# Patient Record
Sex: Male | Born: 1940
Health system: Southern US, Community
[De-identification: ages and names within clinical notes are randomized; demographics above are authoritative.]

## PROBLEM LIST (undated history)

## (undated) DIAGNOSIS — M5126 Other intervertebral disc displacement, lumbar region: Secondary | ICD-10-CM

## (undated) DIAGNOSIS — I499 Cardiac arrhythmia, unspecified: Secondary | ICD-10-CM

## (undated) DIAGNOSIS — H532 Diplopia: Secondary | ICD-10-CM

## (undated) DIAGNOSIS — Z8546 Personal history of malignant neoplasm of prostate: Secondary | ICD-10-CM

## (undated) DIAGNOSIS — M51369 Other intervertebral disc degeneration, lumbar region without mention of lumbar back pain or lower extremity pain: Secondary | ICD-10-CM

## (undated) DIAGNOSIS — G4730 Sleep apnea, unspecified: Secondary | ICD-10-CM

## (undated) DIAGNOSIS — I10 Essential (primary) hypertension: Secondary | ICD-10-CM

## (undated) DIAGNOSIS — N200 Calculus of kidney: Secondary | ICD-10-CM

## (undated) DIAGNOSIS — M545 Low back pain, unspecified: Secondary | ICD-10-CM

## (undated) DIAGNOSIS — M5136 Other intervertebral disc degeneration, lumbar region: Secondary | ICD-10-CM

## (undated) DIAGNOSIS — C801 Malignant (primary) neoplasm, unspecified: Secondary | ICD-10-CM

## (undated) DIAGNOSIS — M5030 Other cervical disc degeneration, unspecified cervical region: Secondary | ICD-10-CM

## (undated) DIAGNOSIS — F0390 Unspecified dementia without behavioral disturbance: Secondary | ICD-10-CM

## (undated) DIAGNOSIS — I4891 Unspecified atrial fibrillation: Secondary | ICD-10-CM

## (undated) DIAGNOSIS — M1990 Unspecified osteoarthritis, unspecified site: Secondary | ICD-10-CM

## (undated) DIAGNOSIS — E11319 Type 2 diabetes mellitus with unspecified diabetic retinopathy without macular edema: Secondary | ICD-10-CM

## (undated) HISTORY — PX: COLONOSCOPY: SHX174

## (undated) HISTORY — PX: BACK SURGERY: SHX140

## (undated) HISTORY — PX: LITHOTRIPSY: SUR834

## (undated) HISTORY — PX: OTHER SURGICAL HISTORY: SHX169

## (undated) HISTORY — PX: APPENDECTOMY: SHX54

## (undated) HISTORY — PX: CYSTOSCOPY: SUR368

---

## 2005-06-15 ENCOUNTER — Ambulatory Visit (HOSPITAL_COMMUNITY): Admission: RE | Admit: 2005-06-15 | Discharge: 2005-06-15 | Payer: Self-pay | Admitting: Family Medicine

## 2005-07-03 ENCOUNTER — Ambulatory Visit (HOSPITAL_BASED_OUTPATIENT_CLINIC_OR_DEPARTMENT_OTHER): Admission: RE | Admit: 2005-07-03 | Discharge: 2005-07-03 | Payer: Self-pay | Admitting: Orthopedic Surgery

## 2005-08-03 ENCOUNTER — Ambulatory Visit (HOSPITAL_COMMUNITY): Admission: RE | Admit: 2005-08-03 | Discharge: 2005-08-03 | Payer: Self-pay | Admitting: Urology

## 2006-06-11 ENCOUNTER — Emergency Department (HOSPITAL_COMMUNITY): Admission: EM | Admit: 2006-06-11 | Discharge: 2006-06-11 | Payer: Self-pay | Admitting: Emergency Medicine

## 2007-10-04 ENCOUNTER — Emergency Department (HOSPITAL_COMMUNITY): Admission: EM | Admit: 2007-10-04 | Discharge: 2007-10-04 | Payer: Self-pay | Admitting: Emergency Medicine

## 2008-06-26 ENCOUNTER — Encounter: Payer: Self-pay | Admitting: Family Medicine

## 2008-06-26 ENCOUNTER — Emergency Department (HOSPITAL_COMMUNITY): Admission: EM | Admit: 2008-06-26 | Discharge: 2008-06-26 | Payer: Self-pay | Admitting: Emergency Medicine

## 2008-07-13 ENCOUNTER — Inpatient Hospital Stay (HOSPITAL_COMMUNITY): Admission: RE | Admit: 2008-07-13 | Discharge: 2008-07-16 | Payer: Self-pay | Admitting: Neurosurgery

## 2008-07-14 HISTORY — PX: OTHER SURGICAL HISTORY: SHX169

## 2008-11-04 ENCOUNTER — Ambulatory Visit (HOSPITAL_COMMUNITY): Admission: RE | Admit: 2008-11-04 | Discharge: 2008-11-04 | Payer: Self-pay | Admitting: Neurosurgery

## 2008-11-12 HISTORY — PX: OTHER SURGICAL HISTORY: SHX169

## 2008-11-18 ENCOUNTER — Inpatient Hospital Stay (HOSPITAL_COMMUNITY): Admission: RE | Admit: 2008-11-18 | Discharge: 2008-11-20 | Payer: Self-pay | Admitting: Neurosurgery

## 2009-01-04 ENCOUNTER — Ambulatory Visit (HOSPITAL_COMMUNITY): Admission: RE | Admit: 2009-01-04 | Discharge: 2009-01-04 | Payer: Self-pay | Admitting: Ophthalmology

## 2009-01-04 HISTORY — PX: OTHER SURGICAL HISTORY: SHX169

## 2009-09-25 ENCOUNTER — Emergency Department (HOSPITAL_COMMUNITY): Admission: EM | Admit: 2009-09-25 | Discharge: 2009-09-25 | Payer: Self-pay | Admitting: Emergency Medicine

## 2009-11-29 ENCOUNTER — Ambulatory Visit (HOSPITAL_COMMUNITY): Admission: RE | Admit: 2009-11-29 | Discharge: 2009-11-29 | Payer: Self-pay | Admitting: Otolaryngology

## 2010-06-05 ENCOUNTER — Encounter: Payer: Self-pay | Admitting: Diagnostic Neuroimaging

## 2010-06-05 ENCOUNTER — Encounter: Payer: Self-pay | Admitting: Family Medicine

## 2010-07-30 LAB — CREATININE, SERUM
Creatinine, Ser: 1.26 mg/dL (ref 0.4–1.5)
GFR calc Af Amer: >60 mL/min (ref >60)
GFR calc non Af Amer: 57 mL/min — ABNORMAL LOW (ref >60)

## 2010-07-30 LAB — BUN: BUN: 20 mg/dL (ref 6–23)

## 2010-08-02 LAB — DIFFERENTIAL
Basophils Absolute: 0 10*3/uL (ref 0.0–0.1)
Monocytes Absolute: 0.6 10*3/uL (ref 0.1–1.0)
Monocytes Relative: 8 % (ref 3–12)
Neutrophils Relative %: 68 % (ref 43–77)

## 2010-08-02 LAB — CBC
HCT: 37.5 % — ABNORMAL LOW (ref 39.0–52.0)
Hemoglobin: 13.2 g/dL (ref 13.0–17.0)
MCHC: 35.2 g/dL (ref 30.0–36.0)
Platelets: 228 10*3/uL (ref 150–400)
RBC: 4.22 MIL/uL (ref 4.22–5.81)
RDW: 12.9 % (ref 11.5–15.5)

## 2010-08-02 LAB — BASIC METABOLIC PANEL
GFR calc Af Amer: >60 mL/min (ref >60)
GFR calc non Af Amer: >60 mL/min (ref >60)
Glucose, Bld: 233 mg/dL — ABNORMAL HIGH (ref 70–99)
Potassium: 3.8 mEq/L (ref 3.5–5.1)

## 2010-08-20 LAB — URINALYSIS, ROUTINE W REFLEX MICROSCOPIC
Ketones, ur: NEGATIVE mg/dL
Nitrite: NEGATIVE
Protein, ur: NEGATIVE mg/dL
Specific Gravity, Urine: 1.025 (ref 1.005–1.030)
Urobilinogen, UA: 0.2 mg/dL (ref 0.0–1.0)

## 2010-08-20 LAB — GLUCOSE, CAPILLARY
Glucose-Capillary: 102 mg/dL — ABNORMAL HIGH (ref 70–99)
Glucose-Capillary: 117 mg/dL — ABNORMAL HIGH (ref 70–99)

## 2010-08-20 LAB — COMPREHENSIVE METABOLIC PANEL
AST: 21 U/L (ref 0–37)
Albumin: 3.7 g/dL (ref 3.5–5.2)
Calcium: 9.5 mg/dL (ref 8.4–10.5)
Chloride: 104 mEq/L (ref 96–112)
GFR calc Af Amer: >60 mL/min (ref >60)
GFR calc non Af Amer: 60 mL/min — ABNORMAL LOW (ref >60)
Glucose, Bld: 115 mg/dL — ABNORMAL HIGH (ref 70–99)
Potassium: 4.2 mEq/L (ref 3.5–5.1)
Total Bilirubin: 0.5 mg/dL (ref 0.3–1.2)

## 2010-08-20 LAB — CBC
MCHC: 34.5 g/dL (ref 30.0–36.0)
RDW: 12.8 % (ref 11.5–15.5)

## 2010-08-21 LAB — POCT I-STAT GLUCOSE
Glucose, Bld: 94 mg/dL (ref 70–99)
Glucose, Bld: 99 mg/dL (ref 70–99)
Operator id: 156951
Operator id: 156951

## 2010-08-21 LAB — GLUCOSE, CAPILLARY
Glucose-Capillary: 131 mg/dL — ABNORMAL HIGH (ref 70–99)
Glucose-Capillary: 133 mg/dL — ABNORMAL HIGH (ref 70–99)
Glucose-Capillary: 144 mg/dL — ABNORMAL HIGH (ref 70–99)
Glucose-Capillary: 146 mg/dL — ABNORMAL HIGH (ref 70–99)
Glucose-Capillary: 147 mg/dL — ABNORMAL HIGH (ref 70–99)
Glucose-Capillary: 163 mg/dL — ABNORMAL HIGH (ref 70–99)
Glucose-Capillary: 199 mg/dL — ABNORMAL HIGH (ref 70–99)
Glucose-Capillary: 209 mg/dL — ABNORMAL HIGH (ref 70–99)

## 2010-08-21 LAB — BASIC METABOLIC PANEL
BUN: 24 mg/dL — ABNORMAL HIGH (ref 6–23)
CO2: 29 mEq/L (ref 19–32)
Calcium: 9.2 mg/dL (ref 8.4–10.5)
Chloride: 104 mEq/L (ref 96–112)
Creatinine, Ser: 1.37 mg/dL (ref 0.4–1.5)
GFR calc Af Amer: >60 mL/min (ref >60)
GFR calc non Af Amer: 52 mL/min — ABNORMAL LOW (ref >60)
Glucose, Bld: 240 mg/dL — ABNORMAL HIGH (ref 70–99)
Potassium: 4.7 mEq/L (ref 3.5–5.1)
Sodium: 140 mEq/L (ref 135–145)

## 2010-08-21 LAB — TYPE AND SCREEN
ABO/RH(D): O POS
Antibody Screen: NEGATIVE

## 2010-08-21 LAB — CBC
HCT: 36.4 % — ABNORMAL LOW (ref 39.0–52.0)
Hemoglobin: 12.5 g/dL — ABNORMAL LOW (ref 13.0–17.0)
MCHC: 34.4 g/dL (ref 30.0–36.0)
MCV: 91.5 fL (ref 78.0–100.0)
Platelets: 213 10*3/uL (ref 150–400)
RBC: 3.97 MIL/uL — ABNORMAL LOW (ref 4.22–5.81)
RDW: 12.5 % (ref 11.5–15.5)
WBC: 7.7 10*3/uL (ref 4.0–10.5)

## 2010-08-21 LAB — ABO/RH: ABO/RH(D): O POS

## 2010-08-22 LAB — CREATININE, SERUM
Creatinine, Ser: 1.15 mg/dL (ref 0.4–1.5)
GFR calc Af Amer: >60 mL/min (ref >60)
GFR calc non Af Amer: >60 mL/min (ref >60)

## 2010-08-22 LAB — BUN: BUN: 19 mg/dL (ref 6–23)

## 2010-08-25 LAB — COMPREHENSIVE METABOLIC PANEL
ALT: 17 U/L (ref 0–53)
AST: 20 U/L (ref 0–37)
Albumin: 3.9 g/dL (ref 3.5–5.2)
Alkaline Phosphatase: 52 U/L (ref 39–117)
BUN: 14 mg/dL (ref 6–23)
CO2: 27 mEq/L (ref 19–32)
Calcium: 9.2 mg/dL (ref 8.4–10.5)
Chloride: 103 mEq/L (ref 96–112)
Creatinine, Ser: 0.99 mg/dL (ref 0.4–1.5)
GFR calc Af Amer: >60 mL/min (ref >60)
GFR calc non Af Amer: >60 mL/min (ref >60)
Glucose, Bld: 177 mg/dL — ABNORMAL HIGH (ref 70–99)
Potassium: 3.7 mEq/L (ref 3.5–5.1)
Sodium: 139 mEq/L (ref 135–145)
Total Bilirubin: 0.4 mg/dL (ref 0.3–1.2)
Total Protein: 6.9 g/dL (ref 6.0–8.3)

## 2010-08-25 LAB — GLUCOSE, CAPILLARY
Glucose-Capillary: 100 mg/dL — ABNORMAL HIGH (ref 70–99)
Glucose-Capillary: 117 mg/dL — ABNORMAL HIGH (ref 70–99)
Glucose-Capillary: 122 mg/dL — ABNORMAL HIGH (ref 70–99)
Glucose-Capillary: 123 mg/dL — ABNORMAL HIGH (ref 70–99)
Glucose-Capillary: 127 mg/dL — ABNORMAL HIGH (ref 70–99)
Glucose-Capillary: 178 mg/dL — ABNORMAL HIGH (ref 70–99)
Glucose-Capillary: 179 mg/dL — ABNORMAL HIGH (ref 70–99)
Glucose-Capillary: 232 mg/dL — ABNORMAL HIGH (ref 70–99)
Glucose-Capillary: 262 mg/dL — ABNORMAL HIGH (ref 70–99)
Glucose-Capillary: 326 mg/dL — ABNORMAL HIGH (ref 70–99)
Glucose-Capillary: 331 mg/dL — ABNORMAL HIGH (ref 70–99)
Glucose-Capillary: 83 mg/dL (ref 70–99)

## 2010-08-25 LAB — CBC
HCT: 37.5 % — ABNORMAL LOW (ref 39.0–52.0)
Hemoglobin: 12.9 g/dL — ABNORMAL LOW (ref 13.0–17.0)
MCHC: 34.3 g/dL (ref 30.0–36.0)
MCV: 90.6 fL (ref 78.0–100.0)
Platelets: 216 10*3/uL (ref 150–400)
RBC: 4.14 MIL/uL — ABNORMAL LOW (ref 4.22–5.81)
RDW: 12.7 % (ref 11.5–15.5)
WBC: 7 10*3/uL (ref 4.0–10.5)

## 2010-08-30 LAB — GLUCOSE, CAPILLARY: Glucose-Capillary: 119 mg/dL — ABNORMAL HIGH (ref 70–99)

## 2010-09-27 NOTE — Discharge Summary (Signed)
Jonathan Hensley, MISH NO.:  [id number]   MEDICAL RECORD NO.:  [id number]          PATIENT TYPE:  INP   LOCATION:  3006                         FACILITY:  [facility]   PHYSICIAN:  Hewitt Shorts, M.D.DATE OF BIRTH:  04-14-1941   DATE OF ADMISSION:  07/13/2008  DATE OF DISCHARGE:  07/16/2008                               DISCHARGE SUMMARY   This is the patient that was admitted on July 13, 2008, at 2200 for a  left L4-L5 herniated nucleus pulposus.  He underwent a left L4-L5  laminotomy and microdiskectomy that same evening.  The patient had a  small dural disruption, which has kept him in the hospital an additional  2 days for healing and safety issues.   The patient has had an uneventful hospitalization during his  recuperating period.  He has remained flat since his surgery at 2200 on  the first until today when I saw him this morning on morning rounds.  I  did allow them to raise the head of the bed to 30 degrees for 30  minutes, then 60 degrees for 30 minutes, then 90 degrees, and then sit  up with assist.  He has done well with that and then ambulating.  He  states he had nausea at first but that passed, and he has not had any  since.   He has had positive p.o. intake today.  He is voiding after his catheter  was removed this morning without difficulty.  He is able to ambulate  without assist at this time.   His wound is closed with sutures.  It is clean, dry, and intact, and  open to air at this time.  He will follow up in our office on the 10th  at 9 o'clock for suture removal with myself.   The patient will be discharged to home in good condition.   DISCHARGE DIAGNOSIS:  Left L4-L5 herniated nucleus pulposus.   DISCHARGE PRESCRIPTION:  Percocet 5/325 mg 1-2 p.o. q.4-6 h. p.r.n.  pain.  I gave him 50 with no refills.  We will follow him up on March 10  for suture removal at 0900.      Russell L. Webb Silversmith, RN      Hewitt Shorts, M.D.  Electronically Signed    RLW/MEDQ  D:  07/16/2008  T:  07/17/2008  Job:  [id number]

## 2010-09-27 NOTE — Op Note (Signed)
NAME:  Jonathan Hensley, Jonathan Hensley                ACCOUNT NO.:  [id number]   MEDICAL RECORD NO.:  [id number]          PATIENT TYPE:  AMB   LOCATION:  SDS                          FACILITY:  [facility]   PHYSICIAN:  Lanna Poche, M.D. DATE OF BIRTH:  Feb 18, 1941   DATE OF PROCEDURE:  01/04/2009  DATE OF DISCHARGE:  01/04/2009                               OPERATIVE REPORT   PREOPERATIVE DIAGNOSIS:  Chronic rhegmatogenous retinal detachment,  right eye.   POSTOPERATIVE DIAGNOSIS:  Chronic rhegmatogenous retinal detachment,  right eye.   PROCEDURE:  Scleral buckle, laser photocoagulation, and anterior chamber  paracentesis, right eye.   SURGEON.:  Lanna Poche, MD   ANESTHESIA:  General endotracheal.   ASSISTANT:  Clayton Bibles, OA   ESTIMATED BLOOD LOSS:  Less than 1 mL.   COMPLICATIONS:  None.   OPERATIVE NOTE:  The patient was taken to the operating room.  After  induction of general anesthesia, the right eye was prepped and draped in  usual fashion.  The lid speculum was introduced in conjunctival fornix  posterior to the limbus.  The four rectus muscles isolated and  __________ on suture of 2-0 silk and the four quadrants were inspected  and found to be free of defects.  The lid speculum was left in and  inspection with ophthalmoscope and scleral depression revealed there to  be a chronic peripheral retinal detachment extending from approximately  8 o'clock to 12:30.  There were atrophic holes at 9 and 9:30, there was  a large peripheral horseshoe tear approximately 10 and with some  additional atrophic holes at 12.  The holes were all medial and  posterior to the ora serrata.  The lid speculum was then removed and the  lid sutures of 4-0 silk were placed.  The 240 band then passed 360  degrees underneath the recti muscles and joined the superonasal quadrant  at 270 sleeve.  277 buckle was brought on to the table, cut to the  appropriate length, and passed underneath the lateral and  superior recti  muscles.  The buckle and hardware anchored with sutures of 5-0  Mersilene, the superonasal, inferonasal, and inferotemporal quadrants.  The supratemporal sclerotomy had two 4-0 Mersilene mattress sutures  placed.  The inside of the eye was inspected.  There was good support of  the retinal breaks.  There was good laser reaction where scleral  depression and indirect laser was used to perform laser photocoagulation  around the areas of the retinal breaks.  The knots were then tied up and  rotated posteriorly.  Inspection showed there to be good support of the  buckle with additional indentation achieved by tightening the band  slightly before the band was trimmed.  The ophthalmic artery was  visualized and found to be patent, but with small amounts of pulsation.  The conjunctiva was then drawn and reapproximated with interrupted  running sutures of 6-0 plain gut after all the traction sutures were  removed.  Inspection internally still showed there to be some pulsation  of the ophthalmic artery, so anterior chamber paracentesis was performed  removing  approximately 0.2 mL of fluid through the 30-gauge needle at 12  o'clock position over the iris.  The eye was reinspected and found to be  patent artery.  The subconjunctival space was then irrigated with 0.75%  Marcaine followed by mixed antibiotic solution.  The subconjunctival  space base was then injected with 100 mg of  ceftazidime and 10 mg of Decadron.  Lid speculum was then removed and  mixed antibiotic was applied to the surface of the eye.  Eye patch and  shield was then placed over the patient's right eye.  The patient  tolerated the procedure well.           ______________________________  Lanna Poche, M.D.     JTH/MEDQ  D:  01/04/2009  T:  01/05/2009  Job:  [id number]

## 2010-09-27 NOTE — Op Note (Signed)
Jonathan Hensley, Jonathan Hensley                ACCOUNT NO.:  [id number]   MEDICAL RECORD NO.:  [id number]          PATIENT TYPE:  INP   LOCATION:  3014                         FACILITY:  [facility]   PHYSICIAN:  Hewitt Shorts, M.D.DATE OF BIRTH:  12-25-1940   DATE OF PROCEDURE:  11/18/2008  DATE OF DISCHARGE:                               OPERATIVE REPORT   PREOPERATIVE DIAGNOSES:  Recurrent left L4-5 lumbar disk herniation,  lumbar degenerative disk disease, lumbar spondylosis, lumbar  radiculopathy and lumbar instability.   POSTOPERATIVE DIAGNOSES:  Recurrent left L4-5 lumbar disk herniation,  lumbar degenerative disk disease, lumbar spondylosis, lumbar  radiculopathy and lumbar instability.   PROCEDURE:  Bilateral L4-5 lumbar decompression including bilateral  laminotomy, facetectomy, foraminotomy, and microdiskectomy with  microdissection with decompression beyond that required for interbody  arthrodesis and bilateral L4-5 posterior lumbar interbody fusion with  AVS PEEK interbody implants with mosaic with bone marrow aspirate and  bilateral L4-5 posterolateral arthrodesis with radius posterior  instrumentation and mosaic with bone marrow aspirates.   SURGEON:  Hewitt Shorts, MD   ASSISTANT:  Lauree Chandler, NP and Clydene Fake, MD   ANESTHESIA:  General endotracheal.   INDICATIONS:  The patient is a 70 year old man who is 4 months status  post left L4-5 lumbar laminotomy and microdiskectomy.  He did well  initially, but suffered a large recurrent disk herniation with recurrent  radiculopathy and because of the early recurrence and very large disk  herniation, a decision was made to proceed with both decompression and  stabilization.   Of note, the patient did have a small dural rent at the time of his  first surgery that was patched over with DuraSeal and during the  surgery, no CSF leakage was encountered.   PROCEDURE:  The patient was brought to the operating room and  placed  under general endotracheal anesthesia.  The patient was turned to a  prone position.  Lumbar region was prepped with Betadine soap and  solution, and draped in a sterile fashion.  The midline was infiltrated  with local anesthetic with epinephrine and then midline incision was  made through the previous midline incision extending both rostrally and  caudally.  Dissection was carried down through the subcutaneous tissue  to the lumbar fascia.  Bipolar cautery and electrocautery were used to  maintain hemostasis.  Dissection was first carried out and incised in  the fascia on the right side dissecting the paraspinal muscles from the  spinous process and lamina using a subperiosteal technique.  We then  approached from the left side, area of the scar at the L4-5 level was  noted and careful dissection was carried down through that area.  The  paraspinal muscles were dissected in periosteal fashion where good body  surface was noted.  A Self-retaining retractors were placed, and x-ray  taken to confirm localization at the L4-5 level.   Decompression performed using magnification using microdissection and  microsurgical technique.  We first performed a laminotomy, facetectomy  on the right side using the high speed drill and Kerrison punches.  Ligamentum flavum was carefully  removed and the thecal sac and exiting  L5 nerve root were identified.  We then exposed on the left side second-  degree scar tissues from the margins of the laminotomy.  The laminotomy  was then extended rostrally and laterally and medial facetectomy was  performed as well.   We were able to carefully dissect the left lateral epidural space  exposing the lateral margins of the dura, and the large disk herniation.  We began to visualize the disk herniation and began to mobilize it and  the disk herniation itself was very large and was removed in a piecemeal  fashion removing several very large fragments and then  removing small  fragments as well.  All loose fragments of disk material that herniated  within the epidural space were removed with good decompression of the  thecal sac and exiting left L5 nerve root.  No evidence of CSF leakage  was noted nor evidence of dural tear or rent.   We entered into the disk space and continued the diskectomy using a  variety of microcurettes and pituitary rongeurs, and a thorough  diskectomy was performed with removal of all disk fragments and disk  material.   We then approached from the right side coagulating epidural veins over  the annulus and dividing them, and then incising the annulus and  entering into the disk space and continuing the diskectomy again using a  variety of  microcurettes and pituitary rongeurs.   We then prepared the endplates of the vertebral body surfaces  bilaterally using paddle curettes to remove the cartilaginous surface  down to a good bony surface.  We measured the height of the  intravertebral disk space and selected two 10 mm x 25 mm x 4 degree AVS  PEEK implants.   A C-arm fluoroscope was then draped and brought into the field and  guided [place] in probing the right L5 pedicle.  We were able to aspirate  bone marrow aspirate which was injected over a 15-mL strip of mosaic.   We then packed each of the PEEK implants with mosaic with bone marrow  aspirate.  Once these were prepared, we placed the first implant on the  right side carefully retracting the thecal sac and nerve root  immediately.  The  implant was placed in the intravertebral disk space  and countersunk.  We then approached from the left side, packed in  midline with the mosaic with bone marrow aspirate and then placed a  second implant from the left side.  Again, carefully retracting the  thecal sac and nerve root and again the implant with countersunk.   We then packed additional mosaic with bone marrow aspirate lateral to  the each of the implants.  Once  the interbody arthrodesis was completed,  we proceeded with posterolateral arthrodesis.   Again, using C-arm fluoroscopic guidance, each of the pedicles were  probed bilaterally at L4 as well as on the left side at L5.  All 4 of  the pedicle probes were examined with the ball probe, good bony surfaces  were noted.  No cutouts were found.  Each of the pedicles was tapped to  the 5.25 mm tap, and again examined with a ball probe.  Again, good  threading was noted and good bony surfaces without cutouts were noted.  We then placed 45 mm x 5.75 mm polyaxial screws with C-arm fluoroscopic  guidance.   Once all 4 screws were placed, we selected prelordosed rods using a 40-  mm rod on the left and 35-mm rod on the right.  Once the rods were in  place, locking caps were placed.  Once all 4 locking caps were in place,  final tightening was done against the counter torque.  We then packed  the remaining mosaic with bone marrow aspirate in the lateral gutters,  and facet joints bilaterally and then proceeded with closure.  The wound  had been irrigated numerous times during the procedure with saline  solution and bacitracin solution.  The deep fascia was closed with  interrupted undyed 1-Vicryl sutures.  The Scarpa fascia was closed with  interrupted undyed 1-Vicryl sutures.  The subcutaneous and subcuticular  were closed with interrupted inverted 2-0 undyed Vicryl sutures and the  skin was approximated with Dermabond.  The wound was dressed with  Adaptic, sterile gauze, and Hyperfix.  The procedure was tolerated well.  Estimated blood loss was 300 mL.  We did use a Cell Saver during the  procedure, but the Cell Saver  technician felt that there was insufficient blood loss to process the  collected blood.  Sponge and needle count were correct.  Following  surgery, the patient was turned back to the supine position to be  reversed from the anesthetic, extubated, and transferred to the recovery   room for further care.      Hewitt Shorts, M.D.  Electronically Signed     RWN/MEDQ  D:  11/18/2008  T:  11/19/2008  Job:  [id number]

## 2010-09-27 NOTE — H&P (Signed)
Jonathan Hensley, Jonathan Hensley                ACCOUNT NO.:  [id number]   MEDICAL RECORD NO.:  [id number]          PATIENT TYPE:  INP   LOCATION:  3006                         FACILITY:  [facility]   PHYSICIAN:  Hewitt Shorts, M.D.DATE OF BIRTH:  [birth year]   DATE OF ADMISSION:  07/13/2008  DATE OF DISCHARGE:  07/16/2008                              HISTORY & PHYSICAL   HISTORY OF PRESENT ILLNESS:  The patient is a 70 year old man who was  seen for evaluation of acute left lumbar radiculopathy secondary to a  large left L4-L5 lumbar disk herniation.  The patient says this may have  begun last month, following shoveling of snow.  An MRI was obtained,  which revealed a large disk herniation and neurosurgical consultation  was requested.   He was treated with Naproxen and Flexeril as well as a prednisone  Dosepak, but he did not fill the prescription for the prednisone, and he  continues to have disabling pain.  He finds the pain is worse as the day  goes on.  It is eased by laying down but because of the continued pain,  he was seen and admitted for further treatment.   PAST MEDICAL HISTORY:  Notable for history of diabetes for 12 years,  history of hypertension for 5 years, and also a history of  nephrolithiasis.  No history of myocardial infarction, cancer, stroke,  peptic ulcer disease, or lung disease.   PREVIOUS SURGERY:  Rotator cuff surgery in February 2006.   ALLERGIES:  He denies allergies to any medications.   MEDICATIONS:  At the time of admission includes:  1. Metformin 1000 mg b.i.d.  2. Glipizide 10 mg b.i.d.  3. Benazepril 40 mg daily.  4. Vytorin 10/40 daily.  5. Amlodipine 5 mg daily.  6. Toprol-XL 50 mg daily.  7. Byetta injection 10 mcg b.i.d.  8. Humulin N 20 units nightly.   FAMILY HISTORY:  Parents have passed, mother with congestive heart  failure, father had a heart attack.   SOCIAL HISTORY:  The patient is retired.  He is married.  He does not  smoke, drink  alcoholic beverages, or have a history of substance abuse.   REVIEW OF SYSTEMS:  Notable for those as described in the history of  present illness and past medical history.  Otherwise, 14-point review of  systems is otherwise unremarkable.   PHYSICAL EXAMINATION:  GENERAL:  The patient is a well-developed and  well-nourished white male in no acute distress.  VITAL SIGNS:  Temperature 97.4, pulse 61, blood pressure 148/78,  respiratory rate 18, height 5 feet 11 inches, and weight 227 pounds.  LUNGS:  Clear to auscultation.  He has symmetric respiratory excursion.  HEART:  Has a regular rate and rhythm.  No S1, S2.  There is no murmur.  EXTREMITIES:  No clubbing, cyanosis, or edema.  MUSCULOSKELETAL:  He has limited mobility due to pain.  Flexion is  limited to about 60 degrees and extension limited to about 10 degrees.  Mild tenderness to palpation over the left paralumbar region, none over  the right paralumbar region, none over  the lumbar spinous process.  Straight leg raising is positive on the left and negative on the right.  NEUROLOGIC:  5/5 strength in the lower extremities to include iliopsoas,  quadriceps, dorsiflexors, extensor hallucis longus, and plantar flexion  bilaterally.  Sensation is intact to pinprick in the upper and lower  extremities.  Reflexes are absent in the biceps, brachioradialis,  triceps, quadriceps, and gastrocnemius.  They are symmetrical  bilaterally.  Toes are downgoing bilaterally.  He has a normal gait and  stance.   IMPRESSION:  Left lumbar radiculopathy.  Despite rest and medications,  this has not improved over the past 3 weeks.  He has good strength with  sensation, but persistent disruptive pain that has affected his day-to-  day life.  He has a large left L4-5 lumbar disk herniation, superimposed  upon underlying degenerative disk disease and spondylosis.  He has  comorbidities with diabetes and hypertension.   PLAN:  The patient will be  admitted for a left L4-5 lumbar laminotomy  and microdiskectomy.  We have discussed the nature of his condition, the  nature of surgery, alternatives to surgery, typical length of surgery,  hospital stay, overall preparation and limitations postoperatively; and  risks that include risks of infection, bleeding, possibly need for  transfusion, the risk of nerve dysfunction with pain, weakness,  numbness, paresthesias, the risk of dural tear, CSF leaks, possible need  for further surgery, the risk of recurrent disk herniation, possible  need for further surgery, and then increased risk of myocardial  infarction, stroke, pneumonia, and death.  Understanding all of these,  he does wish to have his surgery and is admitted for such.      Hewitt Shorts, M.D.  Electronically Signed     RWN/MEDQ  D:  07/16/2008  T:  07/17/2008  Job:  [id number]

## 2010-09-27 NOTE — Op Note (Signed)
[name], [name] NO.:  [id number]   MEDICAL RECORD NO.:  [id number]          PATIENT TYPE:  INP   LOCATION:  3006                         FACILITY:  [facility]   PHYSICIAN:  Hewitt Shorts, M.D.DATE OF BIRTH:  1941-02-10   DATE OF PROCEDURE:  DATE OF DISCHARGE:                               OPERATIVE REPORT   PREOPERATIVE DIAGNOSES:  1. Left L4-L5 lumbar disc herniation.  2. Lumbar degenerative disc disease.  3. Lumbar spondylosis.  4. Lumbar radiculopathy.   POSTOPERATIVE DIAGNOSES:  1. Left L4-L5 lumbar disc herniation.  2. Lumbar degenerative disc disease.  3. Lumbar spondylosis.  4. Lumbar radiculopathy.   PROCEDURE:  Left L4-L5 lumbar laminotomy and microdiskectomy with  microdissection.   SURGEON:  Hewitt Shorts, M.D.   ASSISTANT:  Danae Orleans. Venetia Maxon, MD.   ANESTHESIA:  General endotracheal.   INDICATIONS:  The patient is a 70 year old man who presented with a left  lumbar radiculopathy that was found by MRI scan to be due to a large  left L4-L5 lumbar disc herniation superimposed upon underlying  degenerative changes.  Decision was made to proceed with laminotomy and  microdiscectomy.   DESCRIPTION OF OPERATIVE PROCEDURE:  The patient was brought to the  operating room and placed under general endotracheal anesthesia. The  patient was turned to a prone position.  Lumbar region was prepped with  Betadine soap and solution and draped in a sterile fashion. The midline  was infiltrated with local anesthetic with epinephrine and a midline  incision made and carried down through the subcutaneous tissue.  Bipolar  cautery and electrocautery was used to maintain hemostasis.  Dissection  was carried down to the lumbar fascia, which was incised on the left  side of the midline and the paraspinal muscles were dissected from the  spinous process and lamina in a subperiosteal fashion.  The L4-L5  intralaminar space was identified and additional  x-rays were taken to  confirm the localization.  The microscope was then draped and brought in  the field to provide additional navigation, illumination, and  visualization.  The remainder of the decompression was performed using  microdissection and microsurgical technique.   Laminotomy was performed using the X-Max drill and Kerrison punches and  the ligamentum flavum was removed.  We then began to dissect around the  lateral margin of thecal sac and nerve root.  The disc material that was  herniated was somewhat adherent to the lateral aspect of the left L5  nerve root as we began to mobilize the disc fragment, a small 1-2 mm  rent in the dura occurred in the lateral aspect of the nerve root.  We  continued to proceed with the diskectomy and dissecting the disc  herniation and from the surrounding epidural tissues and then we were  able to gradually mobilize the thecal sac and nerve root immediately.  Additional amounts of the herniated disc were visualized and removed,  and we continued the diskectomy entering into the disc space, incising  the angles to open the exposure into the disc space and continue with a  third  diskectomy using a variety of pituitary rongeurs and  microcurettes.  We then turned our attention back to the epidural space  for their additional fragments that were mobilized and removed and in  the end, good decompression of the thecal sac and nerve root was  achieved and all loose fragments of disc material removed.   We then turned our attention to the small rent in the dura.  There was  very slight CSF leak and the consensus of Dr. Venetia Maxon who assisted me with  the procedure and myself was that it was likely to create a greater  opening trying to suture this closed and therefore, we placed a layer of  DuraSeal over the small rent and against the Valsalva, no CSF leakage  was seen.  We then proceeded with closure.  The deep fascia was closed  with interrupted  undyed #1 Vicryl sutures.  Scarpa fascia was closed  with interrupted undyed #1 Vicryl sutures.  The subcutaneous and  subcuticular were closed with interrupted inverted 2-0 undyed Vicryl  sutures and the skin edges were closed with a running locking horizontal  mattress suture.  The wound was dressed with Adaptic, sterile gauze.  The procedure was tolerated well.  The estimated blood loss was 25 mL.  Sponge count was correct.  Following surgery, the patient was turned  back to the supine position.  A Foley catheter was placed by the nursing  staff, and the patient was then to be reversed from the anesthetic,  extubated, and transferred to the recovery room for further care.      Hewitt Shorts, M.D.  Electronically Signed     RWN/MEDQ  D:  07/13/2008  T:  07/14/2008  Job:  [id number]

## 2010-09-30 NOTE — Op Note (Signed)
NAME:  NORMON, PETTIJOHN                ACCOUNT NO.:  [id number]   MEDICAL RECORD NO.:  [id number]          PATIENT TYPE:  AMB   LOCATION:  DSC                          FACILITY:  [facility]   PHYSICIAN:  Robert A. Thurston Hole, M.D. DATE OF BIRTH:  [birth year]   DATE OF PROCEDURE:  07/03/2005  DATE OF DISCHARGE:                                 OPERATIVE REPORT   PREOPERATIVE DIAGNOSES:  1.  Right shoulder partial rotator cuff tear and partial labrum tear.  2.  Right shoulder adhesive capsulitis.  3.  Right shoulder impingement.  4.  Right shoulder AC joint spurring and DJD.   POSTOPERATIVE DIAGNOSES:  1.  Right shoulder partial rotator cuff tear and partial labrum tear.  2.  Right shoulder adhesive capsulitis.  3.  Right shoulder impingement.  4.  Right shoulder AC joint spurring and DJD.   PROCEDURE:  1.  Right shoulder EUA followed by manipulation.  2.  Right shoulder arthroscopic debridement, partial rotator cuff tear      partial labrum tear.  3.  Right shoulder lysis of adhesions.  4.  Right shoulder subacromial decompression.  5.  Right shoulder distal clavicle excision.   SURGEON:  Elana Alm. Thurston Hole, M.D.   ANESTHESIA:  General.   OPERATIVE TIME:  45 minutes.   COMPLICATIONS:  None.   INDICATIONS FOR PROCEDURE:  Mr. Baby is a 32 old gentleman who has had  significant right shoulder pain for the past 2-1/2 months secondary to a  twisting pulling injury. Significant pain with exam and MRI documenting  partial rotator cuff tear with early adhesive capsulitis. He is now to  undergo arthroscopy due to failed conservative care.   DESCRIPTION:  Mr. Rayos was brought to operating room on July 03, 2005  after interscalene block was placed in holding room by anesthesia. He is  placed operating table supine position. His right shoulder was examined.  Initially under general anesthesia he had forward flexion of 160, abduction  150, internal-external rotation of 50 degrees. A gentle  manipulation was  carried out breaking up adhesions and improving forward flexion to 170,  abduction to 170 internal-external rotation of 85 degrees. His shoulder  remained stable to ligamentous exam. He received Ancef 1 grams IV  preoperatively for prophylaxis. He was then placed in beach chair position.  The shoulder and arm was prepped using sterile DuraPrep and draped using  sterile technique. Originally through a posterior arthroscopic portal the  arthroscope with a pump attached was placed and through an anterior portal  an arthroscopic probe was placed. On initial inspection the articular  cartilage in the glenohumeral joint was intact. He did have a hemarthrosis  secondary to the manipulation and this was evacuated. The anterior labrum  showed partial tearing 25% which was debrided. Inferior labrum and anterior  inferior glenohumeral ligament complex was intact. Superior labrum and  biceps tendon anchor was intact and biceps tendon was intact. Posterior  labrum showed partial tearing 25% which was debrided. The rotator cuff  showed a partial tear of the subscapularis 50% which was debrided. A partial  tear of the  supraspinatus 25-30% which was debrided. The rest of the rotator  cuff was intact. The inferior capsular recess showed moderate hemorrhagic  synovitis but no other pathology. After this was done the subacromial space  was entered and a lateral arthroscopic portal was made. Large amount of  bursitis was resected. The rotator cuff was frayed and inflamed on the  bursal surface but no further tearing was noted. Impingement was noted. The  subacromial decompression was carried out as well as CA ligament release  resecting 6 to 8 mm of the undersurface of the anterior, anterolateral,  anteromedial acromion. AC joint showed significant spurring and degenerative  changes and the distal 5 to 6 mm of clavicle was resected with a 6 mm bur.  After this was done, the shoulder could be  brought through full range of  motion with no impingement on the rotator cuff. At this point it was felt  that all pathology been satisfactorily addressed. The instruments removed.  Portals closed with 3-0 nylon suture. Sterile dressings were applied after  the joint was injected with 80 milligrams Depo-Medrol and 10 mL of 0.2%  Marcaine with epinephrine and a sling applied and the patient awakened and  taken to recovery in stable condition.   FOLLOW-UP CARE:  Mr. Burruss will be followed outpatient on Percocet and  Naprosyn.  See him back in the office in a week for sutures out and follow-  up with early physical therapy.      Robert A. Thurston Hole, M.D.  Electronically Signed     RAW/MEDQ  D:  07/03/2005  T:  07/03/2005  Job:  [id number]

## 2010-09-30 NOTE — Consult Note (Signed)
NAME:  Jonathan Hensley, Jonathan Hensley                ACCOUNT NO.:  [id number]   MEDICAL RECORD NO.:  [id number]          PATIENT TYPE:  EMS   LOCATION:  ED                            FACILITY:  [facility]   PHYSICIAN:  Artist Pais. Weingold, M.D.DATE OF BIRTH:  [birth year]   DATE OF CONSULTATION:  DATE OF DISCHARGE:                                 CONSULTATION   ER CONSULT:   PHYSICIAN REQUESTING CONSULTATION:  Dr. Margretta Ditty.   This is a transfer from [facility].   HISTORY OF PRESENT ILLNESS:  Jonathan Hensley is a very pleasant 70 year old  right-hand dominant male who presents today with a left ring finger open  mal deformity status post a table saw injury as well as left thumb and  left index finger lacerations.  He is an otherwise healthy 70 year old  male with no known drug allergies.   He is currently on oral antihyperglycemics for non-insulin-dependent  diabetes mellitus, Vytorin, several other medications for blood pressure  and diabetes.   No recent hospitalizations or surgery.  He is under the care of the Dr.  Nicki Guadalajara for cardiology and no other, again, significant past  medical or surgical history noted recently.   EXAM:  GENERAL:  Well-developed , well-nourished male, pleasant, alert,  and oriented x3.  EXAMINATION OF HIS FINGERS:  He has an obvious open  mal deformity with extensor tendon involvement and involvement of the  ring finger.  He has a laceration to the fracture of the nail plate of  the thumb and also a soft tissue laceration volarly on the index finger.   X-rays show evidence of some foreign material over the distal  interphalangeal joint and an open mal deformity ringer finger on the  left.   The patient was given 2% plain lidocaine digital sheath block, the  ringer finger.  He was prepped and draped in the usual sterile fashion.  His open wound was debrided.  His extensor tendon was repaired with 4-0  Vicryl, the skin with 4-0 nylon, and a small portion of the  nailbed was  also repaired using 5-0 Vicryl.  This was then dressed with Xeroform 4 x  4 and a Coban compression wrap.  The thumb and index finger lacerations  were carefully debrided and dressed with Xeroform 4 x 4 and Coban wrap  as well.   The patient was discharged from the emergency department with  doxycycline 100 mg one p.o. b.i.d. for antibiotic prophylaxis, Vicodin  for pain, and he is to follow up in my office on this Thursday.  He is  to call me sooner if there is any significant change as far as fever,  chills, et Karie Soda.      Artist Pais Mina Marble, M.D.  Electronically Signed     MAW/MEDQ  D:  06/11/2006  T:  06/12/2006  Job:  [id number]

## 2010-10-24 ENCOUNTER — Other Ambulatory Visit: Payer: Self-pay | Admitting: Urology

## 2010-10-24 ENCOUNTER — Ambulatory Visit (HOSPITAL_BASED_OUTPATIENT_CLINIC_OR_DEPARTMENT_OTHER)
Admission: RE | Admit: 2010-10-24 | Discharge: 2010-10-24 | Disposition: A | Discharge status: Home / Self Care | Payer: Medicare Other | Source: Ambulatory Visit | Attending: Urology | Admitting: Urology

## 2010-10-24 DIAGNOSIS — C61 Malignant neoplasm of prostate: Secondary | ICD-10-CM | POA: Insufficient documentation

## 2010-10-24 DIAGNOSIS — N200 Calculus of kidney: Secondary | ICD-10-CM | POA: Insufficient documentation

## 2010-10-24 DIAGNOSIS — Z01812 Encounter for preprocedural laboratory examination: Secondary | ICD-10-CM | POA: Insufficient documentation

## 2010-10-24 LAB — POCT I-STAT 4, (NA,K, GLUC, HGB,HCT)
Glucose, Bld: 168 mg/dL — ABNORMAL HIGH (ref 70–99)
HCT: 41 % (ref 39.0–52.0)
Hemoglobin: 13.9 g/dL (ref 13.0–17.0)
Potassium: 4.4 meq/L (ref 3.5–5.1)
Sodium: 137 meq/L (ref 135–145)

## 2010-10-24 LAB — GLUCOSE, CAPILLARY: Glucose-Capillary: 189 mg/dL — ABNORMAL HIGH (ref 70–99)

## 2010-10-27 ENCOUNTER — Other Ambulatory Visit: Payer: Self-pay | Admitting: Urology

## 2010-10-27 DIAGNOSIS — C61 Malignant neoplasm of prostate: Secondary | ICD-10-CM

## 2010-11-09 ENCOUNTER — Encounter (HOSPITAL_COMMUNITY)
Admission: RE | Admit: 2010-11-09 | Discharge: 2010-11-09 | Disposition: A | Discharge status: Home / Self Care | Payer: Medicare Other | Source: Ambulatory Visit | Attending: Urology | Admitting: Urology

## 2010-11-09 ENCOUNTER — Encounter (HOSPITAL_COMMUNITY): Payer: Self-pay

## 2010-11-09 DIAGNOSIS — C61 Malignant neoplasm of prostate: Secondary | ICD-10-CM | POA: Insufficient documentation

## 2010-11-09 HISTORY — DX: Malignant (primary) neoplasm, unspecified: C80.1

## 2010-11-09 MED ORDER — TECHNETIUM TC 99M MEDRONATE IV KIT
24.0000 | PACK | Freq: Once | INTRAVENOUS | Status: AC | PRN
  Administered 2010-11-09: 24 via INTRAVENOUS

## 2010-11-09 NOTE — Op Note (Signed)
  NAMEELROY, Jonathan Hensley NO.:  [id number]  MEDICAL RECORD NO.:  [id number]  LOCATION:                                 FACILITY:  PHYSICIAN:  Bertram Millard. Dahlstedt, M.D.  DATE OF BIRTH:  DATE OF PROCEDURE:  10/24/2010 DATE OF DISCHARGE:                              OPERATIVE REPORT   PREOPERATIVE DIAGNOSIS:  Nodular prostate.  POSTOPERATIVE DIAGNOSES:  Nodular prostate.  SURGICAL PROCEDURES:  Transrectal ultrasound and biopsy.  SURGEON:  Bertram Millard. Dahlstedt, M.D.  ANESTHESIA:  MAC.  COMPLICATIONS:  None.  BRIEF HISTORY:  This is a 70 year old male who recently presented for followup of renal calculi.  On exam, he had a significantly nodular prostate, although his PSA was only approximately 0.5.  Because of the significant nodularity, it was recommended that he undergo ultrasound biopsy the prostate.  The patient did not want to have this done in the office but consented to having it done under an anesthetic.  He understands the risks and complications and desires to proceed.  DESCRIPTION OF PROCEDURE:  The patient was identified in the holding area and he received preoperative IV Cipro.  He had also taken Levaquin the day before.  He was taken to the operating room where he was placed in the left lateral decubitus position with his knees gently bent.  IV sedation was administered.  After timeout, I admitted the transrectal ultrasound probe.  His glandular size was approximately 28.12 cc.  There were significant hypoechoic areas noted in the right lobe, with some mild crossover on the midline.  There was no significant hyperechoic/calcification looking areas.  The seminal vesicles appeared normal, although there was some asymmetry but the right one being slightly larger and more hypoechoic than the left.  I took 12 core biopsies using the biopsy gun from the usual biopsy template. Additionally, 1 biopsy each was sent from each seminal vesicle, they were  put in the same bottle.  At this point, the procedure was terminated.  The probe was removed.  No significant bleeding was occurred.  The patient tolerated the procedure well, was awakened and taken to PACU in stable condition.     Bertram Millard. Dahlstedt, M.D.     SMD/MEDQ  D:  10/24/2010  T:  10/24/2010  Job:  [id number]  cc:   Reather Littler, M.D. Fax: [phone]  Electronically Signed by Marcine Matar M.D. on 11/09/2010 02:42:58 PM

## 2010-11-14 ENCOUNTER — Ambulatory Visit
Admission: RE | Admit: 2010-11-14 | Discharge: 2010-11-14 | Disposition: A | Discharge status: Home / Self Care | Payer: Medicare Other | Source: Ambulatory Visit | Attending: Radiation Oncology | Admitting: Radiation Oncology

## 2010-11-14 DIAGNOSIS — C61 Malignant neoplasm of prostate: Secondary | ICD-10-CM | POA: Insufficient documentation

## 2010-11-14 DIAGNOSIS — G4730 Sleep apnea, unspecified: Secondary | ICD-10-CM | POA: Insufficient documentation

## 2010-11-14 DIAGNOSIS — E119 Type 2 diabetes mellitus without complications: Secondary | ICD-10-CM | POA: Insufficient documentation

## 2010-11-14 DIAGNOSIS — Z79899 Other long term (current) drug therapy: Secondary | ICD-10-CM | POA: Insufficient documentation

## 2010-11-14 DIAGNOSIS — I10 Essential (primary) hypertension: Secondary | ICD-10-CM | POA: Insufficient documentation

## 2010-12-20 ENCOUNTER — Ambulatory Visit: Payer: Medicare Other | Admitting: Urology

## 2011-02-08 LAB — POCT CARDIAC MARKERS: Troponin i, poc: <0.05

## 2011-02-08 LAB — DIFFERENTIAL
Basophils Absolute: 0
Basophils Relative: 0
Eosinophils Absolute: 0.3
Monocytes Absolute: 0.7
Monocytes Relative: 8
Neutrophils Relative %: 61

## 2011-02-08 LAB — CBC
MCHC: 35.4
MCV: 89.1
RBC: 4.04 — ABNORMAL LOW
RDW: 12.2

## 2011-02-08 LAB — D-DIMER, QUANTITATIVE: D-Dimer, Quant: 0.27

## 2011-02-08 LAB — BASIC METABOLIC PANEL
CO2: 28
Chloride: 107
Creatinine, Ser: 1.5
GFR calc Af Amer: 57 — ABNORMAL LOW
Glucose, Bld: 211 — ABNORMAL HIGH

## 2011-03-03 ENCOUNTER — Ambulatory Visit
Admission: RE | Admit: 2011-03-03 | Discharge: 2011-03-03 | Disposition: A | Discharge status: Home / Self Care | Payer: Medicare Other | Source: Ambulatory Visit | Attending: Urology | Admitting: Urology

## 2011-03-03 ENCOUNTER — Other Ambulatory Visit: Payer: Self-pay | Admitting: Urology

## 2011-03-03 DIAGNOSIS — C61 Malignant neoplasm of prostate: Secondary | ICD-10-CM

## 2011-03-03 LAB — COMPREHENSIVE METABOLIC PANEL WITH GFR
ALT: 13 U/L (ref 0–53)
AST: 15 U/L (ref 0–37)
Albumin: 3.7 g/dL (ref 3.5–5.2)
Alkaline Phosphatase: 49 U/L (ref 39–117)
BUN: 24 mg/dL — ABNORMAL HIGH (ref 6–23)
CO2: 28 meq/L (ref 19–32)
Calcium: 9.6 mg/dL (ref 8.4–10.5)
Chloride: 100 meq/L (ref 96–112)
Creatinine, Ser: 0.96 mg/dL (ref 0.50–1.35)
GFR calc Af Amer: >90 mL/min
GFR calc non Af Amer: 82 mL/min — ABNORMAL LOW
Glucose, Bld: 261 mg/dL — ABNORMAL HIGH (ref 70–99)
Potassium: 4.5 meq/L (ref 3.5–5.1)
Sodium: 137 meq/L (ref 135–145)
Total Bilirubin: 0.1 mg/dL — ABNORMAL LOW (ref 0.3–1.2)
Total Protein: 6.8 g/dL (ref 6.0–8.3)

## 2011-03-03 LAB — CBC
HCT: 36.6 % — ABNORMAL LOW (ref 39.0–52.0)
Hemoglobin: 12.4 g/dL — ABNORMAL LOW (ref 13.0–17.0)
WBC: 5.3 10*3/uL (ref 4.0–10.5)

## 2011-03-03 LAB — APTT: aPTT: 35 s (ref 24–37)

## 2011-03-03 LAB — PROTIME-INR: Prothrombin Time: 12.6 seconds (ref 11.6–15.2)

## 2011-03-10 ENCOUNTER — Ambulatory Visit (HOSPITAL_COMMUNITY): Payer: Medicare Other | Attending: Urology

## 2011-03-10 ENCOUNTER — Ambulatory Visit (HOSPITAL_BASED_OUTPATIENT_CLINIC_OR_DEPARTMENT_OTHER)
Admission: RE | Admit: 2011-03-10 | Discharge: 2011-03-10 | Disposition: A | Discharge status: Home / Self Care | Payer: Medicare Other | Source: Ambulatory Visit | Attending: Urology | Admitting: Urology

## 2011-03-10 DIAGNOSIS — Z01812 Encounter for preprocedural laboratory examination: Secondary | ICD-10-CM | POA: Insufficient documentation

## 2011-03-10 DIAGNOSIS — G4733 Obstructive sleep apnea (adult) (pediatric): Secondary | ICD-10-CM | POA: Insufficient documentation

## 2011-03-10 DIAGNOSIS — I10 Essential (primary) hypertension: Secondary | ICD-10-CM | POA: Insufficient documentation

## 2011-03-10 DIAGNOSIS — C61 Malignant neoplasm of prostate: Secondary | ICD-10-CM | POA: Insufficient documentation

## 2011-03-10 DIAGNOSIS — E119 Type 2 diabetes mellitus without complications: Secondary | ICD-10-CM | POA: Insufficient documentation

## 2011-03-13 NOTE — Op Note (Signed)
  NAMEROCKLAND, KOTARSKI NO.:  [id number]  MEDICAL RECORD NO.:  [id number]  LOCATION:                                 FACILITY:  PHYSICIAN:  Bertram Millard. Astra Gregg, M.D.DATE OF BIRTH:  May 29, 1940  DATE OF PROCEDURE:  03/10/2011 DATE OF DISCHARGE:                              OPERATIVE REPORT   PREOPERATIVE DIAGNOSIS:  Adenocarcinoma of the prostate, clinical stage T2c.  POSTOPERATIVE DIAGNOSIS:  Adenocarcinoma of the prostate, clinical stage T2c.  PRINCIPLE PROCEDURE:  I-125 brachytherapy, cystoscopy.  SURGEON:  Bertram Millard. Retta Diones, M.D.  RADIATION ONCOLOGIST:  Oneita Hurt, M.D.  ANESTHESIA:  General with LMA.  COMPLICATIONS:  None.  BRIEF HISTORY:  Mr. Hillier is a 70 year old male who presents for I-125 brachytherapy.  The patient was diagnosed with a nodular prostate upon presentation to my practice on 10/2010.  PSA has been under 1. Ultrasound and biopsy of the prostate was performed due to the nodularity, revealing 6/12 cores positive for adenocarcinoma, 2 of these revealed Gleason 5 + 4 pattern, 3 revealed Gleason 4+5, and 1 revealed Gleason 4 + 4 pattern.  Staging/metastatic survey was negative.  He presents at this time for brachytherapy, having completed external beam radiotherapy.  He is also on hormonal ablation.  PROCEDURE:  The patient was identified in the holding area.  He received preoperative IV antibiotics.  He was taken to the operating room where general anesthetic was administered with LMA.  He was placed in the dorsal lithotomy position.  Genitalia and perineum as well as lower abdomen were prepped.  Foley catheter was placed with contrast and the balloon.  His scrotum and penis were retracted superiorly with a Tegaderm dressing.  Dr. Kathrynn Running then placed a transrectal ultrasound probe.  A rectal tube was then placed as well.  Dr. Kathrynn Running did appropriate prostate screening and planning.  Following adequate planning of the  brachytherapy, I then presented.  Time-out was then again performed.  At this point, according to the preprogramed treatment strategy, 21 needles were used to place 68 seeds using fluoroscopic guidance.  Please see the separately printed treatment program on Mr. Thiam for specifics about radiotherapy doses.  Following placement of all seeds, the catheter was removed and cystoscopy revealed no urethral or bladder trauma, and no seeds within the urethra or bladder.  The bladder was then left uncatheterized, and the patient was then awakened and taken to PACU in stable condition.  He tolerated the procedure well.     Bertram Millard. Meade Hogeland, M.D.     SMD/MEDQ  D:  03/10/2011  T:  03/10/2011  Job:  [id number]  cc:   Oneita Hurt, M.D. Fax: [phone]  Reather Littler, M.D. Fax: [phone]  Electronically Signed by Marcine Matar M.D. on 03/13/2011 01:12:17 PM

## 2011-03-21 ENCOUNTER — Ambulatory Visit: Payer: Medicare Other | Admitting: Urology

## 2011-03-30 ENCOUNTER — Telehealth: Payer: Self-pay | Admitting: *Deleted

## 2011-03-31 ENCOUNTER — Ambulatory Visit
Admission: RE | Admit: 2011-03-31 | Discharge: 2011-03-31 | Disposition: A | Discharge status: Home / Self Care | Payer: Medicare Other | Source: Ambulatory Visit | Attending: Radiation Oncology | Admitting: Radiation Oncology

## 2011-03-31 ENCOUNTER — Encounter: Payer: Self-pay | Admitting: Radiation Oncology

## 2011-03-31 VITALS — BP 158/70 | HR 58 | Temp 98.5°F | Resp 20 | Ht 70.5 in | Wt 238.9 lb

## 2011-03-31 DIAGNOSIS — Z79899 Other long term (current) drug therapy: Secondary | ICD-10-CM | POA: Insufficient documentation

## 2011-03-31 DIAGNOSIS — E119 Type 2 diabetes mellitus without complications: Secondary | ICD-10-CM | POA: Insufficient documentation

## 2011-03-31 DIAGNOSIS — C61 Malignant neoplasm of prostate: Secondary | ICD-10-CM | POA: Diagnosis present

## 2011-03-31 DIAGNOSIS — G4730 Sleep apnea, unspecified: Secondary | ICD-10-CM | POA: Insufficient documentation

## 2011-03-31 DIAGNOSIS — I10 Essential (primary) hypertension: Secondary | ICD-10-CM | POA: Insufficient documentation

## 2011-03-31 NOTE — Progress Notes (Signed)
Pt reports diff intitating stream qam, stops- starts. Pt received Flomax at urologist office today. Denies diarrhea, urinary freq, does have urgency.

## 2011-03-31 NOTE — Progress Notes (Signed)
  Radiation Oncology         [phone]) [phone] ________________________________  Name: STACY DESHLER MRN: [id number]  Date: 03/31/2011  DOB: Dec 31, 1940       Follow-Up Visit Note  CC:  Marcine Matar, MD  Interval Since Last Radiation:  3 weeks  Narrative . . . . . . . . The patient returns today for routine follow-up.  He does note some increased urinary frequency. He also notes increased urinary obstructive symptoms, particularly in the morning. These abate during the course of the day. The patient met with urology earlier today. He was given samples of Rapaflo.  He denies any rectal symptoms.                          Meds. . . . . . . . . . . . Current Outpatient Prescriptions  Medication Sig Dispense Refill  . benazepril (LOTENSIN) 40 MG tablet Take 40 mg by mouth daily.        Marland Kitchen ezetimibe-simvastatin (VYTORIN) 10-20 MG per tablet Take 1 tablet by mouth at bedtime.        Marland Kitchen glipiZIDE (GLUCOTROL) 10 MG tablet Take 10 mg by mouth 2 (two) times daily before a meal.        . insulin lispro (HUMALOG) 100 UNIT/ML injection Inject into the skin 3 (three) times daily before meals.        . insulin NPH (HUMULIN N,NOVOLIN N) 100 UNIT/ML injection Inject 20 Units into the skin.        . metFORMIN (GLUCOPHAGE) 1000 MG tablet Take 1,000 mg by mouth 2 (two) times daily with a meal.        . Tamsulosin HCl (FLOMAX) 0.4 MG CAPS Take by mouth.          Physical Findings. . .  height is 5' 10.5" (1.791 m) and weight is 238 lb 14.4 oz (108.364 kg). His temperature is 98.5 F (36.9 C). His blood pressure is 158/70 and his pulse is 58. His respiration is 20. Marland Kitchen  No significant changes.  X-Ray Findings. . . . the patient underwent CT imaging today. Reviewed the CT study set. The prostate appears to be uniformly populated by radioactive seeds with no portions of the gland under covered. There are no seeds in or near the rectum. The quality of the seed implant appears appropriate. This CT study set will be  upload to the [facility] planning software to perform a formal post implant three-dimensional plan.  Impression . . . . . . . The patient is recovering from the effects of radiation.  He is suffering with some urinary symptoms.  Plan . . . . . . . . . . . Marland Kitchen the patient will continue to follow with urology for ongoing androgen deprivation. I encouraged him to contact our clinic or return at any point in the future if clinically indicated. Otherwise, he will return to radiation oncology on an as-needed basis.  _____________________________________  Artist Pais Kathrynn Running, M.D.

## 2011-05-07 ENCOUNTER — Encounter: Payer: Self-pay | Admitting: Radiation Oncology

## 2011-05-07 NOTE — Progress Notes (Signed)
   Department of Radiation Oncology  Phone:  [phone] Fax:        [phone]    [facility] Radiation Oncology 3-D Planning Note Prostate Brachytherapy  Name: BERNABE DORCE MRN: [id number] DOB: 16-May-1940  CC:  Marcine Matar, MD  Diagnosis: 70 year old gentleman with stage T2b. adenocarcinoma of the prostate with a Gleason's score of 5 + 4 and PSA of 0.48  Narrative: Debara Pickett returned following prostate seed implantation for post implant planning. He underwent CT scan to delineate the three-dimensional structures of the pelvis and demonstrate the radiation distribution.  Results:   Prostate Coverage - The dose of radiation delivered to the 90% or more of the prostate gland (D90) was 116.32 % of the prescription dose. This exceeds our goal of greater than 90%. Rectal Sparing - The volume of rectal tissue receiving the prescription dose or higher was 0.09 cc. This falls under our thresholds tolerance of 1.0 cc.  Impression: JARYD DREW prostate seed implant appears to show adequate target coverage and appropriate rectal sparing.  Plan:  The patient will continue to follow with urology for ongoing PSA determinations. I would anticipate a high likelihood for local tumor control with minimal risk for rectal morbidity. ------------------------------------------------  Artist Pais. Kathrynn Running, M.D.  Radiation Oncology [phone]

## 2011-05-22 DIAGNOSIS — E785 Hyperlipidemia, unspecified: Secondary | ICD-10-CM | POA: Diagnosis not present

## 2011-05-22 DIAGNOSIS — D509 Iron deficiency anemia, unspecified: Secondary | ICD-10-CM | POA: Diagnosis not present

## 2011-05-22 DIAGNOSIS — I10 Essential (primary) hypertension: Secondary | ICD-10-CM | POA: Diagnosis not present

## 2011-07-06 DIAGNOSIS — H5330 Unspecified disorder of binocular vision: Secondary | ICD-10-CM | POA: Diagnosis not present

## 2011-07-07 NOTE — Telephone Encounter (Signed)
xxx

## 2011-07-12 DIAGNOSIS — C61 Malignant neoplasm of prostate: Secondary | ICD-10-CM | POA: Diagnosis not present

## 2011-08-21 DIAGNOSIS — I10 Essential (primary) hypertension: Secondary | ICD-10-CM | POA: Diagnosis not present

## 2011-08-21 DIAGNOSIS — E785 Hyperlipidemia, unspecified: Secondary | ICD-10-CM | POA: Diagnosis not present

## 2011-08-21 DIAGNOSIS — E119 Type 2 diabetes mellitus without complications: Secondary | ICD-10-CM | POA: Diagnosis not present

## 2011-08-21 DIAGNOSIS — D509 Iron deficiency anemia, unspecified: Secondary | ICD-10-CM | POA: Diagnosis not present

## 2011-10-03 DIAGNOSIS — H33029 Retinal detachment with multiple breaks, unspecified eye: Secondary | ICD-10-CM | POA: Diagnosis not present

## 2011-10-03 DIAGNOSIS — H43399 Other vitreous opacities, unspecified eye: Secondary | ICD-10-CM | POA: Diagnosis not present

## 2011-10-03 DIAGNOSIS — H43819 Vitreous degeneration, unspecified eye: Secondary | ICD-10-CM | POA: Diagnosis not present

## 2011-10-03 DIAGNOSIS — H35379 Puckering of macula, unspecified eye: Secondary | ICD-10-CM | POA: Diagnosis not present

## 2011-10-13 DIAGNOSIS — Z79899 Other long term (current) drug therapy: Secondary | ICD-10-CM | POA: Diagnosis not present

## 2011-10-13 DIAGNOSIS — E7800 Pure hypercholesterolemia, unspecified: Secondary | ICD-10-CM | POA: Diagnosis not present

## 2011-10-18 DIAGNOSIS — I10 Essential (primary) hypertension: Secondary | ICD-10-CM | POA: Diagnosis not present

## 2011-10-18 DIAGNOSIS — E1169 Type 2 diabetes mellitus with other specified complication: Secondary | ICD-10-CM | POA: Diagnosis not present

## 2011-10-18 DIAGNOSIS — E7800 Pure hypercholesterolemia, unspecified: Secondary | ICD-10-CM | POA: Diagnosis not present

## 2011-11-21 DIAGNOSIS — I10 Essential (primary) hypertension: Secondary | ICD-10-CM | POA: Diagnosis not present

## 2011-11-21 DIAGNOSIS — D509 Iron deficiency anemia, unspecified: Secondary | ICD-10-CM | POA: Diagnosis not present

## 2011-12-19 ENCOUNTER — Emergency Department (HOSPITAL_COMMUNITY): Payer: Medicare Other

## 2011-12-19 ENCOUNTER — Inpatient Hospital Stay (HOSPITAL_COMMUNITY)
Admission: EM | Admit: 2011-12-19 | Discharge: 2011-12-20 | DRG: 310 | Disposition: A | Discharge status: Home / Self Care | Payer: Medicare Other | Attending: Internal Medicine | Admitting: Internal Medicine

## 2011-12-19 ENCOUNTER — Encounter (HOSPITAL_COMMUNITY): Payer: Self-pay | Admitting: *Deleted

## 2011-12-19 DIAGNOSIS — W108XXA Fall (on) (from) other stairs and steps, initial encounter: Secondary | ICD-10-CM | POA: Diagnosis present

## 2011-12-19 DIAGNOSIS — E11319 Type 2 diabetes mellitus with unspecified diabetic retinopathy without macular edema: Secondary | ICD-10-CM | POA: Diagnosis not present

## 2011-12-19 DIAGNOSIS — E782 Mixed hyperlipidemia: Secondary | ICD-10-CM | POA: Diagnosis present

## 2011-12-19 DIAGNOSIS — R404 Transient alteration of awareness: Secondary | ICD-10-CM | POA: Diagnosis not present

## 2011-12-19 DIAGNOSIS — W19XXXA Unspecified fall, initial encounter: Secondary | ICD-10-CM

## 2011-12-19 DIAGNOSIS — T148XXA Other injury of unspecified body region, initial encounter: Secondary | ICD-10-CM | POA: Diagnosis not present

## 2011-12-19 DIAGNOSIS — I319 Disease of pericardium, unspecified: Secondary | ICD-10-CM | POA: Diagnosis not present

## 2011-12-19 DIAGNOSIS — Z8546 Personal history of malignant neoplasm of prostate: Secondary | ICD-10-CM | POA: Diagnosis not present

## 2011-12-19 DIAGNOSIS — H579 Unspecified disorder of eye and adnexa: Secondary | ICD-10-CM

## 2011-12-19 DIAGNOSIS — S0100XA Unspecified open wound of scalp, initial encounter: Secondary | ICD-10-CM | POA: Diagnosis not present

## 2011-12-19 DIAGNOSIS — I4891 Unspecified atrial fibrillation: Principal | ICD-10-CM

## 2011-12-19 DIAGNOSIS — E1169 Type 2 diabetes mellitus with other specified complication: Secondary | ICD-10-CM | POA: Diagnosis not present

## 2011-12-19 DIAGNOSIS — S060X9A Concussion with loss of consciousness of unspecified duration, initial encounter: Secondary | ICD-10-CM | POA: Diagnosis not present

## 2011-12-19 DIAGNOSIS — C61 Malignant neoplasm of prostate: Secondary | ICD-10-CM

## 2011-12-19 DIAGNOSIS — E119 Type 2 diabetes mellitus without complications: Secondary | ICD-10-CM

## 2011-12-19 DIAGNOSIS — I10 Essential (primary) hypertension: Secondary | ICD-10-CM

## 2011-12-19 DIAGNOSIS — I517 Cardiomegaly: Secondary | ICD-10-CM | POA: Diagnosis not present

## 2011-12-19 DIAGNOSIS — Z7982 Long term (current) use of aspirin: Secondary | ICD-10-CM | POA: Diagnosis not present

## 2011-12-19 DIAGNOSIS — S0190XA Unspecified open wound of unspecified part of head, initial encounter: Secondary | ICD-10-CM | POA: Diagnosis not present

## 2011-12-19 DIAGNOSIS — S0101XA Laceration without foreign body of scalp, initial encounter: Secondary | ICD-10-CM

## 2011-12-19 DIAGNOSIS — S199XXA Unspecified injury of neck, initial encounter: Secondary | ICD-10-CM | POA: Diagnosis not present

## 2011-12-19 DIAGNOSIS — I2510 Atherosclerotic heart disease of native coronary artery without angina pectoris: Secondary | ICD-10-CM | POA: Diagnosis present

## 2011-12-19 DIAGNOSIS — E1139 Type 2 diabetes mellitus with other diabetic ophthalmic complication: Secondary | ICD-10-CM | POA: Diagnosis present

## 2011-12-19 DIAGNOSIS — Z794 Long term (current) use of insulin: Secondary | ICD-10-CM

## 2011-12-19 DIAGNOSIS — S0191XA Laceration without foreign body of unspecified part of head, initial encounter: Secondary | ICD-10-CM

## 2011-12-19 DIAGNOSIS — S0993XA Unspecified injury of face, initial encounter: Secondary | ICD-10-CM | POA: Diagnosis not present

## 2011-12-19 DIAGNOSIS — E785 Hyperlipidemia, unspecified: Secondary | ICD-10-CM

## 2011-12-19 DIAGNOSIS — R4182 Altered mental status, unspecified: Secondary | ICD-10-CM | POA: Diagnosis not present

## 2011-12-19 DIAGNOSIS — Y998 Other external cause status: Secondary | ICD-10-CM

## 2011-12-19 DIAGNOSIS — M25859 Other specified joint disorders, unspecified hip: Secondary | ICD-10-CM | POA: Diagnosis not present

## 2011-12-19 DIAGNOSIS — IMO0002 Reserved for concepts with insufficient information to code with codable children: Secondary | ICD-10-CM | POA: Diagnosis not present

## 2011-12-19 HISTORY — DX: Personal history of malignant neoplasm of prostate: Z85.46

## 2011-12-19 HISTORY — DX: Type 2 diabetes mellitus with unspecified diabetic retinopathy without macular edema: E11.319

## 2011-12-19 HISTORY — DX: Calculus of kidney: N20.0

## 2011-12-19 HISTORY — DX: Essential (primary) hypertension: I10

## 2011-12-19 HISTORY — DX: Other intervertebral disc displacement, lumbar region: M51.26

## 2011-12-19 HISTORY — DX: Other intervertebral disc degeneration, lumbar region: M51.36

## 2011-12-19 HISTORY — DX: Other intervertebral disc degeneration, lumbar region without mention of lumbar back pain or lower extremity pain: M51.369

## 2011-12-19 LAB — POCT I-STAT, CHEM 8
BUN: 22 mg/dL (ref 6–23)
Chloride: 104 mEq/L (ref 96–112)
Creatinine, Ser: 1.2 mg/dL (ref 0.50–1.35)
Sodium: 140 mEq/L (ref 135–145)
TCO2: 24 mmol/L (ref 0–100)

## 2011-12-19 LAB — GLUCOSE, CAPILLARY: Glucose-Capillary: 311 mg/dL — ABNORMAL HIGH (ref 70–99)

## 2011-12-19 LAB — MAGNESIUM: Magnesium: 2 mg/dL (ref 1.5–2.5)

## 2011-12-19 LAB — URINALYSIS, MICROSCOPIC ONLY
Ketones, ur: NEGATIVE mg/dL
Protein, ur: 30 mg/dL — AB
Urobilinogen, UA: 0.2 mg/dL (ref 0.0–1.0)

## 2011-12-19 LAB — CARDIAC PANEL(CRET KIN+CKTOT+MB+TROPI)
CK, MB: 4.5 ng/mL — ABNORMAL HIGH (ref 0.3–4.0)
Relative Index: 1.6 (ref 0.0–2.5)
Total CK: 286 U/L — ABNORMAL HIGH (ref 7–232)
Troponin I: <0.3 ng/mL

## 2011-12-19 LAB — CBC
MCHC: 33.3 g/dL (ref 30.0–36.0)
RDW: 13.5 % (ref 11.5–15.5)

## 2011-12-19 LAB — COMPREHENSIVE METABOLIC PANEL
ALT: 19 U/L (ref 0–53)
Alkaline Phosphatase: 48 U/L (ref 39–117)
CO2: 27 mEq/L (ref 19–32)
Chloride: 101 mEq/L (ref 96–112)
GFR calc Af Amer: 77 mL/min — ABNORMAL LOW (ref >90)
GFR calc non Af Amer: 66 mL/min — ABNORMAL LOW (ref >90)
Glucose, Bld: 178 mg/dL — ABNORMAL HIGH (ref 70–99)
Potassium: 3.6 mEq/L (ref 3.5–5.1)
Sodium: 139 mEq/L (ref 135–145)

## 2011-12-19 LAB — PROTIME-INR: Prothrombin Time: 11.9 seconds (ref 11.6–15.2)

## 2011-12-19 LAB — POCT I-STAT TROPONIN I: Troponin i, poc: 0 ng/mL (ref 0.00–0.08)

## 2011-12-19 LAB — SAMPLE TO BLOOD BANK

## 2011-12-19 LAB — PHOSPHORUS: Phosphorus: 3.7 mg/dL (ref 2.3–4.6)

## 2011-12-19 MED ORDER — INSULIN GLARGINE 100 UNIT/ML ~~LOC~~ SOLN
15.0000 [IU] | Freq: Every day | SUBCUTANEOUS | Status: DC
  Administered 2011-12-19: 15 [IU] via SUBCUTANEOUS

## 2011-12-19 MED ORDER — INSULIN ASPART 100 UNIT/ML ~~LOC~~ SOLN
0.0000 [IU] | Freq: Three times a day (TID) | SUBCUTANEOUS | Status: DC
  Administered 2011-12-20: 2 [IU] via SUBCUTANEOUS
  Administered 2011-12-20: 3 [IU] via SUBCUTANEOUS

## 2011-12-19 MED ORDER — ONDANSETRON HCL 4 MG/2ML IJ SOLN: 4.0000 mg | Freq: Four times a day (QID) | INTRAMUSCULAR | Status: DC | PRN

## 2011-12-19 MED ORDER — IOHEXOL 300 MG/ML  SOLN
100.0000 mL | Freq: Once | INTRAMUSCULAR | Status: AC | PRN
  Administered 2011-12-19: 100 mL via INTRAVENOUS

## 2011-12-19 MED ORDER — SENNOSIDES-DOCUSATE SODIUM 8.6-50 MG PO TABS
1.0000 | ORAL_TABLET | Freq: Every evening | ORAL | Status: DC | PRN
  Filled 2011-12-19: qty 1

## 2011-12-19 MED ORDER — DILTIAZEM HCL 100 MG IV SOLR
5.0000 mg/h | INTRAVENOUS | Status: DC
  Administered 2011-12-19: 15 mg/h via INTRAVENOUS
  Administered 2011-12-19: 10 mg/h via INTRAVENOUS

## 2011-12-19 MED ORDER — ACETAMINOPHEN 325 MG PO TABS: 650.0000 mg | ORAL_TABLET | Freq: Four times a day (QID) | ORAL | Status: DC | PRN

## 2011-12-19 MED ORDER — SODIUM CHLORIDE 0.9 % IV SOLN
INTRAVENOUS | Status: DC
  Administered 2011-12-19: 23:00:00 via INTRAVENOUS

## 2011-12-19 MED ORDER — SODIUM CHLORIDE 0.9 % IJ SOLN
3.0000 mL | Freq: Two times a day (BID) | INTRAMUSCULAR | Status: DC
  Administered 2011-12-19: 3 mL via INTRAVENOUS

## 2011-12-19 MED ORDER — EZETIMIBE-SIMVASTATIN 10-20 MG PO TABS
1.0000 | ORAL_TABLET | Freq: Every day | ORAL | Status: DC
  Administered 2011-12-19: 1 via ORAL
  Filled 2011-12-19 (×2): qty 1

## 2011-12-19 MED ORDER — DILTIAZEM LOAD VIA INFUSION
20.0000 mg | Freq: Once | INTRAVENOUS | Status: AC
  Administered 2011-12-19: 20 mg via INTRAVENOUS
  Filled 2011-12-19: qty 20

## 2011-12-19 MED ORDER — HYDROCODONE-ACETAMINOPHEN 5-325 MG PO TABS: 1.0000 | ORAL_TABLET | ORAL | Status: DC | PRN

## 2011-12-19 MED ORDER — ONDANSETRON HCL 4 MG PO TABS: 4.0000 mg | ORAL_TABLET | Freq: Four times a day (QID) | ORAL | Status: DC | PRN

## 2011-12-19 MED ORDER — PANTOPRAZOLE SODIUM 40 MG PO TBEC
40.0000 mg | DELAYED_RELEASE_TABLET | Freq: Every day | ORAL | Status: DC
  Administered 2011-12-20: 40 mg via ORAL
  Filled 2011-12-19 (×2): qty 1

## 2011-12-19 MED ORDER — BENAZEPRIL HCL 40 MG PO TABS
40.0000 mg | ORAL_TABLET | Freq: Every day | ORAL | Status: DC
  Administered 2011-12-20: 40 mg via ORAL
  Filled 2011-12-19: qty 1

## 2011-12-19 MED ORDER — ASPIRIN EC 81 MG PO TBEC
81.0000 mg | DELAYED_RELEASE_TABLET | Freq: Every day | ORAL | Status: DC
  Administered 2011-12-20: 81 mg via ORAL
  Filled 2011-12-19: qty 1

## 2011-12-19 MED ORDER — SODIUM CHLORIDE 0.9 % IV SOLN
INTRAVENOUS | Status: DC | PRN
  Administered 2011-12-19: 50 mL/h via INTRAVENOUS

## 2011-12-19 MED ORDER — ACETAMINOPHEN 650 MG RE SUPP: 650.0000 mg | Freq: Four times a day (QID) | RECTAL | Status: DC | PRN

## 2011-12-19 MED ORDER — ONDANSETRON HCL 4 MG/2ML IJ SOLN
INTRAMUSCULAR | Status: AC
  Administered 2011-12-19: 15:00:00
  Filled 2011-12-19: qty 2

## 2011-12-19 NOTE — Consult Note (Signed)
Reason for Consult:Fall Referring Physician: Dr. [name] is an 71 y.o. male.  HPI: Patient suffered a mechanical fall down 2-3 stairs when he tripped over something at a work site. He struck the back of his head on some scaffolding as he fell. There was a positive LOC. He is amnestic to the event. He came in as a level 2 trauma due to decreased GSS but it improved to normal during his time in the ED. He had an occipital scalp laceration that was difficult to control the bleeding but had stopped by the time I saw him. He denies any pain, N/V, or other post-concussive symptoms.  The patient's main complaint is episodic vertigo that is preceded by pulsatile tinnitus and lasts for 1-2 minutes. He says this only occurs when he is on a "blocker" medicine and he's been on several. He denies precipitating factors such as head movement but does admit to 1 episode that occurred while sleeping and woke him from sleep. He says he will not take any "blocker" medicines.  Past Medical History  Diagnosis Date  . Cancer   . Hypertension   . Diabetes mellitus     Past Surgical History  Procedure Date  . Back surgery     No family history on file.  Social History:  does not have a smoking history on file. He does not have any smokeless tobacco history on file. His alcohol and drug histories not on file.  Allergies:  Allergies  Allergen Reactions  . Other Other (See Comments)    Beta or alpha blockers, dizziness    Medications: I have reviewed the patient's current medications.  Results for orders placed during the hospital encounter of 12/19/11 (from the past 48 hour(s))  COMPREHENSIVE METABOLIC PANEL     Status: Abnormal   Collection Time   12/19/11  2:17 PM      Component Value Range Comment   Sodium 139  135 - 145 mEq/L    Potassium 3.6  3.5 - 5.1 mEq/L    Chloride 101  96 - 112 mEq/L    CO2 27  19 - 32 mEq/L    Glucose, Bld 178 (*) 70 - 99 mg/dL    BUN 22  6 - 23 mg/dL    Creatinine, Ser 4.69  0.50 - 1.35 mg/dL    Calcium 9.6  8.4 - 62.9 mg/dL    Total Protein 7.4  6.0 - 8.3 g/dL    Albumin 3.9  3.5 - 5.2 g/dL    AST 23  0 - 37 U/L    ALT 19  0 - 53 U/L    Alkaline Phosphatase 48  39 - 117 U/L    Total Bilirubin 0.2 (*) 0.3 - 1.2 mg/dL    GFR calc non Af Amer 66 (*) >90 mL/min    GFR calc Af Amer 77 (*) >90 mL/min   CBC     Status: Abnormal   Collection Time   12/19/11  2:17 PM      Component Value Range Comment   WBC 7.2  4.0 - 10.5 K/uL    RBC 4.10 (*) 4.22 - 5.81 MIL/uL    Hemoglobin 12.1 (*) 13.0 - 17.0 g/dL    HCT 52.8 (*) 41.3 - 52.0 %    MCV 88.5  78.0 - 100.0 fL    MCH 29.5  26.0 - 34.0 pg    MCHC 33.3  30.0 - 36.0 g/dL    RDW 13.5  11.5 - 15.5 %    Platelets 220  150 - 400 K/uL   PROTIME-INR     Status: Normal   Collection Time   12/19/11  2:17 PM      Component Value Range Comment   Prothrombin Time 11.9  11.6 - 15.2 seconds    INR 0.86  0.00 - 1.49   LACTIC ACID, PLASMA     Status: Normal   Collection Time   12/19/11  2:18 PM      Component Value Range Comment   Lactic Acid, Venous 1.2  0.5 - 2.2 mmol/L   SAMPLE TO BLOOD BANK     Status: Normal   Collection Time   12/19/11  2:20 PM      Component Value Range Comment   Blood Bank Specimen SAMPLE AVAILABLE FOR TESTING      Sample Expiration 12/20/2011     URINALYSIS, WITH MICROSCOPIC     Status: Abnormal   Collection Time   12/19/11  2:25 PM      Component Value Range Comment   Color, Urine YELLOW  YELLOW    APPearance CLEAR  CLEAR    Specific Gravity, Urine 1.011  1.005 - 1.030    pH 7.0  5.0 - 8.0    Glucose, UA NEGATIVE  NEGATIVE mg/dL    Hgb urine dipstick MODERATE (*) NEGATIVE    Bilirubin Urine NEGATIVE  NEGATIVE    Ketones, ur NEGATIVE  NEGATIVE mg/dL    Protein, ur 30 (*) NEGATIVE mg/dL    Urobilinogen, UA 0.2  0.0 - 1.0 mg/dL    Nitrite NEGATIVE  NEGATIVE    Leukocytes, UA NEGATIVE  NEGATIVE    RBC / HPF 21-50  <3 RBC/hpf    Bacteria, UA RARE  RARE    Squamous  Epithelial / LPF RARE  RARE   POCT I-STAT TROPONIN I     Status: Normal   Collection Time   12/19/11  2:43 PM      Component Value Range Comment   Troponin i, poc 0.00  0.00 - 0.08 ng/mL    Comment 3            POCT I-STAT, CHEM 8     Status: Abnormal   Collection Time   12/19/11  2:45 PM      Component Value Range Comment   Sodium 140  135 - 145 mEq/L    Potassium 3.6  3.5 - 5.1 mEq/L    Chloride 104  96 - 112 mEq/L    BUN 22  6 - 23 mg/dL    Creatinine, Ser 5.36  0.50 - 1.35 mg/dL    Glucose, Bld 644 (*) 70 - 99 mg/dL    Calcium, Ion 0.34  7.42 - 1.30 mmol/L    TCO2 24  0 - 100 mmol/L    Hemoglobin 12.6 (*) 13.0 - 17.0 g/dL    HCT 59.5 (*) 63.8 - 52.0 %     Ct Head Wo Contrast  12/19/2011  *RADIOLOGY REPORT*  Clinical Data:  History of trauma from a fall.  Loss of consciousness.  CT HEAD WITHOUT CONTRAST CT CERVICAL SPINE WITHOUT CONTRAST  Technique:  Multidetector CT imaging of the head and cervical spine was performed following the standard protocol without intravenous contrast.  Multiplanar CT image reconstructions of the cervical spine were also generated.  Comparison:  Head CT 09/25/2009.  CT HEAD  Findings: Soft tissue thickening and high attenuation in the right parietal occipital scalp compatible with a  contusion/laceration. Overlying skin staples are noted.  All lacunar infarctions in the left putamen and left extreme capsule, unchanged.  Patchy and confluent areas of decreased attenuation throughout the deep and periventricular white matter of the cerebral hemispheres bilaterally, compatible with chronic microvascular ischemic changes (similar to prior).  No acute displaced skull fractures are identified.  No acute intracranial abnormality.  Specifically, no evidence of acute post-traumatic intracranial hemorrhage, no definite regions of acute/subacute cerebral ischemia, no focal mass, mass effect, hydrocephalus or abnormal intra or extra-axial fluid collections.  The visualized  paranasal sinuses and mastoids are well pneumatized.  IMPRESSION: 1.  No acute displaced skull fractures or acute intracranial abnormalities. 2. Old left-sided lacunar infarctions and chronic microvascular ischemic changes in the white matter, similar to prior study 09/25/2009.  CT CERVICAL SPINE  Findings: No acute displaced cervical spine fractures are noted. Mild straightening of cervical lordosis, presumably positional. Prevertebral soft tissues are normal.  Multilevel degenerative disc disease, multilevel degenerative disc disease most severe C5-C6 and C6-C7.  Mild multilevel facet arthropathy is also noted. Visualized portions of the upper thorax are unremarkable.  IMPRESSION: 1.  No evidence of significant acute traumatic injury to the cervical spine. 2.  Mild multilevel degenerative disc disease and cervical spondylosis, as above.  Original Report Authenticated By: Florencia Reasons, M.D.   Ct Chest W Contrast  12/19/2011  *RADIOLOGY REPORT*  Clinical Data:  Fall, loss of consciousness.  CT CHEST, ABDOMEN AND PELVIS WITH CONTRAST  Technique:  Multidetector CT imaging of the chest, abdomen and pelvis was performed following the standard protocol during bolus administration of intravenous contrast.  Contrast: OMNIPAQUE IOHEXOL 300 MG/ML  SOLN  Comparison:  CT of the abdomen pelvis 11/09/2010  CT CHEST  Findings:  Cardiomegaly.  Dense coronary artery calcifications. Aorta is normal caliber.  No mediastinal hematoma.  Trace pericardial effusion.  No pleural effusion.  No confluent opacity in the lungs.  No pneumothorax.  No acute bony abnormality. No mediastinal, hilar, or axillary adenopathy.  Visualized thyroid and chest wall soft tissues unremarkable.  IMPRESSION: Mild cardiomegaly.  Dense coronary artery calcifications.  Trace pericardial effusion.  No evidence of acute injury to the chest.  CT ABDOMEN AND PELVIS  Findings:  Liver, gallbladder, spleen, pancreas, adrenals and right kidney are  unremarkable.  Left kidney mildly atrophic.  Left renal artery calcifications present.  Extensive aortic and iliac calcifications.  No aneurysm.  Stomach, large and small bowel are grossly unremarkable.  Urinary bladder unremarkable.  Radiation seeds in the prostate.  No free fluid, free air or adenopathy.  Postsurgical changes from prior posterior fusion at L4-5.  No acute bony abnormality.  Small soft tissue densities are noted in the anterior subcutaneous soft tissues, possibly related to subcutaneous injections.  IMPRESSION: No acute findings in the abdomen or pelvis.  No evidence of acute injury.  Radiation seeds in the prostate.  Original Report Authenticated By: Cyndie Chime, M.D.   Ct Cervical Spine Wo Contrast  12/19/2011  *RADIOLOGY REPORT*  Clinical Data:  History of trauma from a fall.  Loss of consciousness.  CT HEAD WITHOUT CONTRAST CT CERVICAL SPINE WITHOUT CONTRAST  Technique:  Multidetector CT imaging of the head and cervical spine was performed following the standard protocol without intravenous contrast.  Multiplanar CT image reconstructions of the cervical spine were also generated.  Comparison:  Head CT 09/25/2009.  CT HEAD  Findings: Soft tissue thickening and high attenuation in the right parietal occipital scalp compatible with a contusion/laceration. Overlying  skin staples are noted.  All lacunar infarctions in the left putamen and left extreme capsule, unchanged.  Patchy and confluent areas of decreased attenuation throughout the deep and periventricular white matter of the cerebral hemispheres bilaterally, compatible with chronic microvascular ischemic changes (similar to prior).  No acute displaced skull fractures are identified.  No acute intracranial abnormality.  Specifically, no evidence of acute post-traumatic intracranial hemorrhage, no definite regions of acute/subacute cerebral ischemia, no focal mass, mass effect, hydrocephalus or abnormal intra or extra-axial fluid  collections.  The visualized paranasal sinuses and mastoids are well pneumatized.  IMPRESSION: 1.  No acute displaced skull fractures or acute intracranial abnormalities. 2. Old left-sided lacunar infarctions and chronic microvascular ischemic changes in the white matter, similar to prior study 09/25/2009.  CT CERVICAL SPINE  Findings: No acute displaced cervical spine fractures are noted. Mild straightening of cervical lordosis, presumably positional. Prevertebral soft tissues are normal.  Multilevel degenerative disc disease, multilevel degenerative disc disease most severe C5-C6 and C6-C7.  Mild multilevel facet arthropathy is also noted. Visualized portions of the upper thorax are unremarkable.  IMPRESSION: 1.  No evidence of significant acute traumatic injury to the cervical spine. 2.  Mild multilevel degenerative disc disease and cervical spondylosis, as above.  Original Report Authenticated By: Florencia Reasons, M.D.   Ct Abdomen Pelvis W Contrast  12/19/2011  *RADIOLOGY REPORT*  Clinical Data:  Fall, loss of consciousness.  CT CHEST, ABDOMEN AND PELVIS WITH CONTRAST  Technique:  Multidetector CT imaging of the chest, abdomen and pelvis was performed following the standard protocol during bolus administration of intravenous contrast.  Contrast: OMNIPAQUE IOHEXOL 300 MG/ML  SOLN  Comparison:  CT of the abdomen pelvis 11/09/2010  CT CHEST  Findings:  Cardiomegaly.  Dense coronary artery calcifications. Aorta is normal caliber.  No mediastinal hematoma.  Trace pericardial effusion.  No pleural effusion.  No confluent opacity in the lungs.  No pneumothorax.  No acute bony abnormality. No mediastinal, hilar, or axillary adenopathy.  Visualized thyroid and chest wall soft tissues unremarkable.  IMPRESSION: Mild cardiomegaly.  Dense coronary artery calcifications.  Trace pericardial effusion.  No evidence of acute injury to the chest.  CT ABDOMEN AND PELVIS  Findings:  Liver, gallbladder, spleen, pancreas,  adrenals and right kidney are unremarkable.  Left kidney mildly atrophic.  Left renal artery calcifications present.  Extensive aortic and iliac calcifications.  No aneurysm.  Stomach, large and small bowel are grossly unremarkable.  Urinary bladder unremarkable.  Radiation seeds in the prostate.  No free fluid, free air or adenopathy.  Postsurgical changes from prior posterior fusion at L4-5.  No acute bony abnormality.  Small soft tissue densities are noted in the anterior subcutaneous soft tissues, possibly related to subcutaneous injections.  IMPRESSION: No acute findings in the abdomen or pelvis.  No evidence of acute injury.  Radiation seeds in the prostate.  Original Report Authenticated By: Cyndie Chime, M.D.   Dg Pelvis Portable  12/19/2011  *RADIOLOGY REPORT*  Clinical Data: Laceration to head, fall, trauma.  PORTABLE PELVIS  Comparison: CT 11/09/2010  Findings: Radiation seeds noted in the region of the prostate. Posterior fusion changes in the lower lumbar spine.  Mild joint space narrowing within the hips. No acute bony abnormality. Specifically, no fracture, subluxation, or dislocation.  Soft tissues are intact.  SI joints are symmetric and unremarkable.  IMPRESSION: No acute bony abnormality.  Original Report Authenticated By: Cyndie Chime, M.D.   Dg Chest Port 1 View  12/19/2011  *  RADIOLOGY REPORT*  Clinical Data: Fall, laceration to head.  PORTABLE CHEST - 1 VIEW  Comparison: 03/03/2011  Findings: Mild cardiomegaly.  Lungs are clear.  No effusions.  No acute bony abnormality.  IMPRESSION: Mild cardiomegaly.  No acute findings.  Original Report Authenticated By: Cyndie Chime, M.D.    Review of Systems  Constitutional: Negative for weight loss.  HENT: Positive for tinnitus (Pulsatile, preceding vertiginous episodes). Negative for hearing loss, ear pain, neck pain and ear discharge.   Eyes: Negative for blurred vision, double vision, photophobia and pain.  Respiratory: Negative for  cough, sputum production and shortness of breath.   Cardiovascular: Negative for chest pain.  Gastrointestinal: Negative for nausea, vomiting and abdominal pain.  Genitourinary: Negative for dysuria, urgency, frequency and flank pain.  Musculoskeletal: Negative for myalgias, back pain, joint pain and falls.  Neurological: Positive for dizziness and loss of consciousness. Negative for tingling, sensory change, focal weakness and headaches.  Endo/Heme/Allergies: Does not bruise/bleed easily.  Psychiatric/Behavioral: Positive for memory loss. Negative for depression and substance abuse. The patient is not nervous/anxious.    Blood pressure 131/64, pulse 60, resp. rate 16, SpO2 98.00%. Physical Exam  Vitals reviewed. Constitutional: He is oriented to person, place, and time. He appears well-developed and well-nourished. He is cooperative. No distress. Cervical collar and nasal cannula in place.  HENT:  Head: Normocephalic. Head is with laceration. Head is without raccoon's eyes, without Battle's sign, without abrasion and without contusion.    Right Ear: Hearing, tympanic membrane, external ear and ear canal normal. No lacerations. No drainage or tenderness. No foreign bodies. Tympanic membrane is not perforated. No hemotympanum.  Left Ear: Hearing, external ear and ear canal normal. No lacerations. No drainage or tenderness. No foreign bodies. Tympanic membrane is not perforated. No hemotympanum.  Ears:  Nose: Nose normal. No nose lacerations, sinus tenderness, nasal deformity or nasal septal hematoma. No epistaxis.  Mouth/Throat: Uvula is midline, oropharynx is clear and moist and mucous membranes are normal. No lacerations.  Eyes: Conjunctivae, EOM and lids are normal. Pupils are equal, round, and reactive to light. Right eye exhibits no discharge. Left eye exhibits no discharge. No scleral icterus.  Neck: Trachea normal and normal range of motion. Neck supple. No JVD present. No spinous  process tenderness and no muscular tenderness present. Carotid bruit is not present. No tracheal deviation present. No thyromegaly present.  Cardiovascular: Normal rate, normal heart sounds and intact distal pulses.  An irregularly irregular rhythm present. Exam reveals no gallop and no friction rub.   No murmur heard. Respiratory: Effort normal and breath sounds normal. No stridor. No respiratory distress. He has no wheezes. He has no rales. He exhibits no tenderness, no bony tenderness, no laceration and no crepitus.  GI: Soft. Normal appearance and bowel sounds are normal. He exhibits no distension. There is no tenderness. There is no rigidity, no rebound, no guarding and no CVA tenderness.  Musculoskeletal: Normal range of motion. He exhibits no edema and no tenderness.  Lymphadenopathy:    He has no cervical adenopathy.  Neurological: He is alert and oriented to person, place, and time. He has normal strength. No cranial nerve deficit or sensory deficit. GCS eye subscore is 4. GCS verbal subscore is 5. GCS motor subscore is 6.  Skin: Skin is warm, dry and intact. He is not diaphoretic.  Psychiatric: He has a normal mood and affect. His speech is normal and behavior is normal.    Assessment/Plan: Fall Concussion -- Recommend PT/OT evals  prior to d/c home. Scalp lac -- Local care. He may return to the trauma clinic 8/15 for staple removal. Appt card given. Dizziness -- This sounds fairly classic for BPPV, though I can't explain the correlation with the medication. I recommend he be evaluated by PT here to see if they can elicit symptoms and provide treatment. New onset afib -- Awaiting consult from IM/cards  Trauma will sign off. As long as he is safe from a PT standpoint he can discharge from our standpoint. Please call with questions.   Jonathan Caldron, PA-C Pager: [phone] [facility] Tra[facility] Pager: [phone]  12/19/2011, 5:28 PM

## 2011-12-19 NOTE — H&P (Signed)
History and Physical Examination  Date: 12/19/2011  Patient name: Jonathan Hensley Medical record number: [id number] Date of birth: July 09, 1940 Age: 71 y.o. Gender: male PCP: Cardiologist: Dr. Jacinto Halim  Chief Complaint:  Chief Complaint  Patient presents with  . Fall    History of Present Illness: Jonathan Hensley is an 71 y.o. male with hypertension, type 2 DM with complications, hyperlipidemia and CAD who sustained a mechanical fall down 2-3 stairs when he tripped over something at a work site. He was actively working with his son at the time and said that he had been feeling well.  The event was witnessed by his son.  He struck the back of his head on some scaffolding as he fell.  He landed hard on the floor.  There was a LOC. He is amnestic to the event. He came in as a level 2 trauma due to decreased GSS but it improved to normal during his time in the ED. He had an occipital scalp laceration that was difficult to control the bleeding but had stopped by the time I saw him. He normally takes aspirin.  He denies any pain, N/V, or other post-concussive symptoms. The patient's main complaint is episodic vertigo that is preceded by tinnitus and lasts for 1-2 minutes. He was found to be in Afib with RVR with a heart rate in the 130s.  He has no known history of atrial fibrillation and says that he had recently seen his cardiologist in June 2013.  He was started on IV diltiazem and his heart rate corrected to 70-90s.  He was seen by the trauma service and staples were placed to close his occipital scalp laceration.  A hospitalization was requested for treatment and management of his atrial fibrillation with RVR.    Past Medical History Past Medical History  Diagnosis Date  . History of prostate cancer   . Hypertension   . Diabetes mellitus   . Nephrolithiasis   . Diabetic retinopathy   . Bulging lumbar disc     Past Surgical History Past Surgical History  Procedure Date  . Back surgery   .  Brachytherapy   . Cystoscopy   . Left l4-l5 lumbar laminotomy and microdiskectomy with 07/14/2008    . Scleral buckle, laser photocoagulation, and anterior chamber 01/04/2009  . Bilateral l4-5 lumbar decompression including bilateral 11/2008    Home Meds: Prior to Admission medications   Medication Sig Start Date End Date Taking? Authorizing Provider  aspirin EC 81 MG tablet Take 81 mg by mouth daily.   Yes Historical Provider, MD  benazepril (LOTENSIN) 40 MG tablet Take 40 mg by mouth daily.     Yes Historical Provider, MD  ezetimibe-simvastatin (VYTORIN) 10-20 MG per tablet Take 1 tablet by mouth at bedtime.     Yes Historical Provider, MD  glipiZIDE (GLUCOTROL) 10 MG tablet Take 10 mg by mouth 2 (two) times daily before a meal.     Yes Historical Provider, MD  insulin lispro (HUMALOG) 100 UNIT/ML injection Inject 0-10 Units into the skin 3 (three) times daily before meals. Per sliding scale   Yes Historical Provider, MD  insulin NPH (HUMULIN N,NOVOLIN N) 100 UNIT/ML injection Inject 5-20 Units into the skin 2 (two) times daily. Take 5 units in the morning and 20 units at night   Yes Historical Provider, MD  metFORMIN (GLUCOPHAGE) 1000 MG tablet Take 1,000 mg by mouth 2 (two) times daily with a meal.     Yes Historical Provider, MD  Allergies: Other  Social History:  History   Social History  . Marital Status: Married    Spouse Name: N/A    Number of Children: N/A  . Years of Education: N/A   Occupational History  . Not on file.   Social History Main Topics  . Smoking status: Not on file  . Smokeless tobacco: Not on file  . Alcohol Use:   . Drug Use:   . Sexually Active:    Other Topics Concern  . Not on file   Social History Narrative  . No narrative on file   Family History: No family history on file.  Review of Systems: Pertinent items are noted in HPI. All other systems reviewed and reported as negative.   Physical Exam: Blood pressure 131/64, pulse 60,  resp. rate 16, SpO2 98.00%. General appearance: alert, cooperative and appears stated age Head: Normocephalic, large stapled posterior scalp laceration and copious dried blood  Eyes: negative Nose: Nares normal. Septum midline. Mucosa normal. No drainage or sinus tenderness., no discharge Throat: dry MM Neck: no adenopathy, no carotid bruit, no JVD, supple, symmetrical, trachea midline and thyroid not enlarged, symmetric, no tenderness/mass/nodules Back: tenderness Lumbar spine Lungs: clear to auscultation bilaterally Chest wall: no tenderness Heart: irregularly irregular rhythm Abdomen: soft, non-tender; bowel sounds normal; no masses,  no organomegaly Extremities: extremities normal, atraumatic, no cyanosis or edema Skin: Skin color, texture, turgor normal. No rashes or lesions Neurologic: Grossly normal  Lab  And Imaging results:  Results for orders placed during the hospital encounter of 12/19/11 (from the past 24 hour(s))  COMPREHENSIVE METABOLIC PANEL     Status: Abnormal   Collection Time   12/19/11  2:17 PM      Component Value Range   Sodium 139  135 - 145 mEq/L   Potassium 3.6  3.5 - 5.1 mEq/L   Chloride 101  96 - 112 mEq/L   CO2 27  19 - 32 mEq/L   Glucose, Bld 178 (*) 70 - 99 mg/dL   BUN 22  6 - 23 mg/dL   Creatinine, Ser 1.61  0.50 - 1.35 mg/dL   Calcium 9.6  8.4 - 09.6 mg/dL   Total Protein 7.4  6.0 - 8.3 g/dL   Albumin 3.9  3.5 - 5.2 g/dL   AST 23  0 - 37 U/L   ALT 19  0 - 53 U/L   Alkaline Phosphatase 48  39 - 117 U/L   Total Bilirubin 0.2 (*) 0.3 - 1.2 mg/dL   GFR calc non Af Amer 66 (*) >90 mL/min   GFR calc Af Amer 77 (*) >90 mL/min  CBC     Status: Abnormal   Collection Time   12/19/11  2:17 PM      Component Value Range   WBC 7.2  4.0 - 10.5 K/uL   RBC 4.10 (*) 4.22 - 5.81 MIL/uL   Hemoglobin 12.1 (*) 13.0 - 17.0 g/dL   HCT 04.5 (*) 40.9 - 81.1 %   MCV 88.5  78.0 - 100.0 fL   MCH 29.5  26.0 - 34.0 pg   MCHC 33.3  30.0 - 36.0 g/dL   RDW 91.4  78.2 -  95.6 %   Platelets 220  150 - 400 K/uL  PROTIME-INR     Status: Normal   Collection Time   12/19/11  2:17 PM      Component Value Range   Prothrombin Time 11.9  11.6 - 15.2 seconds   INR 0.86  0.00 - 1.49  LACTIC ACID, PLASMA     Status: Normal   Collection Time   12/19/11  2:18 PM      Component Value Range   Lactic Acid, Venous 1.2  0.5 - 2.2 mmol/L  SAMPLE TO BLOOD BANK     Status: Normal   Collection Time   12/19/11  2:20 PM      Component Value Range   Blood Bank Specimen SAMPLE AVAILABLE FOR TESTING     Sample Expiration 12/20/2011    URINALYSIS, WITH MICROSCOPIC     Status: Abnormal   Collection Time   12/19/11  2:25 PM      Component Value Range   Color, Urine YELLOW  YELLOW   APPearance CLEAR  CLEAR   Specific Gravity, Urine 1.011  1.005 - 1.030   pH 7.0  5.0 - 8.0   Glucose, UA NEGATIVE  NEGATIVE mg/dL   Hgb urine dipstick MODERATE (*) NEGATIVE   Bilirubin Urine NEGATIVE  NEGATIVE   Ketones, ur NEGATIVE  NEGATIVE mg/dL   Protein, ur 30 (*) NEGATIVE mg/dL   Urobilinogen, UA 0.2  0.0 - 1.0 mg/dL   Nitrite NEGATIVE  NEGATIVE   Leukocytes, UA NEGATIVE  NEGATIVE   RBC / HPF 21-50  <3 RBC/hpf   Bacteria, UA RARE  RARE   Squamous Epithelial / LPF RARE  RARE  POCT I-STAT TROPONIN I     Status: Normal   Collection Time   12/19/11  2:43 PM      Component Value Range   Troponin i, poc 0.00  0.00 - 0.08 ng/mL   Comment 3           POCT I-STAT, CHEM 8     Status: Abnormal   Collection Time   12/19/11  2:45 PM      Component Value Range   Sodium 140  135 - 145 mEq/L   Potassium 3.6  3.5 - 5.1 mEq/L   Chloride 104  96 - 112 mEq/L   BUN 22  6 - 23 mg/dL   Creatinine, Ser 1.47  0.50 - 1.35 mg/dL   Glucose, Bld 829 (*) 70 - 99 mg/dL   Calcium, Ion 5.62  1.30 - 1.30 mmol/L   TCO2 24  0 - 100 mmol/L   Hemoglobin 12.6 (*) 13.0 - 17.0 g/dL   HCT 86.5 (*) 78.4 - 69.6 %     Impression   *Atrial fibrillation with RVR  New onset atrial fibrillation  Occipital scalp laceration   Mechanical Fall  DM type 2 (diabetes mellitus, type 2), insulin requiring with multiple complications  Hypertension  History of prostate cancer s/p radiation and seeds  Diabetic retinopathy associated with type 2 diabetes mellitus  Hyperlipidemia LDL goal < 70   Plan  From the history, this was likely a mechanical fall and a syncopal event did not occur. The atrial fibrillation may be an incidental finding.  I called and spoke with pt's primary cardiologist Dr. Jacinto Halim who has agreed to see the patient in consultation.  He has recommended continuing the IV diltiazem drip.  That patient is going to be admitted to the step down unit for further monitoring and treatment.  Will monitor BS closely, provide supplemental insulin as needed, check TSH, echocardiogram, cycle cardiac enzymes, check carotid dopplers, lipid panel, A1c, get PT/OT evaluation; Will follow lytes and CBC;  Pt will need to follow up in the trauma clinic to have staples removed (he was already given an appointment by  the trauma team).  Please see orders and follow hospital course.    Standley Dakins MD [facility] [place], [place] [phone] 12/19/2011, 6:44 PM

## 2011-12-19 NOTE — ED Notes (Signed)
CSW responded to trauma page. 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 Pt alert and oriented in trauma bay. Pt is married, lives with his wife, is a "retired [employer] manager" and does not like to be in the hospital per his own and family's report.  CSW assisted family to bedside and offered support to Pt's wife as pt's tx evaluation and plan progress. No further CSW needs at this time.   Frederico Hamman, LCSW  [phone]

## 2011-12-19 NOTE — ED Notes (Signed)
Christine from [phone] took report

## 2011-12-19 NOTE — ED Notes (Signed)
Called report to [phone].  Wynona Canes will be RN but not available.  Will return call

## 2011-12-19 NOTE — ED Notes (Signed)
Family reports pt was witnessed tripping over steps, falling, then loss of consciousness. Pt does not remember any events prior to fall or about fall. Pt alert and oriented x 4 on arrival.

## 2011-12-19 NOTE — ED Notes (Signed)
MD at bedside. 

## 2011-12-19 NOTE — ED Notes (Signed)
Urine has been provided by patient. Urine is by the bedside.

## 2011-12-19 NOTE — ED Provider Notes (Signed)
History     CSN: [id number]  Arrival date & time 12/19/11  1351   First MD Initiated Contact with Patient 12/19/11 1410      Chief Complaint  Patient presents with  . Fall    (Consider location/radiation/quality/duration/timing/severity/associated sxs/prior treatment) Patient is a 71 y.o. male presenting with fall. The history is provided by the patient and the EMS personnel.  Fall The accident occurred less than 1 hour ago. Incident: down 3 stairs. He landed on a hard floor. The volume of blood lost was moderate. The point of impact was the head. The patient is experiencing no pain. He was not ambulatory at the scene. There was no entrapment after the fall. There was no drug use involved in the accident. There was no alcohol use involved in the accident. Associated symptoms include loss of consciousness. Pertinent negatives include no fever, no numbness, no abdominal pain, no bowel incontinence and no headaches. Associated symptoms comments: Posterior head laceration. Treatment on scene includes a c-collar and a backboard.    Past Medical History  Diagnosis Date  . Cancer   . Hypertension   . Diabetes mellitus     Past Surgical History  Procedure Date  . Back surgery     No family history on file.  History  Substance Use Topics  . Smoking status: Not on file  . Smokeless tobacco: Not on file  . Alcohol Use:       Review of Systems  Constitutional: Negative for fever.  HENT: Negative for neck pain and neck stiffness.   Eyes: Negative for visual disturbance.  Respiratory: Negative.  Negative for shortness of breath.   Cardiovascular: Negative for chest pain and palpitations.  Gastrointestinal: Negative.  Negative for abdominal pain and bowel incontinence.  Musculoskeletal: Negative.   Neurological: Positive for loss of consciousness. Negative for dizziness, syncope, light-headedness, numbness and headaches.  All other systems reviewed and are  negative.    Allergies  Other  Home Medications   Current Outpatient Rx  Name Route Sig Dispense Refill  . ASPIRIN EC 81 MG PO TBEC Oral Take 81 mg by mouth daily.    Marland Kitchen BENAZEPRIL HCL 40 MG PO TABS Oral Take 40 mg by mouth daily.      Marland Kitchen EZETIMIBE-SIMVASTATIN 10-20 MG PO TABS Oral Take 1 tablet by mouth at bedtime.      Marland Kitchen GLIPIZIDE 10 MG PO TABS Oral Take 10 mg by mouth 2 (two) times daily before a meal.      . INSULIN LISPRO (HUMAN) 100 UNIT/ML [place] SOLN Subcutaneous Inject 0-10 Units into the skin 3 (three) times daily before meals. Per sliding scale    . INSULIN ISOPHANE HUMAN 100 UNIT/ML [place] SUSP Subcutaneous Inject 5-20 Units into the skin 2 (two) times daily. Take 5 units in the morning and 20 units at night    . METFORMIN HCL 1000 MG PO TABS Oral Take 1,000 mg by mouth 2 (two) times daily with a meal.        BP 131/64  Pulse 60  Resp 16  SpO2 98%  Physical Exam  Nursing note and vitals reviewed. Constitutional: He is oriented to person, place, and time. He appears well-developed and well-nourished. No distress.  HENT:  Head: Normocephalic. Head is with laceration (actively bleeding laceration on occiput).  Right Ear: No hemotympanum.  Left Ear: No hemotympanum.  Nose: No nasal septal hematoma.  Mouth/Throat: Oropharynx is clear and moist.  Eyes: Conjunctivae and EOM are normal. Pupils are equal,  round, and reactive to light.  Neck: Neck supple. No spinous process tenderness and no muscular tenderness present.  Cardiovascular: Normal heart sounds and intact distal pulses.  An irregularly irregular rhythm present. Tachycardia present.   Pulses:      Radial pulses are 2+ on the right side, and 2+ on the left side.       Dorsalis pedis pulses are 2+ on the right side, and 2+ on the left side.       Posterior tibial pulses are 2+ on the right side, and 2+ on the left side.  Pulmonary/Chest: Effort normal and breath sounds normal. He has no wheezes. He has no rales. He exhibits  no tenderness.  Abdominal: Soft. He exhibits no distension. There is no tenderness.  Musculoskeletal: Normal range of motion. He exhibits no edema and no tenderness.  Neurological: He is alert and oriented to person, place, and time. GCS eye subscore is 4. GCS verbal subscore is 5. GCS motor subscore is 6.  Skin: Skin is warm and dry.    ED Course  Procedures (including critical care time)  Labs Reviewed  COMPREHENSIVE METABOLIC PANEL - Abnormal; Notable for the following:    Glucose, Bld 178 (*)     Total Bilirubin 0.2 (*)     GFR calc non Af Amer 66 (*)     GFR calc Af Amer 77 (*)     All other components within normal limits  CBC - Abnormal; Notable for the following:    RBC 4.10 (*)     Hemoglobin 12.1 (*)     HCT 36.3 (*)     All other components within normal limits  URINALYSIS, WITH MICROSCOPIC - Abnormal; Notable for the following:    Hgb urine dipstick MODERATE (*)     Protein, ur 30 (*)     All other components within normal limits  POCT I-STAT, CHEM 8 - Abnormal; Notable for the following:    Glucose, Bld 179 (*)     Hemoglobin 12.6 (*)     HCT 37.0 (*)     All other components within normal limits  LACTIC ACID, PLASMA  PROTIME-INR  SAMPLE TO BLOOD BANK  POCT I-STAT TROPONIN I  CDS SEROLOGY   Ct Head Wo Contrast  12/19/2011  *RADIOLOGY REPORT*  Clinical Data:  History of trauma from a fall.  Loss of consciousness.  CT HEAD WITHOUT CONTRAST CT CERVICAL SPINE WITHOUT CONTRAST  Technique:  Multidetector CT imaging of the head and cervical spine was performed following the standard protocol without intravenous contrast.  Multiplanar CT image reconstructions of the cervical spine were also generated.  Comparison:  Head CT 09/25/2009.  CT HEAD  Findings: Soft tissue thickening and high attenuation in the right parietal occipital scalp compatible with a contusion/laceration. Overlying skin staples are noted.  All lacunar infarctions in the left putamen and left extreme  capsule, unchanged.  Patchy and confluent areas of decreased attenuation throughout the deep and periventricular white matter of the cerebral hemispheres bilaterally, compatible with chronic microvascular ischemic changes (similar to prior).  No acute displaced skull fractures are identified.  No acute intracranial abnormality.  Specifically, no evidence of acute post-traumatic intracranial hemorrhage, no definite regions of acute/subacute cerebral ischemia, no focal mass, mass effect, hydrocephalus or abnormal intra or extra-axial fluid collections.  The visualized paranasal sinuses and mastoids are well pneumatized.  IMPRESSION: 1.  No acute displaced skull fractures or acute intracranial abnormalities. 2. Old left-sided lacunar infarctions and chronic microvascular ischemic  changes in the white matter, similar to prior study 09/25/2009.  CT CERVICAL SPINE  Findings: No acute displaced cervical spine fractures are noted. Mild straightening of cervical lordosis, presumably positional. Prevertebral soft tissues are normal.  Multilevel degenerative disc disease, multilevel degenerative disc disease most severe C5-C6 and C6-C7.  Mild multilevel facet arthropathy is also noted. Visualized portions of the upper thorax are unremarkable.  IMPRESSION: 1.  No evidence of significant acute traumatic injury to the cervical spine. 2.  Mild multilevel degenerative disc disease and cervical spondylosis, as above.  Original Report Authenticated By: Florencia Reasons, M.D.   Ct Chest W Contrast  12/19/2011  *RADIOLOGY REPORT*  Clinical Data:  Fall, loss of consciousness.  CT CHEST, ABDOMEN AND PELVIS WITH CONTRAST  Technique:  Multidetector CT imaging of the chest, abdomen and pelvis was performed following the standard protocol during bolus administration of intravenous contrast.  Contrast: OMNIPAQUE IOHEXOL 300 MG/ML  SOLN  Comparison:  CT of the abdomen pelvis 11/09/2010  CT CHEST  Findings:  Cardiomegaly.  Dense  coronary artery calcifications. Aorta is normal caliber.  No mediastinal hematoma.  Trace pericardial effusion.  No pleural effusion.  No confluent opacity in the lungs.  No pneumothorax.  No acute bony abnormality. No mediastinal, hilar, or axillary adenopathy.  Visualized thyroid and chest wall soft tissues unremarkable.  IMPRESSION: Mild cardiomegaly.  Dense coronary artery calcifications.  Trace pericardial effusion.  No evidence of acute injury to the chest.  CT ABDOMEN AND PELVIS  Findings:  Liver, gallbladder, spleen, pancreas, adrenals and right kidney are unremarkable.  Left kidney mildly atrophic.  Left renal artery calcifications present.  Extensive aortic and iliac calcifications.  No aneurysm.  Stomach, large and small bowel are grossly unremarkable.  Urinary bladder unremarkable.  Radiation seeds in the prostate.  No free fluid, free air or adenopathy.  Postsurgical changes from prior posterior fusion at L4-5.  No acute bony abnormality.  Small soft tissue densities are noted in the anterior subcutaneous soft tissues, possibly related to subcutaneous injections.  IMPRESSION: No acute findings in the abdomen or pelvis.  No evidence of acute injury.  Radiation seeds in the prostate.  Original Report Authenticated By: Cyndie Chime, M.D.   Ct Cervical Spine Wo Contrast  12/19/2011  *RADIOLOGY REPORT*  Clinical Data:  History of trauma from a fall.  Loss of consciousness.  CT HEAD WITHOUT CONTRAST CT CERVICAL SPINE WITHOUT CONTRAST  Technique:  Multidetector CT imaging of the head and cervical spine was performed following the standard protocol without intravenous contrast.  Multiplanar CT image reconstructions of the cervical spine were also generated.  Comparison:  Head CT 09/25/2009.  CT HEAD  Findings: Soft tissue thickening and high attenuation in the right parietal occipital scalp compatible with a contusion/laceration. Overlying skin staples are noted.  All lacunar infarctions in the left putamen  and left extreme capsule, unchanged.  Patchy and confluent areas of decreased attenuation throughout the deep and periventricular white matter of the cerebral hemispheres bilaterally, compatible with chronic microvascular ischemic changes (similar to prior).  No acute displaced skull fractures are identified.  No acute intracranial abnormality.  Specifically, no evidence of acute post-traumatic intracranial hemorrhage, no definite regions of acute/subacute cerebral ischemia, no focal mass, mass effect, hydrocephalus or abnormal intra or extra-axial fluid collections.  The visualized paranasal sinuses and mastoids are well pneumatized.  IMPRESSION: 1.  No acute displaced skull fractures or acute intracranial abnormalities. 2. Old left-sided lacunar infarctions and chronic microvascular ischemic changes in  the white matter, similar to prior study 09/25/2009.  CT CERVICAL SPINE  Findings: No acute displaced cervical spine fractures are noted. Mild straightening of cervical lordosis, presumably positional. Prevertebral soft tissues are normal.  Multilevel degenerative disc disease, multilevel degenerative disc disease most severe C5-C6 and C6-C7.  Mild multilevel facet arthropathy is also noted. Visualized portions of the upper thorax are unremarkable.  IMPRESSION: 1.  No evidence of significant acute traumatic injury to the cervical spine. 2.  Mild multilevel degenerative disc disease and cervical spondylosis, as above.  Original Report Authenticated By: Florencia Reasons, M.D.   Ct Abdomen Pelvis W Contrast  12/19/2011  *RADIOLOGY REPORT*  Clinical Data:  Fall, loss of consciousness.  CT CHEST, ABDOMEN AND PELVIS WITH CONTRAST  Technique:  Multidetector CT imaging of the chest, abdomen and pelvis was performed following the standard protocol during bolus administration of intravenous contrast.  Contrast: OMNIPAQUE IOHEXOL 300 MG/ML  SOLN  Comparison:  CT of the abdomen pelvis 11/09/2010  CT CHEST  Findings:   Cardiomegaly.  Dense coronary artery calcifications. Aorta is normal caliber.  No mediastinal hematoma.  Trace pericardial effusion.  No pleural effusion.  No confluent opacity in the lungs.  No pneumothorax.  No acute bony abnormality. No mediastinal, hilar, or axillary adenopathy.  Visualized thyroid and chest wall soft tissues unremarkable.  IMPRESSION: Mild cardiomegaly.  Dense coronary artery calcifications.  Trace pericardial effusion.  No evidence of acute injury to the chest.  CT ABDOMEN AND PELVIS  Findings:  Liver, gallbladder, spleen, pancreas, adrenals and right kidney are unremarkable.  Left kidney mildly atrophic.  Left renal artery calcifications present.  Extensive aortic and iliac calcifications.  No aneurysm.  Stomach, large and small bowel are grossly unremarkable.  Urinary bladder unremarkable.  Radiation seeds in the prostate.  No free fluid, free air or adenopathy.  Postsurgical changes from prior posterior fusion at L4-5.  No acute bony abnormality.  Small soft tissue densities are noted in the anterior subcutaneous soft tissues, possibly related to subcutaneous injections.  IMPRESSION: No acute findings in the abdomen or pelvis.  No evidence of acute injury.  Radiation seeds in the prostate.  Original Report Authenticated By: Cyndie Chime, M.D.   Dg Pelvis Portable  12/19/2011  *RADIOLOGY REPORT*  Clinical Data: Laceration to head, fall, trauma.  PORTABLE PELVIS  Comparison: CT 11/09/2010  Findings: Radiation seeds noted in the region of the prostate. Posterior fusion changes in the lower lumbar spine.  Mild joint space narrowing within the hips. No acute bony abnormality. Specifically, no fracture, subluxation, or dislocation.  Soft tissues are intact.  SI joints are symmetric and unremarkable.  IMPRESSION: No acute bony abnormality.  Original Report Authenticated By: Cyndie Chime, M.D.   Dg Chest Port 1 View  12/19/2011  *RADIOLOGY REPORT*  Clinical Data: Fall, laceration to head.   PORTABLE CHEST - 1 VIEW  Comparison: 03/03/2011  Findings: Mild cardiomegaly.  Lungs are clear.  No effusions.  No acute bony abnormality.  IMPRESSION: Mild cardiomegaly.  No acute findings.  Original Report Authenticated By: Cyndie Chime, M.D.     1. Fall   2. Laceration of head   3. Atrial fibrillation       MDM  71 yo male who presents to the ED as a Level II Trauma Code s/p fall down 3 stairs.  Per EMS there was a possible syncopal event.  Positive LOC and laceration to the occiput.  Pt alert and oriented on arrival.  GCS  15.  Laceration to the posterior head actively bleeding.  Bleeding not controlled with staples.  Quick clot and pressure dressing applied.  Pt also noted to be in Afib.  He denies history of Afib.  Vital signs otherwise stable.  Remainder of secondary survey remarkable only for some hematuria.  Will get full trauma labs including cardiac enzymes and trauma scans.  Will give diltiazem bolus and and start on gtt.  Labs wnl.  EKG with Afib.  HR improved to the 90's on diltiazem gtt.  CT head without new acute intracranial injury.  CT C-spine negative.  CT chest/abdomen/pelvis with small pericardial effusion.  No other injuries identified.    Pt was discussed with Trauma Surgery who will see pt in ED for evaluation of head laceration.  Recommend medicine admission for further Afib work-up.    Will admit pt to step-down unit under Hospitalist service.        Cherre Robins, MD 12/19/11 [phone]

## 2011-12-20 ENCOUNTER — Encounter (HOSPITAL_COMMUNITY): Payer: Self-pay

## 2011-12-20 LAB — COMPREHENSIVE METABOLIC PANEL
Albumin: 3.3 g/dL — ABNORMAL LOW (ref 3.5–5.2)
Alkaline Phosphatase: 41 U/L (ref 39–117)
BUN: 21 mg/dL (ref 6–23)
Chloride: 102 mEq/L (ref 96–112)
Creatinine, Ser: 1.15 mg/dL (ref 0.50–1.35)
GFR calc Af Amer: 72 mL/min — ABNORMAL LOW (ref >90)
Glucose, Bld: 202 mg/dL — ABNORMAL HIGH (ref 70–99)
Total Bilirubin: 0.2 mg/dL — ABNORMAL LOW (ref 0.3–1.2)

## 2011-12-20 LAB — GLUCOSE, CAPILLARY: Glucose-Capillary: 184 mg/dL — ABNORMAL HIGH (ref 70–99)

## 2011-12-20 LAB — PROTIME-INR
INR: 0.92 (ref 0.00–1.49)
Prothrombin Time: 12.6 seconds (ref 11.6–15.2)

## 2011-12-20 LAB — CBC
MCV: 89.1 fL (ref 78.0–100.0)
Platelets: 212 10*3/uL (ref 150–400)
RBC: 3.48 MIL/uL — ABNORMAL LOW (ref 4.22–5.81)
RDW: 13.6 % (ref 11.5–15.5)
WBC: 5.6 10*3/uL (ref 4.0–10.5)

## 2011-12-20 LAB — HEMOGLOBIN A1C: Hgb A1c MFr Bld: 7.5 % — ABNORMAL HIGH (ref <5.7)

## 2011-12-20 LAB — CARDIAC PANEL(CRET KIN+CKTOT+MB+TROPI)
CK, MB: 2.8 ng/mL (ref 0.3–4.0)
Relative Index: 1.3 (ref 0.0–2.5)
Total CK: 219 U/L (ref 7–232)

## 2011-12-20 LAB — MRSA PCR SCREENING: MRSA by PCR: NEGATIVE

## 2011-12-20 MED ORDER — ATORVASTATIN CALCIUM 10 MG PO TABS
10.0000 mg | ORAL_TABLET | Freq: Every day | ORAL | Status: DC
  Filled 2011-12-20: qty 1

## 2011-12-20 MED ORDER — PNEUMOCOCCAL VAC POLYVALENT 25 MCG/0.5ML IJ INJ: 0.5000 mL | INJECTION | INTRAMUSCULAR | Status: DC

## 2011-12-20 MED ORDER — EZETIMIBE 10 MG PO TABS
10.0000 mg | ORAL_TABLET | Freq: Every day | ORAL | Status: DC
  Filled 2011-12-20: qty 1

## 2011-12-20 NOTE — Care Management Note (Signed)
    Page 1 of 1   12/21/2011     11:45:05 AM   CARE MANAGEMENT NOTE 12/21/2011  Patient:  Jonathan Hensley, Jonathan Hensley   Account Number:  [id number]  Date Initiated:  12/20/2011  Documentation initiated by:  Donn Pierini  Subjective/Objective Assessment:   Pt admitted s/p fall with afib RVR     Action/Plan:   PTA pt lived at home with spouse, independent- PT eval   Anticipated DC Date:  12/21/2011   Anticipated DC Plan:  HOME/SELF CARE      DC Planning Services  CM consult      Choice offered to / List presented to:             Status of service:  Completed, signed off Medicare Important Message given?   (If response is "NO", the following Medicare IM given date fields will be blank) Date Medicare IM given:   Date Additional Medicare IM given:    Discharge Disposition:  HOME/SELF CARE  Per UR Regulation:  Reviewed for med. necessity/level of care/duration of stay  If discussed at Long Length of Stay Meetings, dates discussed:    Comments:  12/20/11- 1430- Donn Pierini RN, BSN [phone] Anticipate return home when medically stable, PT eval completed with no futher recommendations. NCM to follow.

## 2011-12-20 NOTE — Consult Note (Signed)
I saw the patient, participated in the history, exam and medical decision making, and concur with the physician assistant's note above.  Leah Thornberry M. Genese Quebedeaux, MD, FACS General, Bariatric, & Minimally Invasive Surgery [facility]   

## 2011-12-20 NOTE — Progress Notes (Signed)
I agree with the following treatment note after reviewing documentation.   Johnston, Serina Nichter Brynn   OTR/L Pager: [phone] Office: [phone] .   

## 2011-12-20 NOTE — Progress Notes (Signed)
[facility]th[facility]atics BP's Lying: 127/60 HR 88 Sitting: 145/65 HR 83 Standing: 130/63 HR 65

## 2011-12-20 NOTE — Progress Notes (Signed)
I agree with the following treatment note after reviewing documentation.   Johnston, Galit Urich Brynn   OTR/L Pager: [phone] Office: [phone] .   

## 2011-12-20 NOTE — Progress Notes (Signed)
*  PRELIMINARY RESULTS* Echocardiogram 2D Echocardiogram has been performed.  Jonathan Hensley 12/20/2011, 11:09 AM

## 2011-12-20 NOTE — ED Provider Notes (Signed)
  I performed a history and physical examination of Jonathan Hensley and discussed his management with Dr. Christain Sacramento.  I agree with the history, physical, assessment, and plan of care, with the following exceptions: None  On my exam this elderly male who presents after a seemingly mechanical fall was in no distress, though he had a notably bleeding wound on his scalp.  The patient also was in A. fib with RVR.  There was initial suspicion of dysrhythmia as the etiology of his fall, but the mechanism was confirmed by witnesses.  The patient has no focal neuro deficits, no other notable physical exam findings.  The patient's cervical spine was cleared after an initial evaluation.  The patient's rib x-rays were largely unremarkable for attempts to stop the bleeding with staples, topical anticoagulant was minimally successful.  Trauma Surgery was consulted for wound care. Most concerning was the patient's new dysrhythmia.  Following initial resuscitation with IVF, he required Dilt, push and drip for rate control.  This seemed to be successful, though the patient required admission to a step-down bed for further E/M.  On my exam: Cardiac: 151, afib, rvr, abnormal  Pulse ox 99%ra, normal   Date: 12/20/2011  Rate: 141  Rhythm: atrial fibrillation  QRS Axis: normal  Intervals: afib  ST/T Wave abnormalities: nonspecific T wave changes  Conduction Disutrbances:none  Narrative Interpretation:   Old EKG Reviewed: none available ABNORMAL  Following initial dilt, the HR decreased into the 110's.  CRITICAL CARE Performed by: Gerhard Munch   Total critical care time: 40  Critical care time was exclusive of separately billable procedures and treating other patients.  Critical care was necessary to treat or prevent imminent or life-threatening deterioration.  Critical care was time spent personally by me on the following activities: development of treatment plan with patient and/or surrogate as well as  nursing, discussions with consultants, evaluation of patient's response to treatment, examination of patient, obtaining history from patient or surrogate, ordering and performing treatments and interventions, ordering and review of laboratory studies, ordering and review of radiographic studies, pulse oximetry and re-evaluation of patient's condition.    Elyse Jarvis, MD 12/20/11 [phone]

## 2011-12-20 NOTE — Progress Notes (Signed)
Occupational Therapy Evaluation Patient Details Name: Jonathan Hensley MRN: [id number] DOB: 11/24/1940 Today's Date: 12/20/2011 Time: 4098-1191 OT Time Calculation (min): 16 min  OT Assessment / Plan / Recommendation Clinical Impression  71 y.o. s/p fall and struck back of his head. Pt. did excellent in sesion and is at Mod I for ADLs assessed. Pt. does not need further OT services. OT to sign off.    OT Assessment  Patient does not need any further OT services    Follow Up Recommendations  No OT follow up    Barriers to Discharge      Equipment Recommendations  None recommended by OT    Recommendations for Other Services    Frequency       Precautions / Restrictions Restrictions Weight Bearing Restrictions: No   Pertinent Vitals/Pain No pain reported.     ADL  Lower Body Dressing: Performed;Modified independent Where Assessed - Lower Body Dressing: Unsupported sitting Toilet Transfer: Simulated;Modified independent Toilet Transfer Method: Sit to stand Toilet Transfer Equipment: Regular height toilet ADL Comments: Pt. able to ambulate with Mod I in hallway. He donned sock with Mod I. Pt. did well and feels he is at baseline.     OT Goals    Visit Information  Last OT Received On: 12/20/11 Assistance Needed: +1 PT/OT Co-Evaluation/Treatment: Yes    Subjective Data  Subjective: "I feel fine."   Prior Functioning  Vision/Perception  Home Living Lives With: Family Available Help at Discharge: Family Type of Home: House Home Access: Stairs to enter Secretary/administrator of Steps: 2-3 Entrance Stairs-Rails: None Home Layout: Two level Bathroom Shower/Tub: Forensic scientist: Standard Bathroom Accessibility: Yes How Accessible: Accessible via walker Home Adaptive Equipment: Grab bars in shower;Straight cane Prior Function Level of Independence: Independent Able to Take Stairs?: Yes Driving: Yes Vocation:  Retired Musician: No difficulties Dominant Hand: Right      Cognition  Overall Cognitive Status: Appears within functional limits for tasks assessed/performed Arousal/Alertness: Awake/alert Orientation Level: Appears intact for tasks assessed Behavior During Session: [facility] for tasks performed    Extremity/Trunk Assessment Right Upper Extremity Assessment RUE ROM/Strength/Tone: Within functional levels Left Upper Extremity Assessment LUE ROM/Strength/Tone: Within functional levels   Mobility Bed Mobility Bed Mobility: Supine to Sit;Sitting - Scoot to Edge of Bed;Sit to Supine Supine to Sit: 6: Modified independent (Device/Increase time) Sitting - Scoot to Edge of Bed: 6: Modified independent (Device/Increase time) Sit to Supine: 6: Modified independent (Device/Increase time) Transfers Transfers: Sit to Stand;Stand to Sit Sit to Stand: From bed;6: Modified independent (Device/Increase time) Stand to Sit: To bed;6: Modified independent (Device/Increase time)           End of Session OT - End of Session Activity Tolerance: Patient tolerated treatment well Patient left: in bed;with call bell/phone within reach;with family/visitor present  GO     Cassundra Mckeever, Mardella Layman 12/20/2011, 11:51 AM

## 2011-12-20 NOTE — Consult Note (Signed)
CARDIOLOGY CONSULT NOTE  Patient ID: Jonathan Hensley MRN: [id number] DOB/AGE: 71-May-1942 71 y.o.  Admit date: 12/19/2011 Referring Physician [facility] Primary Physician: No primary provider on file. Reason for Consultation A. Fibrillation.  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 He was brought to the emergency room via EMS. While in the emergency room, he was found to be in atrial fibrillation with rapid ventricular response. He was started on IV Cardizem. He was kept overnight for observation. He converted spontaneously to sinus rhythm in the emergency department. His IV Cardizem was discontinued. Patient denies any chest pain, shortness of breath, PND or orthopnea. He denies any syncope. He denies any seizure disorder or loss of consciousness. He states that he remembers falling after he slipped against an object.  . Presently he is   asymptomatic and is having his lunch with family at the bedside.   Patient has severe intolerance to beta blockers. He pretty much dictates exactly what medications he wants and also has chronic dizziness.   Past Medical History  Diagnosis Date  . History of prostate cancer   . Hypertension   . Diabetes mellitus   . Nephrolithiasis   . Diabetic retinopathy   . Bulging lumbar disc      Past Surgical History  Procedure Date  . Back surgery   . Brachytherapy   . Cystoscopy   . Left l4-l5 lumbar laminotomy and microdiskectomy with 07/14/2008    . Scleral buckle, laser photocoagulation, and anterior chamber 01/04/2009  . Bilateral l4-5 lumbar decompression including bilateral 11/2008     History reviewed. No pertinent family history.  Social History: History   Social History  . Marital Status: Married    Spouse Name: N/A    Number of Children: N/A  . Years of Education: N/A    Occupational History  . Not on file.   Social History Main Topics  . Smoking status: Never Smoker   . Smokeless tobacco: Not on file  . Alcohol Use: Not on file  . Drug Use: No  . Sexually Active: Yes   Other Topics Concern  . Not on file   Social History Narrative  . No narrative on file     Prescriptions prior to admission  Medication Sig Dispense Refill  . aspirin EC 81 MG tablet Take 81 mg by mouth daily.      . benazepril (LOTENSIN) 40 MG tablet Take 40 mg by mouth daily.        Marland Kitchen ezetimibe-simvastatin (VYTORIN) 10-20 MG per tablet Take 1 tablet by mouth at bedtime.        Marland Kitchen glipiZIDE (GLUCOTROL) 10 MG tablet Take 10 mg by mouth 2 (two) times daily before a meal.        . insulin lispro (HUMALOG) 100 UNIT/ML injection Inject 0-10 Units into the skin 3 (three) times daily before meals. Per sliding scale      . insulin NPH (HUMULIN N,NOVOLIN N) 100 UNIT/ML injection Inject 5-20 Units into the skin 2 (two) times daily. Take 5 units in the morning and 20 units at night      . metFORMIN (GLUCOPHAGE) 1000 MG tablet Take 1,000 mg by mouth 2 (two) times daily with a meal.          ROS: General: no fevers/chills/night sweats Eyes: no blurry vision, diplopia, or amaurosis  ENT: no sore throat or hearing loss Resp: no cough, wheezing, or hemoptysis CV: no edema or palpitations GI: no abdominal pain, nausea, vomiting, diarrhea, or constipation GU: no dysuria, frequency, or hematuria Skin: no rash Neuro: no headache, numbness, tingling, or weakness of extremities Musculoskeletal:  has pain in the back of his head at the laceration site. Otherwise moves all 4 extremities without any discomfort.  Heme: no bleeding, DVT, or easy bruising Endo: no polydipsia or polyuria    Physical Exam: Blood pressure 123/56, pulse 64, temperature 98.7 F (37.1 C), temperature source Oral, resp. rate 16, height 6' (1.829 m), weight 104.5 kg (230 lb 6.1 oz), SpO2 97.00%.      general exam: Well  built and well nourished appears to be in no acute distress, alert oriented x3. Neck: No masses, no thyromegaly, no JVD. Head: Has a stable wound in the occipital region. Staples evident. Heart: S1-S2 is normal there is no gallop or murmur. Chest: Clear to auscultate. No tenderness. Extremity: Full range of motion, no deformity, no edema. Labs:   Lab Results  Component Value Date   WBC 5.6 12/20/2011   HGB 10.5* 12/20/2011   HCT 31.0* 12/20/2011   MCV 89.1 12/20/2011   PLT 212 12/20/2011    Lab 12/20/11 0550  NA 138  K 4.3  CL 102  CO2 27  BUN 21  CREATININE 1.15  CALCIUM 9.0  PROT 6.3  BILITOT 0.2*  ALKPHOS 41  ALT 16  AST 17  GLUCOSE 202*   Lab Results  Component Value Date   CKTOTAL 219 12/20/2011   CKMB 2.8 12/20/2011   TROPONINI <0.30 12/20/2011     Radiology:   EKG: Performed on 12/20/2011 reveals normal sinus rhythm at a rate of 59 beats per minute, normal axis, normal intervals. No evidence of ischemia. Normal EKG.  ASSESSMENT AND PLAN:  1. A. Fibrillation with RVR: Patient spontaneously converted to sinus rhythm. Patient is presently in sinus rhythm. Echocardiogram done this morning essentially reveals normal LV systolic function without significant valvular abnormalities. I suspect atrial fibrillation was a incidental finding after a fall. 2. Hypertension controlled 3. Chronic dizziness and intolerance to beta blockers. Patient very much dictating what he wants. 4. Diabetes mellitus type 2 controlled  Recommendation: From cardiac standpoint he can be discharged home. This the first episode of atrial fibrillation. After his discharge I will have him come to our office for a event monitoring for 30 days. I will simply see him back again in 6 months as scheduled unless he has any other issues and I will see him back sooner. Patient is aware and knows how to get in touch with me. Our office will contact him in the next 24-48 hours for an elective event  monitoring.  Pamella Pert, MD 12/20/2011, 1:06 PM

## 2011-12-20 NOTE — Evaluation (Addendum)
@@@  No further PT needs--D/C@@@  Physical Therapy Evaluation Patient Details Name: Jonathan Hensley MRN: [id number] DOB: Jul 18, 1940 Today's Date: 12/20/2011 Time: 1191-4782 PT Time Calculation (min): 16 min  PT Assessment / Plan / Recommendation Clinical Impression  pt suffered a mechanical fall striking the back of his head nning stapels.  On eval, mobility was at I level and there are no further needs for Pt at this time.  D/C from PT.    PT Assessment  Patent does not need any further PT services    Follow Up Recommendations  No PT follow up    Barriers to Discharge        Equipment Recommendations  None recommended by PT    Recommendations for Other Services     Frequency      Precautions / Restrictions Precautions Precautions: None Restrictions Weight Bearing Restrictions: No   Pertinent Vitals/Pain       Mobility  Bed Mobility Bed Mobility: Supine to Sit;Sitting - Scoot to Edge of Bed;Sit to Supine Supine to Sit: 7: Independent Sitting - Scoot to Edge of Bed: 7: Independent Sit to Supine: 7: Independent Transfers Transfers: Sit to Stand;Stand to Sit Sit to Stand: 7: Independent;From bed Stand to Sit: 7: Independent;To bed Ambulation/Gait Ambulation/Gait Assistance: 7: Independent Ambulation Distance (Feet): 300 Feet Assistive device: None Ambulation/Gait Assistance Details: steady with no deviation even with high level balance tasks Gait Pattern: Within Functional Limits Stairs: Yes    Exercises     PT Diagnosis:    PT Problem List:   PT Treatment Interventions:     PT Goals    Visit Information  Last PT Received On: 12/20/11 Assistance Needed: +1    Subjective Data  Subjective: I do everything for myself Patient Stated Goal: Get out of here   Prior Functioning  Home Living Lives With: Family Available Help at Discharge: Family Type of Home: House Home Access: Stairs to enter Secretary/administrator of Steps: 2-3 Entrance Stairs-Rails:  None Home Layout: Two level Bathroom Shower/Tub: Forensic scientist: Standard Bathroom Accessibility: Yes How Accessible: Accessible via walker Home Adaptive Equipment: Grab bars in shower;Straight cane Prior Function Level of Independence: Independent Able to Take Stairs?: Yes Driving: Yes Vocation: Retired Musician: No difficulties Dominant Hand: Right    Cognition  Overall Cognitive Status: Appears within functional limits for tasks assessed/performed Arousal/Alertness: Awake/alert Orientation Level: Appears intact for tasks assessed Behavior During Session: [facility] for tasks performed    Extremity/Trunk Assessment Right Upper Extremity Assessment RUE ROM/Strength/Tone: Within functional levels Left Upper Extremity Assessment LUE ROM/Strength/Tone: Within functional levels Right Lower Extremity Assessment RLE ROM/Strength/Tone: Within functional levels Left Lower Extremity Assessment LLE ROM/Strength/Tone: Within functional levels Trunk Assessment Trunk Assessment: Normal   Balance    End of Session PT - End of Session Activity Tolerance: Patient tolerated treatment well Patient left: in bed;with call bell/phone within reach;with family/visitor present Nurse Communication: Mobility status  GP     Annalisse Minkoff, Eliseo Gum 12/20/2011, 12:08 PM  12/20/2011  [place] Bing, PT [phone] (pager)

## 2011-12-20 NOTE — Progress Notes (Signed)
Occupational Therapy Discharge Patient Details Name: JOMEL WHITTLESEY MRN: [id number] DOB: [birth year] Today's Date: 12/20/2011 Time: 2536-6440 OT Time Calculation (min): 16 min  Patient discharged from OT services secondary to goals met and no further OT needs identified.  Please see latest therapy progress note for current level of functioning and progress toward goals.    Progress and discharge plan discussed with patient and/or caregiver: Patient/Caregiver agrees with plan  GO     Jenell Milliner 12/20/2011, 11:52 AM

## 2011-12-20 NOTE — Progress Notes (Signed)
Utilization review completed.  

## 2011-12-20 NOTE — Discharge Summary (Signed)
DISCHARGE SUMMARY  Jonathan Hensley  MR#: [id number]  DOB:1940-10-28  Date of Admission: 12/19/2011 Date of Discharge: 12/20/2011  Attending Physician:MCCLUNG,JEFFREY T  Patient's PCP:No primary provider on file.  Consults:Treatment Team:  Pamella Pert, MD  Disposition: Discharge home  Follow-up Appts: Discharge Orders    Future Appointments: Provider: Department: Dept Phone: Center:   12/28/2011 2:30 PM [facility] [phone] None     Discharge Diagnoses: Present on Admission:  .New onset atrial fibrillation with RVR .Occipital scalp laceration .Fall .DM type 2 (diabetes mellitus, type 2) .Hypertension .Diabetic retinopathy associated with type 2 diabetes mellitus .Hyperlipidemia LDL goal < 70  Initial presentation: Jonathan Hensley is an 71 y.o. male with hypertension, type 2 DM with complications, hyperlipidemia and CAD who sustained a mechanical fall down 2-3 stairs when he tripped over something at a work site. He was actively working with his son at the time and said that he had been feeling well. The event was witnessed by his son. He struck the back of his head on some scaffolding as he fell. He landed hard on the floor. There was a LOC. He is amnestic to the event. He came in as a level 2 trauma due to decreased GSS but it improved to normal during his time in the ED. He had an occipital scalp laceration that was difficult to control the bleeding but had stopped by the time I saw him. He normally takes aspirin. He denies any pain, N/V, or other post-concussive symptoms. The patient's main complaint is episodic vertigo that is preceded by tinnitus and lasts for 1-2 minutes. He was found to be in Afib with RVR with a heart rate in the 130s. He has no known history of atrial fibrillation and says that he had recently seen his cardiologist in June 2013. He was started on IV diltiazem and his heart rate corrected to 70-90s. He was seen by the trauma  service and staples were placed to close his occipital scalp laceration. A hospitalization was requested for treatment and management of his atrial fibrillation with RVR  Hospital Course:  Atrial fibrillation with rapid ventricular response - new onset Rate was able to be controlled with intravenous medications - the patient then spontaneously converted to sinus rhythm and remained in sinus rhythm the remainder of his hospital stay - he was evaluated by Dr. Jeanella Cara who cleared the patient for discharge from a cardiac standpoint and arranged for outpatient followup to include an event monitor   Occipital scalp laceration The patient has staples in place in the wound is stable at the time of his - he is to followup in the trauma clinic as detailed above  Mechanical fall No evidence of syncope - echocardiogram without significant findings  Diabetes mellitus type 2 No change in treatment plan  Hypertension No change in treatment plan - it should be noted the patient is severely intolerant of beta blockers  Hyperlipidemia  Medication List  As of 12/20/2011  2:48 PM   TAKE these medications         aspirin EC 81 MG tablet   Take 81 mg by mouth daily.      benazepril 40 MG tablet   Commonly known as: LOTENSIN   Take 40 mg by mouth daily.      ezetimibe-simvastatin 10-20 MG per tablet   Commonly known as: VYTORIN   Take 1 tablet by mouth at bedtime.      glipiZIDE 10 MG tablet  Commonly known as: GLUCOTROL   Take 10 mg by mouth 2 (two) times daily before a meal.      insulin lispro 100 UNIT/ML injection   Commonly known as: HUMALOG   Inject 0-10 Units into the skin 3 (three) times daily before meals. Per sliding scale      insulin NPH 100 UNIT/ML injection   Commonly known as: HUMULIN N,NOVOLIN N   Inject 5-20 Units into the skin 2 (two) times daily. Take 5 units in the morning and 20 units at night      metFORMIN 1000 MG tablet   Commonly known as: GLUCOPHAGE   Take 1,000 mg  by mouth 2 (two) times daily with a meal.           Day of Discharge BP 123/56  Pulse 64  Temp 98.7 F (37.1 C) (Oral)  Resp 16  Ht 6' (1.829 m)  Wt 104.5 kg (230 lb 6.1 oz)  BMI 31.25 kg/m2  SpO2 97%  Physical Exam: General: No acute respiratory distress - occipital region scalp laceration stapled and intact without active bleeding erythema or fluctuance Lungs: Clear to auscultation bilaterally without wheezes or crackles Cardiovascular: Regular rate and rhythm without murmur gallop or rub  Abdomen: Nontender, nondistended, soft, bowel sounds positive, no rebound, no ascites, no appreciable mass Extremities: No significant cyanosis, clubbing, or edema bilateral lower extremities  Results for orders placed during the hospital encounter of 12/19/11 (from the past 24 hour(s))  POCT I-STAT TROPONIN I     Status: Normal   Collection Time   12/19/11  2:43 PM      Component Value Range   Troponin i, poc 0.00  0.00 - 0.08 ng/mL   Comment 3           POCT I-STAT, CHEM 8     Status: Abnormal   Collection Time   12/19/11  2:45 PM      Component Value Range   Sodium 140  135 - 145 mEq/L   Potassium 3.6  3.5 - 5.1 mEq/L   Chloride 104  96 - 112 mEq/L   BUN 22  6 - 23 mg/dL   Creatinine, Ser 1.61  0.50 - 1.35 mg/dL   Glucose, Bld 096 (*) 70 - 99 mg/dL   Calcium, Ion 0.45  4.09 - 1.30 mmol/L   TCO2 24  0 - 100 mmol/L   Hemoglobin 12.6 (*) 13.0 - 17.0 g/dL   HCT 81.1 (*) 91.4 - 78.2 %  PRO B NATRIURETIC PEPTIDE     Status: Abnormal   Collection Time   12/19/11  9:43 PM      Component Value Range   Pro B Natriuretic peptide (BNP) 351.6 (*) 0 - 125 pg/mL  CARDIAC PANEL(CRET KIN+CKTOT+MB+TROPI)     Status: Abnormal   Collection Time   12/19/11  9:43 PM      Component Value Range   Total CK 286 (*) 7 - 232 U/L   CK, MB 4.5 (*) 0.3 - 4.0 ng/mL   Troponin I <0.30  <0.30 ng/mL   Relative Index 1.6  0.0 - 2.5  MAGNESIUM     Status: Normal   Collection Time   12/19/11  9:44 PM       Component Value Range   Magnesium 2.0  1.5 - 2.5 mg/dL  PHOSPHORUS     Status: Normal   Collection Time   12/19/11  9:44 PM      Component Value Range   Phosphorus 3.7  2.3 - 4.6 mg/dL  TSH     Status: Normal   Collection Time   12/19/11  9:44 PM      Component Value Range   TSH 2.370  0.350 - 4.500 uIU/mL  HEMOGLOBIN A1C     Status: Abnormal   Collection Time   12/19/11  9:44 PM      Component Value Range   Hemoglobin A1C 7.5 (*) <5.7 %   Mean Plasma Glucose 169 (*) <117 mg/dL  GLUCOSE, CAPILLARY     Status: Abnormal   Collection Time   12/19/11  9:44 PM      Component Value Range   Glucose-Capillary 311 (*) 70 - 99 mg/dL  MRSA PCR SCREENING     Status: Normal   Collection Time   12/19/11 11:33 PM      Component Value Range   MRSA by PCR NEGATIVE  NEGATIVE  CARDIAC PANEL(CRET KIN+CKTOT+MB+TROPI)     Status: Normal   Collection Time   12/20/11  5:50 AM      Component Value Range   Total CK 219  7 - 232 U/L   CK, MB 2.8  0.3 - 4.0 ng/mL   Troponin I <0.30  <0.30 ng/mL   Relative Index 1.3  0.0 - 2.5  COMPREHENSIVE METABOLIC PANEL     Status: Abnormal   Collection Time   12/20/11  5:50 AM      Component Value Range   Sodium 138  135 - 145 mEq/L   Potassium 4.3  3.5 - 5.1 mEq/L   Chloride 102  96 - 112 mEq/L   CO2 27  19 - 32 mEq/L   Glucose, Bld 202 (*) 70 - 99 mg/dL   BUN 21  6 - 23 mg/dL   Creatinine, Ser 1.61  0.50 - 1.35 mg/dL   Calcium 9.0  8.4 - 09.6 mg/dL   Total Protein 6.3  6.0 - 8.3 g/dL   Albumin 3.3 (*) 3.5 - 5.2 g/dL   AST 17  0 - 37 U/L   ALT 16  0 - 53 U/L   Alkaline Phosphatase 41  39 - 117 U/L   Total Bilirubin 0.2 (*) 0.3 - 1.2 mg/dL   GFR calc non Af Amer 62 (*) >90 mL/min   GFR calc Af Amer 72 (*) >90 mL/min  CBC     Status: Abnormal   Collection Time   12/20/11  5:50 AM      Component Value Range   WBC 5.6  4.0 - 10.5 K/uL   RBC 3.48 (*) 4.22 - 5.81 MIL/uL   Hemoglobin 10.5 (*) 13.0 - 17.0 g/dL   HCT 04.5 (*) 40.9 - 81.1 %   MCV 89.1  78.0 - 100.0 fL    MCH 30.2  26.0 - 34.0 pg   MCHC 33.9  30.0 - 36.0 g/dL   RDW 91.4  78.2 - 95.6 %   Platelets 212  150 - 400 K/uL  PROTIME-INR     Status: Normal   Collection Time   12/20/11  5:50 AM      Component Value Range   Prothrombin Time 12.6  11.6 - 15.2 seconds   INR 0.92  0.00 - 1.49   Time spent in discharge (includes decision making & examination of pt): 30 minutes  12/20/2011, 2:48 PM   Lonia Blood, MD [facility] Office  [phone] Pager [phone]  On-Call/Text Page:      [email or web address]      password [email or web address]

## 2011-12-26 DIAGNOSIS — I4891 Unspecified atrial fibrillation: Secondary | ICD-10-CM | POA: Diagnosis not present

## 2011-12-28 ENCOUNTER — Encounter (INDEPENDENT_AMBULATORY_CARE_PROVIDER_SITE_OTHER): Payer: Self-pay

## 2011-12-28 ENCOUNTER — Ambulatory Visit (INDEPENDENT_AMBULATORY_CARE_PROVIDER_SITE_OTHER): Payer: Medicare Other | Admitting: General Surgery

## 2011-12-28 VITALS — BP 146/78 | HR 76 | Temp 97.8°F | Resp 16 | Ht 70.5 in | Wt 236.4 lb

## 2011-12-28 DIAGNOSIS — S0101XA Laceration without foreign body of scalp, initial encounter: Secondary | ICD-10-CM

## 2011-12-28 DIAGNOSIS — S0100XA Unspecified open wound of scalp, initial encounter: Secondary | ICD-10-CM

## 2011-12-28 NOTE — Patient Instructions (Signed)
Call again as needed.  Keep area clean with soap and water.  Allow scab to come off on it's own

## 2011-12-28 NOTE — Progress Notes (Signed)
S:  Doing well no nausea, dizziness, vision changes, he is driving again without difficulty.  O:  6 staples in Posterior occipital head in a dense crusted scab. A:  Scalp laceration after fall and LOC AODM Afib followed by Dr. Jacinto Halim  P:  Staples removed, Pt to keep area clean and allow scab to come off in time.  Will see again as needed.

## 2012-01-04 DIAGNOSIS — I4891 Unspecified atrial fibrillation: Secondary | ICD-10-CM | POA: Diagnosis not present

## 2012-01-04 DIAGNOSIS — R42 Dizziness and giddiness: Secondary | ICD-10-CM | POA: Diagnosis not present

## 2012-01-04 DIAGNOSIS — I10 Essential (primary) hypertension: Secondary | ICD-10-CM | POA: Diagnosis not present

## 2012-01-04 DIAGNOSIS — E1169 Type 2 diabetes mellitus with other specified complication: Secondary | ICD-10-CM | POA: Diagnosis not present

## 2012-01-11 DIAGNOSIS — H35379 Puckering of macula, unspecified eye: Secondary | ICD-10-CM | POA: Diagnosis not present

## 2012-01-11 DIAGNOSIS — H43819 Vitreous degeneration, unspecified eye: Secondary | ICD-10-CM | POA: Diagnosis not present

## 2012-01-11 DIAGNOSIS — E1139 Type 2 diabetes mellitus with other diabetic ophthalmic complication: Secondary | ICD-10-CM | POA: Diagnosis not present

## 2012-01-11 DIAGNOSIS — H33029 Retinal detachment with multiple breaks, unspecified eye: Secondary | ICD-10-CM | POA: Diagnosis not present

## 2012-01-16 DIAGNOSIS — C61 Malignant neoplasm of prostate: Secondary | ICD-10-CM | POA: Diagnosis not present

## 2012-01-16 DIAGNOSIS — Q549 Hypospadias, unspecified: Secondary | ICD-10-CM | POA: Diagnosis not present

## 2012-01-16 DIAGNOSIS — N500 Atrophy of testis: Secondary | ICD-10-CM | POA: Diagnosis not present

## 2012-01-19 DIAGNOSIS — Z23 Encounter for immunization: Secondary | ICD-10-CM | POA: Diagnosis not present

## 2012-01-19 DIAGNOSIS — E785 Hyperlipidemia, unspecified: Secondary | ICD-10-CM | POA: Diagnosis not present

## 2012-01-19 DIAGNOSIS — I10 Essential (primary) hypertension: Secondary | ICD-10-CM | POA: Diagnosis not present

## 2012-01-19 DIAGNOSIS — D509 Iron deficiency anemia, unspecified: Secondary | ICD-10-CM | POA: Diagnosis not present

## 2012-02-16 DIAGNOSIS — I10 Essential (primary) hypertension: Secondary | ICD-10-CM | POA: Diagnosis not present

## 2012-02-16 DIAGNOSIS — E785 Hyperlipidemia, unspecified: Secondary | ICD-10-CM | POA: Diagnosis not present

## 2012-02-23 DIAGNOSIS — R42 Dizziness and giddiness: Secondary | ICD-10-CM | POA: Diagnosis not present

## 2012-02-23 DIAGNOSIS — E1169 Type 2 diabetes mellitus with other specified complication: Secondary | ICD-10-CM | POA: Diagnosis not present

## 2012-02-23 DIAGNOSIS — I4891 Unspecified atrial fibrillation: Secondary | ICD-10-CM | POA: Diagnosis not present

## 2012-03-01 DIAGNOSIS — I4891 Unspecified atrial fibrillation: Secondary | ICD-10-CM | POA: Diagnosis not present

## 2012-03-01 DIAGNOSIS — I10 Essential (primary) hypertension: Secondary | ICD-10-CM | POA: Diagnosis not present

## 2012-03-01 DIAGNOSIS — E119 Type 2 diabetes mellitus without complications: Secondary | ICD-10-CM | POA: Diagnosis not present

## 2012-03-01 DIAGNOSIS — E7800 Pure hypercholesterolemia, unspecified: Secondary | ICD-10-CM | POA: Diagnosis not present

## 2012-05-31 DIAGNOSIS — I10 Essential (primary) hypertension: Secondary | ICD-10-CM | POA: Diagnosis not present

## 2012-05-31 DIAGNOSIS — E785 Hyperlipidemia, unspecified: Secondary | ICD-10-CM | POA: Diagnosis not present

## 2012-05-31 DIAGNOSIS — D509 Iron deficiency anemia, unspecified: Secondary | ICD-10-CM | POA: Diagnosis not present

## 2012-06-07 DIAGNOSIS — Z1211 Encounter for screening for malignant neoplasm of colon: Secondary | ICD-10-CM | POA: Diagnosis not present

## 2012-07-17 DIAGNOSIS — Q549 Hypospadias, unspecified: Secondary | ICD-10-CM | POA: Diagnosis not present

## 2012-07-17 DIAGNOSIS — C61 Malignant neoplasm of prostate: Secondary | ICD-10-CM | POA: Diagnosis not present

## 2012-07-21 ENCOUNTER — Emergency Department (HOSPITAL_COMMUNITY)
Admission: EM | Admit: 2012-07-21 | Discharge: 2012-07-21 | Disposition: A | Discharge status: Home / Self Care | Payer: Medicare Other | Attending: Emergency Medicine | Admitting: Emergency Medicine

## 2012-07-21 ENCOUNTER — Encounter (HOSPITAL_COMMUNITY): Payer: Self-pay

## 2012-07-21 ENCOUNTER — Emergency Department (HOSPITAL_COMMUNITY): Payer: Medicare Other

## 2012-07-21 DIAGNOSIS — Z87442 Personal history of urinary calculi: Secondary | ICD-10-CM | POA: Insufficient documentation

## 2012-07-21 DIAGNOSIS — E1139 Type 2 diabetes mellitus with other diabetic ophthalmic complication: Secondary | ICD-10-CM | POA: Insufficient documentation

## 2012-07-21 DIAGNOSIS — Z794 Long term (current) use of insulin: Secondary | ICD-10-CM | POA: Insufficient documentation

## 2012-07-21 DIAGNOSIS — Z8546 Personal history of malignant neoplasm of prostate: Secondary | ICD-10-CM | POA: Insufficient documentation

## 2012-07-21 DIAGNOSIS — Z79899 Other long term (current) drug therapy: Secondary | ICD-10-CM | POA: Diagnosis not present

## 2012-07-21 DIAGNOSIS — I10 Essential (primary) hypertension: Secondary | ICD-10-CM | POA: Diagnosis not present

## 2012-07-21 DIAGNOSIS — E11319 Type 2 diabetes mellitus with unspecified diabetic retinopathy without macular edema: Secondary | ICD-10-CM | POA: Diagnosis not present

## 2012-07-21 DIAGNOSIS — Z23 Encounter for immunization: Secondary | ICD-10-CM | POA: Diagnosis not present

## 2012-07-21 DIAGNOSIS — Z8739 Personal history of other diseases of the musculoskeletal system and connective tissue: Secondary | ICD-10-CM | POA: Diagnosis not present

## 2012-07-21 DIAGNOSIS — S61209A Unspecified open wound of unspecified finger without damage to nail, initial encounter: Secondary | ICD-10-CM | POA: Diagnosis not present

## 2012-07-21 DIAGNOSIS — W268XXA Contact with other sharp object(s), not elsewhere classified, initial encounter: Secondary | ICD-10-CM | POA: Insufficient documentation

## 2012-07-21 DIAGNOSIS — Y939 Activity, unspecified: Secondary | ICD-10-CM | POA: Insufficient documentation

## 2012-07-21 DIAGNOSIS — Y929 Unspecified place or not applicable: Secondary | ICD-10-CM | POA: Insufficient documentation

## 2012-07-21 MED ORDER — CEPHALEXIN 500 MG PO CAPS: 500.0000 mg | ORAL_CAPSULE | Freq: Four times a day (QID) | ORAL | Status: DC

## 2012-07-21 MED ORDER — HYDROCODONE-ACETAMINOPHEN 5-325 MG PO TABS: ORAL_TABLET | ORAL | Status: DC

## 2012-07-21 MED ORDER — TETANUS-DIPHTH-ACELL PERTUSSIS 5-2.5-18.5 LF-MCG/0.5 IM SUSP
0.5000 mL | Freq: Once | INTRAMUSCULAR | Status: AC
  Administered 2012-07-21: 0.5 mL via INTRAMUSCULAR
  Filled 2012-07-21: qty 0.5

## 2012-07-21 NOTE — Discharge Instructions (Signed)
Fingertip Injuries and Amputations Fingertip injuries are common and often get injured because they are last to escape when pulling your hand out of harm's way. You have amputated (cut off) part of your finger. How this turns out depends largely on how much was amputated. If just the tip is amputated, often the end of the finger will grow back and the finger may return to much the same as it was before the injury.  If more of the finger is missing, your caregiver has done the best with the tissue remaining to allow you to keep as much finger as is possible. Your caregiver after checking your injury has tried to leave you with a painless fingertip that has durable, feeling skin. If possible, your caregiver has tried to maintain the finger's length and appearance and preserve its fingernail.  Please read the instructions outlined below and refer to this sheet in the next few weeks. These instructions provide you with general information on caring for yourself. Your caregiver may also give you specific instructions. While your treatment has been done according to the most current medical practices available, unavoidable complications occasionally occur. If you have any problems or questions after discharge, please call your caregiver. HOME CARE INSTRUCTIONS   You may resume normal diet and activities as directed or allowed.  Keep your hand elevated above the level of your heart. This helps decrease pain and swelling.  Keep ice packs (or a bag of ice wrapped in a towel) on the injured area for 15 to 20 minutes, 3 to 4 times per day, for the first two days.  Change dressings if necessary or as directed.  Clean the wound daily or as directed.  Only take over-the-counter or prescription medicines for pain, discomfort, or fever as directed by your caregiver.  Keep appointments as directed. SEEK IMMEDIATE MEDICAL CARE IF:  You develop redness, swelling, numbness or increasing pain in the wound.  There  is pus coming from the wound.  You develop an unexplained oral temperature above 102 F (38.9 C) or as your caregiver suggests.  There is a foul (bad) smell coming from the wound or dressing.  There is a breaking open of the wound (edges not staying together) after sutures or staples have been removed. MAKE SURE YOU:   Understand these instructions.  Will watch your condition.  Will get help right away if you are not doing well or get worse. Document Released: 03/22/2005 Document Revised: 07/24/2011 Document Reviewed: 02/19/2008 [facility] Patient Information 2013 [place], [place].  Laceration Care, Adult A laceration is a cut or lesion that goes through all layers of the skin and into the tissue just beneath the skin. TREATMENT  Some lacerations may not require closure. Some lacerations may not be able to be closed due to an increased risk of infection. It is important to see your caregiver as soon as possible after an injury to minimize the risk of infection and maximize the opportunity for successful closure. If closure is appropriate, pain medicines may be given, if needed. The wound will be cleaned to help prevent infection. Your caregiver will use stitches (sutures), staples, wound glue (adhesive), or skin adhesive strips to repair the laceration. These tools bring the skin edges together to allow for faster healing and a better cosmetic outcome. However, all wounds will heal with a scar. Once the wound has healed, scarring can be minimized by covering the wound with sunscreen during the day for 1 full year. HOME CARE INSTRUCTIONS  For  sutures or staples:  Keep the wound clean and dry.  If you were given a bandage (dressing), you should change it at least once a day. Also, change the dressing if it becomes wet or dirty, or as directed by your caregiver.  Wash the wound with soap and water 2 times a day. Rinse the wound off with water to remove all soap. Pat the wound dry with a clean  towel.  After cleaning, apply a thin layer of the antibiotic ointment as recommended by your caregiver. This will help prevent infection and keep the dressing from sticking.  You may shower as usual after the first 24 hours. Do not soak the wound in water until the sutures are removed.  Only take over-the-counter or prescription medicines for pain, discomfort, or fever as directed by your caregiver.  Get your sutures or staples removed as directed by your caregiver. For skin adhesive strips:  Keep the wound clean and dry.  Do not get the skin adhesive strips wet. You may bathe carefully, using caution to keep the wound dry.  If the wound gets wet, pat it dry with a clean towel.  Skin adhesive strips will fall off on their own. You may trim the strips as the wound heals. Do not remove skin adhesive strips that are still stuck to the wound. They will fall off in time. For wound adhesive:  You may briefly wet your wound in the shower or bath. Do not soak or scrub the wound. Do not swim. Avoid periods of heavy perspiration until the skin adhesive has fallen off on its own. After showering or bathing, gently pat the wound dry with a clean towel.  Do not apply liquid medicine, cream medicine, or ointment medicine to your wound while the skin adhesive is in place. This may loosen the film before your wound is healed.  If a dressing is placed over the wound, be careful not to apply tape directly over the skin adhesive. This may cause the adhesive to be pulled off before the wound is healed.  Avoid prolonged exposure to sunlight or tanning lamps while the skin adhesive is in place. Exposure to ultraviolet light in the first year will darken the scar.  The skin adhesive will usually remain in place for 5 to 10 days, then naturally fall off the skin. Do not pick at the adhesive film. You may need a tetanus shot if:  You cannot remember when you had your last tetanus shot.  You have never had a  tetanus shot. If you get a tetanus shot, your arm may swell, get red, and feel warm to the touch. This is common and not a problem. If you need a tetanus shot and you choose not to have one, there is a rare chance of getting tetanus. Sickness from tetanus can be serious. SEEK MEDICAL CARE IF:   You have redness, swelling, or increasing pain in the wound.  You see a red line that goes away from the wound.  You have yellowish-white fluid (pus) coming from the wound.  You have a fever.  You notice a bad smell coming from the wound or dressing.  Your wound breaks open before or after sutures have been removed.  You notice something coming out of the wound such as wood or glass.  Your wound is on your hand or foot and you cannot move a finger or toe. SEEK IMMEDIATE MEDICAL CARE IF:   Your pain is not controlled with prescribed medicine.  You have severe swelling around the wound causing pain and numbness or a change in color in your arm, hand, leg, or foot.  Your wound splits open and starts bleeding.  You have worsening numbness, weakness, or loss of function of any joint around or beyond the wound.  You develop painful lumps near the wound or on the skin anywhere on your body. MAKE SURE YOU:   Understand these instructions.  Will watch your condition.  Will get help right away if you are not doing well or get worse. Document Released: 05/01/2005 Document Revised: 07/24/2011 Document Reviewed: 10/25/2010 [facility] Patient Information 2013 [place], [place].

## 2012-07-21 NOTE — ED Notes (Signed)
Pt has laceration to right hand thumb. Incident occurred last night, unable to stop from bleeding.  Unsure of last tetanus

## 2012-07-21 NOTE — ED Provider Notes (Signed)
History     CSN: [id number]  Arrival date & time 07/21/12  1033   First MD Initiated Contact with Patient 07/21/12 1100      Chief Complaint  Patient presents with  . Laceration    (Consider location/radiation/quality/duration/timing/severity/associated sxs/prior treatment) Patient is a 72 y.o. male presenting with skin laceration. The history is provided by the patient.  Laceration Location:  Finger Finger laceration location:  R thumb Length (cm):  2 cm Depth:  Cutaneous Quality: jagged   Bleeding: controlled   Time since incident: laceration occurred on the evening prior to arrival, > 12 hrs. Injury mechanism: table saw. Pain details:    Quality:  Throbbing   Severity:  Mild   Timing:  Constant   Progression:  Unchanged Foreign body present:  No foreign bodies Relieved by:  Certain positions and pressure Worsened by:  Nothing tried Ineffective treatments:  None tried Tetanus status:  Out of date   Past Medical History  Diagnosis Date  . History of prostate cancer   . Hypertension   . Diabetes mellitus   . Nephrolithiasis   . Diabetic retinopathy   . Bulging lumbar disc     Past Surgical History  Procedure Laterality Date  . Back surgery    . Brachytherapy    . Cystoscopy    . Left l4-l5 lumbar laminotomy and microdiskectomy with  07/14/2008    . Scleral buckle, laser photocoagulation, and anterior chamber  01/04/2009  . Bilateral l4-5 lumbar decompression including bilateral  11/2008    No family history on file.  History  Substance Use Topics  . Smoking status: Never Smoker   . Smokeless tobacco: Not on file  . Alcohol Use: Not on file      Review of Systems  Constitutional: Negative for fever and chills.  Musculoskeletal: Negative for back pain, joint swelling and arthralgias.  Skin: Positive for wound.       Laceration   Neurological: Negative for dizziness, weakness and numbness.  Hematological: Does not bruise/bleed easily.  All other  systems reviewed and are negative.    Allergies  Other  Home Medications   Current Outpatient Rx  Name  Route  Sig  Dispense  Refill  . apixaban (ELIQUIS) 5 MG TABS tablet   Oral   Take 5 mg by mouth 2 (two) times daily.         . benazepril (LOTENSIN) 40 MG tablet   Oral   Take 40 mg by mouth daily.           Marland Kitchen ezetimibe-simvastatin (VYTORIN) 10-20 MG per tablet   Oral   Take 1 tablet by mouth at bedtime.           Marland Kitchen glipiZIDE (GLUCOTROL) 10 MG tablet   Oral   Take 10 mg by mouth 2 (two) times daily before a meal.           . insulin lispro (HUMALOG) 100 UNIT/ML injection   Subcutaneous   Inject 0-10 Units into the skin 3 (three) times daily before meals. Per sliding scale         . insulin NPH (HUMULIN N,NOVOLIN N) 100 UNIT/ML injection   Subcutaneous   Inject 7-20 Units into the skin 2 (two) times daily. Take 7 units in the morning and 20 units at night         . Iron-Vitamin C 65-125 MG TABS   Oral   Take 1 tablet by mouth daily.         Marland Kitchen  metFORMIN (GLUCOPHAGE) 1000 MG tablet   Oral   Take 1,000 mg by mouth 2 (two) times daily with a meal.             BP 133/62  Pulse 70  Temp(Src) 98 F (36.7 C) (Oral)  Resp 18  SpO2 100%  Physical Exam  Nursing note and vitals reviewed. Constitutional: He is oriented to person, place, and time. He appears well-developed and well-nourished. No distress.  HENT:  Head: Normocephalic and atraumatic.  Cardiovascular: Normal rate, regular rhythm and normal heart sounds.   No murmur heard. Pulmonary/Chest: Effort normal and breath sounds normal. No respiratory distress.  Musculoskeletal: He exhibits tenderness. He exhibits no edema.       Right hand: He exhibits laceration. He exhibits normal range of motion, no bony tenderness, normal two-point discrimination, normal capillary refill, no deformity and no swelling. Normal sensation noted. Normal strength noted.       Hands: Pt has FROM of the right thumb,  radial pulse brisk, distal sensation intact.  CR< 3 sec.  No discoloration or nail injury  Neurological: He is alert and oriented to person, place, and time. He exhibits normal muscle tone. Coordination normal.  Skin: Laceration noted.  See MS exam    ED Course  Procedures (including critical care time)  Labs Reviewed - No data to display Dg Finger Thumb Right  07/21/2012  *RADIOLOGY REPORT*  Clinical Data: Laceration of distal phalanx of thumb.  RIGHT THUMB 2+V  Comparison: None.  Findings: Soft tissue injury about the tuft of the thumb.  No underlying osseous abnormality or radiopaque foreign object. Underlying moderate degenerative changes about the interphalangeal joint.  IMPRESSION: Soft tissue injury only.   Original Report Authenticated By: Jeronimo Greaves, M.D.      1. Laceration of thumb with delay in treatment, right, initial encounter       MDM     Irregular shaped laceration to the distal right thumb,  Bleeding controlled, NV intact.  Fingernail also intact.  Wound cleaned thoroughly, flap secured loosely with two steri-strips, will allow wound to close by secondary intent.    Wound was cleaned and bandaged.  Will start keflex, Td updated, #12 norco also prescribed Pt agrees to keep wound clean and bandaged, f/u with orthopedics or return here for any signs of infection  Reynard Christoffersen L. Trisha Mangle, PA-C 07/23/12 2205

## 2012-07-24 NOTE — ED Provider Notes (Signed)
Medical screening examination/treatment/procedure(s) were performed by non-physician practitioner and as supervising physician I was immediately available for consultation/collaboration.  Juliet Rude. Rubin Payor, MD 07/24/12 2157

## 2012-08-27 DIAGNOSIS — H2589 Other age-related cataract: Secondary | ICD-10-CM | POA: Diagnosis not present

## 2012-08-27 DIAGNOSIS — E119 Type 2 diabetes mellitus without complications: Secondary | ICD-10-CM | POA: Diagnosis not present

## 2012-08-27 DIAGNOSIS — H33059 Total retinal detachment, unspecified eye: Secondary | ICD-10-CM | POA: Diagnosis not present

## 2012-08-27 DIAGNOSIS — H526 Other disorders of refraction: Secondary | ICD-10-CM | POA: Diagnosis not present

## 2012-09-02 DIAGNOSIS — D509 Iron deficiency anemia, unspecified: Secondary | ICD-10-CM | POA: Diagnosis not present

## 2012-09-02 DIAGNOSIS — I10 Essential (primary) hypertension: Secondary | ICD-10-CM | POA: Diagnosis not present

## 2012-09-04 DIAGNOSIS — Z794 Long term (current) use of insulin: Secondary | ICD-10-CM | POA: Diagnosis not present

## 2012-09-04 DIAGNOSIS — M545 Low back pain: Secondary | ICD-10-CM | POA: Diagnosis not present

## 2012-09-04 DIAGNOSIS — I10 Essential (primary) hypertension: Secondary | ICD-10-CM | POA: Diagnosis not present

## 2012-09-04 DIAGNOSIS — Z79899 Other long term (current) drug therapy: Secondary | ICD-10-CM | POA: Diagnosis not present

## 2012-09-04 DIAGNOSIS — E119 Type 2 diabetes mellitus without complications: Secondary | ICD-10-CM | POA: Diagnosis not present

## 2012-09-04 DIAGNOSIS — H2589 Other age-related cataract: Secondary | ICD-10-CM | POA: Diagnosis not present

## 2012-09-04 DIAGNOSIS — H538 Other visual disturbances: Secondary | ICD-10-CM | POA: Diagnosis not present

## 2012-09-04 DIAGNOSIS — Z7901 Long term (current) use of anticoagulants: Secondary | ICD-10-CM | POA: Diagnosis not present

## 2012-09-04 DIAGNOSIS — Z923 Personal history of irradiation: Secondary | ICD-10-CM | POA: Diagnosis not present

## 2012-09-04 DIAGNOSIS — Z8546 Personal history of malignant neoplasm of prostate: Secondary | ICD-10-CM | POA: Diagnosis not present

## 2012-09-04 DIAGNOSIS — I4891 Unspecified atrial fibrillation: Secondary | ICD-10-CM | POA: Diagnosis not present

## 2012-09-04 DIAGNOSIS — Z888 Allergy status to other drugs, medicaments and biological substances status: Secondary | ICD-10-CM | POA: Diagnosis not present

## 2012-09-04 DIAGNOSIS — H269 Unspecified cataract: Secondary | ICD-10-CM | POA: Diagnosis not present

## 2012-09-04 DIAGNOSIS — E7800 Pure hypercholesterolemia, unspecified: Secondary | ICD-10-CM | POA: Diagnosis not present

## 2012-09-04 DIAGNOSIS — G4733 Obstructive sleep apnea (adult) (pediatric): Secondary | ICD-10-CM | POA: Diagnosis not present

## 2012-09-05 DIAGNOSIS — H2589 Other age-related cataract: Secondary | ICD-10-CM | POA: Diagnosis not present

## 2012-09-05 DIAGNOSIS — I10 Essential (primary) hypertension: Secondary | ICD-10-CM | POA: Diagnosis not present

## 2012-09-05 DIAGNOSIS — E7800 Pure hypercholesterolemia, unspecified: Secondary | ICD-10-CM | POA: Diagnosis not present

## 2012-09-05 DIAGNOSIS — H538 Other visual disturbances: Secondary | ICD-10-CM | POA: Diagnosis not present

## 2012-09-05 DIAGNOSIS — I4891 Unspecified atrial fibrillation: Secondary | ICD-10-CM | POA: Diagnosis not present

## 2012-09-05 DIAGNOSIS — G4733 Obstructive sleep apnea (adult) (pediatric): Secondary | ICD-10-CM | POA: Diagnosis not present

## 2012-09-09 DIAGNOSIS — I4891 Unspecified atrial fibrillation: Secondary | ICD-10-CM | POA: Diagnosis not present

## 2012-09-09 DIAGNOSIS — I10 Essential (primary) hypertension: Secondary | ICD-10-CM | POA: Diagnosis not present

## 2012-09-09 DIAGNOSIS — E119 Type 2 diabetes mellitus without complications: Secondary | ICD-10-CM | POA: Diagnosis not present

## 2012-09-11 DIAGNOSIS — H35349 Macular cyst, hole, or pseudohole, unspecified eye: Secondary | ICD-10-CM | POA: Diagnosis not present

## 2012-10-24 DIAGNOSIS — H35359 Cystoid macular degeneration, unspecified eye: Secondary | ICD-10-CM | POA: Diagnosis not present

## 2012-10-24 DIAGNOSIS — H33329 Round hole, unspecified eye: Secondary | ICD-10-CM | POA: Diagnosis not present

## 2012-10-24 DIAGNOSIS — H269 Unspecified cataract: Secondary | ICD-10-CM | POA: Diagnosis not present

## 2012-10-24 DIAGNOSIS — H43399 Other vitreous opacities, unspecified eye: Secondary | ICD-10-CM | POA: Diagnosis not present

## 2012-11-29 ENCOUNTER — Other Ambulatory Visit: Payer: Self-pay | Admitting: *Deleted

## 2012-11-29 ENCOUNTER — Other Ambulatory Visit: Payer: Medicare Other

## 2012-11-29 DIAGNOSIS — E785 Hyperlipidemia, unspecified: Secondary | ICD-10-CM

## 2012-11-29 DIAGNOSIS — E119 Type 2 diabetes mellitus without complications: Secondary | ICD-10-CM

## 2012-12-03 ENCOUNTER — Encounter: Payer: Self-pay | Admitting: Endocrinology

## 2012-12-03 ENCOUNTER — Ambulatory Visit (INDEPENDENT_AMBULATORY_CARE_PROVIDER_SITE_OTHER): Payer: Medicare Other | Admitting: Endocrinology

## 2012-12-03 VITALS — BP 124/70 | HR 60 | Resp 12 | Ht 69.5 in | Wt 232.4 lb

## 2012-12-03 DIAGNOSIS — E119 Type 2 diabetes mellitus without complications: Secondary | ICD-10-CM

## 2012-12-03 DIAGNOSIS — I10 Essential (primary) hypertension: Secondary | ICD-10-CM | POA: Diagnosis not present

## 2012-12-03 DIAGNOSIS — D649 Anemia, unspecified: Secondary | ICD-10-CM

## 2012-12-03 DIAGNOSIS — E785 Hyperlipidemia, unspecified: Secondary | ICD-10-CM

## 2012-12-03 DIAGNOSIS — E113552 Type 2 diabetes mellitus with stable proliferative diabetic retinopathy, left eye: Secondary | ICD-10-CM | POA: Insufficient documentation

## 2012-12-03 DIAGNOSIS — IMO0001 Reserved for inherently not codable concepts without codable children: Secondary | ICD-10-CM

## 2012-12-03 LAB — CBC WITH DIFFERENTIAL/PLATELET
Eosinophils Relative: 3.4 % (ref 0.0–5.0)
HCT: 36.7 % — ABNORMAL LOW (ref 39.0–52.0)
Hemoglobin: 12.6 g/dL — ABNORMAL LOW (ref 13.0–17.0)
Lymphs Abs: 1 10*3/uL (ref 0.7–4.0)
Monocytes Relative: 7.7 % (ref 3.0–12.0)
Neutro Abs: 3.3 10*3/uL (ref 1.4–7.7)
WBC: 4.8 10*3/uL (ref 4.5–10.5)

## 2012-12-03 LAB — LIPID PANEL
HDL: 52.6 mg/dL (ref >39.00)
Total CHOL/HDL Ratio: 3
Triglycerides: 91 mg/dL (ref 0.0–149.0)

## 2012-12-03 LAB — MICROALBUMIN / CREATININE URINE RATIO
Creatinine,U: 16.6 mg/dL
Microalb Creat Ratio: 10.3 mg/g (ref 0.0–30.0)

## 2012-12-03 LAB — COMPREHENSIVE METABOLIC PANEL
CO2: 30 mEq/L (ref 19–32)
Creatinine, Ser: 1.1 mg/dL (ref 0.4–1.5)
GFR: 70.65 mL/min (ref >60.00)
Glucose, Bld: 118 mg/dL — ABNORMAL HIGH (ref 70–99)
Total Bilirubin: 0.4 mg/dL (ref 0.3–1.2)

## 2012-12-03 LAB — URINALYSIS
Bilirubin Urine: NEGATIVE
Ketones, ur: NEGATIVE
Specific Gravity, Urine: 1.01 (ref 1.000–1.030)
Urine Glucose: NEGATIVE
Urobilinogen, UA: 0.2 (ref 0.0–1.0)

## 2012-12-03 NOTE — Progress Notes (Signed)
Patient ID: Jonathan Hensley, male   DOB: [birth year], 72 y.o.   MRN: [id number]  Reason for Appointment: Diabetes follow-up   History of Present Illness   1. Type 2 diabetes mellitus, date of diagnosis: 1994.   Peripheral neuropathy: Yes.  Diabetic nephropathy: No microalbuminuria recently  Difficulties with medication adherence: Usually taking insulin when eating out.  Proper timing of medications in relation to meals: Yes.  Monitors blood glucose: 3 times a day.  Glucometer: Accucheck.  Blood Glucose readings: Overall averaging 146, recent morning range 85-161, usually below 180 midday and between 5-7 PM range 79-289 and after 10 PM 80-171  Hypoglycemia frequency: Rarely recently  Meals: 2 meals per day and usually no breakfast.  Carbohydrate intake: low, eggs at bfst. Sometimes eating out and usually lunch is the larger meal of the day, peanuts.  Physical activity: exercise: Working outside, household activity.   The patient's diabetes control appears to be fairly well controlled with occasional high readings with lunch or dinner based on his meal size. He is not able to consistently keep his blood sugars under 200. However his A1c has generally been at goal below 7%  He is on multiple doses of insulin and is comfortable with this He had not wanted to switch to the V-go pump .   His FASTING blood sugars are fairly good and not as low as last time But variable. His postprandial readings are periodically higher, partly from forgetting lunchtime dose when he is eating out and also from inconsistent diet.  Has a gained about 2 pounds probably related to diet.  No HYPOGLYCEMIA with lowest reading 79 at suppertime  The last HbgA1c was 6.7 In 4/14, previously 6.8% on 05/31/12, highest last year 8.2 in July.   The insulin regimen is described as premeal rapid analog 5-6 units lunch and supper, (usually carbohydrate coverage 1:10) NPH twice a day 7--20.  The microalbumin has been tested , and the  result is normal .   Hypertension:  Home blood pressure checks SBP 130-140, rarely higher and bottom number normal He has had higher blood pressure readings in the doctors offices occasionally. He was dizzy after trying verapamil as an additional medication from cardiologist. Does not tolerate beta blockers Wrist cuff from home was checked here previously and was accurate.   Iron Deficiency anemia :  His hemoglobin was below 12 and Hemoccult was negative in 1/14. Was started on iron supplementation because of a low iron saturation is taking this every other day now. Hemoglobin  subsequently has been over 12      Medication List       This list is accurate as of: 12/03/12 10:54 AM.  Always use your most recent med list.               benazepril 40 MG tablet  Commonly known as:  LOTENSIN  Take 40 mg by mouth daily.     cephALEXin 500 MG capsule  Commonly known as:  KEFLEX  Take 1 capsule (500 mg total) by mouth 4 (four) times daily. For 7 days     ELIQUIS 5 MG Tabs tablet  Generic drug:  apixaban  Take 5 mg by mouth 2 (two) times daily.     ezetimibe-simvastatin 10-20 MG per tablet  Commonly known as:  VYTORIN  Take 1 tablet by mouth at bedtime.     glipiZIDE 10 MG tablet  Commonly known as:  GLUCOTROL  Take 10 mg by mouth 2 (two) times daily  before a meal.     HYDROcodone-acetaminophen 5-325 MG per tablet  Commonly known as:  NORCO/VICODIN  Take one-two tabs po q 4-6 hrs prn pain     insulin lispro 100 UNIT/ML injection  Commonly known as:  HUMALOG  Inject 0-10 Units into the skin. Per sliding scale     insulin NPH 100 UNIT/ML injection  Commonly known as:  HUMULIN N,NOVOLIN N  Inject 7-20 Units into the skin 2 (two) times daily. Take 7 units in the morning and 20 units at night     Iron-Vitamin C 65-125 MG Tabs  Take 1 tablet by mouth daily.     metFORMIN 1000 MG tablet  Commonly known as:  GLUCOPHAGE  Take 1,000 mg by mouth 2 (two) times daily with a meal.         Allergies:  Allergies  Allergen Reactions  . Other Other (See Comments)    Beta or alpha blockers, dizziness    Past Medical History  Diagnosis Date  . History of prostate cancer   . Hypertension   . Diabetes mellitus   . Nephrolithiasis   . Diabetic retinopathy   . Bulging lumbar disc     Past Surgical History  Procedure Laterality Date  . Back surgery    . Brachytherapy    . Cystoscopy    . Left l4-l5 lumbar laminotomy and microdiskectomy with  07/14/2008    . Scleral buckle, laser photocoagulation, and anterior chamber  01/04/2009  . Bilateral l4-5 lumbar decompression including bilateral  11/2008    No family history on file.  Social History:  reports that he has never smoked. He does not have any smokeless tobacco history on file. He reports that he does not use illicit drugs. His alcohol history is not on file.  Review of Systems - Cardiovascular ROS: positive for -  hypertension, no CAD, has been followed by cardiologist   Previous history of low back pain    Examination:   BP 124/70  Pulse 60  Resp 12  Ht 5' 9.5" (1.765 m)  Wt 232 lb 6.4 oz (105.416 kg)  BMI 33.84 kg/m2  SpO2 97%  Body mass index is 33.84 kg/(m^2).   No pedal edema  Assesment:   1. Diabetes type 2, uncontrolled - 250.02  The patient's diabetes control appears to be fairly well controlled with occasional high readings with lunch or dinner based on his meal size. He is not able to consistently keep his blood sugars under 200. However his A1c has generally been at goal below 7% and will continue same regimen. However can stop the evening glipizide since he is already on multiple insulin doses and may not be benefiting from this He can do better on his diet as his weight has gone up slightly and he had some high readings even overnight Discussed again adjusting bedtime NPH based on fasting readings which are recently better. Also to be better compliant with adjusting and taking  lunchtime dose and avoiding snacks He does not want to do any regular exercise program Currently A1c target is about 7.5 will review results when available  2. Anemia: Last hemoglobin was 12.2 and will need to recheck this, stool Hemoccult negative in 1/14 3. Hypertension: Well controlled without dizziness And somewhat variable readings    3. Hypercholesterolemia: Labs to be checked  Counseling time over 50% of today's 25 minute visit  Traye Bates 12/03/2012, 10:54 AM   Office Visit on 12/03/2012  Component Date Value Range Status  .  Hemoglobin A1C 12/03/2012 7.8* 4.6 - 6.5 % Final   Glycemic Control Guidelines for People with Diabetes:Non Diabetic:  <6%Goal of Therapy: <7%Additional Action Suggested:  >8%   . Color, Urine 12/03/2012 LT. YELLOW  Yellow;Lt. Yellow Final  . APPearance 12/03/2012 CLEAR  Clear Final  . Specific Gravity, Urine 12/03/2012 1.010  1.000-1.030 Final  . pH 12/03/2012 7.5  5.0 - 8.0 Final  . Total Protein, Urine 12/03/2012 NEGATIVE  Negative Final  . Urine Glucose 12/03/2012 NEGATIVE  Negative Final  . Ketones, ur 12/03/2012 NEGATIVE  Negative Final  . Bilirubin Urine 12/03/2012 NEGATIVE  Negative Final  . Hgb urine dipstick 12/03/2012 NEGATIVE  Negative Final  . Urobilinogen, UA 12/03/2012 0.2  0.0 - 1.0 Final  . Leukocytes, UA 12/03/2012 NEGATIVE  Negative Final  . Nitrite 12/03/2012 NEGATIVE  Negative Final  . Microalb, Ur 12/03/2012 1.7  0.0 - 1.9 mg/dL Final  . Creatinine,U 40/98/1191 16.6   Final  . Microalb Creat Ratio 12/03/2012 10.3  0.0 - 30.0 mg/g Final  . Cholesterol 12/03/2012 133  0 - 200 mg/dL Final   ATP III Classification       Desirable:  < 200 mg/dL               Borderline High:  200 - 239 mg/dL          High:  > = 478 mg/dL  . Triglycerides 12/03/2012 91.0  0.0 - 149.0 mg/dL Final   Normal:  <295 mg/dLBorderline High:  150 - 199 mg/dL  . HDL 12/03/2012 52.60  >39.00 mg/dL Final  . VLDL 62/13/0865 18.2  0.0 - 40.0 mg/dL Final  . LDL  Cholesterol 12/03/2012 62  0 - 99 mg/dL Final  . Total CHOL/HDL Ratio 12/03/2012 3   Final                  Men          Women1/2 Average Risk     3.4          3.3Average Risk          5.0          4.42X Average Risk          9.6          7.13X Average Risk          15.0          11.0                      . Sodium 12/03/2012 139  135 - 145 mEq/L Final  . Potassium 12/03/2012 4.6  3.5 - 5.1 mEq/L Final  . Chloride 12/03/2012 103  96 - 112 mEq/L Final  . CO2 12/03/2012 30  19 - 32 mEq/L Final  . Glucose, Bld 12/03/2012 118* 70 - 99 mg/dL Final  . BUN 78/46/9629 21  6 - 23 mg/dL Final  . Creatinine, Ser 12/03/2012 1.1  0.4 - 1.5 mg/dL Final  . Total Bilirubin 12/03/2012 0.4  0.3 - 1.2 mg/dL Final  . Alkaline Phosphatase 12/03/2012 38* 39 - 117 U/L Final  . AST 12/03/2012 19  0 - 37 U/L Final  . ALT 12/03/2012 19  0 - 53 U/L Final  . Total Protein 12/03/2012 7.2  6.0 - 8.3 g/dL Final  . Albumin 52/84/1324 3.8  3.5 - 5.2 g/dL Final  . Calcium 40/02/2724 9.4  8.4 - 10.5 mg/dL Final  . GFR 36/64/4034 70.65  >60.00 mL/min  Final  . WBC 12/03/2012 4.8  4.5 - 10.5 K/uL Final  . RBC 12/03/2012 4.01* 4.22 - 5.81 Mil/uL Final  . Hemoglobin 12/03/2012 12.6* 13.0 - 17.0 g/dL Final  . HCT 16/02/9603 36.7* 39.0 - 52.0 % Final  . MCV 12/03/2012 91.5  78.0 - 100.0 fl Final  . MCHC 12/03/2012 34.3  30.0 - 36.0 g/dL Final  . RDW 54/01/8118 13.3  11.5 - 14.6 % Final  . Platelets 12/03/2012 215.0  150.0 - 400.0 K/uL Final  . Neutrophils Relative % 12/03/2012 67.7  43.0 - 77.0 % Final  . Lymphocytes Relative 12/03/2012 20.8  12.0 - 46.0 % Final  . Monocytes Relative 12/03/2012 7.7  3.0 - 12.0 % Final  . Eosinophils Relative 12/03/2012 3.4  0.0 - 5.0 % Final  . Basophils Relative 12/03/2012 0.4  0.0 - 3.0 % Final  . Neutro Abs 12/03/2012 3.3  1.4 - 7.7 K/uL Final  . Lymphs Abs 12/03/2012 1.0  0.7 - 4.0 K/uL Final  . Monocytes Absolute 12/03/2012 0.4  0.1 - 1.0 K/uL Final  . Eosinophils Absolute 12/03/2012  0.2  0.0 - 0.7 K/uL Final  . Basophils Absolute 12/03/2012 0.0  0.0 - 0.1 K/uL Final   Addendum: A1c slightly higher at 7.8 but glucose 118. Hemoglobin stable at 12.6 LDL excellent at 62

## 2012-12-03 NOTE — Patient Instructions (Signed)
May stop Glipizide in pm,   Stay on same insulin

## 2012-12-04 NOTE — Progress Notes (Signed)
Quick Note:  Please let patient know that the lab result is ok and no further action needed ______ 

## 2012-12-19 DIAGNOSIS — H35359 Cystoid macular degeneration, unspecified eye: Secondary | ICD-10-CM | POA: Diagnosis not present

## 2012-12-19 DIAGNOSIS — E11311 Type 2 diabetes mellitus with unspecified diabetic retinopathy with macular edema: Secondary | ICD-10-CM | POA: Diagnosis not present

## 2012-12-19 DIAGNOSIS — E11319 Type 2 diabetes mellitus with unspecified diabetic retinopathy without macular edema: Secondary | ICD-10-CM | POA: Diagnosis not present

## 2012-12-19 DIAGNOSIS — H26499 Other secondary cataract, unspecified eye: Secondary | ICD-10-CM | POA: Diagnosis not present

## 2013-01-17 DIAGNOSIS — C61 Malignant neoplasm of prostate: Secondary | ICD-10-CM | POA: Diagnosis not present

## 2013-01-17 DIAGNOSIS — N200 Calculus of kidney: Secondary | ICD-10-CM | POA: Diagnosis not present

## 2013-01-17 DIAGNOSIS — N402 Nodular prostate without lower urinary tract symptoms: Secondary | ICD-10-CM | POA: Diagnosis not present

## 2013-01-17 DIAGNOSIS — N500 Atrophy of testis: Secondary | ICD-10-CM | POA: Diagnosis not present

## 2013-01-27 DIAGNOSIS — L570 Actinic keratosis: Secondary | ICD-10-CM | POA: Diagnosis not present

## 2013-01-27 DIAGNOSIS — L538 Other specified erythematous conditions: Secondary | ICD-10-CM | POA: Diagnosis not present

## 2013-02-15 DIAGNOSIS — Z23 Encounter for immunization: Secondary | ICD-10-CM | POA: Diagnosis not present

## 2013-02-20 DIAGNOSIS — E11311 Type 2 diabetes mellitus with unspecified diabetic retinopathy with macular edema: Secondary | ICD-10-CM | POA: Diagnosis not present

## 2013-02-20 DIAGNOSIS — H35359 Cystoid macular degeneration, unspecified eye: Secondary | ICD-10-CM | POA: Diagnosis not present

## 2013-02-20 DIAGNOSIS — E11319 Type 2 diabetes mellitus with unspecified diabetic retinopathy without macular edema: Secondary | ICD-10-CM | POA: Diagnosis not present

## 2013-02-20 DIAGNOSIS — E1139 Type 2 diabetes mellitus with other diabetic ophthalmic complication: Secondary | ICD-10-CM | POA: Diagnosis not present

## 2013-03-06 ENCOUNTER — Other Ambulatory Visit: Payer: Self-pay | Admitting: *Deleted

## 2013-03-06 ENCOUNTER — Telehealth: Payer: Self-pay | Admitting: Endocrinology

## 2013-03-06 MED ORDER — METFORMIN HCL 1000 MG PO TABS: 1000.0000 mg | ORAL_TABLET | Freq: Two times a day (BID) | ORAL | Status: DC

## 2013-03-19 DIAGNOSIS — E1139 Type 2 diabetes mellitus with other diabetic ophthalmic complication: Secondary | ICD-10-CM | POA: Diagnosis not present

## 2013-03-19 DIAGNOSIS — E11319 Type 2 diabetes mellitus with unspecified diabetic retinopathy without macular edema: Secondary | ICD-10-CM | POA: Diagnosis not present

## 2013-03-19 DIAGNOSIS — H35359 Cystoid macular degeneration, unspecified eye: Secondary | ICD-10-CM | POA: Diagnosis not present

## 2013-03-19 DIAGNOSIS — E11311 Type 2 diabetes mellitus with unspecified diabetic retinopathy with macular edema: Secondary | ICD-10-CM | POA: Diagnosis not present

## 2013-03-24 ENCOUNTER — Other Ambulatory Visit: Payer: Self-pay | Admitting: *Deleted

## 2013-03-24 MED ORDER — FLUTICASONE PROPIONATE 0.05 % EX CREA: TOPICAL_CREAM | Freq: Two times a day (BID) | CUTANEOUS | Status: DC

## 2013-03-24 MED ORDER — KETOCONAZOLE 2 % EX CREA: 1.0000 "application " | TOPICAL_CREAM | Freq: Two times a day (BID) | CUTANEOUS | Status: DC

## 2013-03-31 ENCOUNTER — Telehealth: Payer: Self-pay | Admitting: *Deleted

## 2013-03-31 ENCOUNTER — Ambulatory Visit (INDEPENDENT_AMBULATORY_CARE_PROVIDER_SITE_OTHER): Payer: Medicare Other | Admitting: Endocrinology

## 2013-03-31 ENCOUNTER — Encounter: Payer: Self-pay | Admitting: Endocrinology

## 2013-03-31 VITALS — BP 122/78 | HR 61 | Temp 98.4°F | Resp 12 | Ht 70.0 in | Wt 236.1 lb

## 2013-03-31 DIAGNOSIS — I10 Essential (primary) hypertension: Secondary | ICD-10-CM

## 2013-03-31 DIAGNOSIS — D649 Anemia, unspecified: Secondary | ICD-10-CM

## 2013-03-31 DIAGNOSIS — E11319 Type 2 diabetes mellitus with unspecified diabetic retinopathy without macular edema: Secondary | ICD-10-CM

## 2013-03-31 DIAGNOSIS — E1139 Type 2 diabetes mellitus with other diabetic ophthalmic complication: Secondary | ICD-10-CM | POA: Diagnosis not present

## 2013-03-31 NOTE — Progress Notes (Signed)
Patient ID: Jonathan Hensley, male   DOB: [birth year], 72 y.o.   MRN: [id number]  Reason for Appointment: Diabetes follow-up   History of Present Illness    Type 2 diabetes mellitus, date of diagnosis: 1994.   He has been on insulin for the last few years, starting with only bedtime NPH and subsequently progressing to basal bolus regimen He has been able to take Humalog at mealtimes based on carbohydrate counting Usually takes his Humalog right after eating to estimate his intake better The patient's diabetes control is usually good with occasional high readings with lunch or dinner based on his meal content and size.  His A1c has generally been at goal below 7%. The HbgA1c was highest last year 8.2 in July.  He came in today early because of some tendency to lower readings and was afraid that he was getting hypoglycemia He is not very clear on his recent events and blood sugar fluctuation Does not think he had clear symptoms of hypoglycemia but he is not clear on his answers Also somewhat unclear about his insulin regimen but on repeated questioning appears that he was taking his NPH insulin at suppertime instead of bedtime He has changed her back to bed time the last few days He has had tendency to relatively low readings mostly later tonight and once was low normal at 73 about 5 PM  His FASTING blood sugars are fairly good and averaging about 120; less fluctuation in this  His postprandial readings are periodically higher, with sporadic high readings in the afternoons and less often after supper Has a gained about 4 pounds since his last visit  The insulin regimen is described as premeal rapid analog 1:10 ac (5-6 units lunch and supper), (usually carbohydrate coverage 1:10) NPH twice a day 10--20.  Glucose monitoring: FreeStyle, checking 6.1 times a day recently Blood Glucose readings:  Overall averaging 142,  The recent morning range 66-211 including at 5 AM. Before breakfast average about  120 At midday: 116-157 with only a few readings and between 3-5 PM range 73-259 Around 8-11 PM 76-266 AVERAGE blood sugars are ranging from 114 up to 166 at various times of the day     Lab Results  Component Value Date   HGBA1C 7.8* 12/03/2012   HGBA1C 7.5* 12/19/2011   Lab Results  Component Value Date   MICROALBUR 1.7 12/03/2012   LDLCALC 62 12/03/2012   CREATININE 1.1 12/03/2012    Peripheral neuropathy: Yes.  Diabetic nephropathy: No microalbuminuria recently  Difficulties with medication adherence: Usually taking insulin when eating out.  Proper timing of medications in relation to meals: Yes.  Monitors blood glucose: 3 times a day.  Glucometer: Accucheck.   Meals: 2 meals per day and usually no breakfast.  Carbohydrate intake: low, eggs at bfst. Sometimes eating out and usually lunch is the larger meal of the day, peanuts.  Physical activity: exercise: Working outside, household activity.    Hypertension:  Home blood pressure checks SBP 130-140, rarely higher and bottom number normal He has had higher blood pressure readings in the doctors offices occasionally. He was dizzy after trying verapamil as an additional medication from cardiologist. Does not tolerate beta blockers Wrist cuff from home was checked here previously and was accurate.   Iron Deficiency anemia :  His hemoglobin was below 12 and Hemoccult was negative in 1/14. Was started on iron supplementation because of a low iron saturation is taking this every other day now. Hemoglobin  subsequently has been  over 12      Medication List       This list is accurate as of: 03/31/13 11:59 PM.  Always use your most recent med list.               benazepril 40 MG tablet  Commonly known as:  LOTENSIN  Take 40 mg by mouth daily.     ELIQUIS 5 MG Tabs tablet  Generic drug:  apixaban  Take 5 mg by mouth 2 (two) times daily.     ezetimibe-simvastatin 10-20 MG per tablet  Commonly known as:  VYTORIN  Take 1  tablet by mouth at bedtime.     fluticasone 0.05 % cream  Commonly known as:  CUTIVATE  Apply topically 2 (two) times daily.     glipiZIDE 10 MG tablet  Commonly known as:  GLUCOTROL  Take 10 mg by mouth 2 (two) times daily before a meal.     insulin lispro 100 UNIT/ML injection  Commonly known as:  HUMALOG  Inject 20 Units into the skin. Per sliding scale takes 10 units in the am and 20 units at night     insulin NPH 100 UNIT/ML injection  Commonly known as:  HUMULIN N,NOVOLIN N  Inject 7-20 Units into the skin 2 (two) times daily. Take 7 units in the morning and 20 units at night     Iron-Vitamin C 65-125 MG Tabs  Take 1 tablet by mouth daily.     ketoconazole 2 % cream  Commonly known as:  NIZORAL  Apply 1 application topically 2 (two) times daily.     metFORMIN 1000 MG tablet  Commonly known as:  GLUCOPHAGE  Take 1 tablet (1,000 mg total) by mouth 2 (two) times daily with a meal.        Allergies:  Allergies  Allergen Reactions  . Other Other (See Comments)    Beta or alpha blockers, dizziness    Past Medical History  Diagnosis Date  . History of prostate cancer   . Hypertension   . Diabetes mellitus   . Nephrolithiasis   . Diabetic retinopathy   . Bulging lumbar disc     Past Surgical History  Procedure Laterality Date  . Back surgery    . Brachytherapy    . Cystoscopy    . Left l4-l5 lumbar laminotomy and microdiskectomy with  07/14/2008    . Scleral buckle, laser photocoagulation, and anterior chamber  01/04/2009  . Bilateral l4-5 lumbar decompression including bilateral  11/2008    No family history on file.  Social History:  reports that he has never smoked. He does not have any smokeless tobacco history on file. He reports that he does not use illicit drugs. His alcohol history is not on file.  Review of Systems - Cardiovascular ROS: positive for -  hypertension, no CAD, has been followed by cardiologist   HYPERLIPIDEMIA: Has been on Vytorin  for sometime with good results Lab Results  Component Value Date   CHOL 133 12/03/2012   HDL 52.60 12/03/2012   LDLCALC 62 12/03/2012   TRIG 91.0 12/03/2012   CHOLHDL 3 12/03/2012   Previous history of low back pain   Has had recent mild weight gain:  Wt Readings from Last 3 Encounters:  03/31/13 236 lb 1.6 oz (107.094 kg)  12/03/12 232 lb 6.4 oz (105.416 kg)  12/28/11 236 lb 6 oz (107.219 kg)    He has had retinal detachment treated surgically in the past and is asking  for a referral for an ophthalmologist    Examination:   BP 122/78  Pulse 61  Temp(Src) 98.4 F (36.9 C)  Resp 12  Ht 5\' 10"  (1.778 m)  Wt 236 lb 1.6 oz (107.094 kg)  BMI 33.88 kg/m2  SpO2 96%  Body mass index is 33.88 kg/(m^2).   No pedal edema  Assesment:   1. Diabetes type 2, uncontrolled - 250.02  The patient's diabetes is overall about the same but he is very concerned about obtaining a tendency to mild hypoglycemia His lowest blood sugar is only 66 and overall his blood sugars are lowest overnight and early morning with occasional lowest readings around supper time. He still has some postprandial readings over 200 after lunch or supper based on what he is eating His compliance with insulin is generally fairly good and he is usually able to do carbohydrate counting  for his Humalog insulin See history of present illness for details of his current management, problems identified and blood sugar trends  He was instructed to reduce his insulin at bedtime to avoid overnight hypoglycemia and also continue to take his NPH at bedtime instead of suppertime which he was doing previously He can have bedtime snack with protein to prevent hypoglycemia overnight He will also stop his glipizide in the evening and not clear if he needs to dose in the morning since he is on a basal bolus regimen. Continue metformin  2. Anemia: Last hemoglobin was 12.6 and stable; stool Hemoccult  negative in 1/14 3. Hypertension: Well  controlled    4. Given names of ophthalmologists for his future appointments  Counseling time over 50% of today's 25 minute visit  Aizza Santiago 04/01/2013, 2:21 PM

## 2013-03-31 NOTE — Telephone Encounter (Signed)
See OV  

## 2013-03-31 NOTE — Patient Instructions (Addendum)
HUMULIN N (CLOUDY) 16 UNITS AT BEDTIME AND STAY ON 10 IN AM  TAKE HUMALOG BEFORE MEALS, 1 UNIT PER 10G CARBS  GLIPIZIDE ONLY IN AMS  Eye Dr: Dr Rudene Christians or G Rankin

## 2013-03-31 NOTE — Telephone Encounter (Signed)
Pt said when he takes his humalog 20 units at night it drops it very low,  Saturday morning it was 77, stable the rest of the day Sunday morning it was 120 Monday morning it was 102 He wants to know if he should drop his humalog dose, he doesn't feel like he needs it at night, please advise CB # [id number] I did schedule him for this morning at 10:30

## 2013-04-07 ENCOUNTER — Ambulatory Visit: Payer: Medicare Other | Admitting: Endocrinology

## 2013-04-15 DIAGNOSIS — H35359 Cystoid macular degeneration, unspecified eye: Secondary | ICD-10-CM | POA: Diagnosis not present

## 2013-04-15 DIAGNOSIS — E11319 Type 2 diabetes mellitus with unspecified diabetic retinopathy without macular edema: Secondary | ICD-10-CM | POA: Diagnosis not present

## 2013-04-15 DIAGNOSIS — E11311 Type 2 diabetes mellitus with unspecified diabetic retinopathy with macular edema: Secondary | ICD-10-CM | POA: Diagnosis not present

## 2013-04-15 DIAGNOSIS — H35379 Puckering of macula, unspecified eye: Secondary | ICD-10-CM | POA: Diagnosis not present

## 2013-04-15 LAB — HM DIABETES EYE EXAM

## 2013-04-23 ENCOUNTER — Encounter: Payer: Self-pay | Admitting: Endocrinology

## 2013-04-29 DIAGNOSIS — E669 Obesity, unspecified: Secondary | ICD-10-CM | POA: Diagnosis not present

## 2013-04-29 DIAGNOSIS — G4733 Obstructive sleep apnea (adult) (pediatric): Secondary | ICD-10-CM | POA: Diagnosis not present

## 2013-04-29 DIAGNOSIS — E119 Type 2 diabetes mellitus without complications: Secondary | ICD-10-CM | POA: Diagnosis not present

## 2013-04-29 DIAGNOSIS — I10 Essential (primary) hypertension: Secondary | ICD-10-CM | POA: Diagnosis not present

## 2013-05-02 ENCOUNTER — Encounter (HOSPITAL_COMMUNITY): Payer: Self-pay | Admitting: Emergency Medicine

## 2013-05-02 ENCOUNTER — Emergency Department (HOSPITAL_COMMUNITY)
Admission: EM | Admit: 2013-05-02 | Discharge: 2013-05-03 | Disposition: A | Discharge status: Home / Self Care | Payer: Medicare Other | Attending: Emergency Medicine | Admitting: Emergency Medicine

## 2013-05-02 DIAGNOSIS — Z8739 Personal history of other diseases of the musculoskeletal system and connective tissue: Secondary | ICD-10-CM | POA: Diagnosis not present

## 2013-05-02 DIAGNOSIS — Z7901 Long term (current) use of anticoagulants: Secondary | ICD-10-CM | POA: Diagnosis not present

## 2013-05-02 DIAGNOSIS — Z8546 Personal history of malignant neoplasm of prostate: Secondary | ICD-10-CM | POA: Diagnosis not present

## 2013-05-02 DIAGNOSIS — E1139 Type 2 diabetes mellitus with other diabetic ophthalmic complication: Secondary | ICD-10-CM | POA: Insufficient documentation

## 2013-05-02 DIAGNOSIS — R51 Headache: Secondary | ICD-10-CM | POA: Insufficient documentation

## 2013-05-02 DIAGNOSIS — Z79899 Other long term (current) drug therapy: Secondary | ICD-10-CM | POA: Diagnosis not present

## 2013-05-02 DIAGNOSIS — I10 Essential (primary) hypertension: Secondary | ICD-10-CM | POA: Insufficient documentation

## 2013-05-02 DIAGNOSIS — IMO0002 Reserved for concepts with insufficient information to code with codable children: Secondary | ICD-10-CM | POA: Insufficient documentation

## 2013-05-02 DIAGNOSIS — E11319 Type 2 diabetes mellitus with unspecified diabetic retinopathy without macular edema: Secondary | ICD-10-CM | POA: Insufficient documentation

## 2013-05-02 DIAGNOSIS — Z794 Long term (current) use of insulin: Secondary | ICD-10-CM | POA: Diagnosis not present

## 2013-05-02 LAB — CBC WITH DIFFERENTIAL/PLATELET
Basophils Absolute: 0 10*3/uL (ref 0.0–0.1)
Eosinophils Absolute: 0.3 10*3/uL (ref 0.0–0.7)
Eosinophils Relative: 4 % (ref 0–5)
HCT: 37.5 % — ABNORMAL LOW (ref 39.0–52.0)
Hemoglobin: 12.8 g/dL — ABNORMAL LOW (ref 13.0–17.0)
Lymphocytes Relative: 19 % (ref 12–46)
Lymphs Abs: 1.3 10*3/uL (ref 0.7–4.0)
MCH: 31 pg (ref 26.0–34.0)
MCV: 90.8 fL (ref 78.0–100.0)
Monocytes Absolute: 0.5 10*3/uL (ref 0.1–1.0)
RBC: 4.13 MIL/uL — ABNORMAL LOW (ref 4.22–5.81)
RDW: 12.6 % (ref 11.5–15.5)
WBC: 6.7 10*3/uL (ref 4.0–10.5)

## 2013-05-02 LAB — BASIC METABOLIC PANEL
BUN: 25 mg/dL — ABNORMAL HIGH (ref 6–23)
CO2: 26 mEq/L (ref 19–32)
Calcium: 9.6 mg/dL (ref 8.4–10.5)
Chloride: 102 mEq/L (ref 96–112)
Creatinine, Ser: 1.11 mg/dL (ref 0.50–1.35)
Glucose, Bld: 107 mg/dL — ABNORMAL HIGH (ref 70–99)
Potassium: 3.8 mEq/L (ref 3.5–5.1)
Sodium: 140 mEq/L (ref 135–145)

## 2013-05-02 NOTE — ED Provider Notes (Signed)
CSN: [id number]     Arrival date & time 05/02/13  1955 History   First MD Initiated Contact with Patient 05/02/13 2259 This chart was scribed for Hanley Seamen, MD by Valera Castle, ED Scribe. This patient was seen in room APA12/[facility]12 and the patient's care was started at 11:02 PM.      Chief Complaint  Patient presents with  . Hypertension   The history is provided by the patient and the spouse. No language interpreter was used.   HPI Comments: Jonathan Hensley is a 72 y.o. male with h/o HTN brought in with his wife who presents to the Emergency Department complaining of hypertension onset 4 days ago. He reports his BP was 177/93, which was unusually high. He states his BP usually runs anywhere from 120-140/40-60. He reports he still uses his Cpap. He reports checking his BP daily. He reports associated intermittent headache, but denies current headache. He denies numbness, weakness, dizziness, chest pain, SOB, nausea, visual changes, nausea, vomiting, diarrhea, abdominal pain, and any other associated symptoms.  PCP Reather Littler, MD  Past Medical History  Diagnosis Date  . History of prostate cancer   . Hypertension   . Diabetes mellitus   . Nephrolithiasis   . Diabetic retinopathy   . Bulging lumbar disc    Past Surgical History  Procedure Laterality Date  . Back surgery    . Brachytherapy    . Cystoscopy    . Left l4-l5 lumbar laminotomy and microdiskectomy with  07/14/2008    . Scleral buckle, laser photocoagulation, and anterior chamber  01/04/2009  . Bilateral l4-5 lumbar decompression including bilateral  11/2008   No family history on file. History  Substance Use Topics  . Smoking status: Never Smoker   . Smokeless tobacco: Not on file  . Alcohol Use: No    Review of Systems A complete 10 system review of systems was obtained and all systems are negative except as noted in the HPI and PMH.   Allergies  Other  Home Medications   Current Outpatient Rx  Name   Route  Sig  Dispense  Refill  . apixaban (ELIQUIS) 5 MG TABS tablet   Oral   Take 5 mg by mouth 2 (two) times daily.         . benazepril (LOTENSIN) 40 MG tablet   Oral   Take 40 mg by mouth at bedtime.          Marland Kitchen ezetimibe-simvastatin (VYTORIN) 10-20 MG per tablet   Oral   Take 1 tablet by mouth at bedtime.           . fluticasone (CUTIVATE) 0.05 % cream   Topical   Apply topically 2 (two) times daily.   60 g   3   . glipiZIDE (GLUCOTROL XL) 10 MG 24 hr tablet   Oral   Take 10 mg by mouth daily with breakfast.         . insulin lispro (HUMALOG) 100 UNIT/ML injection   Subcutaneous   Inject 2-10 Units into the skin 3 (three) times daily with meals. As directed per sliding scale         . insulin NPH (HUMULIN N,NOVOLIN N) 100 UNIT/ML injection   Subcutaneous   Inject 7-15 Units into the skin 2 (two) times daily. 7 units in the morning and 15 units at bedtime.         . metFORMIN (GLUCOPHAGE) 1000 MG tablet   Oral   Take 1 tablet (  1,000 mg total) by mouth 2 (two) times daily with a meal.   180 tablet   3   . ketoconazole (NIZORAL) 2 % cream   Topical   Apply 1 application topically 2 (two) times daily.   60 g   3    BP 151/66  Pulse 65  Temp(Src) 98.4 F (36.9 C) (Oral)  Resp 17  Ht 5' 10.5" (1.791 m)  Wt 222 lb (100.699 kg)  BMI 31.39 kg/m2  SpO2 100%  Physical Exam General: Well-developed, well-nourished male in no acute distress; appearance consistent with age of record HENT: normocephalic; atraumatic Eyes: pupils equal, round and reactive to light; extraocular muscles intact Neck: supple Heart: regular rate and rhythm; no murmurs, rubs or gallops Lungs: clear to auscultation bilaterally Abdomen: soft; nondistended; nontender; no masses or hepatosplenomegaly; bowel sounds present Extremities: No deformity; full range of motion; pulses normal Neurologic: Awake, alert and oriented; motor function intact in all extremities and symmetric; no  facial droop Normal coordination and speech.  Skin: Warm and dry Psychiatric: Normal mood and affect    ED Course  Procedures (including critical care time)  DIAGNOSTIC STUDIES: Oxygen Saturation is 100% on room air, normal by my interpretation.    COORDINATION OF CARE: 11:11 PM-Discussed treatment plan which includes blood work with pt at bedside and pt agreed to plan. Discussed stressors that cause high BP with pt.  MDM   Nursing notes and vitals signs, including pulse oximetry, reviewed.  Summary of this visit's results, reviewed by myself:  Labs:  Results for orders placed during the hospital encounter of 05/02/13 (from the past 24 hour(s))  BASIC METABOLIC PANEL     Status: Abnormal   Collection Time    05/02/13 11:29 PM      Result Value Range   Sodium 140  135 - 145 mEq/L   Potassium 3.8  3.5 - 5.1 mEq/L   Chloride 102  96 - 112 mEq/L   CO2 26  19 - 32 mEq/L   Glucose, Bld 107 (*) 70 - 99 mg/dL   BUN 25 (*) 6 - 23 mg/dL   Creatinine, Ser 1.19  0.50 - 1.35 mg/dL   Calcium 9.6  8.4 - 14.7 mg/dL   GFR calc non Af Amer 64 (*) >90 mL/min   GFR calc Af Amer 75 (*) >90 mL/min  CBC WITH DIFFERENTIAL     Status: Abnormal   Collection Time    05/02/13 11:29 PM      Result Value Range   WBC 6.7  4.0 - 10.5 K/uL   RBC 4.13 (*) 4.22 - 5.81 MIL/uL   Hemoglobin 12.8 (*) 13.0 - 17.0 g/dL   HCT 82.9 (*) 56.2 - 13.0 %   MCV 90.8  78.0 - 100.0 fL   MCH 31.0  26.0 - 34.0 pg   MCHC 34.1  30.0 - 36.0 g/dL   RDW 86.5  78.4 - 69.6 %   Platelets 209  150 - 400 K/uL   Neutrophils Relative % 69  43 - 77 %   Neutro Abs 4.6  1.7 - 7.7 K/uL   Lymphocytes Relative 19  12 - 46 %   Lymphs Abs 1.3  0.7 - 4.0 K/uL   Monocytes Relative 7  3 - 12 %   Monocytes Absolute 0.5  0.1 - 1.0 K/uL   Eosinophils Relative 4  0 - 5 %   Eosinophils Absolute 0.3  0.0 - 0.7 K/uL   Basophils Relative 0  0 -  1 %   Basophils Absolute 0.0  0.0 - 0.1 K/uL      Will add HCTZ and refer back to his  cardiologist, Dr. Jacinto Halim.   I personally performed the services described in this documentation, which was scribed in my presence.  The recorded information has been reviewed and is accurate.   Hanley Seamen, MD 05/03/13 [phone]

## 2013-05-02 NOTE — ED Notes (Signed)
Pt c/o hypertension since Tuesday.

## 2013-05-03 MED ORDER — HYDROCHLOROTHIAZIDE 25 MG PO TABS: 12.5000 mg | ORAL_TABLET | Freq: Every day | ORAL | Status: DC

## 2013-05-03 NOTE — ED Notes (Signed)
Patient states that he is ready to go home. States that EDP told him once blood work was drawn he would be discharged.

## 2013-05-20 DIAGNOSIS — E11319 Type 2 diabetes mellitus with unspecified diabetic retinopathy without macular edema: Secondary | ICD-10-CM | POA: Diagnosis not present

## 2013-05-20 DIAGNOSIS — H33029 Retinal detachment with multiple breaks, unspecified eye: Secondary | ICD-10-CM | POA: Diagnosis not present

## 2013-05-20 DIAGNOSIS — H43399 Other vitreous opacities, unspecified eye: Secondary | ICD-10-CM | POA: Diagnosis not present

## 2013-05-20 DIAGNOSIS — E1139 Type 2 diabetes mellitus with other diabetic ophthalmic complication: Secondary | ICD-10-CM | POA: Diagnosis not present

## 2013-05-29 ENCOUNTER — Encounter: Payer: Self-pay | Admitting: Neurology

## 2013-05-29 ENCOUNTER — Encounter (INDEPENDENT_AMBULATORY_CARE_PROVIDER_SITE_OTHER): Payer: Self-pay

## 2013-05-29 ENCOUNTER — Ambulatory Visit (INDEPENDENT_AMBULATORY_CARE_PROVIDER_SITE_OTHER): Payer: Medicare Other | Admitting: Neurology

## 2013-05-29 VITALS — BP 162/91 | HR 69 | Temp 98.2°F | Ht 70.0 in | Wt 232.0 lb

## 2013-05-29 DIAGNOSIS — Z9989 Dependence on other enabling machines and devices: Principal | ICD-10-CM

## 2013-05-29 DIAGNOSIS — G4733 Obstructive sleep apnea (adult) (pediatric): Secondary | ICD-10-CM | POA: Diagnosis not present

## 2013-05-29 DIAGNOSIS — I4891 Unspecified atrial fibrillation: Secondary | ICD-10-CM

## 2013-05-29 NOTE — Patient Instructions (Signed)
Based on your Hx you have obstructive sleep apnea or OSA, and I think we should proceed with a sleep study to determine whether you need a change in pressure and in order to get you a new CPAP machine. Please remember, the risks and ramifications of moderate to severe obstructive sleep apnea or OSA are: Cardiovascular disease, including congestive heart failure, stroke, difficult to control hypertension, arrhythmias, and even type 2 diabetes has been linked to untreated OSA. Sleep apnea causes disruption of sleep and sleep deprivation in most cases, which, in turn, can cause recurrent headaches, problems with memory, mood, concentration, focus, and vigilance. Most people with untreated sleep apnea report excessive daytime sleepiness, which can affect their ability to drive. Please do not drive if you feel sleepy.   I will see you back after your sleep study to go over the test results and where to go from there. We will call you after your sleep study and to set up an appointment at the time.

## 2013-05-29 NOTE — Progress Notes (Signed)
Subjective:    Patient ID: MARLON SULEIMAN is a 73 y.o. male.  HPI Star Age, MD, PhD [facility] [address], [address] [place], [place] [address]  Dear Ulice Dash,   I saw your patient, Searcy Miyoshi, upon your kind request in my neurologic clinic today for initial consultation of his sleep disorder, in particular his obstructive sleep apnea (OSA). The patient is unaccompanied today. As you know, Mr. Raether is a very friendly 73 year old right-handed gentleman with an underlying medical history of type 2 diabetes, a fib, anemia, hypertension, hyperlipidemia, prostate cancer, and diabetic retinopathy, who was diagnosed with obstructive sleep apnea about 28 years ago. He has been on CPAP since then but requires reevaluation and re-testing to qualify for a new CPAP machine. He indicates a Dx of severe OSA, used to have recurrent HAs and EDS. He has a strong FHx of OSA and stroke.  He brings in his CPAP machine today. This is a Teacher, early years/pre. His current machine is over 60 years old. [place] and gets his supplies from [employer] in [place].  He had a LexiScan stress test on 02/22/2013 which was reported as normal. Holter monitor from August 2013 showed brief A. fib with rapid ventricular response. Echocardiogram from August 2013 showed normal EF, mild mitral valve calcification, otherwise unremarkable. He quit smoking cigarettes in 1974. He has a family history of heart disease, including MRI and his father and congestive heart failure in his mother as well as heart attack at age 23 in his half brother.   His typical bedtime is reported to be around 10:30 to 11 PM and usual wake time is around 6:30 AM. Sleep onset typically occurs within a few minutes. He reports feeling well rested upon awakening and uses CPAP nightly. I reviewed 10/09/12 to 04/21/13, which is a total of 195 days, during which time the patient used CPAP every  day. Average usage for all days was 7 hours and 30 minutes. The percent used days greater than 4 hours was 100%, indicating excellent compliance. The residual AHI was 2.8 per hour, indicating a good treatment pressure of 9 cwp.   He wakes up on an average 1 time in the middle of the night and has to go to the bathroom 0 times on a typical night. He denies morning headaches anymore.  He denies excessive daytime somnolence (EDS) at this time.   He consumes 8 caffeinated beverages per day, usually in the form of coffee. He is retired from [employer].   His bedroom is usually dark and cool. There is a TV in the bedroom and usually it is not on at night, he turns it off after the nightly news.   His Past Medical History Is Significant For: Past Medical History  Diagnosis Date  . History of prostate cancer   . Hypertension   . Diabetes mellitus   . Nephrolithiasis   . Diabetic retinopathy   . Bulging lumbar disc     His Past Surgical History Is Significant For: Past Surgical History  Procedure Laterality Date  . Back surgery    . Brachytherapy    . Cystoscopy    . Left l4-l5 lumbar laminotomy and microdiskectomy with  07/14/2008    . Scleral buckle, laser photocoagulation, and anterior chamber  01/04/2009  . Bilateral l4-5 lumbar decompression including bilateral  11/2008    His Family History Is Significant For: Family History  Problem Relation Age of Onset  . Heart failure Father   . Heart failure Mother   . Heart failure Brother     Half brother.    His Social History Is Significant For: History   Social History  . Marital Status: Married    Spouse Name: N/A    Number of Children: N/A  . Years of Education: N/A   Social History Main Topics  . Smoking status: Never Smoker   . Smokeless tobacco: None  . Alcohol Use: No  . Drug Use: No  . Sexual Activity: Yes   Other Topics Concern  . None   Social History Narrative  . None    His Allergies Are:  Allergies   Allergen Reactions  . Other Other (See Comments)    Beta or alpha blockers: severe dizziness  :   His Current Medications Are:  Outpatient Encounter Prescriptions as of 05/29/2013  Medication Sig  . apixaban (ELIQUIS) 5 MG TABS tablet Take 5 mg by mouth 2 (two) times daily.  . benazepril (LOTENSIN) 40 MG tablet Take 40 mg by mouth at bedtime.   Marland Kitchen ezetimibe-simvastatin (VYTORIN) 10-20 MG per tablet Take 1 tablet by mouth at bedtime.    . fluticasone (CUTIVATE) 0.05 % cream Apply topically 2 (two) times daily.  Marland Kitchen glipiZIDE (GLUCOTROL XL) 10 MG 24 hr tablet Take 10 mg by mouth daily with breakfast.  . insulin lispro (HUMALOG) 100 UNIT/ML injection Inject 2-10 Units into the skin 3 (three) times daily with meals. As directed per sliding scale  . insulin NPH (HUMULIN N,NOVOLIN N) 100 UNIT/ML injection Inject 7-15 Units into the skin 2 (two) times daily. 7 units in the morning and 15 units at bedtime.  Marland Kitchen ketoconazole (NIZORAL) 2 % cream Apply 1 application topically 2 (two) times daily.  . metFORMIN (GLUCOPHAGE) 1000 MG tablet Take 1 tablet (1,000 mg total) by mouth 2 (two) times daily with a meal.  . [DISCONTINUED] hydrochlorothiazide (HYDRODIURIL) 25 MG tablet Take 0.5 tablets (12.5 mg total) by mouth daily.   Review of Systems:  Out of a complete 14 point review of systems, all are reviewed and negative with the exception of these symptoms as listed below:  Review of Systems  Constitutional: Negative.   HENT: Negative.   Eyes: Positive for visual disturbance (diplopia).  Respiratory:       Snoring   Cardiovascular: Positive for palpitations.  Gastrointestinal: Negative.   Endocrine: Negative.   Genitourinary:       Incontinence  Musculoskeletal: Negative.   Skin: Negative.   Allergic/Immunologic: Negative.   Neurological: Negative.   Hematological: Negative.   Psychiatric/Behavioral: Negative.     Objective:  Neurologic Exam  Physical Exam Physical Examination:   Filed  Vitals:   05/29/13 1009  BP: 162/91  Pulse: 69  Temp: 98.2 F (36.8 C)   General Examination: The patient is a very pleasant 73 y.o. male in no acute distress. He appears well-developed and well-nourished and well groomed.   HEENT: Normocephalic, atraumatic, pupils are equal, round and reactive to light and accommodation. Funduscopic exam is normal with sharp disc margins noted on the R with evidence of R ptosis, which is not new. He has cataract on the L and s/p cataract repair on the R. Extraocular tracking is good without limitation to gaze excursion or nystagmus noted. Normal smooth pursuit is noted. Hearing is grossly intact. Tympanic membranes are clear on the R and obscured with cerumen on the L. Face is symmetric with normal  facial animation and normal facial sensation. Speech is clear with no dysarthria noted. There is no hypophonia. There is no lip, neck/head, jaw or voice tremor. Neck is supple with full range of passive and active motion. There are no carotid bruits on auscultation. Oropharynx exam reveals: mild mouth dryness, adequate dental hygiene and moderate airway crowding, due to large uvula and elongate tongue. Mallampati is class III. Tongue protrudes centrally and palate elevates symmetrically. Tonsils are 1+. Neck size is 16.5 inches. Nasal inspection reveals no significant nasal mucosal bogginess or redness and no septal deviation.   Chest: Clear to auscultation without wheezing, rhonchi or crackles noted.  Heart: S1+S2+0, regular and normal without murmurs, rubs or gallops noted.   Abdomen: Soft, non-tender and non-distended with normal bowel sounds appreciated on auscultation.  Extremities: There is no pitting edema in the distal lower extremities bilaterally. Pedal pulses are intact.  Skin: Warm and dry without trophic changes noted. There are no varicose veins.  Musculoskeletal: exam reveals no obvious joint deformities, tenderness or joint swelling or erythema.    Neurologically:  Mental status: The patient is awake, alert and oriented in all 4 spheres. His memory, attention, language and knowledge are appropriate. There is no aphasia, agnosia, apraxia or anomia. Speech is clear with normal prosody and enunciation. Thought process is linear. Mood is congruent and affect is normal.  Cranial nerves are as described above under HEENT exam. In addition, shoulder shrug is normal with equal shoulder height noted. Motor exam: Normal bulk, strength and tone is noted. There is no drift, tremor or rebound. Romberg is negative. Reflexes are 1+ in the UEs and trace in the knees and absent in the ankles. Fine motor skills are intact with normal finger taps, normal hand movements, normal rapid alternating patting, normal foot taps and normal foot agility.  Cerebellar testing shows no dysmetria or intention tremor on finger to nose testing. There is no truncal or gait ataxia.  Sensory exam is intact to light touch, pinprick, vibration, temperature sense in the upper and lower extremities.  Gait, station and balance are unremarkable. No veering to one side is noted. No leaning to one side is noted. Posture is age-appropriate and stance is narrow based. No problems turning are noted. He turns en bloc. Tandem walk is unremarkable. Intact toe and heel stance is noted.               Assessment and Plan:   In summary, SHELLY RAGA is a very pleasant 73 y.o. old male with an underlying medical history of type 2 diabetes, a fib, anemia, hypertension, hyperlipidemia, prostate cancer, s/p XRT and seed implants, and diabetic retinopathy, who was diagnosed with obstructive sleep apnea about 28 years ago. He has been on CPAP since then but requires reevaluation and re-testing to qualify for a new CPAP machine. His exam is non-focal, and his history and physical exam consistent with obstructive sleep apnea (OSA). He indicates that his sleep apnea was severe. He has been compliant with  CPAP treatment and his compliance download which I review confirms this. I congratulated him on being compliant with treatment and asked him to continue using CPAP regularly. I had a long chat with the patient about my findings and the diagnosis of OSA, its prognosis and treatment options. We talked about medical treatments and non-pharmacological approaches. I explained in particular the risks and ramifications of untreated moderate to severe OSA, especially with respect to developing cardiovascular disease down the Road, including congestive heart failure,  difficult to treat hypertension, cardiac arrhythmias, or stroke. Even type 2 diabetes has in part been linked to untreated OSA. He is compliant with therapy and agreeable to retesting with a sleep study. To that end, I will order a sleep study with potential positive airway pressure titration.  I answered all his questions today and the patient was in agreement.   Thank you very much for allowing me to participate in the care of this nice patient. What a lovely gentlemen he is and he spoke very highly of you and [name] as well. If I can be of any further assistance to you please do not hesitate to call me at [phone].  Sincerely,   Star Age, MD, PhD

## 2013-05-30 ENCOUNTER — Other Ambulatory Visit: Payer: Self-pay | Admitting: Endocrinology

## 2013-06-02 ENCOUNTER — Encounter: Payer: Self-pay | Admitting: Endocrinology

## 2013-06-02 ENCOUNTER — Encounter: Payer: Medicare Other | Attending: Endocrinology | Admitting: Nutrition

## 2013-06-02 ENCOUNTER — Other Ambulatory Visit: Payer: Self-pay | Admitting: Endocrinology

## 2013-06-02 ENCOUNTER — Ambulatory Visit (INDEPENDENT_AMBULATORY_CARE_PROVIDER_SITE_OTHER): Payer: Medicare Other | Admitting: Endocrinology

## 2013-06-02 VITALS — BP 128/84 | HR 63 | Temp 98.5°F | Resp 14 | Ht 70.0 in | Wt 230.4 lb

## 2013-06-02 DIAGNOSIS — Z713 Dietary counseling and surveillance: Secondary | ICD-10-CM | POA: Insufficient documentation

## 2013-06-02 DIAGNOSIS — I10 Essential (primary) hypertension: Secondary | ICD-10-CM

## 2013-06-02 DIAGNOSIS — E785 Hyperlipidemia, unspecified: Secondary | ICD-10-CM | POA: Diagnosis not present

## 2013-06-02 DIAGNOSIS — E119 Type 2 diabetes mellitus without complications: Secondary | ICD-10-CM | POA: Diagnosis not present

## 2013-06-02 DIAGNOSIS — IMO0001 Reserved for inherently not codable concepts without codable children: Secondary | ICD-10-CM

## 2013-06-02 DIAGNOSIS — E1165 Type 2 diabetes mellitus with hyperglycemia: Principal | ICD-10-CM

## 2013-06-02 DIAGNOSIS — D649 Anemia, unspecified: Secondary | ICD-10-CM | POA: Diagnosis not present

## 2013-06-02 LAB — CBC WITH DIFFERENTIAL/PLATELET
BASOS ABS: 0 10*3/uL (ref 0.0–0.1)
Basophils Relative: 0 % (ref 0–1)
EOS ABS: 0.2 10*3/uL (ref 0.0–0.7)
Eosinophils Relative: 4 % (ref 0–5)
HCT: 38.6 % — ABNORMAL LOW (ref 39.0–52.0)
HEMOGLOBIN: 13.2 g/dL (ref 13.0–17.0)
Lymphocytes Relative: 25 % (ref 12–46)
Lymphs Abs: 1.1 10*3/uL (ref 0.7–4.0)
MCH: 30.2 pg (ref 26.0–34.0)
MCHC: 34.2 g/dL (ref 30.0–36.0)
MCV: 88.3 fL (ref 78.0–100.0)
MONOS PCT: 7 % (ref 3–12)
Monocytes Absolute: 0.3 10*3/uL (ref 0.1–1.0)
NEUTROS PCT: 64 % (ref 43–77)
Neutro Abs: 2.9 10*3/uL (ref 1.7–7.7)
Platelets: 252 10*3/uL (ref 150–400)
RBC: 4.37 MIL/uL (ref 4.22–5.81)
RDW: 14 % (ref 11.5–15.5)
WBC: 4.6 10*3/uL (ref 4.0–10.5)

## 2013-06-02 LAB — COMPREHENSIVE METABOLIC PANEL
ALK PHOS: 44 U/L (ref 39–117)
ALT: 13 U/L (ref 0–53)
AST: 16 U/L (ref 0–37)
Albumin: 4.4 g/dL (ref 3.5–5.2)
BUN: 19 mg/dL (ref 6–23)
CO2: 29 mEq/L (ref 19–32)
Calcium: 9.4 mg/dL (ref 8.4–10.5)
Chloride: 101 mEq/L (ref 96–112)
Creat: 1.08 mg/dL (ref 0.50–1.35)
GLUCOSE: 125 mg/dL — AB (ref 70–99)
Potassium: 4.6 mEq/L (ref 3.5–5.3)
Sodium: 140 mEq/L (ref 135–145)
Total Bilirubin: 0.3 mg/dL (ref 0.3–1.2)
Total Protein: 7 g/dL (ref 6.0–8.3)

## 2013-06-02 LAB — HEMOGLOBIN A1C
Hgb A1c MFr Bld: 7.3 % — ABNORMAL HIGH (ref <5.7)
MEAN PLASMA GLUCOSE: 163 mg/dL — AB (ref <117)

## 2013-06-02 NOTE — Progress Notes (Signed)
Patient ID: Jonathan Hensley, male   DOB: 07/07/1940, 73 y.o.   MRN: [id number]   Reason for Appointment: Diabetes follow-up   History of Present Illness    Type 2 diabetes mellitus, date of diagnosis: 1994.   He has been on insulin for the last few years, starting with only bedtime NPH and subsequently progressing to basal bolus regimen He is taking Humalog at mealtimes based on carbohydrate counting Usually takes his Humalog right after eating to estimate his dosage better The patient's diabetes control is usually good with periodic high readings with lunch or dinner based on his meal content and size.  His A1c has previously been at goal below 7%. The HbgA1c was highest last year 8.2 in July.  He appears to have more fluctuation in his blood sugars recently and overall average is relatively high also Because of tendency to relatively low blood sugars on the last visit either in the morning or late evening Because of this his NPH dose was reduced at bedtime but he has also reduced his morning dosage With this his afternoon and early evening readings are relatively high  His FASTING blood sugars are relatively higher also and averaging about 150  His postprandial readings are periodically higher, with sporadic high readings in the afternoons and less often after supper Has a gained about 4 pounds since his last visit Difficulties with medication adherence: Usually taking insulin when eating out.  Proper timing of medications in relation to meals: Yes.   The insulin regimen is described as premeal rapid analog 1:10 ac (5-6 units lunch and supper), (usually carbohydrate coverage 1:10)  NPH twice a day 7--15  Glucose monitoring: Accu-Chek, checking 3.7 times a day Blood Glucose readings:   PREMEAL Breakfast Lunch Dinner Bedtime Overall  Glucose range:  127-221   147-364   67-283   97-290    Mean/median:  150   210   148   163   167    POST-MEAL PC Breakfast PC Lunch PC Dinner  Glucose  range:    167-229   Mean/median:    174      Wt Readings from Last 3 Encounters:  06/02/13 230 lb 6.4 oz (104.509 kg)  05/29/13 232 lb (105.235 kg)  05/02/13 222 lb (100.699 kg)     Lab Results  Component Value Date   HGBA1C 7.8* 12/03/2012   HGBA1C 7.5* 12/19/2011   Lab Results  Component Value Date   MICROALBUR 1.7 12/03/2012   LDLCALC 62 12/03/2012   CREATININE 1.11 05/02/2013    Meals: 2 meals per day and usually no breakfast.   Carbohydrate intake: low;  Sometimes eating out and usually lunch is the larger meal of the day, snacks peanuts.  Physical activity: exercise: Working outside, household activity.  Peripheral neuropathy: Yes.  Diabetic nephropathy: No microalbuminuria recently   Hypertension:  Home blood pressure not checked regularly as he did not think it was accurate He has had higher blood pressure readings in the doctors offices including with the sleep specialist recently.  He did not tolerate beta blockers or verapamil  Wrist cuff from home was checked here previously and was accurate.      Medication List       This list is accurate as of: 06/02/13 11:16 AM.  Always use your most recent med list.               benazepril 40 MG tablet  Commonly known as:  LOTENSIN  Take 40 mg  by mouth at bedtime.     ELIQUIS 5 MG Tabs tablet  Generic drug:  apixaban  Take 5 mg by mouth 2 (two) times daily.     ezetimibe 10 MG tablet  Commonly known as:  ZETIA  Take 10 mg by mouth daily.     fluticasone 0.05 % cream  Commonly known as:  CUTIVATE  Apply topically 2 (two) times daily.     glipiZIDE 10 MG 24 hr tablet  Commonly known as:  GLUCOTROL XL  Take 10 mg by mouth daily with breakfast.     HUMALOG KWIKPEN 100 UNIT/ML KiwkPen  Generic drug:  insulin lispro  Inject 6-8  units before meals     insulin NPH Human 100 UNIT/ML injection  Commonly known as:  HUMULIN N,NOVOLIN N  Inject 7-15 Units into the skin 2 (two) times daily. 7 units in the  morning and 15 units at bedtime.     ketoconazole 2 % cream  Commonly known as:  NIZORAL  Apply 1 application topically 2 (two) times daily.     metFORMIN 1000 MG tablet  Commonly known as:  GLUCOPHAGE  Take 1 tablet (1,000 mg total) by mouth 2 (two) times daily with a meal.     simvastatin 20 MG tablet  Commonly known as:  ZOCOR  Take 20 mg by mouth daily.        Allergies:  Allergies  Allergen Reactions  . Other Other (See Comments)    Beta or alpha blockers: severe dizziness    Past Medical History  Diagnosis Date  . History of prostate cancer   . Hypertension   . Diabetes mellitus   . Nephrolithiasis   . Diabetic retinopathy   . Bulging lumbar disc     Past Surgical History  Procedure Laterality Date  . Back surgery    . Brachytherapy    . Cystoscopy    . Left l4-l5 lumbar laminotomy and microdiskectomy with  07/14/2008    . Scleral buckle, laser photocoagulation, and anterior chamber  01/04/2009  . Bilateral l4-5 lumbar decompression including bilateral  11/2008    Family History  Problem Relation Age of Onset  . Heart failure Father   . Heart failure Mother   . Heart failure Brother     Half brother.    Social History:  reports that he has never smoked. He does not have any smokeless tobacco history on file. He reports that he does not drink alcohol or use illicit drugs.  Review of Systems - Cardiovascular ROS: positive for -  hypertension, no CAD, has been followed by cardiologist   HYPERLIPIDEMIA: Has been on simvastatin and Zetia for sometime with good results  Lab Results  Component Value Date   CHOL 133 12/03/2012   HDL 52.60 12/03/2012   LDLCALC 62 12/03/2012   TRIG 91.0 12/03/2012   CHOLHDL 3 12/03/2012   Previous history of low back pain   Has had recent  weight gain:   Wt Readings from Last 3 Encounters:  06/02/13 230 lb 6.4 oz (104.509 kg)  05/29/13 232 lb (105.235 kg)  05/02/13 222 lb (100.699 kg)    He has had retinal detachment  treated surgically in the past    Sleep apnea on CPAP    Examination:   BP 128/84  Pulse 63  Temp(Src) 98.5 F (36.9 C)  Resp 14  Ht 5\' 10"  (1.778 m)  Wt 230 lb 6.4 oz (104.509 kg)  BMI 33.06 kg/m2  SpO2 98%  Body mass index is 33.06 kg/(m^2).   No pedal edema  Assesment:   1. Diabetes type 2, uncontrolled    The patient's diabetes control is overall about the same but he appears to have significant variability in his blood sugars at all times This may be partly related to his taking NPH insulin twice a day Also because of his fear of hypoglycemia he has taken less NPH in the morning and his highest readings or midday and afternoon This is despite not eating any breakfast  He still has some postprandial readings over 200 after lunch or supper depending on his diet His compliance with insulin is generally fairly good and he is usually able to do carbohydrate counting  for his Humalog insulin See history of present illness for details of his current management, problems identified and blood sugar trends  Since he does desire better control and also wants some indication of his regimen have discussed with him in detail how V-go pump will work Also this will allow more consistent basal insulin and simplify his daily regimen His total basal insulin dose is about 22 units currently and he should do well with the 20 unit pump He can continue doing the same amount of boluses for his meals Also consider bolusing for his morning coffee since blood sugars are high at lunchtime He was instructed in detail today by nurse educator on the pump and has started the trial Today He will followup in 2 weeks for review of his management and adjust doses as needed  2. Anemia: Last hemoglobin was 12.6 and stable; stool Hemoccult  negative in 1/14  3. Hypertension: Well controlled. Advised him to switch from her wrist cuff to an arm instrument with Omron brand  He will continue the same  medications  Counseling time over 50% of today's 25 minute visit  Verniece Encarnacion 06/02/2013, 11:16 AM

## 2013-06-02 NOTE — Patient Instructions (Addendum)
HUMULIN N 9 IN AM AND 16 BEDTIME  Omron BP machine for arm

## 2013-06-03 ENCOUNTER — Telehealth: Payer: Self-pay | Admitting: Endocrinology

## 2013-06-03 LAB — MICROALBUMIN / CREATININE URINE RATIO
CREATININE, URINE: 142.4 mg/dL
MICROALB UR: 6.32 mg/dL — AB (ref 0.00–1.89)
Microalb Creat Ratio: 44.4 mg/g — ABNORMAL HIGH (ref 0.0–30.0)

## 2013-06-03 NOTE — Telephone Encounter (Signed)
If his fasting blood sugars continue to be high without high readings during the day may consider adding 5 units NPH at bedtime

## 2013-06-03 NOTE — Telephone Encounter (Signed)
Pt. Reported that he changed out his V-go without any problems and had no questions for me.  He gave his bolus insulin yesteday before lunch and supper without any difficulty.  He gave 4u acL and 2u acS.  Blood sugar yesterday 3hr. PcL: 124, and last night, 2hr. PcS: 154.  FBS today was 180.   He was told to call blood sugars to me tomorrow.  He had no final questions.

## 2013-06-03 NOTE — Progress Notes (Signed)
Pt. Was instructed on the use of the V-go 20 device. Diet review shows him eating approx. 30 grams of carbs per meal, except for pizza, and spaghetti.   He was show how to give the bolus using the bolus ready button, and the bolus delivery button.  He was instructed that each button press combination delivered 2 units of insulin, and that he should do this before each meal.   He reported good understanding of this, and redemonstrated how to do this correctly on a demo.    He filled the v-go with help from me using Novolog insulin.   He was instructed to fill this and put a new one on each day at the same time.  He reported good understanding of this. We also discussed the need to test blood sugars before meals and at bed time, and he has agreed to do this for the next 4 days.  He was given a starter kit with 6 days of V-Gos, a vial of Novolog insulin, directions for V-go use, and telephone number for a 24 hour helpline.  He was also given a sample of glucose tablets to use if he has any low blood sugars. He was told to stop all other insulin, and he agreed to do this.  He had no final questions. I will call him today after he fills and puts on his new V-go.  He will call if questions.

## 2013-06-03 NOTE — Progress Notes (Signed)
Quick Note:  Please let patient know that A1c was 7.3 ______

## 2013-06-04 ENCOUNTER — Telehealth: Payer: Self-pay | Admitting: Endocrinology

## 2013-06-04 NOTE — Telephone Encounter (Signed)
Blood sugar last night 2hr. pcS was 138.  FBS today was 124.   Please call in V-Go 20 script to his Mail order pharmacy.

## 2013-06-09 ENCOUNTER — Other Ambulatory Visit: Payer: Self-pay | Admitting: *Deleted

## 2013-06-09 MED ORDER — V-GO 20 KIT: PACK | Status: DC

## 2013-06-10 ENCOUNTER — Telehealth: Payer: Self-pay | Admitting: *Deleted

## 2013-06-11 ENCOUNTER — Other Ambulatory Visit: Payer: Self-pay | Admitting: *Deleted

## 2013-06-11 ENCOUNTER — Ambulatory Visit (INDEPENDENT_AMBULATORY_CARE_PROVIDER_SITE_OTHER): Payer: Medicare Other

## 2013-06-11 DIAGNOSIS — G479 Sleep disorder, unspecified: Secondary | ICD-10-CM

## 2013-06-11 DIAGNOSIS — G4733 Obstructive sleep apnea (adult) (pediatric): Secondary | ICD-10-CM | POA: Diagnosis not present

## 2013-06-11 DIAGNOSIS — Z9989 Dependence on other enabling machines and devices: Principal | ICD-10-CM

## 2013-06-11 DIAGNOSIS — R9431 Abnormal electrocardiogram [ECG] [EKG]: Secondary | ICD-10-CM | POA: Diagnosis not present

## 2013-06-11 MED ORDER — INSULIN ASPART 100 UNIT/ML ~~LOC~~ SOLN: 20.0000 [IU] | Freq: Once | SUBCUTANEOUS | Status: DC

## 2013-06-13 ENCOUNTER — Telehealth: Payer: Self-pay | Admitting: *Deleted

## 2013-06-13 NOTE — Telephone Encounter (Signed)
Sample of v-go left for patient to pick up

## 2013-06-17 ENCOUNTER — Ambulatory Visit: Payer: Medicare Other | Admitting: Endocrinology

## 2013-06-18 ENCOUNTER — Telehealth: Payer: Self-pay | Admitting: Neurology

## 2013-06-18 DIAGNOSIS — G4733 Obstructive sleep apnea (adult) (pediatric): Secondary | ICD-10-CM

## 2013-06-18 NOTE — Telephone Encounter (Signed)
Please call and notify patient that the recent sleep study confirmed the diagnosis of OSA. He did well with CPAP during the study with significant improvement of the respiratory events. Therefore, I would like start the patient on CPAP at home. I placed the order in the chart.   Arrange for CPAP set up at home through a DME company of patient's choice (DME is in [place], [place]) and fax/route report to PCP and referring MD (if other than PCP).   The patient will also need a follow up appointment with me in 6-8 weeks post set up that has to be scheduled; help the patient schedule this (in a follow-up slot).   Please re-enforce the importance of compliance with treatment and the need for [place] to monitor compliance data.   Once you have spoken to the patient and scheduled the return appointment, you may close this encounter, thanks,   Star Age, MD, PhD [facility]

## 2013-06-23 ENCOUNTER — Encounter: Payer: Self-pay | Admitting: *Deleted

## 2013-06-23 NOTE — Telephone Encounter (Signed)
I called and spoke with the patient about his recent sleep study results. I informed the patient that the study revealed the diagnosis of obstructive sleep apnea. Dr. Rexene Alberts recommends CPAP therapy at home. Patient stated he was already on CPAP, so I informed the patient that his setting his changed from 9 cm to 13 cm. Patient stated that was too high. I told the patient that Dr. Rexene Alberts has stated that it would take time for him to adjust to the new setting. Patient was upset with the setting, but I informed him that the setting is what he was on during the night of his study. Patient was okay. I will fax a copy of the report to Dr. Irven Shelling office and Dr. Ronnie Derby office.

## 2013-06-24 DIAGNOSIS — H35359 Cystoid macular degeneration, unspecified eye: Secondary | ICD-10-CM | POA: Diagnosis not present

## 2013-06-24 DIAGNOSIS — E1139 Type 2 diabetes mellitus with other diabetic ophthalmic complication: Secondary | ICD-10-CM | POA: Diagnosis not present

## 2013-06-24 DIAGNOSIS — E11319 Type 2 diabetes mellitus with unspecified diabetic retinopathy without macular edema: Secondary | ICD-10-CM | POA: Diagnosis not present

## 2013-06-24 DIAGNOSIS — H35379 Puckering of macula, unspecified eye: Secondary | ICD-10-CM | POA: Diagnosis not present

## 2013-06-26 ENCOUNTER — Encounter: Payer: Self-pay | Admitting: Neurology

## 2013-06-26 ENCOUNTER — Other Ambulatory Visit: Payer: Self-pay | Admitting: Neurology

## 2013-06-26 ENCOUNTER — Ambulatory Visit (INDEPENDENT_AMBULATORY_CARE_PROVIDER_SITE_OTHER): Payer: Medicare Other | Admitting: Endocrinology

## 2013-06-26 ENCOUNTER — Encounter: Payer: Self-pay | Admitting: Endocrinology

## 2013-06-26 ENCOUNTER — Telehealth: Payer: Self-pay | Admitting: Neurology

## 2013-06-26 VITALS — BP 130/82 | HR 73 | Temp 97.9°F | Resp 14 | Ht 70.0 in | Wt 231.6 lb

## 2013-06-26 DIAGNOSIS — D649 Anemia, unspecified: Secondary | ICD-10-CM

## 2013-06-26 DIAGNOSIS — E119 Type 2 diabetes mellitus without complications: Secondary | ICD-10-CM | POA: Diagnosis not present

## 2013-06-26 DIAGNOSIS — I10 Essential (primary) hypertension: Secondary | ICD-10-CM

## 2013-06-26 DIAGNOSIS — E785 Hyperlipidemia, unspecified: Secondary | ICD-10-CM

## 2013-06-26 DIAGNOSIS — E1139 Type 2 diabetes mellitus with other diabetic ophthalmic complication: Secondary | ICD-10-CM

## 2013-06-26 DIAGNOSIS — E11319 Type 2 diabetes mellitus with unspecified diabetic retinopathy without macular edema: Secondary | ICD-10-CM

## 2013-06-26 DIAGNOSIS — G4733 Obstructive sleep apnea (adult) (pediatric): Secondary | ICD-10-CM

## 2013-06-26 DIAGNOSIS — Z9989 Dependence on other enabling machines and devices: Principal | ICD-10-CM

## 2013-06-26 NOTE — Progress Notes (Signed)
Patient ID: Jonathan Hensley, male   DOB: July 13, 1940, 73 y.o.   MRN: [id number]   Reason for Appointment: Diabetes follow-up   History of Present Illness    Type 2 diabetes mellitus, date of diagnosis: 1994.   He has been on insulin for the last few years, starting with only bedtime NPH and subsequently progressing to basal bolus regimen Also taking glipizide and metformin long-term  The HbgA1c was highest in 2013 at 8.2  Recent history:  Because of more fluctuation in his blood sugars at all times and needing higher doses of insulin he was switched to the V.-go pump 05/2013 He also had some difficulty with compliance with mealtime insulin when eating out especially lunch He is using the 20 basal rate and also doing boluses at meals and snacks with carbohydrate counting as before Since then his blood sugars have been generally more consistent and he is very pleased that the level of control and convenience of the pump The insulin regimen is described as basal rate 20 units on V- go pump; premeal rapid analog usually carbohydrate coverage 1:10  Difficulties with medication adherence: None recently  Proper timing of medications in relation to meals: Yes.   Glucose monitoring: Accu-Chek, checking 4.6 times a day Blood Glucose readings:   PREMEAL Breakfast Lunch Dinner Bedtime Overall  Glucose range: 71-169  61-160   69-219   90-170    Mean/median:  122   145   130  136  141   Hypoglycemia: Only one recent low sugar at lunchtime of 61    Wt Readings from Last 3 Encounters:  06/26/13 231 lb 9.6 oz (105.053 kg)  06/12/13 222 lb (100.699 kg)  06/02/13 230 lb 6.4 oz (104.509 kg)   Meals: 2 meals per day and usually no breakfast.   Carbohydrate intake: low;  Sometimes eating out and usually lunch is the larger meal of the day, snacks peanuts.  Physical activity: exercise: Working outside, household activity.  Peripheral neuropathy: Yes.  Diabetic nephropathy: Occasional mild  microalbuminuria     Lab Results  Component Value Date   HGBA1C 7.3* 06/02/2013   HGBA1C 7.8* 12/03/2012   HGBA1C 7.5* 12/19/2011   Lab Results  Component Value Date   MICROALBUR 6.32* 06/02/2013   LDLCALC 62 12/03/2012   CREATININE 1.08 06/02/2013         Medication List       This list is accurate as of: 06/26/13 11:59 PM.  Always use your most recent med list.               benazepril 40 MG tablet  Commonly known as:  LOTENSIN  Take 40 mg by mouth at bedtime.     ELIQUIS 5 MG Tabs tablet  Generic drug:  apixaban  Take 5 mg by mouth 2 (two) times daily.     ezetimibe 10 MG tablet  Commonly known as:  ZETIA  Take 10 mg by mouth daily.     fluticasone 0.05 % cream  Commonly known as:  CUTIVATE  Apply topically 2 (two) times daily.     glipiZIDE 10 MG 24 hr tablet  Commonly known as:  GLUCOTROL XL  Take 10 mg by mouth daily with breakfast.     insulin aspart 100 UNIT/ML injection  Commonly known as:  novoLOG  Inject 20 Units into the skin once. Use with V-Go pump     ketoconazole 2 % cream  Commonly known as:  NIZORAL  Apply 1 application topically  2 (two) times daily.     metFORMIN 1000 MG tablet  Commonly known as:  GLUCOPHAGE  Take 1 tablet (1,000 mg total) by mouth 2 (two) times daily with a meal.     simvastatin 20 MG tablet  Commonly known as:  ZOCOR  Take 20 mg by mouth daily.     V-GO 20 Kit  Use one per day        Allergies:  Allergies  Allergen Reactions  . Other Other (See Comments)    Beta or alpha blockers: severe dizziness    Past Medical History  Diagnosis Date  . History of prostate cancer   . Hypertension   . Diabetes mellitus   . Nephrolithiasis   . Diabetic retinopathy   . Bulging lumbar disc     Past Surgical History  Procedure Laterality Date  . Back surgery    . Brachytherapy    . Cystoscopy    . Left l4-l5 lumbar laminotomy and microdiskectomy with  07/14/2008    . Scleral buckle, laser photocoagulation, and  anterior chamber  01/04/2009  . Bilateral l4-5 lumbar decompression including bilateral  11/2008    Family History  Problem Relation Age of Onset  . Heart failure Father   . Heart failure Mother   . Heart failure Brother     Half brother.    Social History:  reports that he has never smoked. He does not have any smokeless tobacco history on file. He reports that he does not drink alcohol or use illicit drugs.  Review of Systems  Hypertension:  Home blood pressure not checked regularly. Was advised to use an [employer] monitor Blood pressure is very well controlled now with benazepril alone  HYPERLIPIDEMIA: Has been on simvastatin and Zetia for sometime with good results  Lab Results  Component Value Date   CHOL 133 12/03/2012   HDL 52.60 12/03/2012   LDLCALC 62 12/03/2012   TRIG 91.0 12/03/2012   CHOLHDL 3 12/03/2012   Previous history of low back pain   He has had retinal detachment treated surgically in the past, followed by ophthalmologist periodically    Sleep apnea, is on CPAP   Anemia: Last hemoglobin was 12.6 and stable; stool Hemoccult  negative in 1/14   Examination:   BP 130/82  Pulse 73  Temp(Src) 97.9 F (36.6 C)  Resp 14  Ht 5' 10"  (1.778 m)  Wt 231 lb 9.6 oz (105.053 kg)  BMI 33.23 kg/m2  SpO2 97%  Body mass index is 33.23 kg/(m^2).    Assesment:   1. Diabetes type 2, uncontrolled    The patient's diabetes control is overall better with less fluctuation on the V.-go pump. He also has somewhat less fluctuation especially recently His blood sugar and maybe occasionally higher in the afternoon and evenings, probably from some noncompliance with diet or insulin However his average blood sugars at any given time range from 111-168 with no significant hypoglycemia Subjectively he is very pleased with the use of the pump and had no difficulties with this  3. Hypertension: Well controlled.  He will continue the same medications   Jonathan Hensley 06/28/2013, 5:43  PM    No visits with results within 1 Week(s) from this visit. Latest known visit with results is:  Orders Only on 06/02/2013  Component Date Value Ref Range Status  . Microalb, Ur 06/02/2013 6.32* 0.00 - 1.89 mg/dL Final  . Creatinine, Urine 06/02/2013 142.4   Final  . Microalb Creat Ratio 06/02/2013 44.4* 0.0 -  30.0 mg/g Final  . WBC 06/02/2013 4.6  4.0 - 10.5 K/uL Final  . RBC 06/02/2013 4.37  4.22 - 5.81 MIL/uL Final  . Hemoglobin 06/02/2013 13.2  13.0 - 17.0 g/dL Final  . HCT 06/02/2013 38.6* 39.0 - 52.0 % Final  . MCV 06/02/2013 88.3  78.0 - 100.0 fL Final  . MCH 06/02/2013 30.2  26.0 - 34.0 pg Final  . MCHC 06/02/2013 34.2  30.0 - 36.0 g/dL Final  . RDW 06/02/2013 14.0  11.5 - 15.5 % Final  . Platelets 06/02/2013 252  150 - 400 K/uL Final  . Neutrophils Relative % 06/02/2013 64  43 - 77 % Final  . Neutro Abs 06/02/2013 2.9  1.7 - 7.7 K/uL Final  . Lymphocytes Relative 06/02/2013 25  12 - 46 % Final  . Lymphs Abs 06/02/2013 1.1  0.7 - 4.0 K/uL Final  . Monocytes Relative 06/02/2013 7  3 - 12 % Final  . Monocytes Absolute 06/02/2013 0.3  0.1 - 1.0 K/uL Final  . Eosinophils Relative 06/02/2013 4  0 - 5 % Final  . Eosinophils Absolute 06/02/2013 0.2  0.0 - 0.7 K/uL Final  . Basophils Relative 06/02/2013 0  0 - 1 % Final  . Basophils Absolute 06/02/2013 0.0  0.0 - 0.1 K/uL Final  . Smear Review 06/02/2013 Criteria for review not met   Final  . Sodium 06/02/2013 140  135 - 145 mEq/L Final  . Potassium 06/02/2013 4.6  3.5 - 5.3 mEq/L Final  . Chloride 06/02/2013 101  96 - 112 mEq/L Final  . CO2 06/02/2013 29  19 - 32 mEq/L Final  . Glucose, Bld 06/02/2013 125* 70 - 99 mg/dL Final  . BUN 06/02/2013 19  6 - 23 mg/dL Final  . Creat 06/02/2013 1.08  0.50 - 1.35 mg/dL Final  . Total Bilirubin 06/02/2013 0.3  0.3 - 1.2 mg/dL Final  . Alkaline Phosphatase 06/02/2013 44  39 - 117 U/L Final  . AST 06/02/2013 16  0 - 37 U/L Final  . ALT 06/02/2013 13  0 - 53 U/L Final  . Total Protein  06/02/2013 7.0  6.0 - 8.3 g/dL Final  . Albumin 06/02/2013 4.4  3.5 - 5.2 g/dL Final  . Calcium 06/02/2013 9.4  8.4 - 10.5 mg/dL Final  . Hemoglobin A1C 06/02/2013 7.3* <5.7 % Final   Comment:                                                                                                 According to the ADA Clinical Practice Recommendations for 2011, when                          HbA1c is used as a screening test:                                                       >=6.5%  Diagnostic of Diabetes Mellitus                                     (if abnormal result is confirmed)                                                     5.7-6.4%   Increased risk of developing Diabetes Mellitus                                                     References:Diagnosis and Classification of Diabetes Mellitus,Diabetes                          YJEH,6314,97(WYOVZ 1):S62-S69 and Standards of Medical Care in                                  Diabetes - 2011,Diabetes Care,2011,34 (Suppl 1):S11-S61.                             . Mean Plasma Glucose 06/02/2013 163* <117 mg/dL Final

## 2013-06-26 NOTE — Patient Instructions (Addendum)
Stop Glipizide  Take bolus click right at meal time

## 2013-06-26 NOTE — Telephone Encounter (Signed)
Dr. Rexene Alberts, This patient has come into the office today to complain that his CPAP pressure is too high for him to tolerate.  He brings in his download from December in which he points out that his AHI was only 2.7 on a pressure of 9cm.  He is refusing to leave the office.  I explained to Mr. Jonathan Hensley that Dr. Rexene Alberts has a full schedule and would not be able to provide him immediate assistance.  He agrees to leave, but will return to the office after 11am.  He has his sleep study report and says that the numbers are incorrect.  He feels that the reason that the setting was so high is because the wrong mask was used.  He has brought in his mask (Mirage Activa LT, sz Large) and it is not a full face as was used during the test.  He said that him refusing to try another type of mask as was indicated in the report is a falsehood.  When the mask was placed on him, the technician repeatedly kept trying to tighten it because of the amount of air blowing out.  This resulted in the tech having to continue to raise the pressure.  He is requesting for his pressure to be reduced back to 9 and to test him on this setting for 30 days and see what the results will be.  If it doesn't work, he says that he will try whatever setting the Dr. recommends.

## 2013-07-14 ENCOUNTER — Encounter: Payer: Self-pay | Admitting: Neurology

## 2013-07-23 NOTE — Progress Notes (Signed)
Quick Note:  I reviewed the patient's CPAP compliance data from 06/25/2013 to 07/13/2013, which is a total of 19 days, during which time the patient used CPAP every day. The average usage for all days was 7 hours and 13 minutes. The percent used days greater than 4 hours was 100 %, indicating excellent compliance. The residual AHI was 2.3 per hour, indicating an appropriate treatment pressure of 9 cwp with EPR of 2. I will review this data with the patient at the next office visit, provide feedback and additional troubleshooting if need be. He is currently scheduled for her 07/30/2013 at 9 AM.  Star Age, MD, PhD [facility] ([place])   ______

## 2013-07-25 DIAGNOSIS — N200 Calculus of kidney: Secondary | ICD-10-CM | POA: Diagnosis not present

## 2013-07-25 DIAGNOSIS — C61 Malignant neoplasm of prostate: Secondary | ICD-10-CM | POA: Diagnosis not present

## 2013-07-28 ENCOUNTER — Encounter: Payer: Self-pay | Admitting: Neurology

## 2013-07-29 ENCOUNTER — Encounter: Payer: Self-pay | Admitting: Neurology

## 2013-07-30 ENCOUNTER — Ambulatory Visit (INDEPENDENT_AMBULATORY_CARE_PROVIDER_SITE_OTHER): Payer: Medicare Other | Admitting: Neurology

## 2013-07-30 ENCOUNTER — Encounter: Payer: Self-pay | Admitting: Neurology

## 2013-07-30 ENCOUNTER — Encounter (INDEPENDENT_AMBULATORY_CARE_PROVIDER_SITE_OTHER): Payer: Self-pay

## 2013-07-30 VITALS — BP 168/78 | HR 60 | Temp 98.4°F | Ht 70.0 in | Wt 231.0 lb

## 2013-07-30 DIAGNOSIS — Z9989 Dependence on other enabling machines and devices: Principal | ICD-10-CM

## 2013-07-30 DIAGNOSIS — G4733 Obstructive sleep apnea (adult) (pediatric): Secondary | ICD-10-CM | POA: Diagnosis not present

## 2013-07-30 DIAGNOSIS — I4891 Unspecified atrial fibrillation: Secondary | ICD-10-CM

## 2013-07-30 NOTE — Patient Instructions (Addendum)
Please continue using your CPAP regularly. While your insurance requires that you use CPAP at least 4 hours each night on 70% of the nights, I recommend, that you not skip any nights and use it throughout the night if you can. Getting used to CPAP does take time and patience and discipline. Untreated obstructive sleep apnea when it is moderate to severe can have an adverse impact on cardiovascular health and raise her risk for heart disease, arrhythmias, hypertension, congestive heart failure, stroke and diabetes. Untreated obstructive sleep apnea causes sleep disruption, nonrestorative sleep, and sleep deprivation. This can have an impact on your day to day functioning and cause daytime sleepiness and impairment of cognitive function, memory loss, mood disturbance, and problems focussing. Using CPAP regularly can improve these symptoms.  Keep up the good work! I will see you back yearly from here.

## 2013-07-30 NOTE — Progress Notes (Signed)
Subjective:    Patient ID: Jonathan Hensley is a 73 y.o. male.  HPI    Interim history:   Jonathan Hensley is a very friendly 73 year old right-handed gentleman with an underlying medical history of type 2 diabetes, a fib, anemia, hypertension, hyperlipidemia, prostate cancer, and diabetic retinopathy, who presents for followup consultation of his obstructive sleep apnea. He is unaccompanied today. I first met him on 05/29/2013 at the request of his cardiologist, Dr. Einar Gip, at which time he needed to have a reevaluation of his sleep apnea to qualify for a new CPAP machine. I ordered a split-night sleep study. He had split-night sleep study on 06/11/2013 and I went over his test results with him in detail today. Baseline sleep efficiency was 77.4% with a latency to sleep of 9.5 minutes and wake after sleep onset of 19.5 minutes with mild to moderate sleep fragmentation noted. Baseline arousal index was 16.9 per hour. He had an increased percentage of stage I and stage II sleep, absence of slow-wave sleep at 14.6% of REM sleep with a normal REM latency. He had occasional PVCs and PACs on single-lead EKG. He had moderate snoring. His total AHI was 27.1 per hour. His RDI was 32 per hour. Baseline oxygen saturation was 92% with a nadir of 86% with REM sleep. Time below 88% saturation was 1 minute and 44 seconds. He was then titrated on CPAP from a pressure of 5 cm to 13 cm. AHI was 0 per hour on a pressure of 13. He tried a full face mask. However the mask use was not his typical mask. I did prescribe a pressure of 13 cm but the patient returned to the sleep lab on 06/26/2013 reporting that the pressure was too high. He did not feel comfortable with the increased pressure. Therefore, I reduced the pressure back to 9 cm and the patient certainly did point out that he had done well on a setting of 9 cm before which I agree. I reviewed the patient's CPAP compliance data from 06/25/2013 to 07/13/2013, which is a total of  19 days, during which time the patient used CPAP every day. The average usage for all days was 7 hours and 13 minutes. The percent used days greater than 4 hours was 100 %, indicating excellent compliance. The residual AHI was 2.3 per hour, indicating an appropriate treatment pressure of 9 cwp with EPR of 2. Today, I reviewed his compliance data from 06/25/2013 through 07/29/2013 which is the last 35 days, during which time he is CPAP every day. Percent used days greater than 4 hours was 100% and average usage was 7 hours and 25 minutes. Residual AHI was 2.6, indicating inadequate set pressure of 9.4, with EPR of 2. Leak was acceptable with 95th percentile of leak at 26.6 L per minute.  Today, he reports that he is feeling well. He is pleased with his new machine, using his usual mask. He is fully compliant. He now has an insulin pump. He is no longer on Glucotrol. He is followed with [name] for his diabetes. He has no new complaints today.  He was diagnosed with obstructive sleep apnea about 28 years ago. He has been on CPAP since then but required re-evaluation and re-testing to qualify for a new CPAP machine. He indicated a Dx of severe OSA. He has a strong FHx of OSA and stroke.  He gets his CPAP supplies from [facility] in [place], [place].  He had a LexiScan stress  test on 02/22/2013 which was reported as normal. Holter monitor from August 2013 showed brief A. fib with rapid ventricular response. Echocardiogram from August 2013 showed normal EF, mild mitral valve calcification, otherwise unremarkable.  He quit smoking cigarettes in 1974. He has a family history of heart disease, including MRI and his father and congestive heart failure in his mother as well as heart attack at age 62 in his half brother.  I reviewed 10/09/12 to 04/21/13 (195 days), during which time the patient used CPAP every day. Average usage for all days was 7 hours and 30 minutes. The percent used days greater  than 4 hours was 100%, indicating excellent compliance. The residual AHI was 2.8 per hour, indicating a good treatment pressure of 9 cwp.  He consumes 8 caffeinated beverages per day, usually in the form of coffee. He is retired from [employer].  His bedroom is usually dark and cool. There is a TV in the bedroom and usually it is not on at night, he turns it off after the nightly news.    His Past Medical History Is Significant For: Past Medical History  Diagnosis Date  . History of prostate cancer   . Hypertension   . Diabetes mellitus   . Nephrolithiasis   . Diabetic retinopathy   . Bulging lumbar disc     His Past Surgical History Is Significant For: Past Surgical History  Procedure Laterality Date  . Back surgery    . Brachytherapy    . Cystoscopy    . Left l4-l5 lumbar laminotomy and microdiskectomy with  07/14/2008    . Scleral buckle, laser photocoagulation, and anterior chamber  01/04/2009  . Bilateral l4-5 lumbar decompression including bilateral  11/2008    His Family History Is Significant For: Family History  Problem Relation Age of Onset  . Heart failure Father   . Heart failure Mother   . Heart failure Brother     Half brother.    His Social History Is Significant For: History   Social History  . Marital Status: Married    Spouse Name: N/A    Number of Children: N/A  . Years of Education: N/A   Social History Main Topics  . Smoking status: Never Smoker   . Smokeless tobacco: None  . Alcohol Use: No  . Drug Use: No  . Sexual Activity: Yes   Other Topics Concern  . None   Social History Narrative  . None    His Allergies Are:  Allergies  Allergen Reactions  . Other Other (See Comments)    Beta or alpha blockers: severe dizziness  :   His Current Medications Are:  Outpatient Encounter Prescriptions as of 07/30/2013  Medication Sig  . apixaban (ELIQUIS) 5 MG TABS tablet Take 5 mg by mouth 2 (two) times daily.  . benazepril (LOTENSIN) 40 MG  tablet Take 40 mg by mouth at bedtime.   Marland Kitchen ezetimibe (ZETIA) 10 MG tablet Take 10 mg by mouth daily.  . fluticasone (CUTIVATE) 0.05 % cream Apply topically 2 (two) times daily.  . insulin aspart (NOVOLOG) 100 UNIT/ML injection Inject 20 Units into the skin once. Use with V-Go pump  . Insulin Disposable Pump (V-GO 20) KIT Use one per day  . ketoconazole (NIZORAL) 2 % cream Apply 1 application topically 2 (two) times daily.  . metFORMIN (GLUCOPHAGE) 1000 MG tablet Take 1 tablet (1,000 mg total) by mouth 2 (two) times daily with a meal.  . simvastatin (ZOCOR) 20 MG tablet  Take 20 mg by mouth daily.  . [DISCONTINUED] glipiZIDE (GLUCOTROL XL) 10 MG 24 hr tablet Take 10 mg by mouth daily with breakfast.  :  Review of Systems:  Out of a complete 14 point review of systems, all are reviewed and negative with the exception of these symptoms as listed below:  Review of Systems  Constitutional: Negative.   HENT: Negative.   Eyes: Positive for visual disturbance (diplopia).  Respiratory: Positive for apnea.   Cardiovascular: Negative.   Gastrointestinal: Negative.   Endocrine: Negative.   Genitourinary: Negative.   Musculoskeletal: Negative.   Skin: Negative.   Allergic/Immunologic: Negative.   Neurological: Negative.   Hematological: Negative.   Psychiatric/Behavioral: Positive for sleep disturbance.    Objective:  Neurologic Exam  Physical Exam Physical Examination:   Filed Vitals:   07/30/13 0843  BP: 168/78  Pulse: 60  Temp: 98.4 F (36.9 C)    General Examination: The patient is a very pleasant 73 y.o. male in no acute distress. He appears well-developed and well-nourished and well groomed.   HEENT: Normocephalic, atraumatic, pupils are equal, round and reactive to light and accommodation. There is evidence of R ptosis, which is not new. He has cataract on the L and s/p cataract repair on the R. Extraocular tracking is good without limitation to gaze excursion or nystagmus  noted. Normal smooth pursuit is noted. Hearing is grossly intact.  Chest: Clear to auscultation without wheezing, rhonchi or crackles noted.  Heart: S1+S2+0, regular and normal without murmurs, rubs or gallops noted.   Abdomen: Soft, non-tender and non-distended with normal bowel sounds appreciated on auscultation.  Extremities: Unchanged.   Skin: Warm and dry without trophic changes noted.   Musculoskeletal: exam reveals no obvious joint deformities, tenderness or joint swelling or erythema, except for chronic arthritic changes seen in his hands..   Neurologically:  Mental status: The patient is awake, alert and oriented in all 4 spheres. His memory, attention, language and knowledge are appropriate. There is no aphasia, agnosia, apraxia or anomia. Speech is clear with normal prosody and enunciation. Thought process is linear. Mood is congruent and affect is normal.  Cranial nerves are as described above under HEENT exam. In addition, shoulder shrug is normal with equal shoulder height noted. Motor exam: Normal bulk, strength and tone is noted. There is no truncal or gait ataxia.  Sensory exam is intact to light touch.  Gait, station and balance are unremarkable.   Assessment and Plan:   In summary, Jonathan Hensley is a very pleasant 73 y.o. old male with an underlying medical history of type 2 diabetes, a fib, anemia, hypertension, hyperlipidemia, prostate cancer, s/p XRT and seed implants, and diabetic retinopathy, who was diagnosed with obstructive sleep apnea about 28 years ago. He has been on CPAP since then but required re-evaluation and re-testing to qualify for a new CPAP machine. His exam continues to be largely nonfocal. He had a sleep study and we went over the test results. He has been on CPAP at 9 cm after be readjusted the pressure down from 13 cm. He has been fully compliant and I reviewed his compliance state as well. I congratulated him to be fully compliant with therapy and it  looks like the treatment pressure of 9.4 cm is appropriate and his residual AHI is 2.6 per hour. The air leak is acceptable. He is comfortable with this mask. He has been using filter bottled water for his humidifier and I've advised him to use distilled water  and he is agreeable. Since he has been on CPAP therapy all these years and is well acquainted with the machine and the mask and pressure, and he is doing so well at this point, I can see him back yearly from here on onwards. He was in agreement. I answered all his questions and encouraged him to call with any interim problems. Most of my 20 minute visit today was spent in counseling and coordination of care, reviewing test results and reviewing medications and compliance.

## 2013-08-06 ENCOUNTER — Other Ambulatory Visit: Payer: Self-pay | Admitting: Endocrinology

## 2013-08-09 ENCOUNTER — Encounter (HOSPITAL_COMMUNITY): Payer: Self-pay | Admitting: Emergency Medicine

## 2013-08-09 ENCOUNTER — Emergency Department (HOSPITAL_COMMUNITY)
Admission: EM | Admit: 2013-08-09 | Discharge: 2013-08-09 | Disposition: A | Discharge status: Home / Self Care | Payer: Medicare Other | Attending: Emergency Medicine | Admitting: Emergency Medicine

## 2013-08-09 ENCOUNTER — Emergency Department (HOSPITAL_COMMUNITY): Payer: Medicare Other

## 2013-08-09 DIAGNOSIS — IMO0002 Reserved for concepts with insufficient information to code with codable children: Secondary | ICD-10-CM | POA: Insufficient documentation

## 2013-08-09 DIAGNOSIS — Z794 Long term (current) use of insulin: Secondary | ICD-10-CM | POA: Insufficient documentation

## 2013-08-09 DIAGNOSIS — M25559 Pain in unspecified hip: Secondary | ICD-10-CM | POA: Diagnosis not present

## 2013-08-09 DIAGNOSIS — I10 Essential (primary) hypertension: Secondary | ICD-10-CM | POA: Diagnosis not present

## 2013-08-09 DIAGNOSIS — M539 Dorsopathy, unspecified: Secondary | ICD-10-CM | POA: Diagnosis not present

## 2013-08-09 DIAGNOSIS — M545 Low back pain, unspecified: Secondary | ICD-10-CM | POA: Diagnosis not present

## 2013-08-09 DIAGNOSIS — E11319 Type 2 diabetes mellitus with unspecified diabetic retinopathy without macular edema: Secondary | ICD-10-CM | POA: Insufficient documentation

## 2013-08-09 DIAGNOSIS — E1139 Type 2 diabetes mellitus with other diabetic ophthalmic complication: Secondary | ICD-10-CM | POA: Insufficient documentation

## 2013-08-09 DIAGNOSIS — Z87442 Personal history of urinary calculi: Secondary | ICD-10-CM | POA: Diagnosis not present

## 2013-08-09 DIAGNOSIS — Z7902 Long term (current) use of antithrombotics/antiplatelets: Secondary | ICD-10-CM | POA: Diagnosis not present

## 2013-08-09 DIAGNOSIS — Z8546 Personal history of malignant neoplasm of prostate: Secondary | ICD-10-CM | POA: Diagnosis not present

## 2013-08-09 DIAGNOSIS — Z79899 Other long term (current) drug therapy: Secondary | ICD-10-CM | POA: Insufficient documentation

## 2013-08-09 DIAGNOSIS — M79609 Pain in unspecified limb: Secondary | ICD-10-CM | POA: Diagnosis not present

## 2013-08-09 DIAGNOSIS — M79652 Pain in left thigh: Secondary | ICD-10-CM

## 2013-08-09 MED ORDER — OXYCODONE-ACETAMINOPHEN 5-325 MG PO TABS: 2.0000 | ORAL_TABLET | ORAL | Status: DC | PRN

## 2013-08-09 MED ORDER — KETOROLAC TROMETHAMINE 60 MG/2ML IM SOLN: 60.0000 mg | Freq: Once | INTRAMUSCULAR | Status: DC

## 2013-08-09 MED ORDER — CYCLOBENZAPRINE HCL 10 MG PO TABS: 10.0000 mg | ORAL_TABLET | Freq: Three times a day (TID) | ORAL | Status: DC | PRN

## 2013-08-09 NOTE — ED Notes (Signed)
Pt c/o left leg pain that started at left foot area and radiates up left leg area, has tried heat without improvement, denies any injury.has had pain like this before when he had problems with his back,

## 2013-08-09 NOTE — Discharge Instructions (Signed)
X-ray did not show any acute changes. Prescriptions for pain medicine and muscle relaxer. Call the neurosurgeon next week for a followup appointment.

## 2013-08-09 NOTE — ED Provider Notes (Signed)
CSN: [id number]     Arrival date & time 08/09/13  1538 History  This chart was scribed for Nat Christen, MD by Zettie Pho, ED Scribe. This patient was seen in room [facility]05/[facility]05 and the patient's care was started at 4:00 PM.    Chief Complaint  Patient presents with  . Leg Pain   The history is provided by the patient. No language interpreter was used.   HPI Comments: Jonathan Hensley is a 73 y.o. male who with a history of bulging lumbar disc, L4-L5 lumbar laminotomy and microdiskectomy, and bilateral L4-L5 lumbar decompression (performed by [name]) presents to the Emergency Department complaining of a constant pain to the left, posterior thigh that he states radiates up to the hip/left side of the lower back and all the way down to the ankle onset yesterday. He states that he has been ambulatory, but that the pain is exacerbated with walking/bearing weight. Patient denies any potential injury or trauma to the area or recent heavy lifting. He reports applying heat to the area at home without relief. Patient also has a history of prostate cancer, HTN, DM, and diabetic retinopathy. Patient reports that he does not currently have a PCP.   Cardiologist- Dr. Einar Gip Endocrinologist- [name]  Urologist- Dr. Verner Chol   Past Medical History  Diagnosis Date  . History of prostate cancer   . Hypertension   . Diabetes mellitus   . Nephrolithiasis   . Diabetic retinopathy   . Bulging lumbar disc    Past Surgical History  Procedure Laterality Date  . Back surgery    . Brachytherapy    . Cystoscopy    . Left l4-l5 lumbar laminotomy and microdiskectomy with  07/14/2008    . Scleral buckle, laser photocoagulation, and anterior chamber  01/04/2009  . Bilateral l4-5 lumbar decompression including bilateral  11/2008   Family History  Problem Relation Age of Onset  . Heart failure Father   . Heart failure Mother   . Heart failure Brother     Half brother.   History  Substance Use Topics  .  Smoking status: Never Smoker   . Smokeless tobacco: Not on file  . Alcohol Use: No    Review of Systems  A complete 10 system review of systems was obtained and all systems are negative except as noted in the HPI and PMH.    Allergies  Other  Home Medications   Current Outpatient Rx  Name  Route  Sig  Dispense  Refill  . acetaminophen (TYLENOL) 500 MG tablet   Oral   Take 500 mg by mouth every 8 (eight) hours as needed. pain         . apixaban (ELIQUIS) 5 MG TABS tablet   Oral   Take 5 mg by mouth 2 (two) times daily.         . benazepril (LOTENSIN) 40 MG tablet   Oral   Take 40 mg by mouth at bedtime.          Marland Kitchen ezetimibe (ZETIA) 10 MG tablet   Oral   Take 10 mg by mouth daily.         . fluticasone (CUTIVATE) 0.05 % cream   Topical   Apply topically 2 (two) times daily.   60 g   3   . insulin aspart (NOVOLOG) 100 UNIT/ML injection   Subcutaneous   Inject 20 Units into the skin once. Use with V-Go pump   3 vial   3   .  ketoconazole (NIZORAL) 2 % cream   Topical   Apply 1 application topically 2 (two) times daily.   60 g   3   . metFORMIN (GLUCOPHAGE) 1000 MG tablet   Oral   Take 1 tablet (1,000 mg total) by mouth 2 (two) times daily with a meal.   180 tablet   3   . simvastatin (ZOCOR) 20 MG tablet   Oral   Take 20 mg by mouth daily.         . cyclobenzaprine (FLEXERIL) 10 MG tablet   Oral   Take 1 tablet (10 mg total) by mouth 3 (three) times daily as needed for muscle spasms.   30 tablet   0   . oxyCODONE-acetaminophen (PERCOCET) 5-325 MG per tablet   Oral   Take 2 tablets by mouth every 4 (four) hours as needed.   20 tablet   0   . oxyCODONE-acetaminophen (PERCOCET) 5-325 MG per tablet   Oral   Take 2 tablets by mouth every 4 (four) hours as needed.   6 tablet   0    Triage Vitals: BP 161/80  Pulse 72  Temp(Src) 98.9 F (37.2 C) (Oral)  Resp 12  SpO2 97%  Physical Exam  Nursing note and vitals  reviewed. Constitutional: He is oriented to person, place, and time. He appears well-developed and well-nourished.  HENT:  Head: Normocephalic and atraumatic.  Eyes: Conjunctivae and EOM are normal. Pupils are equal, round, and reactive to light.  Neck: Normal range of motion. Neck supple.  Cardiovascular: Normal rate, regular rhythm and normal heart sounds.   Pulmonary/Chest: Effort normal and breath sounds normal.  Abdominal: Soft. Bowel sounds are normal.  Musculoskeletal: Normal range of motion.  Mild tenderness to palpation to the left, lumbar paraspinal muscles and to the left, posterior thigh.   Neurological: He is alert and oriented to person, place, and time.  Antalgic gait, favoring the right leg.   Skin: Skin is warm and dry.  Psychiatric: He has a normal mood and affect. His behavior is normal.    ED Course  Procedures (including critical care time)  DIAGNOSTIC STUDIES: Oxygen Saturation is 9% on room air, normal by my interpretation.    COORDINATION OF CARE: 4:13 PM- Will order an x-ray of the L spine. Will discharge patient with pain medication to manage symptoms. Advised patient to follow up with [name] on Monday, in 2 days. Discussed treatment plan with patient at bedside and patient verbalized agreement.     Labs Review Labs Reviewed - No data to display  Imaging Review Dg Lumbar Spine Complete  08/09/2013   CLINICAL DATA:  Status post L4-5 surgery 2010. Low back pain. Left leg pain.  EXAM: LUMBAR SPINE - COMPLETE 4+ VIEW  COMPARISON:  CT ABD/PELVIS W CM dated 12/19/2011  FINDINGS: Changes of posterior fusion at L4-5. Normal alignment. No hardware complicating feature. No fracture or subluxation. SI joints are symmetric and unremarkable.  IMPRESSION: Prior posterior fusion L4-5.  No acute findings.   Electronically Signed   By: Rolm Baptise M.D.   On: 08/09/2013 17:19     EKG Interpretation None      MDM   Final diagnoses:  Low back pain  Left thigh  pain   Patient is ambulatory. I suspect pain is coming from his lower back with a radicular component to his left leg. Plain films of lumbar spine shows no acute findings. Discharge medications Percocet and Flexeril 10 mg. Follow up with neurosurgery.  I personally performed the services described in this documentation, which was scribed in my presence. The recorded information has been reviewed and is accurate.     Nat Christen, MD 08/09/13 1758

## 2013-08-11 ENCOUNTER — Emergency Department (HOSPITAL_COMMUNITY): Payer: Medicare Other

## 2013-08-11 ENCOUNTER — Emergency Department (HOSPITAL_COMMUNITY)
Admission: EM | Admit: 2013-08-11 | Discharge: 2013-08-11 | Disposition: A | Discharge status: Home / Self Care | Payer: Medicare Other | Attending: Emergency Medicine | Admitting: Emergency Medicine

## 2013-08-11 ENCOUNTER — Encounter (HOSPITAL_COMMUNITY): Payer: Self-pay | Admitting: Emergency Medicine

## 2013-08-11 DIAGNOSIS — M25552 Pain in left hip: Secondary | ICD-10-CM

## 2013-08-11 DIAGNOSIS — Z8546 Personal history of malignant neoplasm of prostate: Secondary | ICD-10-CM | POA: Diagnosis not present

## 2013-08-11 DIAGNOSIS — Z7901 Long term (current) use of anticoagulants: Secondary | ICD-10-CM | POA: Insufficient documentation

## 2013-08-11 DIAGNOSIS — E11319 Type 2 diabetes mellitus with unspecified diabetic retinopathy without macular edema: Secondary | ICD-10-CM | POA: Insufficient documentation

## 2013-08-11 DIAGNOSIS — Z87891 Personal history of nicotine dependence: Secondary | ICD-10-CM | POA: Diagnosis not present

## 2013-08-11 DIAGNOSIS — R5381 Other malaise: Secondary | ICD-10-CM | POA: Diagnosis not present

## 2013-08-11 DIAGNOSIS — R5383 Other fatigue: Secondary | ICD-10-CM

## 2013-08-11 DIAGNOSIS — E1139 Type 2 diabetes mellitus with other diabetic ophthalmic complication: Secondary | ICD-10-CM | POA: Diagnosis not present

## 2013-08-11 DIAGNOSIS — I10 Essential (primary) hypertension: Secondary | ICD-10-CM | POA: Diagnosis not present

## 2013-08-11 DIAGNOSIS — M5430 Sciatica, unspecified side: Secondary | ICD-10-CM | POA: Diagnosis not present

## 2013-08-11 DIAGNOSIS — Z87442 Personal history of urinary calculi: Secondary | ICD-10-CM | POA: Diagnosis not present

## 2013-08-11 DIAGNOSIS — Z794 Long term (current) use of insulin: Secondary | ICD-10-CM | POA: Diagnosis not present

## 2013-08-11 DIAGNOSIS — M1610 Unilateral primary osteoarthritis, unspecified hip: Secondary | ICD-10-CM | POA: Diagnosis not present

## 2013-08-11 DIAGNOSIS — R209 Unspecified disturbances of skin sensation: Secondary | ICD-10-CM | POA: Diagnosis not present

## 2013-08-11 DIAGNOSIS — Z9889 Other specified postprocedural states: Secondary | ICD-10-CM | POA: Diagnosis not present

## 2013-08-11 DIAGNOSIS — M25569 Pain in unspecified knee: Secondary | ICD-10-CM | POA: Insufficient documentation

## 2013-08-11 DIAGNOSIS — M169 Osteoarthritis of hip, unspecified: Secondary | ICD-10-CM | POA: Diagnosis not present

## 2013-08-11 DIAGNOSIS — M25559 Pain in unspecified hip: Secondary | ICD-10-CM | POA: Diagnosis not present

## 2013-08-11 DIAGNOSIS — IMO0002 Reserved for concepts with insufficient information to code with codable children: Secondary | ICD-10-CM | POA: Insufficient documentation

## 2013-08-11 MED ORDER — OXYCODONE-ACETAMINOPHEN 5-325 MG PO TABS: 1.0000 | ORAL_TABLET | Freq: Three times a day (TID) | ORAL | Status: DC | PRN

## 2013-08-11 MED ORDER — OXYCODONE-ACETAMINOPHEN 5-325 MG PO TABS
1.0000 | ORAL_TABLET | Freq: Once | ORAL | Status: AC
  Administered 2013-08-11: 1 via ORAL
  Filled 2013-08-11: qty 1

## 2013-08-11 NOTE — ED Notes (Signed)
Patient placed in lobby.  Friend has been called per the chart

## 2013-08-11 NOTE — ED Notes (Signed)
Assisted pt with wheelchair to bathroom and back to room.

## 2013-08-11 NOTE — ED Notes (Signed)
Gave pt. A happymeal with water

## 2013-08-11 NOTE — ED Notes (Signed)
Pt started having pain in his left hip all the way down his leg on Friday. Pt went to [facility] on Saturday- pt was told his pain was sciatica. Pt was given oxycodone for pain- states it didn't really help. Pt having trouble walking and very uncomfortable.

## 2013-08-11 NOTE — ED Provider Notes (Signed)
CSN: [id number]     Arrival date & time 08/11/13  0272 History   First MD Initiated Contact with Patient 08/11/13 0802     Chief Complaint  Patient presents with  . Leg Pain     (Consider location/radiation/quality/duration/timing/severity/associated sxs/prior Treatment) The history is provided by the patient. No language interpreter was used.  Jonathan Hensley is a 73 y/o M with PMhx of DM, HTN, nephrolithiasis, DM radiculopathy, bulgling lumbar discs with a L4-5 lumbar laminectomy and microdiskectomy and bilateral L4-5 lumbar decopression (performed by [name] in 2010) presenting to the ED with left hip pain with radiation down the left leg that started on Friday morning sporadically. Patient reported that the pain is a dull, aching sensation to the left hip with radiation down the posterior aspect of the left leg only with motion. Patient reported that he has been having mild numbness to the left foot more so than normal. Patient reported that the pain is worse when moving the leg and applying pressure while walking. Stated that he was seen and assessed in the ED setting by [name] on Saturday, 08/10/2013 where he was given Percocets for pain control - reported minimal relief. Denied fall, injuries, urinary and bowel incontinence, chest pain, shortness of breath, difficulty breathing, weakness. PCP [name]  Past Medical History  Diagnosis Date  . History of prostate cancer   . Hypertension   . Diabetes mellitus   . Nephrolithiasis   . Diabetic retinopathy   . Bulging lumbar disc    Past Surgical History  Procedure Laterality Date  . Back surgery    . Brachytherapy    . Cystoscopy    . Left l4-l5 lumbar laminotomy and microdiskectomy with  07/14/2008    . Scleral buckle, laser photocoagulation, and anterior chamber  01/04/2009  . Bilateral l4-5 lumbar decompression including bilateral  11/2008   Family History  Problem Relation Age of Onset  . Heart failure Father   . Heart  failure Mother   . Heart failure Brother     Half brother.   History  Substance Use Topics  . Smoking status: Former Smoker    Quit date: 07/12/1972  . Smokeless tobacco: Not on file  . Alcohol Use: No    Review of Systems  Constitutional: Negative for fever and chills.  Respiratory: Negative for chest tightness and shortness of breath.   Cardiovascular: Negative for chest pain.  Gastrointestinal: Negative for nausea, vomiting and abdominal pain.  Genitourinary: Negative for decreased urine volume.  Musculoskeletal: Positive for arthralgias (left hip) and back pain.  Neurological: Positive for weakness and numbness. Negative for dizziness.  All other systems reviewed and are negative.      Allergies  Other  Home Medications   Current Outpatient Rx  Name  Route  Sig  Dispense  Refill  . acetaminophen (TYLENOL) 500 MG tablet   Oral   Take 500 mg by mouth every 8 (eight) hours as needed. pain         . apixaban (ELIQUIS) 5 MG TABS tablet   Oral   Take 5 mg by mouth 2 (two) times daily.         . benazepril (LOTENSIN) 40 MG tablet   Oral   Take 40 mg by mouth at bedtime.          . cyclobenzaprine (FLEXERIL) 10 MG tablet   Oral   Take 1 tablet (10 mg total) by mouth 3 (three) times daily as needed for muscle  spasms.   30 tablet   0   . ezetimibe (ZETIA) 10 MG tablet   Oral   Take 10 mg by mouth daily.         . fluticasone (CUTIVATE) 0.05 % cream   Topical   Apply topically 2 (two) times daily.   60 g   3   . insulin aspart (NOVOLOG) 100 UNIT/ML injection   Subcutaneous   Inject 20 Units into the skin once. Use with V-Go pump   3 vial   3   . ketoconazole (NIZORAL) 2 % cream   Topical   Apply 1 application topically 2 (two) times daily.   60 g   3   . metFORMIN (GLUCOPHAGE) 1000 MG tablet   Oral   Take 1 tablet (1,000 mg total) by mouth 2 (two) times daily with a meal.   180 tablet   3   . oxyCODONE-acetaminophen (PERCOCET) 5-325 MG  per tablet   Oral   Take 2 tablets by mouth every 4 (four) hours as needed.   20 tablet   0   . simvastatin (ZOCOR) 20 MG tablet   Oral   Take 20 mg by mouth daily.         Marland Kitchen oxyCODONE-acetaminophen (PERCOCET/ROXICET) 5-325 MG per tablet   Oral   Take 1 tablet by mouth every 8 (eight) hours as needed for severe pain.   11 tablet   0    BP 140/70  Pulse 59  Temp(Src) 97.8 F (36.6 C) (Oral)  Resp 16  Ht 5\' 10"  (1.778 m)  Wt 221 lb (100.245 kg)  BMI 31.71 kg/m2  SpO2 100% Physical Exam  Nursing note and vitals reviewed. Constitutional: He is oriented to person, place, and time. He appears well-developed and well-nourished. No distress.  HENT:  Head: Normocephalic and atraumatic.  Mouth/Throat: Oropharynx is clear and moist. No oropharyngeal exudate.  Eyes: Conjunctivae and EOM are normal. Pupils are equal, round, and reactive to light. Right eye exhibits no discharge. Left eye exhibits no discharge.  Neck: Normal range of motion. Neck supple. No tracheal deviation present.  Cardiovascular: Normal rate, regular rhythm and normal heart sounds.   No murmur heard. Pulses:      Radial pulses are 2+ on the right side, and 2+ on the left side.       Dorsalis pedis pulses are 2+ on the right side, and 2+ on the left side.       Posterior tibial pulses are 2+ on the right side, and 2+ on the left side.  Pulmonary/Chest: Effort normal and breath sounds normal. No respiratory distress. He has no wheezes. He has no rales.  Musculoskeletal: Normal range of motion.  Negative swelling, erythema, inflammation, lesions, sores, deformities noted to the left hip and pelvic region. Negative erythema warmth upon palpation. Full range of motion noted to left hip-full flexion, extension, abduction, adduction-mild discomfort upon flexion noted. Full range of motion to left knee, left ankle and digits of left foot without complications.  Lymphadenopathy:    He has no cervical adenopathy.   Neurological: He is alert and oriented to person, place, and time. No cranial nerve deficit. He exhibits normal muscle tone. Coordination normal.  Cranial nerves III-XII grossly intact Strength 5+/5+ to upper and lower extremities bilaterally with resistance applied, equal distribution noted Sensation intact with differentiation to sharp and dull touch Gait proper, proper balance - negative sway, negative drift, negative step-offs. Mild limp when walking secondary to pain with applying  pressure to the left leg.   Skin: Skin is warm and dry. No rash noted. He is not diaphoretic. No erythema.  Psychiatric: He has a normal mood and affect. His behavior is normal. Thought content normal.    ED Course  Procedures (including critical care time)  11:17 AM Patient seen and assessed by attending physician who cleared patient for discharge. Recommended Percocets to be given.   Labs Review Labs Reviewed - No data to display Imaging Review Dg Hip Bilateral W/pelvis  08/11/2013   CLINICAL DATA:  Lower extremity pain.  EXAM: BILATERAL HIP WITH PELVIS - 4+ VIEW  COMPARISON:  CT ABD/PELVIS W CM dated 12/19/2011; DG PELVIS PORTABLE dated 12/19/2011; NM BONE WHOLE BODY dated 11/09/2010  FINDINGS: The bony pelvis shows a stable appearance without evidence of fracture or lesion. Brachytherapy seeds are again identified in the prostate gland. One of the seeds shows slight superior migration just above the right side of the pubic symphysis.  The right hip joint shows mild osteoarthritis. No fracture or dislocation is identified. No focal bony lesions or soft tissue abnormalities are seen.  The left hip shows mild osteoarthritis. No fracture or dislocation is identified. No focal bony lesion or soft tissue abnormality is seen.  IMPRESSION: Unremarkable bony pelvis and bilateral hip films showing no acute abnormalities. No bony lesions are identified. Mild osteoarthritis present of both hips.   Electronically Signed   By:  Aletta Edouard M.D.   On: 08/11/2013 10:05     EKG Interpretation None      MDM   Final diagnoses:  Left hip pain  Sciatica   Medications  oxyCODONE-acetaminophen (PERCOCET/ROXICET) 5-325 MG per tablet 1 tablet (1 tablet Oral Given 08/11/13 1002)   Filed Vitals:   08/11/13 0759 08/11/13 0900 08/11/13 1024 08/11/13 1148  BP: 168/79 155/67 144/82 140/70  Pulse:  61 59   Temp: 97.6 F (36.4 C)  98.1 F (36.7 C) 97.8 F (36.6 C)  TempSrc: Oral  Oral Oral  Resp: 16  19 16   Height: 5\' 10"  (1.778 m)     Weight: 221 lb (100.245 kg)     SpO2: 95% 98% 98% 100%    Patient presenting to the ED with left hip pain described as a dull aching sensation with radiation down the posterior aspect of the left leg described as a sharp shooting pain worse with motion and applying pressure while walking. Patient has history of back surgery performed in 2010 by Dr. Kathaleen Maser lumbar laminectomy and microdiscectomy. Patient was seen and assessed in ED setting on Saturday, 08/09/2013 for Percocets were administered upon discharge. Patient reports the pain is consistent and has not changed. This provider reviewed patient's chart. Patient seen and assessed in ED setting on Saturday, 08/09/2013 where a lumbar spine plain film was performed with prior posterior fusion of L4-5 identified with negative acute osseous injuries. Patient was given Percocet for pain control and discharged home with recommendation for followup with [name]. Alert and oriented. GCS 15. Heart rate and rhythm normal. Lungs clear to auscultation to upper and lower lobes bilaterally. Radial and DP pulses as well as PT pulses 2+. Cap refill less than 3 seconds. Negative deformities, erythema, inflammation, warmth noted to the left hip. Full range of motion noted to the left hip with mild discomfort upon flexion. Mild discomfort upon palpation to the left buttocks as well as posterior aspect of the left thigh, posterior aspect of the  left knee, calf. Full range of motion to left  knee, left ankle and digits of left foot without complications. Strength intact with equal distribution noted-with resistance applied. Sensation intact with differentiation sharp and dull touch. Patient able to bear weight to the left leg without difficulty-discomfort and limp noted when walking. Plain film of bilateral hip and pelvis with negative acute osseous injury identified-mild osteoporosis present bilateral hips. Negative focal neurological deficits noted. Doubt cauda equina. Doubt epidural abscess. Suspicion to be lumbar radiculopathy, sciatica. Patient stable, afebrile. Patient able to walk, but with pain secondary to radicular discomfort. Patient seen and assessed by attending physician who cleared patient for discharge. Patient stable, afebrile. Discharged patient. Discussed with patient to rest and stay hydrated. Discussed with patient to avoid any physical or strenuous activity. Discussed with patient to call and set-up an appointment with [name] to be re-assessed, recommended to call today. Discharged patient with pain medications, discussed with patient course, precautions, disposal technique. Discussed with patient to closely monitor symptoms and if symptoms are to worsen or change to report back to the ED - strict return instructions given.  Patient agreed to plan of care, understood, all questions answered.   Jamse Mead, PA-C 08/11/13 1759

## 2013-08-11 NOTE — Discharge Instructions (Signed)
Please call your doctor for a followup appointment within 24-48 hours. When you talk to your doctor please let them know that you were seen in the emergency department and have them acquire all of your records so that they can discuss the findings with you and formulate a treatment plan to fully care for your new and ongoing problems. Please call and set-up an appointment with [name], please call his office today Please rest and stay hydrated Please avoid any physical or strenuous activity Please avoid any heavy lifting Please take pain medications as prescribed - while on pain medications there is to be no drinking alcohol, driving, operating any heavy machinery - if there is extra please dispose in a proper manner. Please do not take any extra Tylenol for this can lead to Tylenol overdose and liver issues.  Please continue to monitor symptoms closely and if symptoms are to worsen or change (fever greater than 101, chills, nausea, vomiting, chest pain, short of breath, difficulty breathing, abdominal pain, nausea, vomiting, inability to control urine and bowel movements, fall, injury, complete loss of sensation to the lower extremity) please report back to the ED immediately  Sciatica Sciatica is pain, weakness, numbness, or tingling along the path of the sciatic nerve. The nerve starts in the lower back and runs down the back of each leg. The nerve controls the muscles in the lower leg and in the back of the knee, while also providing sensation to the back of the thigh, lower leg, and the sole of your foot. Sciatica is a symptom of another medical condition. For instance, nerve damage or certain conditions, such as a herniated disk or bone spur on the spine, pinch or put pressure on the sciatic nerve. This causes the pain, weakness, or other sensations normally associated with sciatica. Generally, sciatica only affects one side of the body. CAUSES   Herniated or slipped disc.  Degenerative disk  disease.  A pain disorder involving the narrow muscle in the buttocks (piriformis syndrome).  Pelvic injury or fracture.  Pregnancy.  Tumor (rare). SYMPTOMS  Symptoms can vary from mild to very severe. The symptoms usually travel from the low back to the buttocks and down the back of the leg. Symptoms can include:  Mild tingling or dull aches in the lower back, leg, or hip.  Numbness in the back of the calf or sole of the foot.  Burning sensations in the lower back, leg, or hip.  Sharp pains in the lower back, leg, or hip.  Leg weakness.  Severe back pain inhibiting movement. These symptoms may get worse with coughing, sneezing, laughing, or prolonged sitting or standing. Also, being overweight may worsen symptoms. DIAGNOSIS  Your caregiver will perform a physical exam to look for common symptoms of sciatica. He or she may ask you to do certain movements or activities that would trigger sciatic nerve pain. Other tests may be performed to find the cause of the sciatica. These may include:  Blood tests.  X-rays.  Imaging tests, such as an MRI or CT scan. TREATMENT  Treatment is directed at the cause of the sciatic pain. Sometimes, treatment is not necessary and the pain and discomfort goes away on its own. If treatment is needed, your caregiver may suggest:  Over-the-counter medicines to relieve pain.  Prescription medicines, such as anti-inflammatory medicine, muscle relaxants, or narcotics.  Applying heat or ice to the painful area.  Steroid injections to lessen pain, irritation, and inflammation around the nerve.  Reducing activity  during periods of pain.  Exercising and stretching to strengthen your abdomen and improve flexibility of your spine. Your caregiver may suggest losing weight if the extra weight makes the back pain worse.  Physical therapy.  Surgery to eliminate what is pressing or pinching the nerve, such as a bone spur or part of a herniated disk. HOME  CARE INSTRUCTIONS   Only take over-the-counter or prescription medicines for pain or discomfort as directed by your caregiver.  Apply ice to the affected area for 20 minutes, 3 4 times a day for the first 48 72 hours. Then try heat in the same way.  Exercise, stretch, or perform your usual activities if these do not aggravate your pain.  Attend physical therapy sessions as directed by your caregiver.  Keep all follow-up appointments as directed by your caregiver.  Do not wear high heels or shoes that do not provide proper support.  Check your mattress to see if it is too soft. A firm mattress may lessen your pain and discomfort. SEEK IMMEDIATE MEDICAL CARE IF:   You lose control of your bowel or bladder (incontinence).  You have increasing weakness in the lower back, pelvis, buttocks, or legs.  You have redness or swelling of your back.  You have a burning sensation when you urinate.  You have pain that gets worse when you lie down or awakens you at night.  Your pain is worse than you have experienced in the past.  Your pain is lasting longer than 4 weeks.  You are suddenly losing weight without reason. MAKE SURE YOU:  Understand these instructions.  Will watch your condition.  Will get help right away if you are not doing well or get worse. Document Released: 04/25/2001 Document Revised: 10/31/2011 Document Reviewed: 09/10/2011 [facility] Patient Information 2014 [place].   Emergency Department Resource Guide 1) Find a Doctor and Pay Out of Pocket Although you won't have to find out who is covered by your insurance plan, it is a good idea to ask around and get recommendations. You will then need to call the office and see if the doctor you have chosen will accept you as a new patient and what types of options they offer for patients who are self-pay. Some doctors offer discounts or will set up payment plans for their patients who do not have insurance, but you will  need to ask so you aren't surprised when you get to your appointment.  2) Contact Your Local Health Department Not all health departments have doctors that can see patients for sick visits, but many do, so it is worth a call to see if yours does. If you don't know where your local health department is, you can check in your phone book. The CDC also has a tool to help you locate your state's health department, and many state websites also have listings of all of their local health departments.  3) Find a [facility] If your illness is not likely to be very severe or complicated, you may want to try a walk in clinic. These are popping up all over the country in pharmacies, drugstores, and shopping centers. They're usually staffed by nurse practitioners or physician assistants that have been trained to treat common illnesses and complaints. They're usually fairly quick and inexpensive. However, if you have serious medical issues or chronic medical problems, these are probably not your best option.  No Primary Care Doctor: - Call Health Connect at  [phone] - they can help you  locate a primary care doctor that  accepts your insurance, provides certain services, etc. - Physician Referral Service- [phone]  Chronic Pain Problems: Organization         Address  Phone   Notes  [facility]  [phone] Patients need to be referred by their primary care doctor.   Medication Assistance: Organization         Address  Phone   Notes  [facility] Medication [facility] [address] [place], [place] [address] [phone] --Must be a resident of [facility] -- Must have NO insurance coverage whatsoever (no Medicaid/ Medicare, etc.) -- The pt. MUST have a primary care doctor that directs their care regularly and follows them in the community   MedAssist  [phone]   [employer]  [phone]    Agencies that provide inexpensive medical  care: Organization         Address  Phone   Notes  [place]  [phone]   [facility]    [phone]   [facility] [place], [place] [address] [phone]   [place] [address] [place] [phone]   Planned Parenthood    [phone]   [facility]    [phone]   [place] and [place] [address], [place] Phone:  [phone], Fax:  [phone] Hours of Operation:  9 am - 6 pm, M-F.  Also accepts Medicaid/Medicare and self-pay.  [facility] for [place], [address], [place] Phone: [phone], Fax: [phone]. Hours of Operation:  8:30 am - 5:30 pm, M-F.  Also accepts Medicaid and self-pay.  [facility] [place] [address] [place] Phone: [phone]   [place], [place], [place] [phone], Ext. [phone] Mondays & Thursdays: 7-9 AM.  First 15 patients are seen on a first come, first serve basis.    [place] Providers:  Organization         Address  Phone   Notes  [facility] [address], [place] [phone] Also accepts self-pay patients.  [facility] [id number] [place], [place]  [phone]   [place], [address], [place] [phone]   [facility] [address], [place] [phone]   Lucianne Lei [address], [place]   [phone] Only accepts [place] Access [place] patients after they have their name applied to their card.   Self-Pay (no insurance) in [facility]:  Organization         Address  Phone   Notes  Sickle Cell Patients, [facility] Internal Medicine [place] [phone]   [facility]gent Care [place] [phone]   [facility] [place]  [place], [place], [place] [phone]   [facility]/Dr. Osei-Bonsu  [address], [place] or [address], [place] [phone] Phone number for both [place] and [place] locations is the same.  Urgent Medical and [facility] [address], [place] [phone]   [facility] [address] [place] or [address] [phone])  [phone]   [place] [phone], phone; [phone], fax Sees patients 1st and 3rd Saturday of every month.  Must not qualify for public or private insurance (i.e. Medicaid, Medicare, [place] Health Choice, Veterans' Benefits)  Household income should be no more than 200% of the poverty level The clinic cannot treat you if you are pregnant or think you are pregnant  Sexually transmitted diseases are not treated at the clinic.    Dental Care: Organization         Address  Phone  Notes  [facility] [place] [phone] Accepts children up to age 32 who are enrolled in [place] or [place]; pregnant women with a Medicaid card; and children who have applied for Medicaid or [place] Health Choice, but were declined, whose parents can pay a reduced fee at time of service.  [facility] Department of [facility]  [address], [place] [phone] Accepts children up to age 75 who are enrolled in [place] or [place]; pregnant women with a Medicaid card; and children who have applied for Medicaid or [place] Health Choice, but were declined, whose parents can pay a reduced fee at time of service.  [facility] Adult [facility] Access PROGRAM  [place] [phone] Patients are seen by appointment only. Walk-ins are not accepted. [place] will see patients 70 years of age and older. Monday - Tuesday (8am-5pm) Most Wednesdays (8:30-5pm) $30 per visit, cash only  [facility] Adult Dental Access PROGRAM  [address], [facility] [phone] Patients are seen by appointment only. Walk-ins are not accepted. [place] will see patients 28 years of age and older. One Wednesday Evening (Monthly: Volunteer Based).  $30 per visit, cash only  [place]  [phone] for adults; Children under age 55, call Graduate Pediatric Dentistry at [phone]. Children aged 37-14, please call [phone] to request a pediatric application.  Dental services are provided in all areas of dental care including fillings, crowns and bridges, complete and partial dentures, implants, gum treatment, root canals, and extractions. Preventive care is also provided. Treatment is provided to both adults and children. Patients are selected via a lottery and there is often a waiting list.   [facility] [address] [place]  [phone] [email or web address]   [facility] [address] [place], [place] [phone], Ext. [phone] Second and Fourth Thursday of each month, opens at 6:30 AM; Clinic ends at 9 AM.  Patients are seen on a first-come first-served basis, and a limited number are seen during each clinic.   [facility]  [address] [place]ard Danker [place], [place] [phone]   Eligibility Requirements You must have lived in [place], [place], or [place] counties for at least the last three months.   You cannot be eligible for state or federal sponsored [employer], including [employer], [place], or [employer].   You generally cannot be eligible for healthcare insurance through your employer.    How to apply: Eligibility screenings are held every Tuesday and Wednesday afternoon from 1:00 pm until 4:00 pm. You do not need an appointment for the interview!   [facility] [address] [place], [place]   [place]  [phone]   [place] Department  [phone]  [place] in the Community: Intensive Outpatient Programs Organization         Address  Phone  Notes  [place]. [address] [place], [place] [phone]   [facility] [address] [place], [place]   ADS: Alcohol & Drug Svcs [address], [place], [place]   [place] [address]  [place], [place] or [phone]   Substance Abuse Resources Organization         Address  Phone  Notes  Alcohol and Drug Services  [phone]   [place]  [phone]   [facility] [place]   Chinita Pester  [phone]   Residential & Outpatient Substance Abuse Program  [phone]   Psychological Services Organization         Address  Phone  Notes  [facility] [place]  [place]  [phone]   [place] [address] [place] or [phone]    Mobile Crisis Teams Organization         Address  Phone  Notes  [facility], Mobile Crisis Care Unit  [phone]   [facility]otherapeutic Services  [address] [place], [place]   [place] [address], [place] [phone]    Self-Help/Support Groups Organization         Address  Phone             Notes  [place]. of [place] - variety of support groups  [place] Call for more information  Narcotics Anonymous (NA), [facility] [address], [employer] [place]  2 meetings at this location   Special educational needs teacher         Address  Phone  Notes  [facility]AP Residential Treatment [place],    [place]  [phone]   [facility]  [address], [place] [address]47654, [place], [place]   [place], [place] [phone] Admissions: 8am-3pm M-F  Incentives Substance [place] [address]    [place], [place] [phone]   [facility] [address] [place], [place], [place]   [facility] [address],  [place], [place]   [facility] - Intensive Outpatient [address], [place], [place]   [facility].,  [place], [place] [phone] or [phone]   [facility] Services (RTS) [address] [place], [place] Accepts Medicaid  [facility] [place] [address],  [place] [phone] Substance Abuse/Addiction Treatment   [facility] Organization         Address  Phone  Notes  [facility]  [phone]   Domenic Schwab, PhD [address] [place], [place]   [phone] or [phone]   [place], [place] [phone]   [place] [address], [place], [place] [phone] Insurance/Medicaid/sponsorship through [employer] and Families [address] [place]                                    [name], [place] [phone] Therapy/tele-psych/case  [facility] [place], [place] [phone]    Dr. Adele Schilder  [phone]   [facility] Dept. 1) [address] [place] 2) [place] 3)  [address], [place] [phone]  [phone]   [place] [phone] or [phone] (After Hours)

## 2013-08-11 NOTE — ED Notes (Signed)
Spoke with pt's neighbor who is coming to drive pt home.

## 2013-08-12 MED FILL — Oxycodone w/ Acetaminophen Tab 5-325 MG: ORAL | Qty: 6 | Status: AC

## 2013-08-13 ENCOUNTER — Encounter: Payer: Self-pay | Admitting: Endocrinology

## 2013-08-13 ENCOUNTER — Ambulatory Visit
Admission: RE | Admit: 2013-08-13 | Discharge: 2013-08-13 | Disposition: A | Discharge status: Home / Self Care | Payer: Medicare Other | Source: Ambulatory Visit | Attending: Neurosurgery | Admitting: Neurosurgery

## 2013-08-13 ENCOUNTER — Other Ambulatory Visit: Payer: Self-pay | Admitting: Neurosurgery

## 2013-08-13 DIAGNOSIS — M545 Low back pain, unspecified: Secondary | ICD-10-CM

## 2013-08-13 DIAGNOSIS — M5410 Radiculopathy, site unspecified: Secondary | ICD-10-CM

## 2013-08-13 DIAGNOSIS — R531 Weakness: Secondary | ICD-10-CM

## 2013-08-13 DIAGNOSIS — Z6830 Body mass index (BMI) 30.0-30.9, adult: Secondary | ICD-10-CM | POA: Diagnosis not present

## 2013-08-13 DIAGNOSIS — M47817 Spondylosis without myelopathy or radiculopathy, lumbosacral region: Secondary | ICD-10-CM | POA: Diagnosis not present

## 2013-08-13 DIAGNOSIS — M5126 Other intervertebral disc displacement, lumbar region: Secondary | ICD-10-CM | POA: Diagnosis not present

## 2013-08-13 DIAGNOSIS — R030 Elevated blood-pressure reading, without diagnosis of hypertension: Secondary | ICD-10-CM | POA: Diagnosis not present

## 2013-08-13 MED ORDER — GADOBENATE DIMEGLUMINE 529 MG/ML IV SOLN
20.0000 mL | Freq: Once | INTRAVENOUS | Status: AC | PRN
  Administered 2013-08-13: 20 mL via INTRAVENOUS

## 2013-08-13 NOTE — ED Provider Notes (Signed)
Medical screening examination/treatment/procedure(s) were conducted as a shared visit with non-physician practitioner(s) and myself.  I personally evaluated the patient during the encounter.   EKG Interpretation None      Patient with signs/symptoms c/w sciatica. Neurologic exam normal, including gait. Will treat with pain control and recommend he call his neurosurgeon after discharge to help set up outpatient follow up. No indication for MRI or emergent consult at this time. Pain seems controlled currently, eating lunch and no distress.  Ephraim Hamburger, MD 08/13/13 1345

## 2013-08-15 ENCOUNTER — Encounter (HOSPITAL_COMMUNITY): Payer: Self-pay | Admitting: *Deleted

## 2013-08-15 NOTE — Progress Notes (Signed)
Called pt for pre-op call and he requested that I call his wife and talk with her, he was hurting too bad at the time.

## 2013-08-15 NOTE — Progress Notes (Signed)
Pt has an insulin pump. Suanne Marker, diabetic coordinator has been notified.   Pt's wife will not be coming with patient prior to surgery, but will be coming a little later. A friend, Fransisco Hertz will be bringing pt and staying until wife gets here.

## 2013-08-17 MED ORDER — CEFAZOLIN SODIUM-DEXTROSE 2-3 GM-% IV SOLR
2.0000 g | INTRAVENOUS | Status: AC
  Administered 2013-08-18: 2 g via INTRAVENOUS
  Filled 2013-08-17: qty 50

## 2013-08-18 ENCOUNTER — Ambulatory Visit (HOSPITAL_COMMUNITY): Payer: Medicare Other | Admitting: Anesthesiology

## 2013-08-18 ENCOUNTER — Ambulatory Visit (HOSPITAL_COMMUNITY): Payer: Medicare Other

## 2013-08-18 ENCOUNTER — Encounter (HOSPITAL_COMMUNITY): Payer: Self-pay | Admitting: Anesthesiology

## 2013-08-18 ENCOUNTER — Encounter (HOSPITAL_COMMUNITY): Payer: Medicare Other | Admitting: Anesthesiology

## 2013-08-18 ENCOUNTER — Encounter (HOSPITAL_COMMUNITY)
Admission: RE | Disposition: A | Discharge status: Home / Self Care | Payer: Self-pay | Source: Ambulatory Visit | Attending: Neurosurgery

## 2013-08-18 ENCOUNTER — Observation Stay (HOSPITAL_COMMUNITY)
Admission: RE | Admit: 2013-08-18 | Discharge: 2013-08-19 | Disposition: A | Discharge status: Home / Self Care | Payer: Medicare Other | Source: Ambulatory Visit | Attending: Neurosurgery | Admitting: Neurosurgery

## 2013-08-18 DIAGNOSIS — G4730 Sleep apnea, unspecified: Secondary | ICD-10-CM | POA: Insufficient documentation

## 2013-08-18 DIAGNOSIS — Z8546 Personal history of malignant neoplasm of prostate: Secondary | ICD-10-CM | POA: Diagnosis not present

## 2013-08-18 DIAGNOSIS — Z794 Long term (current) use of insulin: Secondary | ICD-10-CM | POA: Insufficient documentation

## 2013-08-18 DIAGNOSIS — D649 Anemia, unspecified: Secondary | ICD-10-CM | POA: Insufficient documentation

## 2013-08-18 DIAGNOSIS — E11319 Type 2 diabetes mellitus with unspecified diabetic retinopathy without macular edema: Secondary | ICD-10-CM | POA: Insufficient documentation

## 2013-08-18 DIAGNOSIS — I10 Essential (primary) hypertension: Secondary | ICD-10-CM | POA: Insufficient documentation

## 2013-08-18 DIAGNOSIS — M069 Rheumatoid arthritis, unspecified: Secondary | ICD-10-CM | POA: Diagnosis not present

## 2013-08-18 DIAGNOSIS — M5137 Other intervertebral disc degeneration, lumbosacral region: Secondary | ICD-10-CM | POA: Insufficient documentation

## 2013-08-18 DIAGNOSIS — Z01818 Encounter for other preprocedural examination: Secondary | ICD-10-CM | POA: Diagnosis not present

## 2013-08-18 DIAGNOSIS — E1139 Type 2 diabetes mellitus with other diabetic ophthalmic complication: Secondary | ICD-10-CM | POA: Insufficient documentation

## 2013-08-18 DIAGNOSIS — E785 Hyperlipidemia, unspecified: Secondary | ICD-10-CM | POA: Insufficient documentation

## 2013-08-18 DIAGNOSIS — M47817 Spondylosis without myelopathy or radiculopathy, lumbosacral region: Secondary | ICD-10-CM | POA: Insufficient documentation

## 2013-08-18 DIAGNOSIS — M5126 Other intervertebral disc displacement, lumbar region: Principal | ICD-10-CM | POA: Diagnosis present

## 2013-08-18 DIAGNOSIS — I4891 Unspecified atrial fibrillation: Secondary | ICD-10-CM | POA: Insufficient documentation

## 2013-08-18 DIAGNOSIS — Z7901 Long term (current) use of anticoagulants: Secondary | ICD-10-CM | POA: Diagnosis not present

## 2013-08-18 DIAGNOSIS — M51379 Other intervertebral disc degeneration, lumbosacral region without mention of lumbar back pain or lower extremity pain: Secondary | ICD-10-CM | POA: Insufficient documentation

## 2013-08-18 DIAGNOSIS — Z87891 Personal history of nicotine dependence: Secondary | ICD-10-CM | POA: Insufficient documentation

## 2013-08-18 DIAGNOSIS — M519 Unspecified thoracic, thoracolumbar and lumbosacral intervertebral disc disorder: Secondary | ICD-10-CM | POA: Diagnosis not present

## 2013-08-18 DIAGNOSIS — IMO0002 Reserved for concepts with insufficient information to code with codable children: Secondary | ICD-10-CM | POA: Diagnosis not present

## 2013-08-18 HISTORY — DX: Cardiac arrhythmia, unspecified: I49.9

## 2013-08-18 HISTORY — DX: Sleep apnea, unspecified: G47.30

## 2013-08-18 HISTORY — DX: Unspecified osteoarthritis, unspecified site: M19.90

## 2013-08-18 HISTORY — PX: LUMBAR LAMINECTOMY/DECOMPRESSION MICRODISCECTOMY: SHX5026

## 2013-08-18 LAB — CBC
HCT: 39.7 % (ref 39.0–52.0)
Hemoglobin: 13.6 g/dL (ref 13.0–17.0)
MCH: 31 pg (ref 26.0–34.0)
MCHC: 34.3 g/dL (ref 30.0–36.0)
MCV: 90.4 fL (ref 78.0–100.0)
Platelets: 203 10*3/uL (ref 150–400)
RBC: 4.39 MIL/uL (ref 4.22–5.81)
RDW: 12.4 % (ref 11.5–15.5)
WBC: 6.5 10*3/uL (ref 4.0–10.5)

## 2013-08-18 LAB — SURGICAL PCR SCREEN
MRSA, PCR: NEGATIVE
Staphylococcus aureus: NEGATIVE

## 2013-08-18 LAB — BASIC METABOLIC PANEL
BUN: 29 mg/dL — ABNORMAL HIGH (ref 6–23)
CHLORIDE: 100 meq/L (ref 96–112)
CO2: 26 meq/L (ref 19–32)
CREATININE: 1.13 mg/dL (ref 0.50–1.35)
Calcium: 9.5 mg/dL (ref 8.4–10.5)
GFR calc non Af Amer: 63 mL/min — ABNORMAL LOW (ref >90)
GFR, EST AFRICAN AMERICAN: 73 mL/min — AB (ref >90)
Glucose, Bld: 280 mg/dL — ABNORMAL HIGH (ref 70–99)
POTASSIUM: 4.6 meq/L (ref 3.7–5.3)
Sodium: 141 mEq/L (ref 137–147)

## 2013-08-18 LAB — GLUCOSE, CAPILLARY
GLUCOSE-CAPILLARY: 282 mg/dL — AB (ref 70–99)
GLUCOSE-CAPILLARY: 228 mg/dL — AB (ref 70–99)
GLUCOSE-CAPILLARY: 158 mg/dL — AB (ref 70–99)
Glucose-Capillary: 237 mg/dL — ABNORMAL HIGH (ref 70–99)
Glucose-Capillary: 164 mg/dL — ABNORMAL HIGH (ref 70–99)
Glucose-Capillary: 145 mg/dL — ABNORMAL HIGH (ref 70–99)

## 2013-08-18 LAB — POCT I-STAT GLUCOSE
Glucose, Bld: 250 mg/dL — ABNORMAL HIGH (ref 70–99)
Operator id: 133981

## 2013-08-18 SURGERY — LUMBAR LAMINECTOMY/DECOMPRESSION MICRODISCECTOMY 1 LEVEL
Anesthesia: General | Laterality: Left

## 2013-08-18 MED ORDER — ALUM & MAG HYDROXIDE-SIMETH 200-200-20 MG/5ML PO SUSP: 30.0000 mL | Freq: Four times a day (QID) | ORAL | Status: DC | PRN

## 2013-08-18 MED ORDER — INSULIN ASPART 100 UNIT/ML ~~LOC~~ SOLN: 0.0000 [IU] | Freq: Three times a day (TID) | SUBCUTANEOUS | Status: DC

## 2013-08-18 MED ORDER — MUPIROCIN 2 % EX OINT
TOPICAL_OINTMENT | Freq: Two times a day (BID) | CUTANEOUS | Status: DC
  Administered 2013-08-18: 1 via NASAL
  Filled 2013-08-18: qty 22

## 2013-08-18 MED ORDER — SODIUM CHLORIDE 0.9 % IJ SOLN: 3.0000 mL | INTRAMUSCULAR | Status: DC | PRN

## 2013-08-18 MED ORDER — INSULIN ASPART 100 UNIT/ML ~~LOC~~ SOLN: 20.0000 [IU] | Freq: Once | SUBCUTANEOUS | Status: DC

## 2013-08-18 MED ORDER — INSULIN ASPART 100 UNIT/ML ~~LOC~~ SOLN: 0.0000 [IU] | Freq: Every day | SUBCUTANEOUS | Status: DC

## 2013-08-18 MED ORDER — ONDANSETRON HCL 4 MG/2ML IJ SOLN
INTRAMUSCULAR | Status: AC
  Filled 2013-08-18: qty 2

## 2013-08-18 MED ORDER — KETOROLAC TROMETHAMINE 30 MG/ML IJ SOLN
30.0000 mg | Freq: Four times a day (QID) | INTRAMUSCULAR | Status: DC
  Administered 2013-08-18 – 2013-08-19 (×4): 30 mg via INTRAVENOUS
  Filled 2013-08-18 (×8): qty 1

## 2013-08-18 MED ORDER — PROPOFOL 10 MG/ML IV BOLUS
INTRAVENOUS | Status: DC | PRN
  Administered 2013-08-18: 200 mg via INTRAVENOUS

## 2013-08-18 MED ORDER — THROMBIN 5000 UNITS EX SOLR
CUTANEOUS | Status: DC | PRN
  Administered 2013-08-18 (×2): 5000 [IU] via TOPICAL

## 2013-08-18 MED ORDER — SIMVASTATIN 20 MG PO TABS
20.0000 mg | ORAL_TABLET | Freq: Every day | ORAL | Status: DC
  Administered 2013-08-18: 20 mg via ORAL
  Filled 2013-08-18 (×2): qty 1

## 2013-08-18 MED ORDER — ACETAMINOPHEN 325 MG PO TABS: 650.0000 mg | ORAL_TABLET | ORAL | Status: DC | PRN

## 2013-08-18 MED ORDER — BENAZEPRIL HCL 40 MG PO TABS
40.0000 mg | ORAL_TABLET | Freq: Every day | ORAL | Status: DC
  Administered 2013-08-18: 40 mg via ORAL
  Filled 2013-08-18 (×2): qty 1

## 2013-08-18 MED ORDER — LIDOCAINE HCL (CARDIAC) 20 MG/ML IV SOLN
INTRAVENOUS | Status: DC | PRN
  Administered 2013-08-18: 60 mg via INTRAVENOUS

## 2013-08-18 MED ORDER — 0.9 % SODIUM CHLORIDE (POUR BTL) OPTIME
TOPICAL | Status: DC | PRN
  Administered 2013-08-18: 1000 mL

## 2013-08-18 MED ORDER — EZETIMIBE 10 MG PO TABS
10.0000 mg | ORAL_TABLET | Freq: Every day | ORAL | Status: DC
  Administered 2013-08-18: 10 mg via ORAL
  Filled 2013-08-18 (×2): qty 1

## 2013-08-18 MED ORDER — PHENOL 1.4 % MT LIQD: 1.0000 | OROMUCOSAL | Status: DC | PRN

## 2013-08-18 MED ORDER — MORPHINE SULFATE 4 MG/ML IJ SOLN: 4.0000 mg | INTRAMUSCULAR | Status: DC | PRN

## 2013-08-18 MED ORDER — FENTANYL CITRATE 0.05 MG/ML IJ SOLN
INTRAMUSCULAR | Status: DC | PRN
  Administered 2013-08-18: 25 ug via INTRAVENOUS

## 2013-08-18 MED ORDER — SODIUM CHLORIDE 0.9 % IJ SOLN
3.0000 mL | Freq: Two times a day (BID) | INTRAMUSCULAR | Status: DC
  Administered 2013-08-18: 3 mL via INTRAVENOUS

## 2013-08-18 MED ORDER — FENTANYL CITRATE 0.05 MG/ML IJ SOLN
INTRAMUSCULAR | Status: AC
  Filled 2013-08-18: qty 2

## 2013-08-18 MED ORDER — OXYCODONE-ACETAMINOPHEN 5-325 MG PO TABS: 1.0000 | ORAL_TABLET | ORAL | Status: DC | PRN

## 2013-08-18 MED ORDER — ONDANSETRON HCL 4 MG/2ML IJ SOLN
INTRAMUSCULAR | Status: DC | PRN
  Administered 2013-08-18 (×2): 4 mg via INTRAVENOUS

## 2013-08-18 MED ORDER — LIDOCAINE-EPINEPHRINE 1 %-1:100000 IJ SOLN
INTRAMUSCULAR | Status: DC | PRN
  Administered 2013-08-18: 10 mL via INTRADERMAL

## 2013-08-18 MED ORDER — ROCURONIUM BROMIDE 100 MG/10ML IV SOLN
INTRAVENOUS | Status: DC | PRN
  Administered 2013-08-18: 40 mg via INTRAVENOUS

## 2013-08-18 MED ORDER — SODIUM CHLORIDE 0.9 % IV SOLN: 250.0000 mL | INTRAVENOUS | Status: DC

## 2013-08-18 MED ORDER — ONDANSETRON HCL 4 MG/2ML IJ SOLN: 4.0000 mg | Freq: Four times a day (QID) | INTRAMUSCULAR | Status: DC | PRN

## 2013-08-18 MED ORDER — SODIUM CHLORIDE 0.9 % IV SOLN
INTRAVENOUS | Status: DC
  Administered 2013-08-18: 6.6 [IU]/h via INTRAVENOUS
  Filled 2013-08-18: qty 1

## 2013-08-18 MED ORDER — SUCCINYLCHOLINE CHLORIDE 20 MG/ML IJ SOLN
INTRAMUSCULAR | Status: DC | PRN
  Administered 2013-08-18: 120 mg via INTRAVENOUS

## 2013-08-18 MED ORDER — MENTHOL 3 MG MT LOZG: 1.0000 | LOZENGE | OROMUCOSAL | Status: DC | PRN

## 2013-08-18 MED ORDER — ACETAMINOPHEN 650 MG RE SUPP
650.0000 mg | RECTAL | Status: DC | PRN
Start: 2013-08-18 — End: 2013-08-19

## 2013-08-18 MED ORDER — ONDANSETRON 8 MG/NS 50 ML IVPB
8.0000 mg | Freq: Four times a day (QID) | INTRAVENOUS | Status: DC | PRN
  Filled 2013-08-18: qty 8

## 2013-08-18 MED ORDER — BUPIVACAINE-EPINEPHRINE PF 0.5-1:200000 % IJ SOLN: INTRAMUSCULAR | Status: DC | PRN

## 2013-08-18 MED ORDER — FENTANYL CITRATE 0.05 MG/ML IJ SOLN
INTRAMUSCULAR | Status: DC | PRN
  Administered 2013-08-18: 100 ug via INTRAVENOUS
  Administered 2013-08-18: 75 ug via INTRAVENOUS
  Administered 2013-08-18: 100 ug via INTRAVENOUS
  Administered 2013-08-18: 75 ug via INTRAVENOUS

## 2013-08-18 MED ORDER — NEOSTIGMINE METHYLSULFATE 1 MG/ML IJ SOLN
INTRAMUSCULAR | Status: DC | PRN
  Administered 2013-08-18: 3 mg via INTRAVENOUS

## 2013-08-18 MED ORDER — SODIUM CHLORIDE 0.9 % IR SOLN
Status: DC | PRN
  Administered 2013-08-18: 10:00:00

## 2013-08-18 MED ORDER — SODIUM CHLORIDE 0.9 % IJ SOLN
INTRAMUSCULAR | Status: AC
  Filled 2013-08-18: qty 10

## 2013-08-18 MED ORDER — GLYCOPYRROLATE 0.2 MG/ML IJ SOLN
INTRAMUSCULAR | Status: DC | PRN
  Administered 2013-08-18: 0.4 mg via INTRAVENOUS

## 2013-08-18 MED ORDER — HYDROCODONE-ACETAMINOPHEN 5-325 MG PO TABS: 1.0000 | ORAL_TABLET | ORAL | Status: DC | PRN

## 2013-08-18 MED ORDER — BUPIVACAINE HCL (PF) 0.5 % IJ SOLN
INTRAMUSCULAR | Status: DC | PRN
  Administered 2013-08-18: 10 mL

## 2013-08-18 MED ORDER — HYDROXYZINE HCL 25 MG PO TABS: 50.0000 mg | ORAL_TABLET | ORAL | Status: DC | PRN

## 2013-08-18 MED ORDER — LACTATED RINGERS IV SOLN
INTRAVENOUS | Status: DC | PRN
  Administered 2013-08-18 (×2): via INTRAVENOUS

## 2013-08-18 MED ORDER — SUCCINYLCHOLINE CHLORIDE 20 MG/ML IJ SOLN
INTRAMUSCULAR | Status: AC
  Filled 2013-08-18: qty 1

## 2013-08-18 MED ORDER — KETOROLAC TROMETHAMINE 30 MG/ML IJ SOLN
30.0000 mg | Freq: Once | INTRAMUSCULAR | Status: DC
Start: 2013-08-18 — End: 2013-08-18

## 2013-08-18 MED ORDER — INSULIN PUMP
Freq: Three times a day (TID) | SUBCUTANEOUS | Status: DC
  Filled 2013-08-18: qty 1

## 2013-08-18 MED ORDER — PHENYLEPHRINE HCL 10 MG/ML IJ SOLN
INTRAMUSCULAR | Status: DC | PRN
  Administered 2013-08-18: 80 ug via INTRAVENOUS

## 2013-08-18 MED ORDER — METOCLOPRAMIDE HCL 5 MG/ML IJ SOLN
INTRAMUSCULAR | Status: AC
  Filled 2013-08-18: qty 2

## 2013-08-18 MED ORDER — METFORMIN HCL 500 MG PO TABS
1000.0000 mg | ORAL_TABLET | Freq: Two times a day (BID) | ORAL | Status: DC
  Administered 2013-08-18: 1000 mg via ORAL
  Filled 2013-08-18 (×4): qty 2

## 2013-08-18 MED ORDER — MIDAZOLAM HCL 2 MG/2ML IJ SOLN
INTRAMUSCULAR | Status: AC
  Filled 2013-08-18: qty 2

## 2013-08-18 MED ORDER — FENTANYL CITRATE 0.05 MG/ML IJ SOLN
INTRAMUSCULAR | Status: AC
  Filled 2013-08-18: qty 5

## 2013-08-18 MED ORDER — GLYCOPYRROLATE 0.2 MG/ML IJ SOLN
INTRAMUSCULAR | Status: AC
  Filled 2013-08-18: qty 2

## 2013-08-18 MED ORDER — NEOSTIGMINE METHYLSULFATE 1 MG/ML IJ SOLN
INTRAMUSCULAR | Status: AC
  Filled 2013-08-18: qty 10

## 2013-08-18 MED ORDER — CYCLOBENZAPRINE HCL 10 MG PO TABS: 10.0000 mg | ORAL_TABLET | Freq: Three times a day (TID) | ORAL | Status: DC | PRN

## 2013-08-18 MED ORDER — HEMOSTATIC AGENTS (NO CHARGE) OPTIME
TOPICAL | Status: DC | PRN
  Administered 2013-08-18: 1 via TOPICAL

## 2013-08-18 MED ORDER — ROCURONIUM BROMIDE 50 MG/5ML IV SOLN
INTRAVENOUS | Status: AC
  Filled 2013-08-18: qty 1

## 2013-08-18 MED ORDER — SODIUM CHLORIDE 0.9 % IV SOLN: INTRAVENOUS | Status: DC

## 2013-08-18 MED ORDER — EPHEDRINE SULFATE 50 MG/ML IJ SOLN
INTRAMUSCULAR | Status: AC
  Filled 2013-08-18: qty 1

## 2013-08-18 MED ORDER — MAGNESIUM HYDROXIDE 400 MG/5ML PO SUSP: 30.0000 mL | Freq: Every day | ORAL | Status: DC | PRN

## 2013-08-18 MED ORDER — PHENYLEPHRINE 40 MCG/ML (10ML) SYRINGE FOR IV PUSH (FOR BLOOD PRESSURE SUPPORT)
PREFILLED_SYRINGE | INTRAVENOUS | Status: AC
  Filled 2013-08-18: qty 10

## 2013-08-18 MED ORDER — BISACODYL 10 MG RE SUPP: 10.0000 mg | Freq: Every day | RECTAL | Status: DC | PRN

## 2013-08-18 MED ORDER — INSULIN ASPART 100 UNIT/ML IV SOLN
INTRAVENOUS | Status: DC | PRN
  Administered 2013-08-18: 5 [IU] via INTRAVENOUS

## 2013-08-18 MED ORDER — LIDOCAINE HCL (CARDIAC) 20 MG/ML IV SOLN
INTRAVENOUS | Status: AC
  Filled 2013-08-18: qty 5

## 2013-08-18 MED ORDER — METOCLOPRAMIDE HCL 5 MG/ML IJ SOLN
INTRAMUSCULAR | Status: DC | PRN
  Administered 2013-08-18: 10 mg via INTRAVENOUS

## 2013-08-18 MED ORDER — INSULIN PUMP
SUBCUTANEOUS | Status: DC
  Administered 2013-08-18: 59 via SUBCUTANEOUS
  Filled 2013-08-18: qty 1

## 2013-08-18 MED ORDER — PROPOFOL 10 MG/ML IV BOLUS
INTRAVENOUS | Status: AC
  Filled 2013-08-18: qty 20

## 2013-08-18 SURGICAL SUPPLY — 62 items
BAG DECANTER FOR FLEXI CONT (MISCELLANEOUS) ×2 IMPLANT
BENZOIN TINCTURE PRP APPL 2/3 (GAUZE/BANDAGES/DRESSINGS) ×2 IMPLANT
BLADE SURG ROTATE 9660 (MISCELLANEOUS) IMPLANT
BRUSH SCRUB EZ PLAIN DRY (MISCELLANEOUS) ×2 IMPLANT
BUR ACORN 5.0 (BURR) ×2 IMPLANT
BUR ACORN 6.0 (BURR) ×2 IMPLANT
BUR MATCHSTICK NEURO 3.0 LAGG (BURR) ×2 IMPLANT
CANISTER SUCT 3000ML (MISCELLANEOUS) ×2 IMPLANT
CONT SPEC 4OZ CLIKSEAL STRL BL (MISCELLANEOUS) ×2 IMPLANT
DERMABOND ADVANCED (GAUZE/BANDAGES/DRESSINGS) ×1
DERMABOND ADVANCED .7 DNX12 (GAUZE/BANDAGES/DRESSINGS) ×1 IMPLANT
DRAPE LAPAROTOMY 100X72X124 (DRAPES) ×2 IMPLANT
DRAPE MICROSCOPE LEICA (MISCELLANEOUS) ×2 IMPLANT
DRAPE POUCH INSTRU U-SHP 10X18 (DRAPES) ×2 IMPLANT
DRAPE SURG 17X23 STRL (DRAPES) ×8 IMPLANT
ELECT BLADE 4.0 EZ CLEAN MEGAD (MISCELLANEOUS) ×2
ELECT REM PT RETURN 9FT ADLT (ELECTROSURGICAL) ×2
ELECTRODE BLDE 4.0 EZ CLN MEGD (MISCELLANEOUS) ×1 IMPLANT
ELECTRODE REM PT RTRN 9FT ADLT (ELECTROSURGICAL) ×1 IMPLANT
GAUZE SPONGE 4X4 16PLY XRAY LF (GAUZE/BANDAGES/DRESSINGS) IMPLANT
GLOVE BIO SURGEON STRL SZ8.5 (GLOVE) ×2 IMPLANT
GLOVE BIOGEL PI IND STRL 7.5 (GLOVE) ×1 IMPLANT
GLOVE BIOGEL PI IND STRL 8 (GLOVE) ×1 IMPLANT
GLOVE BIOGEL PI INDICATOR 7.5 (GLOVE) ×1
GLOVE BIOGEL PI INDICATOR 8 (GLOVE) ×1
GLOVE ECLIPSE 7.5 STRL STRAW (GLOVE) ×6 IMPLANT
GLOVE EXAM NITRILE LRG STRL (GLOVE) IMPLANT
GLOVE EXAM NITRILE MD LF STRL (GLOVE) IMPLANT
GLOVE EXAM NITRILE XL STR (GLOVE) IMPLANT
GLOVE EXAM NITRILE XS STR PU (GLOVE) IMPLANT
GLOVE SS BIOGEL STRL SZ 8 (GLOVE) ×1 IMPLANT
GLOVE SUPERSENSE BIOGEL SZ 8 (GLOVE) ×1
GLOVE SURG SS PI 7.0 STRL IVOR (GLOVE) ×6 IMPLANT
GOWN BRE IMP SLV AUR LG STRL (GOWN DISPOSABLE) IMPLANT
GOWN BRE IMP SLV AUR XL STRL (GOWN DISPOSABLE) ×2 IMPLANT
GOWN STRL REIN 2XL LVL4 (GOWN DISPOSABLE) IMPLANT
GOWN STRL REUS W/ TWL LRG LVL3 (GOWN DISPOSABLE) ×1 IMPLANT
GOWN STRL REUS W/ TWL XL LVL3 (GOWN DISPOSABLE) ×2 IMPLANT
GOWN STRL REUS W/TWL LRG LVL3 (GOWN DISPOSABLE) ×1
GOWN STRL REUS W/TWL XL LVL3 (GOWN DISPOSABLE) ×2
KIT BASIN OR (CUSTOM PROCEDURE TRAY) ×2 IMPLANT
KIT ROOM TURNOVER OR (KITS) ×2 IMPLANT
NEEDLE HYPO 18GX1.5 BLUNT FILL (NEEDLE) ×2 IMPLANT
NEEDLE HYPO 21X1.5 SAFETY (NEEDLE) IMPLANT
NEEDLE HYPO 22GX1.5 SAFETY (NEEDLE) ×2 IMPLANT
NS IRRIG 1000ML POUR BTL (IV SOLUTION) ×2 IMPLANT
PACK LAMINECTOMY NEURO (CUSTOM PROCEDURE TRAY) ×2 IMPLANT
PAD ARMBOARD 7.5X6 YLW CONV (MISCELLANEOUS) ×6 IMPLANT
PATTIES SURGICAL .5 X1 (DISPOSABLE) IMPLANT
RUBBERBAND STERILE (MISCELLANEOUS) ×4 IMPLANT
SPONGE GAUZE 4X4 12PLY (GAUZE/BANDAGES/DRESSINGS) ×2 IMPLANT
SPONGE SURGIFOAM ABS GEL SZ50 (HEMOSTASIS) ×2 IMPLANT
STRIP CLOSURE SKIN 1/2X4 (GAUZE/BANDAGES/DRESSINGS) ×2 IMPLANT
SUT VIC AB 1 CT1 18XBRD ANBCTR (SUTURE) ×1 IMPLANT
SUT VIC AB 1 CT1 8-18 (SUTURE) ×1
SUT VIC AB 2-0 CP2 18 (SUTURE) ×4 IMPLANT
SYR 20CC LL (SYRINGE) IMPLANT
SYR 20ML ECCENTRIC (SYRINGE) ×2 IMPLANT
SYR 5ML LL (SYRINGE) ×2 IMPLANT
TOWEL OR 17X24 6PK STRL BLUE (TOWEL DISPOSABLE) ×2 IMPLANT
TOWEL OR 17X26 10 PK STRL BLUE (TOWEL DISPOSABLE) ×2 IMPLANT
WATER STERILE IRR 1000ML POUR (IV SOLUTION) ×2 IMPLANT

## 2013-08-18 NOTE — Progress Notes (Signed)
Orthopedic Tech Progress Note Patient Details:  [name] [birth year] [id number]  Patient ID: Jonathan Hensley, male   DOB: May 15, 1941, 73 y.o.   MRN: [id number] Brace order completed by Warnell Forester, Teshara Moree 08/18/2013, 1:32 PM

## 2013-08-18 NOTE — Anesthesia Postprocedure Evaluation (Signed)
Anesthesia Post Note  Patient: Jonathan Hensley  Procedure(s) Performed: Procedure(s) (LRB): Left Lumbar Five-Sacral One Microdiskectomy (Left)  Anesthesia type: General  Patient location: PACU  Post pain: Pain level controlled  Post assessment: Patient's Cardiovascular Status Stable  Last Vitals:  Filed Vitals:   08/18/13 1200  BP:   Pulse: 59  Temp:   Resp: 14    Post vital signs: Reviewed and stable  Level of consciousness: alert  Complications: No apparent anesthesia complications

## 2013-08-18 NOTE — Progress Notes (Signed)
Filed Vitals:   08/18/13 1215 08/18/13 1226 08/18/13 1230 08/18/13 1250  BP:   165/69 181/69  Pulse: 58 58 59 63  Temp:   97.8 F (36.6 C) 97.7 F (36.5 C)  TempSrc:      Resp: 13 13 13 16   Weight:      SpO2: 100% 100% 100% 96%    CBC  Recent Labs  08/18/13 0651  WBC 6.5  HGB 13.6  HCT 39.7  PLT 203   BMET  Recent Labs  08/18/13 0651  NA 141  K 4.6  CL 100  CO2 26  GLUCOSE 280*  BUN 29*  CREATININE 1.13  CALCIUM 9.5    Patient sitting up in the side of the bed, eating lunch. Has walked to the bathroom, twice. Voiding well. Incision clean and dry. Good relief of radicular pain.  Plan: Encouraged to ambulate in the halls. We'll continue to progress through postoperative recovery.  [name], MD 08/18/2013, 2:32 PM

## 2013-08-18 NOTE — Transfer of Care (Signed)
Immediate Anesthesia Transfer of Care Note  Patient: Jonathan Hensley  Procedure(s) Performed: Procedure(s) with comments: Left Lumbar Five-Sacral One Microdiskectomy (Left) - Left Lumbar Five-Sacral One Microdiskectomy  Patient Location: PACU  Anesthesia Type:General  Level of Consciousness: awake, alert , oriented and patient cooperative  Airway & Oxygen Therapy: Patient Spontanous Breathing and Patient connected to nasal cannula oxygen  Post-op Assessment: Report given to PACU RN, Post -op Vital signs reviewed and stable and Patient moving all extremities X 4  Post vital signs: Reviewed and stable  Complications: No apparent anesthesia complications

## 2013-08-18 NOTE — Progress Notes (Signed)
Insulin pump placed to abd per patient and  Insulin gtt turned off

## 2013-08-18 NOTE — H&P (Signed)
Subjective: Patient is a 73 y.o. male who is admitted for treatment of acute left lumbar radiculopathy, secondary to an acute left L5-S1 lumbar disc herniation.  Symptoms began 10 days ago, and has not responded to medical management. MRI scan reveals a left L5-S1 lumbar disc herniation.    History is notable that I performed a L4-5 lumbar discectomy in March 2000 and, he suffered a recurrent lumbar disc herniation, and underwent a lumbar fusion at L4-5 and July of 2010. He did well following that surgery. While I was out of the office last week, he was seen by Dr. Earle Gell in urgent consultation, and surgery was scheduled for today. However Dr. Arnoldo Morale is not arrived at the hospital, and after several hours the patient has insisted on going ahead with surgery, and I've reassessed the patient, and infuse the request agree to proceed with surgery.  Patient is admitted for a left L5-S1 lumbar laminotomy and microdiscectomy.   Patient Active Problem List   Diagnosis Date Noted  . Type II or unspecified type diabetes mellitus without mention of complication, uncontrolled 12/03/2012  . Anemia 12/03/2012  . Occipital scalp laceration 12/19/2011  . DM type 2 (diabetes mellitus, type 2) 12/19/2011  . Hypertension 12/19/2011  . Diabetic retinopathy associated with type 2 diabetes mellitus 12/19/2011  . Hyperlipidemia LDL goal < 70 12/19/2011  . History of prostate cancer   . Prostate cancer 03/31/2011   Past Medical History  Diagnosis Date  . History of prostate cancer   . Hypertension   . Diabetes mellitus   . Nephrolithiasis   . Diabetic retinopathy   . Bulging lumbar disc   . Dysrhythmia     Atrial Fibrillation  . Sleep apnea     uses cpap  . Arthritis     rheumatoid in his hands  . Cancer     Past Surgical History  Procedure Laterality Date  . Back surgery    . Brachytherapy    . Cystoscopy    . Left l4-l5 lumbar laminotomy and microdiskectomy with  07/14/2008    . Scleral  buckle, laser photocoagulation, and anterior chamber  01/04/2009  . Bilateral l4-5 lumbar decompression including bilateral  11/2008  . Lithotripsy    . Appendectomy    . Rotator cuff surgery    . Colonoscopy      Prescriptions prior to admission  Medication Sig Dispense Refill  . apixaban (ELIQUIS) 5 MG TABS tablet Take 5 mg by mouth 2 (two) times daily.      . benazepril (LOTENSIN) 40 MG tablet Take 40 mg by mouth at bedtime.       Marland Kitchen ezetimibe (ZETIA) 10 MG tablet Take 10 mg by mouth daily.      . metFORMIN (GLUCOPHAGE) 1000 MG tablet Take 1 tablet (1,000 mg total) by mouth 2 (two) times daily with a meal.  180 tablet  3  . oxyCODONE-acetaminophen (PERCOCET) 5-325 MG per tablet Take 2 tablets by mouth every 4 (four) hours as needed.  20 tablet  0  . simvastatin (ZOCOR) 20 MG tablet Take 20 mg by mouth daily.      Marland Kitchen acetaminophen (TYLENOL) 500 MG tablet Take 500 mg by mouth every 8 (eight) hours as needed. pain      . cyclobenzaprine (FLEXERIL) 10 MG tablet Take 1 tablet (10 mg total) by mouth 3 (three) times daily as needed for muscle spasms.  30 tablet  0  . fluticasone (CUTIVATE) 0.05 % cream Apply topically 2 (two) times  daily.  60 g  3  . insulin aspart (NOVOLOG) 100 UNIT/ML injection Inject 20 Units into the skin once. Use with V-Go pump  3 vial  3  . ketoconazole (NIZORAL) 2 % cream Apply 1 application topically 2 (two) times daily.  60 g  3  . oxyCODONE-acetaminophen (PERCOCET/ROXICET) 5-325 MG per tablet Take 1 tablet by mouth every 8 (eight) hours as needed for severe pain.  11 tablet  0   Allergies  Allergen Reactions  . Other Other (See Comments)    Beta or alpha blockers: severe dizziness    History  Substance Use Topics  . Smoking status: Former Smoker    Quit date: 07/12/1972  . Smokeless tobacco: Never Used  . Alcohol Use: No    Family History  Problem Relation Age of Onset  . Heart failure Father   . Heart failure Mother   . Heart failure Brother     Half  brother.     Review of Systems A comprehensive review of systems was negative.  Objective: Vital signs in last 24 hours: Temp:  [97.7 F (36.5 C)] 97.7 F (36.5 C) (04/06 0621) Pulse Rate:  [59] 59 (04/06 0621) Resp:  [18] 18 (04/06 0621) BP: (148)/(77) 148/77 mmHg (04/06 0621) SpO2:  [97 %] 97 % (04/06 0621) Weight:  [100.245 kg (221 lb)] 100.245 kg (221 lb) (04/06 1497)  EXAM: Patient is a well-developed well-nourished white male in no acute distress. Lungs are clear to auscultation , the patient has symmetrical respiratory excursion. Heart has a regular rate and rhythm normal S1 and S2 no murmur.   Abdomen is soft nontender nondistended bowel sounds are present. Extremity examination shows no clubbing cyanosis or edema. Motor examination shows mild weakness of the left dorsiflexor and EHL, 4-4+/5, left plantar flexor is 5. The right dorsiflexor, EHL, and plantar flexor are 5. Sensation is intact to pinprick in the distal lower extremities. Reflexes are diminished throughout. Toes are downgoing bilaterally.  Data Review:CBC    Component Value Date/Time   WBC 6.5 08/18/2013 0651   RBC 4.39 08/18/2013 0651   HGB 13.6 08/18/2013 0651   HCT 39.7 08/18/2013 0651   PLT 203 08/18/2013 0651   MCV 90.4 08/18/2013 0651   MCH 31.0 08/18/2013 0651   MCHC 34.3 08/18/2013 0651   RDW 12.4 08/18/2013 0651   LYMPHSABS 1.1 06/02/2013 1243   MONOABS 0.3 06/02/2013 1243   EOSABS 0.2 06/02/2013 1243   BASOSABS 0.0 06/02/2013 1243                          BMET    Component Value Date/Time   NA 141 08/18/2013 0651   K 4.6 08/18/2013 0651   CL 100 08/18/2013 0651   CO2 26 08/18/2013 0651   GLUCOSE 280* 08/18/2013 0651   BUN 29* 08/18/2013 0651   CREATININE 1.13 08/18/2013 0651   CREATININE 1.08 06/02/2013 1243   CALCIUM 9.5 08/18/2013 0651   GFRNONAA 63* 08/18/2013 0651   GFRAA 73* 08/18/2013 0651     Assessment/Plan: Patient with acute left lumbar radiculopathy secondary to a left L5-S1 lumbar disc herniation.  I've  discussed with the patient the nature of his condition, the nature the surgical procedure, the typical length of surgery, hospital stay, and overall recuperation. We discussed limitations postoperatively. I discussed risks of surgery including risks of infection, bleeding, possibly need for transfusion, the risk of nerve root dysfunction with pain, weakness, numbness, or paresthesias, or  risk of dural tear and CSF leakage and possible need for further surgery, the risk of recurrent disc herniation and the possible need for further surgery, and the risk of anesthetic complications including myocardial infarction, stroke, pneumonia, and death. Understanding all this the patient does wish to proceed with surgery and is admitted for such.    [name], MD 08/18/2013 9:29 AM

## 2013-08-18 NOTE — Anesthesia Preprocedure Evaluation (Signed)
Anesthesia Evaluation  Patient identified by MRN, date of birth, ID band Patient awake    Reviewed: Allergy & Precautions, H&P , NPO status , Patient's Chart, lab work & pertinent test results, reviewed documented beta blocker date and time   Airway Mallampati: II TM Distance: >3 FB Neck ROM: full    Dental   Pulmonary sleep apnea and Continuous Positive Airway Pressure Ventilation , former smoker,  breath sounds clear to auscultation        Cardiovascular hypertension, On Medications + dysrhythmias Atrial Fibrillation Rhythm:regular     Neuro/Psych negative psych ROS   GI/Hepatic negative GI ROS, Neg liver ROS,   Endo/Other  diabetes, Insulin Dependent  Renal/GU Renal diseasenegative Renal ROS  negative genitourinary   Musculoskeletal   Abdominal   Peds  Hematology negative hematology ROS (+) anemia ,   Anesthesia Other Findings See surgeon's H&P   Reproductive/Obstetrics negative OB ROS                           Anesthesia Physical Anesthesia Plan  ASA: III  Anesthesia Plan: General   Post-op Pain Management:    Induction: Intravenous  Airway Management Planned: Oral ETT  Additional Equipment:   Intra-op Plan:   Post-operative Plan: Extubation in OR  Informed Consent: I have reviewed the patients History and Physical, chart, labs and discussed the procedure including the risks, benefits and alternatives for the proposed anesthesia with the patient or authorized representative who has indicated his/her understanding and acceptance.   Dental Advisory Given  Plan Discussed with: CRNA and Surgeon  Anesthesia Plan Comments:         Anesthesia Quick Evaluation

## 2013-08-18 NOTE — Progress Notes (Signed)
RT spoke to patient about wearing CPAP. Patient states that he wears CPAP at home with 9.4 as pressure. RT showed the patient the mask interfaces that were available and the patient opted to not wear the CPAP tonight. RT advised the patient if he changes his mind to let nurse know and RT will bring the CPAP and mask back for him.  RT will continue to monitor.

## 2013-08-18 NOTE — Op Note (Signed)
08/18/2013  11:07 AM  PATIENT:  Jonathan Hensley  73 y.o. male  PRE-OPERATIVE DIAGNOSIS:  Left L5-S1 lumbar herniated disc, lumbar degenerative disc disease, lumbar spondylosis, left lumbar radiculopathy  POST-OPERATIVE DIAGNOSIS:   Left L5-S1 lumbar herniated disc, lumbar degenerative disc disease, lumbar spondylosis, left lumbar radiculopathy  PROCEDURE:  Procedure(s):  Left L5-S1 lumbar laminotomy and microdiscectomy, with microdissection, microsurgical technique, and the operating microscope  SURGEON:  Surgeon(s): [name], MD Ophelia Charter, MD  ASSISTANTS: Newman Pies, M.D.  ANESTHESIA:   general  EBL:  Total I/O In: 1000 [I.V.:1000] Out: -   BLOOD ADMINISTERED:none  COUNT: Correct per nursing staff  DICTATION: Patient was brought to the operating room and placed under general endotracheal anesthesia. Patient was turned to prone position the lumbar region was prepped with Betadine soap and solution and draped in a sterile fashion. The midline was infiltrated with local anesthetic with epinephrine. A localizing x-ray was taken and the L5-S1 level was identified. Midline incision was made over the L5-S1 level and was carried down through the subcutaneous tissue to the lumbar fascia. The lumbar fascia was incised on the left side and the paraspinal muscles were dissected from the spinous processes and lamina in a subperiosteal fashion. Another x-ray was taken and the L5-S1 intralaminar space was identified. The operating microscope was draped and brought into the field provided additional magnification, illumination, and visualization. Laminotomy was performed using the high-speed drill and Kerrison punches. The ligamentum flavum was carefully resected. The underlying thecal sac and nerve root were identified. The disc herniation was identified within the axilla of the left S1 nerve root. He was removed in a piecemeal fashion, with several large fragments. We were then able  to retract the thecal sac and nerve root medially, identified the rent in the annulus through which the disc herniation occurred. Several further small fragments were removed from within the rent. We then carefully examined the epidural space, no further disc herniation fragments were found, and it was felt that good decompression of the thecal sac and nerve root had been achieved. It is felt that there was no advantage to opening the disc space further, and entering the disc space. Once the discectomy was completed and good decompression of the thecal sac and nerve had been achieved hemostasis was established with the use of bipolar cautery and Gelfoam with thrombin. The Gelfoam was removed and hemostasis confirmed. We then instilled 2 cc of fentanyl into the epidural space. Deep fascia was closed with interrupted undyed 1 Vicryl sutures. Scarpa's fascia was closed with interrupted undyed 1 Vicryl sutures in the subcutaneous and subcuticular layer were closed with interrupted inverted 2-0 undyed Vicryl sutures. The skin edges were approximated with Dermabond. Following surgery the patient was turned back to a supine position to be reversed from the anesthetic extubated and transferred to the recovery room for further care.   PLAN OF CARE: Admit for overnight observation  PATIENT DISPOSITION:  PACU - hemodynamically stable.   Delay start of Pharmacological VTE agent (>24hrs) due to surgical blood loss or risk of bleeding:  yes

## 2013-08-18 NOTE — Progress Notes (Signed)
Patient with insulin pump, type II diabetic, and currently running. I notified Dr. Albertina Parr who request that I have patient turn pump off and remove it. He requested that I notify patient that he will manage it during surgery and then get him back on the pump post op. Patient advised of same.

## 2013-08-19 LAB — GLUCOSE, CAPILLARY
GLUCOSE-CAPILLARY: 160 mg/dL — AB (ref 70–99)
Glucose-Capillary: 127 mg/dL — ABNORMAL HIGH (ref 70–99)

## 2013-08-19 NOTE — Progress Notes (Signed)
Pt doing well. Pt given D/C instructions with verbal understanding of teaching. Pt D/C'd home via wheelchair @ [phone] per MD order. Pt is stable @ D/C and has no other needs. Holli Humbles, RN

## 2013-08-19 NOTE — Discharge Summary (Signed)
Physician Discharge Summary  Patient ID: Jonathan Hensley MRN: [id number] DOB/AGE: 1941/04/27 73 y.o.  Admit date: 08/18/2013 Discharge date: 08/19/2013  Admission Diagnoses:  Left L5-S1 lumbar herniated disc, lumbar degenerative disc disease, lumbar spondylosis, left lumbar radiculopathy  Discharge Diagnoses:  Left L5-S1 lumbar herniated disc, lumbar degenerative disc disease, lumbar spondylosis, left lumbar radiculopathy  Active Problems:   HNP (herniated nucleus pulposus), lumbar  Discharged Condition: good  Hospital Course: Patient was admitted, underwent a left L5-S1 lumbar laminotomy and microdiscectomy. Postoperatively he's had excellent relief of his radiculopathy. He is up and living in the halls. His wound is clean and dry. He is asking to be discharged to home. He has been given instructions regarding wound care and activities following discharge. He is to return for followup with me in 3 weeks.  Discharge Exam: Blood pressure 143/75, pulse 55, temperature 98.4 F (36.9 C), temperature source Oral, resp. rate 17, weight 100.245 kg (221 lb), SpO2 96.00%.  Disposition: home   Future Appointments Provider Department Dept Phone   09/01/2013 10:15 AM [facility] [phone]   09/01/2013 10:30 AM [name], MD [facility] [phone]   07/31/2014 11:30 AM Star Age, MD [facility] [phone]       Medication List         acetaminophen 500 MG tablet  Commonly known as:  TYLENOL  Take 500 mg by mouth every 8 (eight) hours as needed. pain     benazepril 40 MG tablet  Commonly known as:  LOTENSIN  Take 40 mg by mouth at bedtime.     cyclobenzaprine 10 MG tablet  Commonly known as:  FLEXERIL  Take 1 tablet (10 mg total) by mouth 3 (three) times daily as needed for muscle spasms.     ELIQUIS 5 MG Tabs tablet  Generic drug:  apixaban  Take 5 mg by mouth 2 (two) times daily.     ezetimibe 10  MG tablet  Commonly known as:  ZETIA  Take 10 mg by mouth daily.     fluticasone 0.05 % cream  Commonly known as:  CUTIVATE  Apply topically 2 (two) times daily.     insulin aspart 100 UNIT/ML injection  Commonly known as:  novoLOG  Inject 20 Units into the skin once. Use with V-Go pump     ketoconazole 2 % cream  Commonly known as:  NIZORAL  Apply 1 application topically 2 (two) times daily.     metFORMIN 1000 MG tablet  Commonly known as:  GLUCOPHAGE  Take 1 tablet (1,000 mg total) by mouth 2 (two) times daily with a meal.     oxyCODONE-acetaminophen 5-325 MG per tablet  Commonly known as:  PERCOCET  Take 2 tablets by mouth every 4 (four) hours as needed.     oxyCODONE-acetaminophen 5-325 MG per tablet  Commonly known as:  PERCOCET/ROXICET  Take 1 tablet by mouth every 8 (eight) hours as needed for severe pain.     simvastatin 20 MG tablet  Commonly known as:  ZOCOR  Take 20 mg by mouth daily.         Signed: [name], MD 08/19/2013, 7:48 AM

## 2013-08-25 ENCOUNTER — Encounter (HOSPITAL_COMMUNITY): Payer: Self-pay | Admitting: Neurosurgery

## 2013-09-01 ENCOUNTER — Encounter: Payer: Self-pay | Admitting: Endocrinology

## 2013-09-01 ENCOUNTER — Ambulatory Visit (INDEPENDENT_AMBULATORY_CARE_PROVIDER_SITE_OTHER): Payer: Medicare Other | Admitting: Endocrinology

## 2013-09-01 ENCOUNTER — Other Ambulatory Visit (INDEPENDENT_AMBULATORY_CARE_PROVIDER_SITE_OTHER): Payer: Medicare Other

## 2013-09-01 VITALS — BP 130/68 | HR 64 | Temp 98.1°F | Resp 14 | Ht 70.0 in | Wt 227.8 lb

## 2013-09-01 DIAGNOSIS — E119 Type 2 diabetes mellitus without complications: Secondary | ICD-10-CM | POA: Diagnosis not present

## 2013-09-01 DIAGNOSIS — E785 Hyperlipidemia, unspecified: Secondary | ICD-10-CM | POA: Diagnosis not present

## 2013-09-01 DIAGNOSIS — D649 Anemia, unspecified: Secondary | ICD-10-CM

## 2013-09-01 DIAGNOSIS — IMO0001 Reserved for inherently not codable concepts without codable children: Secondary | ICD-10-CM

## 2013-09-01 DIAGNOSIS — E1165 Type 2 diabetes mellitus with hyperglycemia: Principal | ICD-10-CM

## 2013-09-01 LAB — LIPID PANEL
CHOL/HDL RATIO: 3
Cholesterol: 136 mg/dL (ref 0–200)
HDL: 48.3 mg/dL (ref >39.00)
LDL Cholesterol: 60 mg/dL (ref 0–99)
Triglycerides: 141 mg/dL (ref 0.0–149.0)
VLDL: 28.2 mg/dL (ref 0.0–40.0)

## 2013-09-01 LAB — COMPREHENSIVE METABOLIC PANEL
ALBUMIN: 3.6 g/dL (ref 3.5–5.2)
ALK PHOS: 49 U/L (ref 39–117)
ALT: 14 U/L (ref 0–53)
AST: 16 U/L (ref 0–37)
BILIRUBIN TOTAL: 0.2 mg/dL — AB (ref 0.3–1.2)
BUN: 31 mg/dL — ABNORMAL HIGH (ref 6–23)
CO2: 26 meq/L (ref 19–32)
Calcium: 9.3 mg/dL (ref 8.4–10.5)
Chloride: 105 mEq/L (ref 96–112)
Creatinine, Ser: 1 mg/dL (ref 0.4–1.5)
GFR: 79.71 mL/min (ref >60.00)
Glucose, Bld: 158 mg/dL — ABNORMAL HIGH (ref 70–99)
Potassium: 4.2 mEq/L (ref 3.5–5.1)
Sodium: 139 mEq/L (ref 135–145)
Total Protein: 7 g/dL (ref 6.0–8.3)

## 2013-09-01 LAB — CBC
HCT: 36.4 % — ABNORMAL LOW (ref 39.0–52.0)
Hemoglobin: 12.1 g/dL — ABNORMAL LOW (ref 13.0–17.0)
MCHC: 33.3 g/dL (ref 30.0–36.0)
MCV: 92.2 fl (ref 78.0–100.0)
Platelets: 271 10*3/uL (ref 150.0–400.0)
RBC: 3.95 Mil/uL — ABNORMAL LOW (ref 4.22–5.81)
RDW: 12.9 % (ref 11.5–14.6)
WBC: 6.4 10*3/uL (ref 4.5–10.5)

## 2013-09-01 LAB — MICROALBUMIN / CREATININE URINE RATIO
Creatinine,U: 73.5 mg/dL
MICROALB UR: 4.2 mg/dL — AB (ref 0.0–1.9)
MICROALB/CREAT RATIO: 5.7 mg/g (ref 0.0–30.0)

## 2013-09-01 LAB — HEMOGLOBIN A1C: HEMOGLOBIN A1C: 7.8 % — AB (ref 4.6–6.5)

## 2013-09-01 IMAGING — CT CT HEAD W/O CM
4 of 9 series · 16 of 37 positions shown, 18 images · non-contrast
Comparison: Head CT 09/25/2009.

CT HEAD

CLINICAL DATA: History of trauma from a fall.  Loss of
consciousness.

CT HEAD WITHOUT CONTRAST
CT CERVICAL SPINE WITHOUT CONTRAST
TECHNIQUE: Multidetector CT imaging of the head and cervical spine
was performed following the standard protocol without intravenous
contrast.  Multiplanar CT image reconstructions of the cervical
spine were also generated.

[Series 3: recon 2: brain · axial · 0.47mm/px · z∈[-92,-42]mm · 3 of 80 slices shown]
[im 16/80  brain]
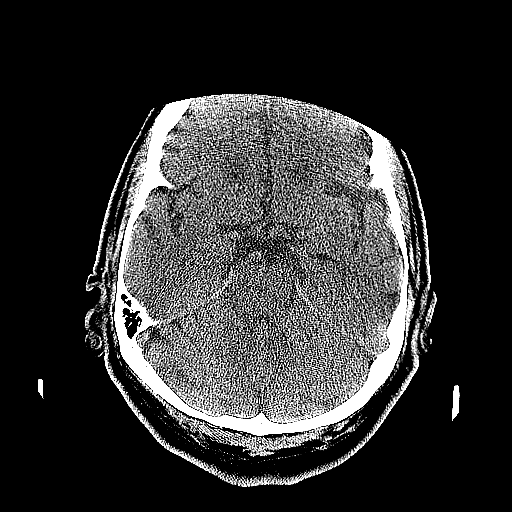
[im 32/80  brain]
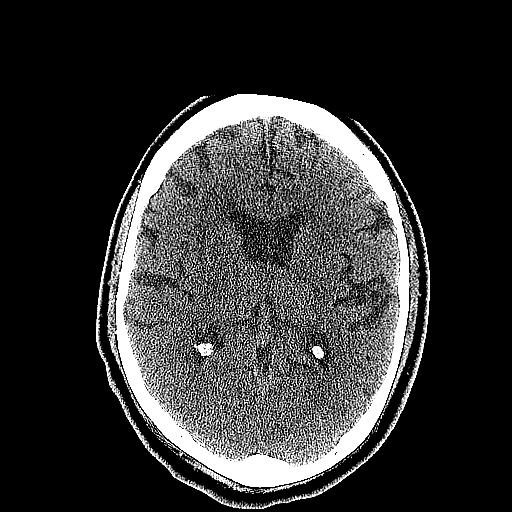
[im 48/80  brain]
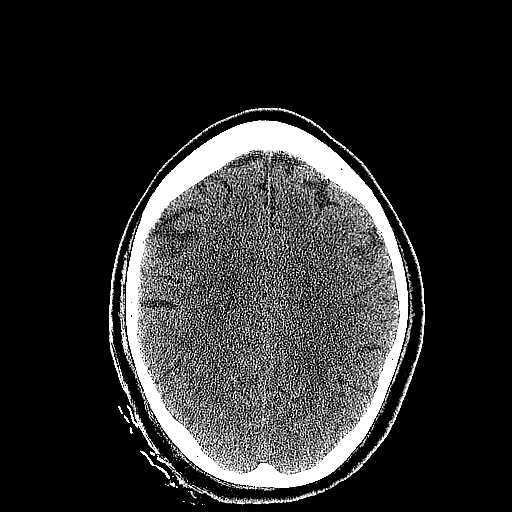

[Series 106: ortho st · axial · 0.43mm/px · z∈[-340,-235]mm · 5 of 91 slices shown, 7 images]
[im 16/91  brain]
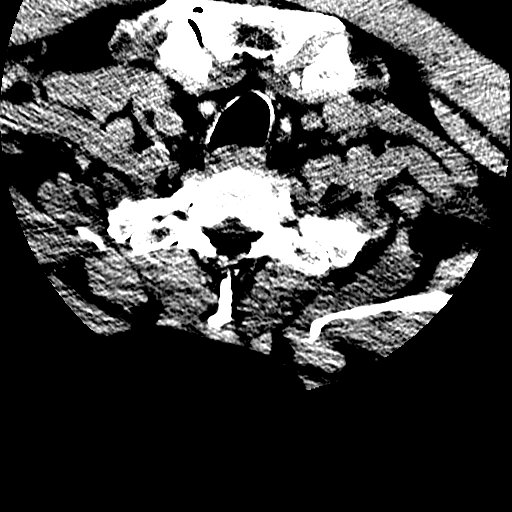
[im 16/91  bone]
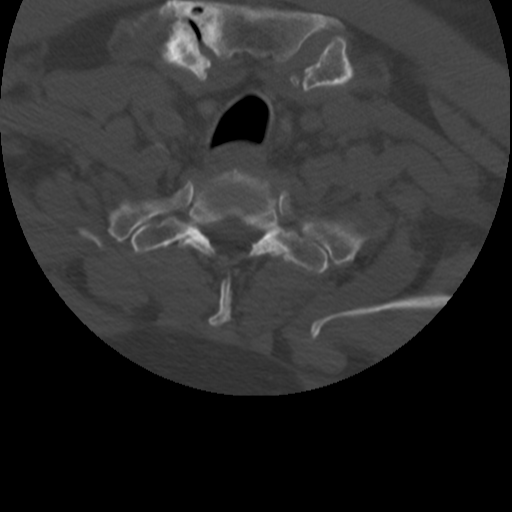
[im 31/91  brain]
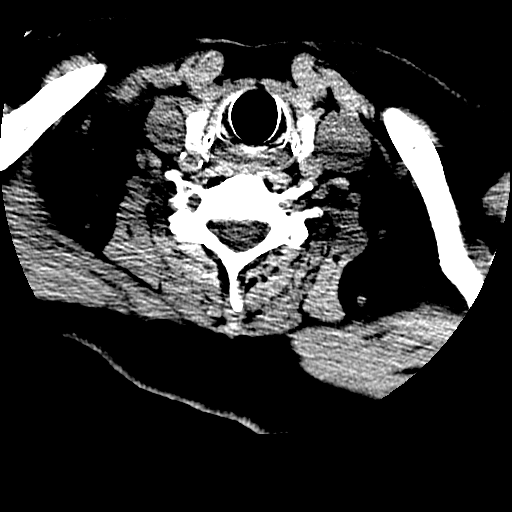
[im 46/91  brain]
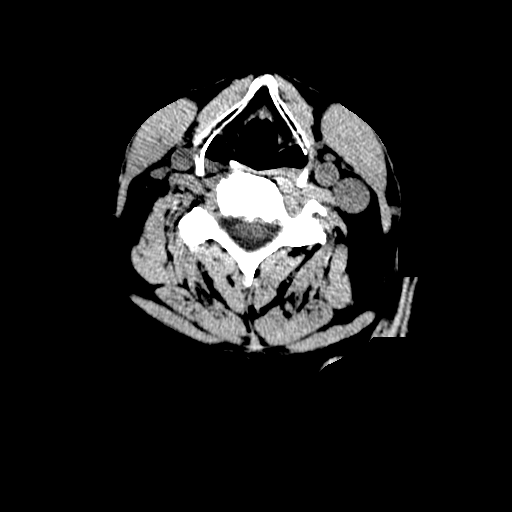
[im 61/91  brain]
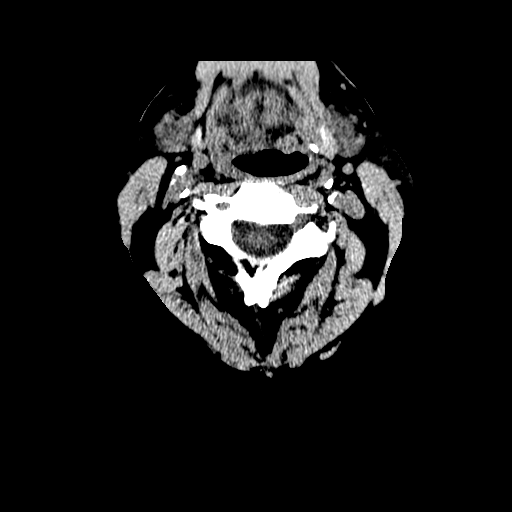
[im 76/91  brain]
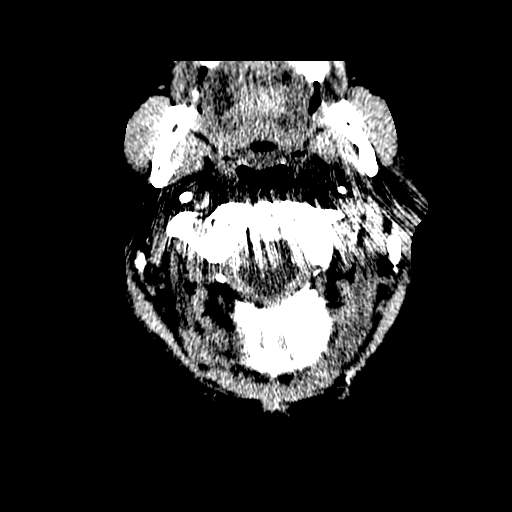
[im 76/91  bone]
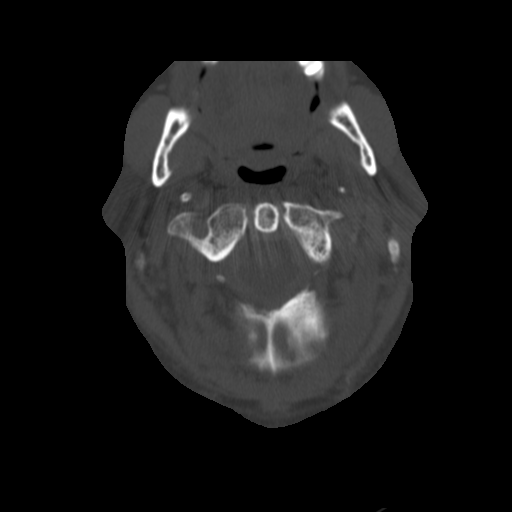

[Series 600: cor · coronal · 0.43mm/px · 3 of 49 slices shown]
[im 8/49  brain]
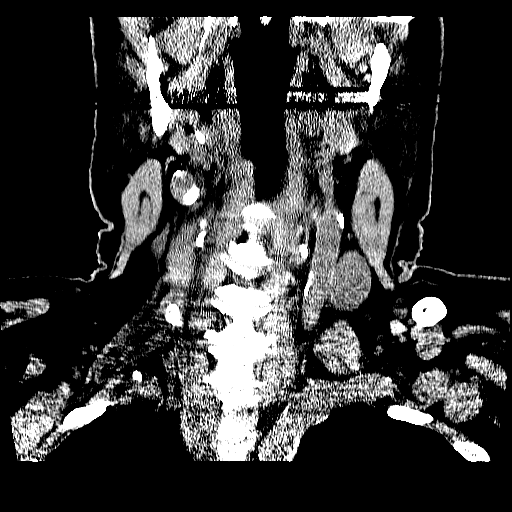
[im 17/49  brain]
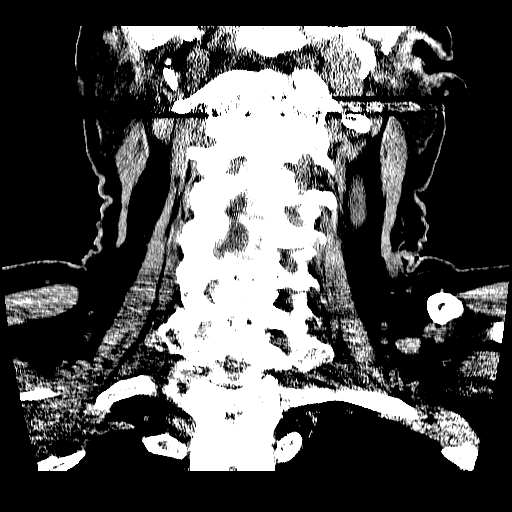
[im 27/49  brain]
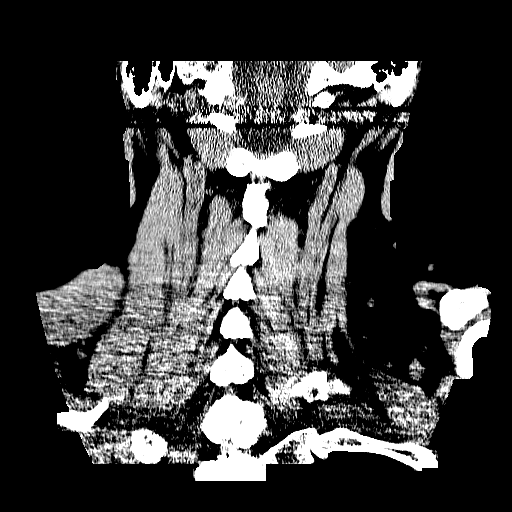

[Series 601: ortho · axial · 0.43mm/px · z∈[-340,-235]mm · 5 of 91 slices shown]
[im 16/91  brain]
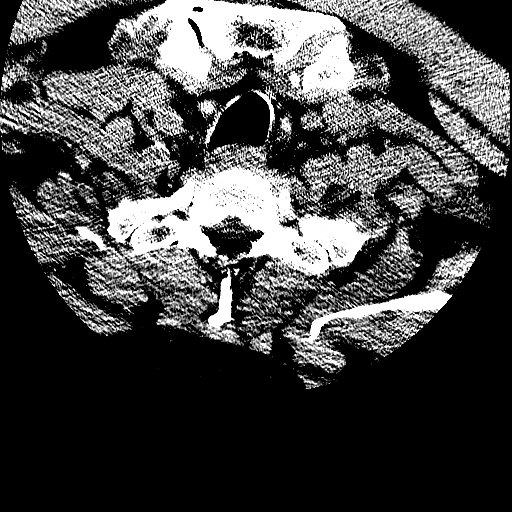
[im 31/91  brain]
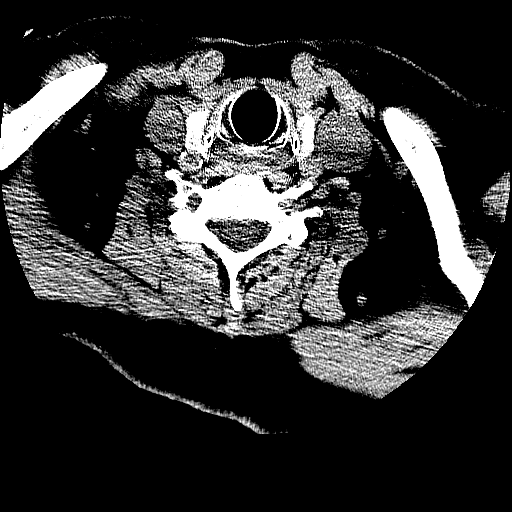
[im 46/91  brain]
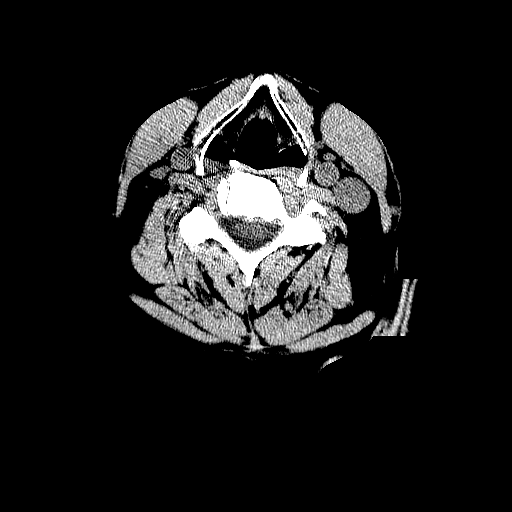
[im 61/91  brain]
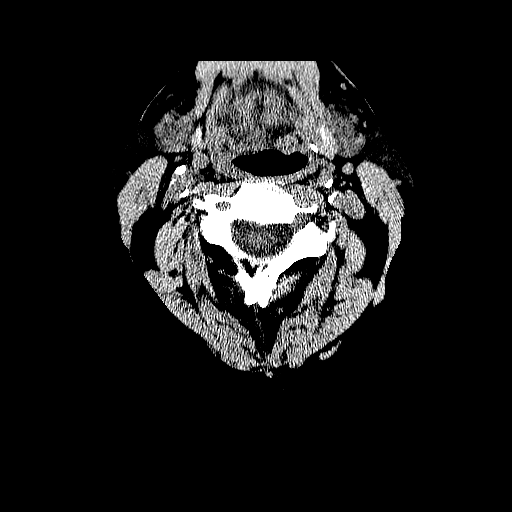
[im 76/91  brain]
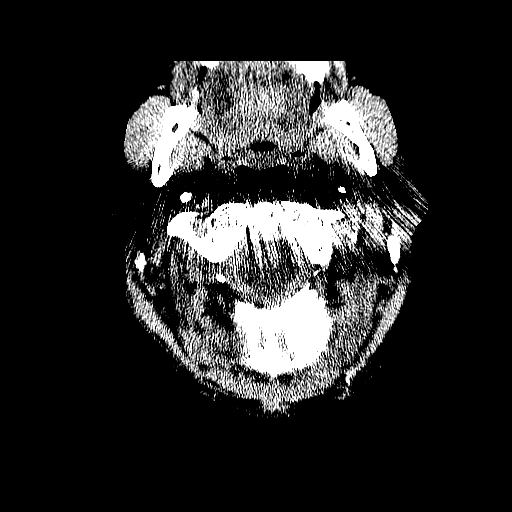

[16 of 37 positions shown; findings below may reference images not displayed]

FINDINGS: Soft tissue thickening and high attenuation in the right
parietal occipital scalp compatible with a contusion/laceration.
Overlying skin staples are noted.  All lacunar infarctions in the
left putamen and left extreme capsule, unchanged.  Patchy and
confluent areas of decreased attenuation throughout the deep and
periventricular white matter of the cerebral hemispheres
bilaterally, compatible with chronic microvascular ischemic changes
(similar to prior).  No acute displaced skull fractures are
identified.  No acute intracranial abnormality.  Specifically, no
evidence of acute post-traumatic intracranial hemorrhage, no
definite regions of acute/subacute cerebral ischemia, no focal
mass, mass effect, hydrocephalus or abnormal intra or extra-axial
fluid collections.  The visualized paranasal sinuses and mastoids
are well pneumatized.
IMPRESSION: 1.  No acute displaced skull fractures or acute intracranial
abnormalities.
2. Old left-sided lacunar infarctions and chronic microvascular
ischemic changes in the white matter, similar to prior study
09/25/2009.

CT CERVICAL SPINE
FINDINGS: No acute displaced cervical spine fractures are noted.
Mild straightening of cervical lordosis, presumably positional.
Prevertebral soft tissues are normal.  Multilevel degenerative disc
disease, multilevel degenerative disc disease most severe C5-C6 and
C6-C7.  Mild multilevel facet arthropathy is also noted.
Visualized portions of the upper thorax are unremarkable.
IMPRESSION: 1.  No evidence of significant acute traumatic injury to the
cervical spine.
2.  Mild multilevel degenerative disc disease and cervical
spondylosis, as above.

## 2013-09-01 IMAGING — CT CT CHEST W/ CM
3 of 4 series · 17 of 30 positions shown, 19 images · IV contrast (100ml omni 300)
Comparison: CT of the abdomen pelvis 11/09/2010

CT CHEST

CLINICAL DATA: Fall, loss of consciousness.

CT CHEST, ABDOMEN AND PELVIS WITH CONTRAST
TECHNIQUE: Multidetector CT imaging of the chest, abdomen and
pelvis was performed following the standard protocol during bolus
administration of intravenous contrast.
Contrast: 100mL OMNIPAQUE IOHEXOL 300 MG/ML  SOLN

[Series 2: chest/abd/pel · axial · 0.98mm/px · z∈[-580,-374]mm · 4 of 169 slices shown]
[im 29/169  lung]
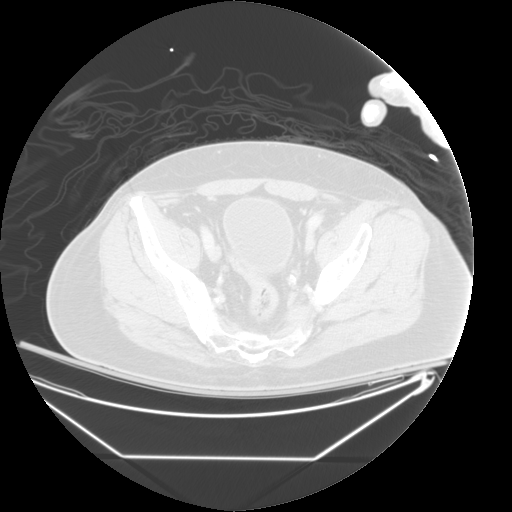
[im 57/169  lung]
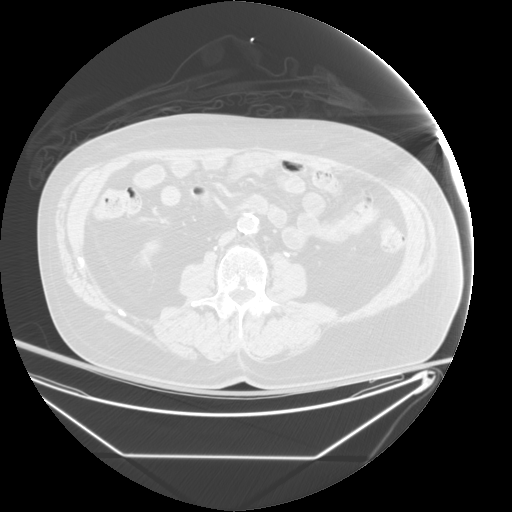
[im 74/169  lung]
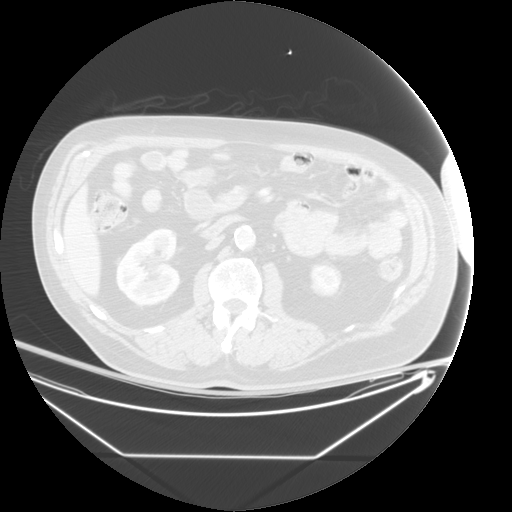
[im 85/169  lung]
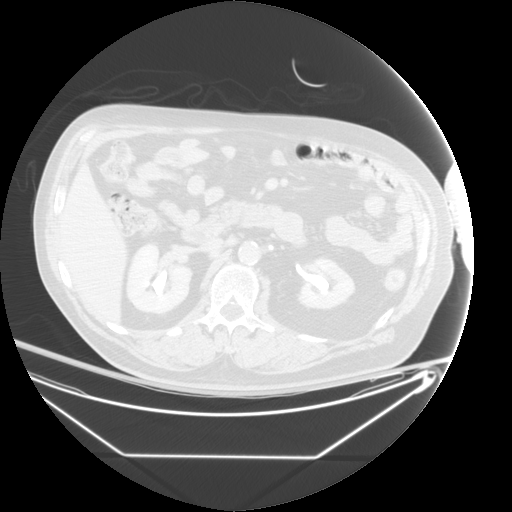

[Series 103: cor · coronal · 1.40mm/px · 5 of 155 slices shown]
[im 26/155  lung]
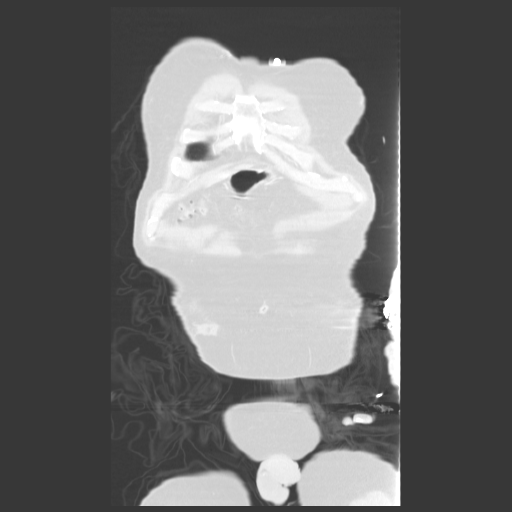
[im 52/155  lung]
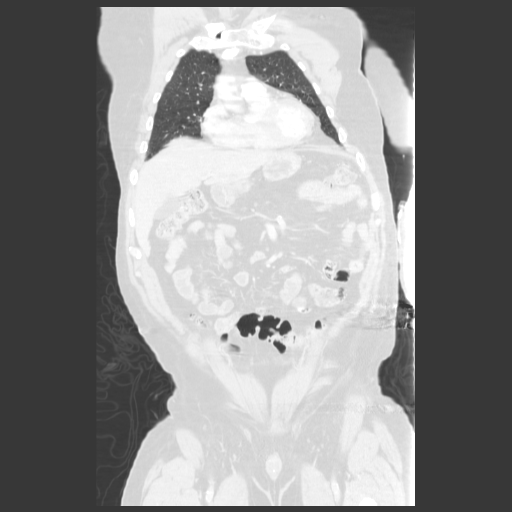
[im 78/155  lung]
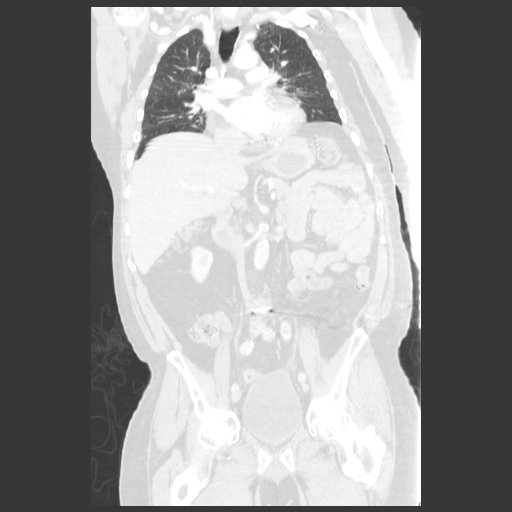
[im 103/155  lung]
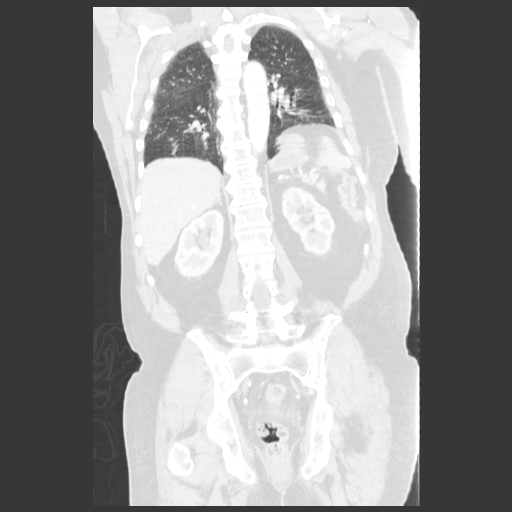
[im 129/155  lung]
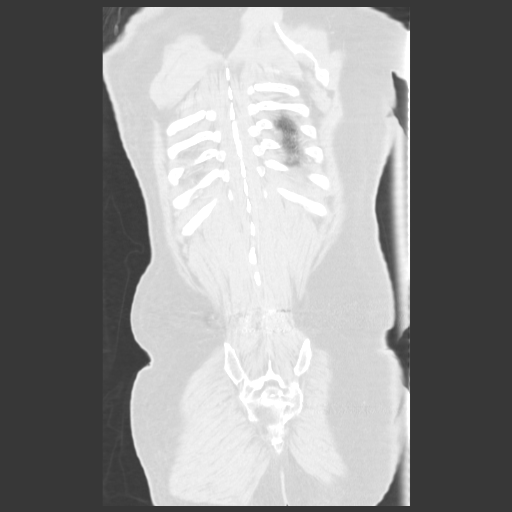

[Series 104: sag · sagittal · 1.40mm/px · 8 of 212 slices shown, 10 images]
[im 24/212  mediastinal]
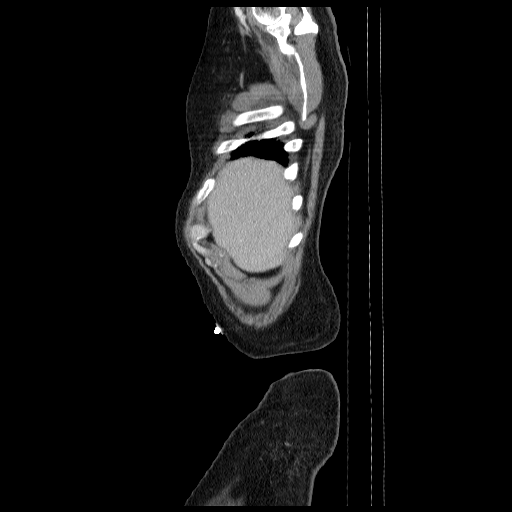
[im 24/212  lung]
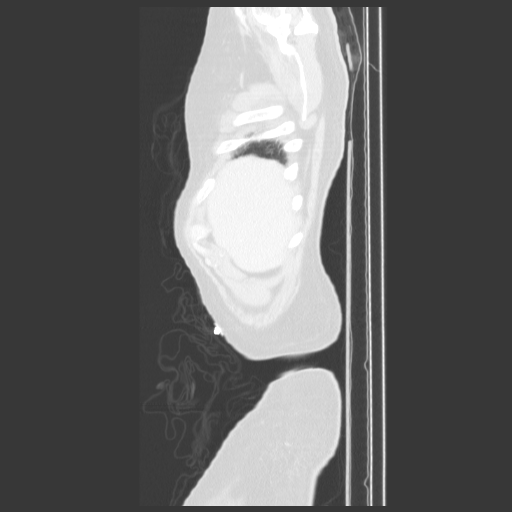
[im 47/212  lung]
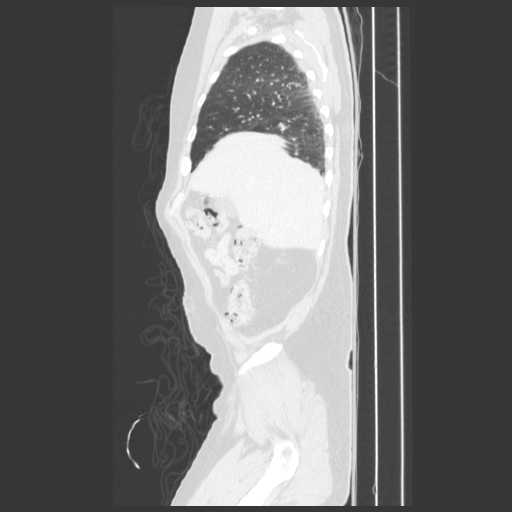
[im 71/212  lung]
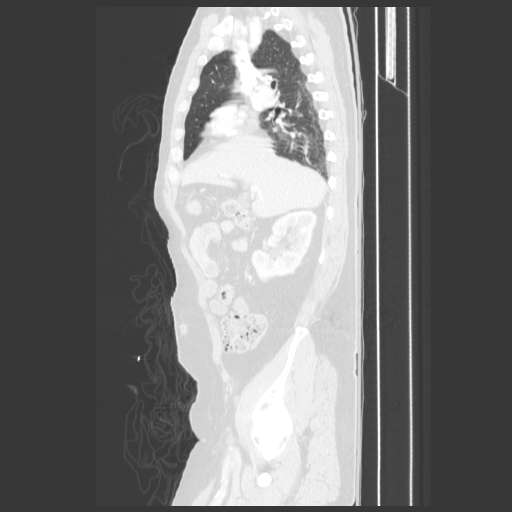
[im 94/212  lung]
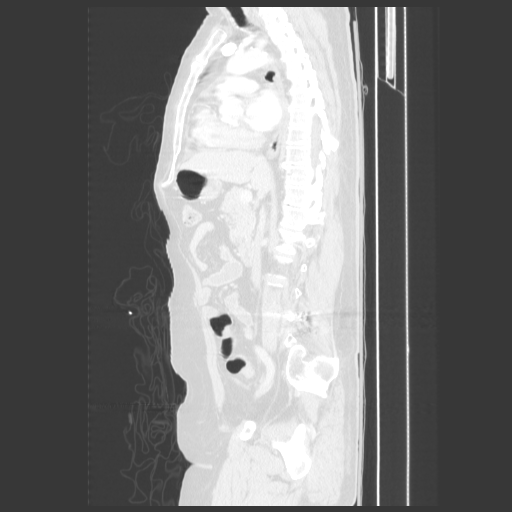
[im 118/212  mediastinal]
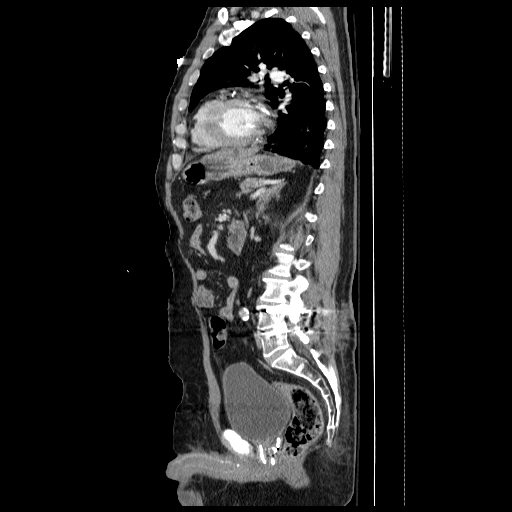
[im 118/212  lung]
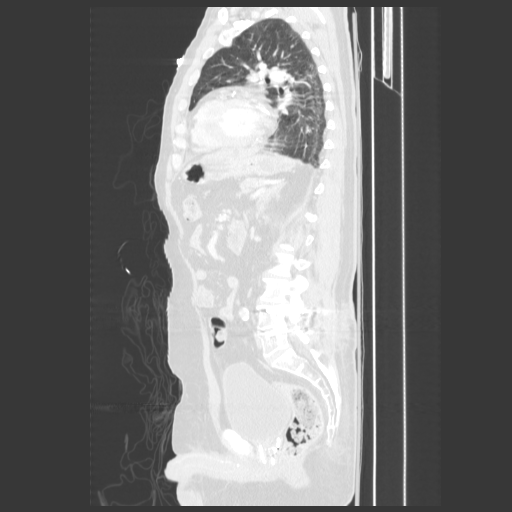
[im 141/212  lung]
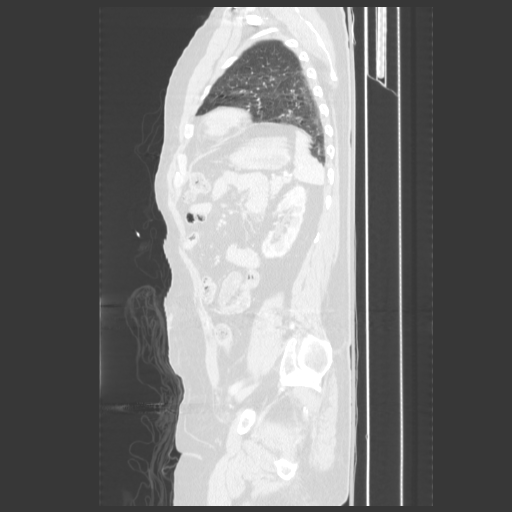
[im 165/212  lung]
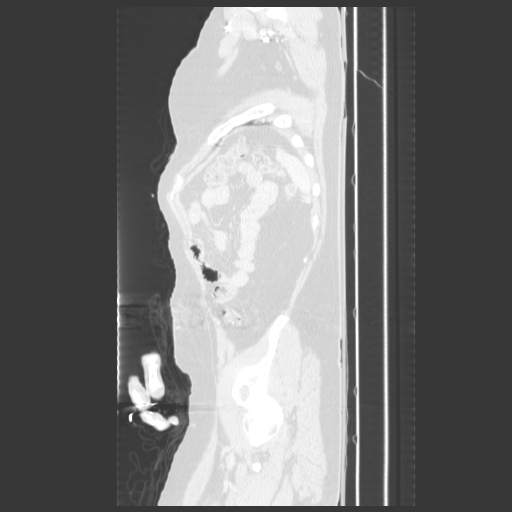
[im 188/212  lung]
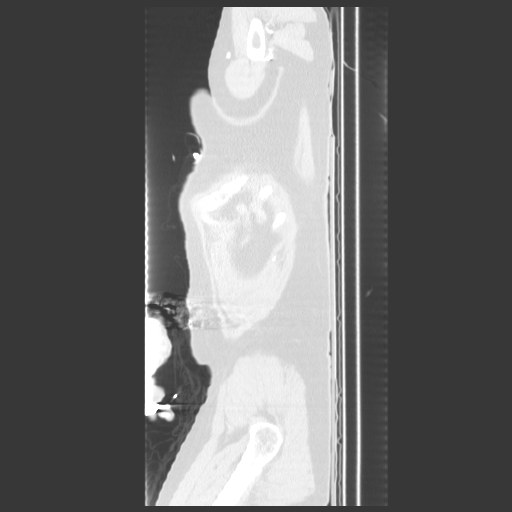

[17 of 30 positions shown; findings below may reference images not displayed]

FINDINGS: Cardiomegaly.  Dense coronary artery calcifications.
Aorta is normal caliber.  No mediastinal hematoma.  Trace
pericardial effusion.  No pleural effusion.  No confluent opacity
in the lungs.  No pneumothorax.

No acute bony abnormality. No mediastinal, hilar, or axillary
adenopathy.  Visualized thyroid and chest wall soft tissues
unremarkable.
IMPRESSION: Mild cardiomegaly.  Dense coronary artery calcifications.

Trace pericardial effusion.

No evidence of acute injury to the chest.

CT ABDOMEN AND PELVIS
FINDINGS: Liver, gallbladder, spleen, pancreas, adrenals and right
kidney are unremarkable.  Left kidney mildly atrophic.  Left renal
artery calcifications present.  Extensive aortic and iliac
calcifications.  No aneurysm.

Stomach, large and small bowel are grossly unremarkable.  Urinary
bladder unremarkable.  Radiation seeds in the prostate.  No free
fluid, free air or adenopathy.

Postsurgical changes from prior posterior fusion at L4-5.  No acute
bony abnormality.

Small soft tissue densities are noted in the anterior subcutaneous
soft tissues, possibly related to subcutaneous injections.
IMPRESSION: No acute findings in the abdomen or pelvis.  No evidence of acute
injury.

Radiation seeds in the prostate.

## 2013-09-01 NOTE — Progress Notes (Signed)
Patient ID: Jonathan Hensley, male   DOB: [birth year], 73 y.o.   MRN: [id number]   Reason for Appointment: Diabetes follow-up   History of Present Illness    Type 2 diabetes mellitus, date of diagnosis: 1994.   He has been on insulin for the last few years, starting with only bedtime NPH and subsequently progressing to basal bolus regimen Also taking glipizide and metformin long-term  The HbgA1c was highest in 2013 at 8.2  Recent history:  Because of more fluctuation in his blood sugars at all times, difficulty with compliance and inconvenience of the multiple insulin dose regimen as well as needing higher doses of insulin he was started on the V.-go pump in 05/2013. He is using the 20 basal rate and also doing boluses at meals and snacks with carbohydrate counting as before Since using the pump his blood sugars have been generally more consistent and he is very pleased that the level of control and convenience of the pump. Also A1c was improved at 7.3 Did not bring his monitor for download but he thinks his blood sugars are excellent. Not clear if he is checking readings consistently as before after meals He did have a few high readings when he had his back surgery about 2-3 weeks ago The insulin regimen is described as basal rate of 20 units on V- go pump; premeal rapid analog usually carbohydrate coverage 1:10; usually 6 units 3 times a day   Difficulties with medication adherence: None recently  Proper timing of medications in relation to meals: Yes.   Glucose monitoring: Accu-Chek, checking 4.6 times a day  Blood Glucose readings: Am: 130-140 , lowest 95 acs 120-130 Hypoglycemia: None recently reported    Wt Readings from Last 3 Encounters:  09/01/13 227 lb 12.8 oz (103.329 kg)  08/18/13 221 lb (100.245 kg)  08/18/13 221 lb (100.245 kg)   Meals: 2 meals per day and usually no breakfast.   Carbohydrate intake: low;  Sometimes eating out and usually lunch is the larger meal of the  day, snacks peanuts.  Physical activity: exercise: None recently since his back surgery and sciatica pain  Peripheral neuropathy: Yes.  Diabetic nephropathy: Occasional mild microalbuminuria     Lab Results  Component Value Date   HGBA1C 7.3* 06/02/2013   HGBA1C 7.8* 12/03/2012   HGBA1C 7.5* 12/19/2011   Lab Results  Component Value Date   MICROALBUR 6.32* 06/02/2013   LDLCALC 62 12/03/2012   CREATININE 1.13 08/18/2013         Medication List       This list is accurate as of: 09/01/13 10:42 AM.  Always use your most recent med list.               acetaminophen 500 MG tablet  Commonly known as:  TYLENOL  Take 500 mg by mouth every 8 (eight) hours as needed. pain     benazepril 40 MG tablet  Commonly known as:  LOTENSIN  Take 40 mg by mouth at bedtime.     cyclobenzaprine 10 MG tablet  Commonly known as:  FLEXERIL  Take 1 tablet (10 mg total) by mouth 3 (three) times daily as needed for muscle spasms.     ELIQUIS 5 MG Tabs tablet  Generic drug:  apixaban  Take 5 mg by mouth 2 (two) times daily.     ezetimibe 10 MG tablet  Commonly known as:  ZETIA  Take 10 mg by mouth daily.     fluticasone 0.05 %  cream  Commonly known as:  CUTIVATE  Apply topically 2 (two) times daily.     insulin aspart 100 UNIT/ML injection  Commonly known as:  novoLOG  Inject 20 Units into the skin once. Use with V-Go pump     ketoconazole 2 % cream  Commonly known as:  NIZORAL  Apply 1 application topically 2 (two) times daily.     metFORMIN 1000 MG tablet  Commonly known as:  GLUCOPHAGE  Take 1 tablet (1,000 mg total) by mouth 2 (two) times daily with a meal.     oxyCODONE-acetaminophen 5-325 MG per tablet  Commonly known as:  PERCOCET  Take 2 tablets by mouth every 4 (four) hours as needed.     oxyCODONE-acetaminophen 5-325 MG per tablet  Commonly known as:  PERCOCET/ROXICET  Take 1 tablet by mouth every 8 (eight) hours as needed for severe pain.     simvastatin 20 MG tablet   Commonly known as:  ZOCOR  Take 20 mg by mouth daily.        Allergies:  Allergies  Allergen Reactions  . Other Other (See Comments)    Beta or alpha blockers: severe dizziness    Past Medical History  Diagnosis Date  . History of prostate cancer   . Hypertension   . Diabetes mellitus   . Nephrolithiasis   . Diabetic retinopathy   . Bulging lumbar disc   . Dysrhythmia     Atrial Fibrillation  . Sleep apnea     uses cpap  . Arthritis     rheumatoid in his hands  . Cancer     Past Surgical History  Procedure Laterality Date  . Back surgery    . Brachytherapy    . Cystoscopy    . Left l4-l5 lumbar laminotomy and microdiskectomy with  07/14/2008    . Scleral buckle, laser photocoagulation, and anterior chamber  01/04/2009  . Bilateral l4-5 lumbar decompression including bilateral  11/2008  . Lithotripsy    . Appendectomy    . Rotator cuff surgery    . Colonoscopy    . Lumbar laminectomy/decompression microdiscectomy Left 08/18/2013    Procedure: Left Lumbar Five-Sacral One Microdiskectomy;  Surgeon: [name], MD;  Location: [facility];  Service: Neurosurgery;  Laterality: Left;  Left Lumbar Five-Sacral One Microdiskectomy    Family History  Problem Relation Age of Onset  . Heart failure Father   . Heart failure Mother   . Heart failure Brother     Half brother.    Social History:  reports that he quit smoking about 41 years ago. He has never used smokeless tobacco. He reports that he does not drink alcohol or use illicit drugs.  Review of Systems  Hypertension:  Home blood pressure not checked recently. Was advised to use an Omron monitor Blood pressure is very well controlled now with benazepril alone  HYPERLIPIDEMIA: Has been on simvastatin and Zetia for sometime with good results  Lab Results  Component Value Date   CHOL 133 12/03/2012   HDL 52.60 12/03/2012   LDLCALC 62 12/03/2012   TRIG 91.0 12/03/2012   CHOLHDL 3 12/03/2012     He has  had retinal detachment treated surgically in the past, followed by ophthalmologist periodically    Sleep apnea, is on CPAP   Anemia: This is mild and stable; stool Hemoccult  negative in 1/14   Examination:   BP 130/68  Pulse 64  Temp(Src) 98.1 F (36.7 C)  Resp 14  Ht  5\' 10"  (1.778 m)  Wt 227 lb 12.8 oz (103.329 kg)  BMI 32.69 kg/m2  SpO2 96%  Body mass index is 32.69 kg/(m^2).    Assesment:   1. Diabetes type 2, uncontrolled    The patient's diabetes control is overall fairly good by recall with continuing to use the V.-go pump.  He also has had overall less fluctuation and better A1c Did have a few high readings during his hospitalization for back surgery, probably was off the pump Since he did not bring his monitor today not clear if he has any abnormal blood sugar patterns but he reports good readings Also compliant with mealtime coverage consistently now We'll check his A1c and review his monitor download when brings it here in the next few days  2. Hypertension: Well controlled. He will continue the same medications   [name] 09/01/2013, 10:42 AM

## 2013-09-01 NOTE — Progress Notes (Signed)
Quick Note:  A1c slightly higher at 7.8, may be because of his surgery/inactivity, cholesterol and other labs okay ______

## 2013-09-11 DIAGNOSIS — I10 Essential (primary) hypertension: Secondary | ICD-10-CM | POA: Diagnosis not present

## 2013-09-11 DIAGNOSIS — E7800 Pure hypercholesterolemia, unspecified: Secondary | ICD-10-CM | POA: Diagnosis not present

## 2013-09-11 DIAGNOSIS — G4733 Obstructive sleep apnea (adult) (pediatric): Secondary | ICD-10-CM | POA: Diagnosis not present

## 2013-09-11 DIAGNOSIS — I4891 Unspecified atrial fibrillation: Secondary | ICD-10-CM | POA: Diagnosis not present

## 2013-09-16 DIAGNOSIS — E11329 Type 2 diabetes mellitus with mild nonproliferative diabetic retinopathy without macular edema: Secondary | ICD-10-CM | POA: Diagnosis not present

## 2013-09-16 DIAGNOSIS — H43819 Vitreous degeneration, unspecified eye: Secondary | ICD-10-CM | POA: Diagnosis not present

## 2013-09-16 DIAGNOSIS — H2510 Age-related nuclear cataract, unspecified eye: Secondary | ICD-10-CM | POA: Diagnosis not present

## 2013-09-16 DIAGNOSIS — E1139 Type 2 diabetes mellitus with other diabetic ophthalmic complication: Secondary | ICD-10-CM | POA: Diagnosis not present

## 2013-10-23 DIAGNOSIS — H2589 Other age-related cataract: Secondary | ICD-10-CM | POA: Diagnosis not present

## 2013-10-23 DIAGNOSIS — H526 Other disorders of refraction: Secondary | ICD-10-CM | POA: Diagnosis not present

## 2013-10-23 DIAGNOSIS — H532 Diplopia: Secondary | ICD-10-CM | POA: Diagnosis not present

## 2013-10-23 DIAGNOSIS — E119 Type 2 diabetes mellitus without complications: Secondary | ICD-10-CM | POA: Diagnosis not present

## 2013-10-30 ENCOUNTER — Other Ambulatory Visit: Payer: Self-pay | Admitting: Endocrinology

## 2013-11-06 ENCOUNTER — Telehealth: Payer: Self-pay | Admitting: Endocrinology

## 2013-11-06 NOTE — Telephone Encounter (Signed)
Patient is wanting to know the status of the prior auth on accu-check.

## 2013-11-10 ENCOUNTER — Telehealth: Payer: Self-pay | Admitting: Endocrinology

## 2013-11-10 NOTE — Telephone Encounter (Signed)
Pt just got a letter from [facility] the 90 day supply of VGo is going up to $46 pt does not like that--pt could not finish the message someone came to his home and he states he will call back

## 2013-11-10 NOTE — Telephone Encounter (Signed)
Pt calling back he was asking for a number for his insurance - pt advised to contact [place]

## 2013-11-17 ENCOUNTER — Telehealth: Payer: Self-pay | Admitting: Endocrinology

## 2013-11-17 ENCOUNTER — Telehealth: Payer: Self-pay | Admitting: *Deleted

## 2013-11-17 NOTE — Telephone Encounter (Signed)
Patient would like Suanne Marker to call him please    Thank You

## 2013-11-17 NOTE — Telephone Encounter (Signed)
Patient called, he said his insurance company has changed the status of his V-GO,  He spoke with his insurance company who said if you would write a letter saying this is a medical necessity and that nothing else can control his sugar they will more than like approve it.  Fax to [facility].

## 2013-11-18 NOTE — Telephone Encounter (Signed)
Linda, pl help

## 2013-11-26 ENCOUNTER — Encounter: Payer: Self-pay | Admitting: Endocrinology

## 2013-11-26 NOTE — Telephone Encounter (Signed)
Completed.

## 2013-11-30 DIAGNOSIS — Z0279 Encounter for issue of other medical certificate: Secondary | ICD-10-CM

## 2013-12-01 ENCOUNTER — Encounter: Payer: Self-pay | Admitting: Endocrinology

## 2013-12-01 ENCOUNTER — Ambulatory Visit (INDEPENDENT_AMBULATORY_CARE_PROVIDER_SITE_OTHER): Payer: Medicare Other | Admitting: Endocrinology

## 2013-12-01 VITALS — BP 124/84 | HR 64 | Temp 98.3°F | Resp 16 | Ht 70.0 in | Wt 228.8 lb

## 2013-12-01 DIAGNOSIS — I10 Essential (primary) hypertension: Secondary | ICD-10-CM | POA: Diagnosis not present

## 2013-12-01 DIAGNOSIS — IMO0001 Reserved for inherently not codable concepts without codable children: Secondary | ICD-10-CM

## 2013-12-01 DIAGNOSIS — D638 Anemia in other chronic diseases classified elsewhere: Secondary | ICD-10-CM

## 2013-12-01 DIAGNOSIS — E785 Hyperlipidemia, unspecified: Secondary | ICD-10-CM | POA: Diagnosis not present

## 2013-12-01 DIAGNOSIS — E1165 Type 2 diabetes mellitus with hyperglycemia: Principal | ICD-10-CM

## 2013-12-01 DIAGNOSIS — D649 Anemia, unspecified: Secondary | ICD-10-CM | POA: Diagnosis not present

## 2013-12-01 LAB — CBC
HEMATOCRIT: 38.5 % — AB (ref 39.0–52.0)
Hemoglobin: 12.7 g/dL — ABNORMAL LOW (ref 13.0–17.0)
MCHC: 33.1 g/dL (ref 30.0–36.0)
MCV: 92.3 fl (ref 78.0–100.0)
Platelets: 251 10*3/uL (ref 150.0–400.0)
RBC: 4.17 Mil/uL — ABNORMAL LOW (ref 4.22–5.81)
RDW: 13.4 % (ref 11.5–15.5)
WBC: 6.6 10*3/uL (ref 4.0–10.5)

## 2013-12-01 LAB — BASIC METABOLIC PANEL
BUN: 27 mg/dL — AB (ref 6–23)
CALCIUM: 9.7 mg/dL (ref 8.4–10.5)
CHLORIDE: 105 meq/L (ref 96–112)
CO2: 28 mEq/L (ref 19–32)
Creatinine, Ser: 1.3 mg/dL (ref 0.4–1.5)
GFR: 59.06 mL/min — ABNORMAL LOW (ref >60.00)
Glucose, Bld: 131 mg/dL — ABNORMAL HIGH (ref 70–99)
Potassium: 4.5 mEq/L (ref 3.5–5.1)
Sodium: 139 mEq/L (ref 135–145)

## 2013-12-01 LAB — HEMOGLOBIN A1C: Hgb A1c MFr Bld: 7.8 % — ABNORMAL HIGH (ref 4.6–6.5)

## 2013-12-01 NOTE — Progress Notes (Signed)
Quick Note:  A1c still slightly high at 7.8, needs better glucose control after meals. Anemia test better ______

## 2013-12-01 NOTE — Patient Instructions (Signed)
Try to click 0-07 min before eating if knowing what Carbs you are eating Please check blood sugars at least half the time about 2 hours after any meal and times per week on waking up.  Please bring blood sugar monitor to each visit  Probably need 1 click for larger pm meals or higher fat meals

## 2013-12-01 NOTE — Progress Notes (Signed)
Patient ID: Jonathan Hensley, male   DOB: 07/28/1940, 73 y.o.   MRN: [id number]   Reason for Appointment: Diabetes follow-up   History of Present Illness    Type 2 diabetes mellitus, date of diagnosis: 1994.   He has been on insulin for the last few years, starting with only bedtime NPH and subsequently progressing to basal bolus regimen Also taking glipizide and metformin long-term  The HbgA1c was highest in 2013 at 8.2  Recent history:  Because of more fluctuation in his blood sugars at all times, difficulty with compliance and inconvenience of the multiple insulin dose regimen as well as needing higher doses of insulin he was started on the V.-go pump in 05/2013. He has been using the 20 basal pump and also doing boluses at meals and snacks with carbohydrate counting as before Since using the pump his blood sugars have been generally more consistent and he is very pleased of his level of control and convenience of the pump. His compliance with mealtime insulin has been better also He has also eliminated low blood sugars which were happening periodically very fearful of these especially at night with taking his NPH insulin Current blood sugar patterns and problems:  Fasting blood sugars are fairly consistent and in a good range  Blood sugars may be good after breakfast but checking infrequently  Blood sugars in the afternoons are quite variable and he occasional high readings may be related to his forgetting to bolus when he is eating  Blood sugar before and after supper are also somewhat variable; however he is often checking his sugar about an hour after eating. He thinks he is consistent with carbohydrate counting for mealtime coverage but often taking insulin right after eating and not accounting for high fat meals.  Only rarely and will have a feeling of low blood sugar early morning and will take a glucose tablet  The insulin regimen is described as basal rate of 20 units on V- go  pump; premeal rapid analog usually carbohydrate coverage 1:10; usually 6 units 3 times a day   Difficulties with medication adherence: None recently  Proper timing of medications in relation to meals: Taking insulin bolus just after eating   Glucose monitoring: Accu-Chek, checking 3.6 times a day  Blood Glucose readings:   PREMEAL Breakfast Lunch Dinner  bedtime  Overall  Glucose range:  70-176   113-140   96-196   119-244   70-342   Mean/median:  130      158+/-49    POST-MEAL PC Breakfast PC Lunch PC Dinner  Glucose range:   86-342   127-299   Mean/median:   163   182    Hypoglycemia: None recently reported    Wt Readings from Last 3 Encounters:  12/01/13 228 lb 12.8 oz (103.783 kg)  09/01/13 227 lb 12.8 oz (103.329 kg)  08/18/13 221 lb (100.245 kg)   Meals: 2 meals per day and usually no breakfast.   Carbohydrate intake: low;  Sometimes eating out and usually lunch is the larger meal of the day, snacks peanuts.  Physical activity: exercise: None recently since his back surgery and sciatica pain  Peripheral neuropathy: Yes.  Diabetic nephropathy: Occasional mild microalbuminuria     Lab Results  Component Value Date   HGBA1C 7.8* 09/01/2013   HGBA1C 7.3* 06/02/2013   HGBA1C 7.8* 12/03/2012   Lab Results  Component Value Date   MICROALBUR 4.2* 09/01/2013   LDLCALC 60 09/01/2013   CREATININE 1.0 09/01/2013  Medication List       This list is accurate as of: 12/01/13 10:19 AM.  Always use your most recent med list.               acetaminophen 500 MG tablet  Commonly known as:  TYLENOL  Take 500 mg by mouth every 8 (eight) hours as needed. pain     benazepril 40 MG tablet  Commonly known as:  LOTENSIN  Take 40 mg by mouth at bedtime.     cyclobenzaprine 10 MG tablet  Commonly known as:  FLEXERIL  Take 1 tablet (10 mg total) by mouth 3 (three) times daily as needed for muscle spasms.     ELIQUIS 5 MG Tabs tablet  Generic drug:  apixaban  Take 5 mg  by mouth 2 (two) times daily.     ezetimibe 10 MG tablet  Commonly known as:  ZETIA  Take 10 mg by mouth daily.     fluticasone 0.05 % cream  Commonly known as:  CUTIVATE  Apply topically 2 (two) times daily.     insulin aspart 100 UNIT/ML injection  Commonly known as:  novoLOG  Inject 20 Units into the skin once. Use with V-Go pump     ketoconazole 2 % cream  Commonly known as:  NIZORAL  Apply 1 application topically 2 (two) times daily.     metFORMIN 1000 MG tablet  Commonly known as:  GLUCOPHAGE  Take 1 tablet (1,000 mg total) by mouth 2 (two) times daily with a meal.     oxyCODONE-acetaminophen 5-325 MG per tablet  Commonly known as:  PERCOCET  Take 2 tablets by mouth every 4 (four) hours as needed.     oxyCODONE-acetaminophen 5-325 MG per tablet  Commonly known as:  PERCOCET/ROXICET  Take 1 tablet by mouth every 8 (eight) hours as needed for severe pain.     simvastatin 20 MG tablet  Commonly known as:  ZOCOR  Take 20 mg by mouth daily.     V-GO 20 Kit  USE ONE PER DAY        Allergies:  Allergies  Allergen Reactions  . Other Other (See Comments)    Beta or alpha blockers: severe dizziness    Past Medical History  Diagnosis Date  . History of prostate cancer   . Hypertension   . Diabetes mellitus   . Nephrolithiasis   . Diabetic retinopathy   . Bulging lumbar disc   . Dysrhythmia     Atrial Fibrillation  . Sleep apnea     uses cpap  . Arthritis     rheumatoid in his hands  . Cancer     Past Surgical History  Procedure Laterality Date  . Back surgery    . Brachytherapy    . Cystoscopy    . Left l4-l5 lumbar laminotomy and microdiskectomy with  07/14/2008    . Scleral buckle, laser photocoagulation, and anterior chamber  01/04/2009  . Bilateral l4-5 lumbar decompression including bilateral  11/2008  . Lithotripsy    . Appendectomy    . Rotator cuff surgery    . Colonoscopy    . Lumbar laminectomy/decompression microdiscectomy Left  08/18/2013    Procedure: Left Lumbar Five-Sacral One Microdiskectomy;  Surgeon: [name], MD;  Location: [facility];  Service: Neurosurgery;  Laterality: Left;  Left Lumbar Five-Sacral One Microdiskectomy    Family History  Problem Relation Age of Onset  . Heart failure Father   . Heart failure Mother   .  Heart failure Brother     Half brother.    Social History:  reports that he quit smoking about 41 years ago. He has never used smokeless tobacco. He reports that he does not drink alcohol or use illicit drugs.  Review of Systems  Hypertension:  Home blood pressure recently about 154/70s. Was advised to use an Omron monitor Blood pressure is very well controlled now with benazepril alone He is taking sodium bicarbonate for his history of kidney stones from urologist  HYPERLIPIDEMIA: Has been on simvastatin and Zetia  with good results  Lab Results  Component Value Date   CHOL 136 09/01/2013   HDL 48.30 09/01/2013   LDLCALC 60 09/01/2013   TRIG 141.0 09/01/2013   CHOLHDL 3 09/01/2013     He has had retinal detachment treated surgically in the past, followed by ophthalmologist periodically    Sleep apnea, is on CPAP   Anemia: This is mild and stable; stool Hemoccult  negative in 1/14   Examination:   BP 154/78  Pulse 64  Temp(Src) 98.3 F (36.8 C)  Resp 16  Ht _0  (1.778 m)  Wt 228 lb 12.8 oz (103.783 kg)  BMI 32.83 kg/m2  SpO2 97%  Body mass index is 32.83 kg/(m^2).   Repeat blood pressure normal at 124/70  Assesment:   1. Diabetes type 2, uncontrolled    The patient's diabetes control is overall fairly good  He also has had overall less fluctuation and much less hypoglycemia See history of present illness for detailed discussion of current blood sugar patterns, problems identified and day-to-day management He does tend to have high postprandial readings and this is either related to not being consistent with bolusing on time or not adjusting his bolus  based on what he is eating including higher fat meals Discussed day-to-day compliance with mealtime coverage and how to adjust his insulin Discussed timing and targets of blood sugar levels Have written 2 letters for his insurance approval for the V.-go pump which is apparently not covered We'll check his A1c   2. Hypertension: Well controlled. He will continue the same medications  Counseling time over 50% of today's 25 minute visit  Aralynn Brake 12/01/2013, 10:19 AM    Addendum  Office Visit on 12/01/2013  Component Date Value Ref Range Status  . Hemoglobin A1C 12/01/2013 7.8* 4.6 - 6.5 % Final   Glycemic Control Guidelines for People with Diabetes:Non Diabetic:  <6%Goal of Therapy: <7%Additional Action Suggested:  >8%   . Sodium 12/01/2013 139  135 - 145 mEq/L Final  . Potassium 12/01/2013 4.5  3.5 - 5.1 mEq/L Final  . Chloride 12/01/2013 105  96 - 112 mEq/L Final  . CO2 12/01/2013 28  19 - 32 mEq/L Final  . Glucose, Bld 12/01/2013 131* 70 - 99 mg/dL Final  . BUN 12/01/2013 27* 6 - 23 mg/dL Final  . Creatinine, Ser 12/01/2013 1.3  0.4 - 1.5 mg/dL Final  . Calcium 12/01/2013 9.7  8.4 - 10.5 mg/dL Final  . GFR 12/01/2013 59.06* >60.00 mL/min Final  . WBC 12/01/2013 6.6  4.0 - 10.5 K/uL Final  . RBC 12/01/2013 4.17* 4.22 - 5.81 Mil/uL Final  . Platelets 12/01/2013 251.0  150.0 - 400.0 K/uL Final  . Hemoglobin 12/01/2013 12.7* 13.0 - 17.0 g/dL Final  . HCT 12/01/2013 38.5* 39.0 - 52.0 % Final  . MCV 12/01/2013 92.3  78.0 - 100.0 fl Final  . MCHC 12/01/2013 33.1  30.0 - 36.0 g/dL Final  . RDW 12/01/2013  13.4  11.5 - 15.5 % Final

## 2013-12-10 ENCOUNTER — Other Ambulatory Visit: Payer: Self-pay | Admitting: *Deleted

## 2013-12-10 ENCOUNTER — Telehealth: Payer: Self-pay | Admitting: Endocrinology

## 2013-12-10 MED ORDER — GLUCOSE BLOOD VI STRP: ORAL_STRIP | Status: DC

## 2013-12-10 MED ORDER — KETOCONAZOLE 2 % EX CREA
1.0000 | TOPICAL_CREAM | Freq: Two times a day (BID) | CUTANEOUS | Status: DC
Start: 2013-12-10 — End: 2014-01-08

## 2013-12-10 MED ORDER — FREESTYLE LANCETS MISC
Status: DC
Start: 2013-12-10 — End: 2024-02-04

## 2013-12-10 NOTE — Telephone Encounter (Signed)
Pt lm requesting a call back from [place]

## 2013-12-11 ENCOUNTER — Telehealth: Payer: Self-pay | Admitting: *Deleted

## 2013-12-11 NOTE — Telephone Encounter (Signed)
V-Go kit was approved by [employer] Conf# [id number]

## 2013-12-23 DIAGNOSIS — H532 Diplopia: Secondary | ICD-10-CM | POA: Diagnosis not present

## 2014-01-06 DIAGNOSIS — E11339 Type 2 diabetes mellitus with moderate nonproliferative diabetic retinopathy without macular edema: Secondary | ICD-10-CM | POA: Diagnosis not present

## 2014-01-06 DIAGNOSIS — H35359 Cystoid macular degeneration, unspecified eye: Secondary | ICD-10-CM | POA: Diagnosis not present

## 2014-01-06 DIAGNOSIS — E1139 Type 2 diabetes mellitus with other diabetic ophthalmic complication: Secondary | ICD-10-CM | POA: Diagnosis not present

## 2014-01-06 DIAGNOSIS — E1165 Type 2 diabetes mellitus with hyperglycemia: Secondary | ICD-10-CM | POA: Diagnosis not present

## 2014-01-06 DIAGNOSIS — E11311 Type 2 diabetes mellitus with unspecified diabetic retinopathy with macular edema: Secondary | ICD-10-CM | POA: Diagnosis not present

## 2014-01-08 ENCOUNTER — Other Ambulatory Visit: Payer: Self-pay | Admitting: *Deleted

## 2014-01-08 MED ORDER — KETOCONAZOLE 2 % EX CREA: 1.0000 "application " | TOPICAL_CREAM | Freq: Two times a day (BID) | CUTANEOUS | Status: DC

## 2014-01-22 ENCOUNTER — Other Ambulatory Visit: Payer: Self-pay | Admitting: Endocrinology

## 2014-01-26 DIAGNOSIS — N200 Calculus of kidney: Secondary | ICD-10-CM | POA: Diagnosis not present

## 2014-01-26 DIAGNOSIS — C61 Malignant neoplasm of prostate: Secondary | ICD-10-CM | POA: Diagnosis not present

## 2014-01-27 DIAGNOSIS — E11311 Type 2 diabetes mellitus with unspecified diabetic retinopathy with macular edema: Secondary | ICD-10-CM | POA: Diagnosis not present

## 2014-01-27 DIAGNOSIS — E1139 Type 2 diabetes mellitus with other diabetic ophthalmic complication: Secondary | ICD-10-CM | POA: Diagnosis not present

## 2014-01-27 DIAGNOSIS — E11339 Type 2 diabetes mellitus with moderate nonproliferative diabetic retinopathy without macular edema: Secondary | ICD-10-CM | POA: Diagnosis not present

## 2014-02-19 DIAGNOSIS — Z23 Encounter for immunization: Secondary | ICD-10-CM | POA: Diagnosis not present

## 2014-02-23 DIAGNOSIS — L303 Infective dermatitis: Secondary | ICD-10-CM | POA: Diagnosis not present

## 2014-02-23 DIAGNOSIS — X32XXXD Exposure to sunlight, subsequent encounter: Secondary | ICD-10-CM | POA: Diagnosis not present

## 2014-02-23 DIAGNOSIS — L570 Actinic keratosis: Secondary | ICD-10-CM | POA: Diagnosis not present

## 2014-03-03 ENCOUNTER — Ambulatory Visit (INDEPENDENT_AMBULATORY_CARE_PROVIDER_SITE_OTHER): Payer: Medicare Other | Admitting: Endocrinology

## 2014-03-03 ENCOUNTER — Encounter: Payer: Self-pay | Admitting: Endocrinology

## 2014-03-03 VITALS — BP 161/80 | HR 70 | Temp 98.6°F | Resp 16 | Ht 70.0 in | Wt 231.0 lb

## 2014-03-03 DIAGNOSIS — E785 Hyperlipidemia, unspecified: Secondary | ICD-10-CM | POA: Diagnosis not present

## 2014-03-03 DIAGNOSIS — E1165 Type 2 diabetes mellitus with hyperglycemia: Secondary | ICD-10-CM

## 2014-03-03 DIAGNOSIS — IMO0002 Reserved for concepts with insufficient information to code with codable children: Secondary | ICD-10-CM

## 2014-03-03 LAB — URINALYSIS, ROUTINE W REFLEX MICROSCOPIC
Bilirubin Urine: NEGATIVE
HGB URINE DIPSTICK: NEGATIVE
Ketones, ur: NEGATIVE
Leukocytes, UA: NEGATIVE
NITRITE: NEGATIVE
RBC / HPF: NONE SEEN (ref 0–?)
SPECIFIC GRAVITY, URINE: 1.01 (ref 1.000–1.030)
TOTAL PROTEIN, URINE-UPE24: NEGATIVE
URINE GLUCOSE: 100 — AB
Urobilinogen, UA: 0.2 (ref 0.0–1.0)
WBC, UA: NONE SEEN (ref 0–?)
pH: 7 (ref 5.0–8.0)

## 2014-03-03 LAB — LIPID PANEL
CHOLESTEROL: 142 mg/dL (ref 0–200)
HDL: 51.9 mg/dL (ref >39.00)
LDL CALC: 58 mg/dL (ref 0–99)
NonHDL: 90.1
Total CHOL/HDL Ratio: 3
Triglycerides: 162 mg/dL — ABNORMAL HIGH (ref 0.0–149.0)
VLDL: 32.4 mg/dL (ref 0.0–40.0)

## 2014-03-03 LAB — COMPREHENSIVE METABOLIC PANEL
ALK PHOS: 44 U/L (ref 39–117)
ALT: 17 U/L (ref 0–53)
AST: 20 U/L (ref 0–37)
Albumin: 3.4 g/dL — ABNORMAL LOW (ref 3.5–5.2)
BUN: 29 mg/dL — ABNORMAL HIGH (ref 6–23)
CO2: 22 meq/L (ref 19–32)
CREATININE: 1.2 mg/dL (ref 0.4–1.5)
Calcium: 9.5 mg/dL (ref 8.4–10.5)
Chloride: 101 mEq/L (ref 96–112)
GFR: 65.53 mL/min (ref >60.00)
GLUCOSE: 253 mg/dL — AB (ref 70–99)
Potassium: 4.5 mEq/L (ref 3.5–5.1)
SODIUM: 137 meq/L (ref 135–145)
TOTAL PROTEIN: 7.2 g/dL (ref 6.0–8.3)
Total Bilirubin: 0.4 mg/dL (ref 0.2–1.2)

## 2014-03-03 LAB — MICROALBUMIN / CREATININE URINE RATIO
Creatinine,U: 77.1 mg/dL
MICROALB UR: 1.2 mg/dL (ref 0.0–1.9)
Microalb Creat Ratio: 1.6 mg/g (ref 0.0–30.0)

## 2014-03-03 LAB — HEMOGLOBIN A1C: Hgb A1c MFr Bld: 7.6 % — ABNORMAL HIGH (ref 4.6–6.5)

## 2014-03-03 NOTE — Patient Instructions (Signed)
Bring BP meter to next visit  Take Bolus 10 min before eating if possible

## 2014-03-03 NOTE — Progress Notes (Signed)
Quick Note:  Please let patient know that the A1c is sl. Better but sugar was 253   ______

## 2014-03-03 NOTE — Progress Notes (Signed)
Patient ID: MAYES SANGIOVANNI, male   DOB: 1941-04-11, 73 y.o.   MRN: [id number]   Reason for Appointment: Diabetes follow-up   History of Present Illness    Type 2 diabetes mellitus, date of diagnosis: 1994.   He has been on insulin for the last few years, starting with only bedtime NPH and subsequently progressing to basal bolus regimen Also taking glipizide and metformin long-term  The HbgA1c was highest in 2013 at 8.2 Because of more fluctuation in his blood sugars at all times, difficulty with compliance and inconvenience of the multiple insulin dose regimen as well as needing higher doses of insulin he was started on the V.-go pump in 05/2013  Recent history:   He has been using the 20 basal on his V-go pump and also doing boluses at meals and snacks with carbohydrate counting  Since using the pump his blood sugars have been somewhat more consistent and he is liking the convenience of the pump.  His compliance with mealtime insulin has been better overall but still not consistent He again said that he may forget to take his bolus at mealtimes He tries to take the bolus after eating generally He has  reduced his carbohydrate intake especially at lunch  Current blood sugar patterns and problems:  Fasting blood sugars are fairly consistent and usually in a good range  Blood sugars usually not checked after breakfast, recently was 203  Blood sugars around lunchtime or variable and are often checking before eating  Blood sugars in the afternoons are quite variable with periodic high readings up to 252  Again high readings after meals may be related to his forgetting to bolus when he is eating  Blood sugar before and after supper are also somewhat variable and on an average the highest  Has had one episode of relatively low sugar with postprandial bolusing at night  He thinks that rarely he will have a low sugar during the night but is not documented  The insulin regimen is  described as basal rate of 20 units on V- go pump; premeal rapid analog usually carbohydrate coverage 1:10; usually 4-6 units 3 times a day   Difficulties with medication adherence: Bolusing for meals  Proper timing of medications in relation to meals: Taking insulin bolus just after eating   Glucose monitoring: Accu-Chek, checking 4.4 times a day  Blood Glucose readings:   PREMEAL Breakfast Lunch Dinner Bedtime Overall  Glucose range:  76-210   114-199   74-201   62-243    Mean/median:  130    181   158  154     Wt Readings from Last 3 Encounters:  03/03/14 231 lb (104.781 kg)  12/01/13 228 lb 12.8 oz (103.783 kg)  09/01/13 227 lb 12.8 oz (103.329 kg)   Meals: 2 meals per day and usually no breakfast.   Carbohydrate intake: low;  Sometimes eating out and usually lunch is the larger meal of the day, snacks peanuts.  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  Diabetic nephropathy: Occasional mild microalbuminuria     Lab Results  Component Value Date   HGBA1C 7.8* 12/01/2013   HGBA1C 7.8* 09/01/2013   HGBA1C 7.3* 06/02/2013   Lab Results  Component Value Date   MICROALBUR 4.2* 09/01/2013   LDLCALC 60 09/01/2013   CREATININE 1.3 12/01/2013         Medication List       This list is accurate as of:  03/03/14 10:16 AM.  Always use your most recent med list.               acetaminophen 500 MG tablet  Commonly known as:  TYLENOL  Take 500 mg by mouth every 8 (eight) hours as needed. pain     benazepril 40 MG tablet  Commonly known as:  LOTENSIN  Take 40 mg by mouth at bedtime.     ELIQUIS 5 MG Tabs tablet  Generic drug:  apixaban  Take 5 mg by mouth 2 (two) times daily.     ezetimibe 10 MG tablet  Commonly known as:  ZETIA  Take 10 mg by mouth daily.     fluticasone 0.05 % cream  Commonly known as:  CUTIVATE  Apply topically 2 (two) times daily.     freestyle lancets  Use as instructed to check blood sugar 4  times per day dx code 250.00     glucose blood test strip  Commonly known as:  FREESTYLE LITE  Use as instructed to check blood sugar 4 times day dx code 250.00     insulin aspart 100 UNIT/ML injection  Commonly known as:  novoLOG  Inject 20 Units into the skin once. Use with V-Go pump     ketoconazole 2 % cream  Commonly known as:  NIZORAL  Apply 1 application topically 2 (two) times daily. Use as directed     metFORMIN 1000 MG tablet  Commonly known as:  GLUCOPHAGE  TAKE 1 TABLET TWO TIMES DAILY WITH A MEAL     simvastatin 20 MG tablet  Commonly known as:  ZOCOR  Take 20 mg by mouth daily.     V-GO 20 Kit  USE ONE PER DAY        Allergies:  Allergies  Allergen Reactions  . Other Other (See Comments)    Beta or alpha blockers: severe dizziness    Past Medical History  Diagnosis Date  . History of prostate cancer   . Hypertension   . Diabetes mellitus   . Nephrolithiasis   . Diabetic retinopathy   . Bulging lumbar disc   . Dysrhythmia     Atrial Fibrillation  . Sleep apnea     uses cpap  . Arthritis     rheumatoid in his hands  . Cancer     Past Surgical History  Procedure Laterality Date  . Back surgery    . Brachytherapy    . Cystoscopy    . Left l4-l5 lumbar laminotomy and microdiskectomy with  07/14/2008    . Scleral buckle, laser photocoagulation, and anterior chamber  01/04/2009  . Bilateral l4-5 lumbar decompression including bilateral  11/2008  . Lithotripsy    . Appendectomy    . Rotator cuff surgery    . Colonoscopy    . Lumbar laminectomy/decompression microdiscectomy Left 08/18/2013    Procedure: Left Lumbar Five-Sacral One Microdiskectomy;  Surgeon: [name], MD;  Location: [facility];  Service: Neurosurgery;  Laterality: Left;  Left Lumbar Five-Sacral One Microdiskectomy    Family History  Problem Relation Age of Onset  . Heart failure Father   . Heart failure Mother   . Heart failure Brother     Half brother.     Social History:  reports that he quit smoking about 41 years ago. He has never used smokeless tobacco. He reports that he does not drink alcohol or use illicit drugs.  Review of Systems  Hypertension:   Home blood pressure recently  about 161-096 systolic.  Was advised to use an Omron monitor, using a wrist monitor Blood pressure is being treated now with benazepril alone  He is taking sodium bicarbonate for his history of kidney stones from urologist  HYPERLIPIDEMIA: Has been on simvastatin and Zetia  with good results  Lab Results  Component Value Date   CHOL 136 09/01/2013   HDL 48.30 09/01/2013   LDLCALC 60 09/01/2013   TRIG 141.0 09/01/2013   CHOLHDL 3 09/01/2013     He has had retinal detachment treated surgically in the past, followed by ophthalmologist periodically    Sleep apnea, is on CPAP   Anemia: This is mild and stable; stool Hemoccult  negative in 1/14  Lab Results  Component Value Date   WBC 6.6 12/01/2013   HGB 12.7* 12/01/2013   HCT 38.5* 12/01/2013   MCV 92.3 12/01/2013   PLT 251.0 12/01/2013      Examination:   BP 161/80  Pulse 70  Temp(Src) 98.6 F (37 C)  Resp 16  Ht _0  (1.778 m)  Wt 231 lb (104.781 kg)  BMI 33.15 kg/m2  SpO2 98%  Body mass index is 33.15 kg/(m^2).   Repeat blood pressure 150/76  Assesment:   1. Diabetes type 2, uncontrolled    The patient's diabetes control is overall fairly good  He also has had some  fluctuation of his blood sugars after meals See history of present illness for detailed discussion of current blood sugar patterns, problems identified and day-to-day management He does tend to have high postprandial readings at times and this is either related to not being consistent with bolusing on time or not being able to calculate his bolus appropriately based on what he is eating  Discussed timing of mealtime insulin and he may take the bolus up to 10-15 minutes before eating; also can split the bolus is not sure  how much he will eat  We will check his A1c again today  2. Hypertension: Well controlled at home and has some whitecoat hypertension.     Sujey Gundry 03/03/2014, 10:16 AM

## 2014-03-05 NOTE — Telephone Encounter (Signed)
error 

## 2014-03-10 DIAGNOSIS — E11331 Type 2 diabetes mellitus with moderate nonproliferative diabetic retinopathy with macular edema: Secondary | ICD-10-CM | POA: Diagnosis not present

## 2014-03-10 DIAGNOSIS — E11321 Type 2 diabetes mellitus with mild nonproliferative diabetic retinopathy with macular edema: Secondary | ICD-10-CM | POA: Diagnosis not present

## 2014-04-09 ENCOUNTER — Other Ambulatory Visit: Payer: Self-pay | Admitting: Endocrinology

## 2014-04-21 DIAGNOSIS — E11321 Type 2 diabetes mellitus with mild nonproliferative diabetic retinopathy with macular edema: Secondary | ICD-10-CM | POA: Diagnosis not present

## 2014-04-26 DIAGNOSIS — Z7983 Long term (current) use of bisphosphonates: Secondary | ICD-10-CM | POA: Diagnosis not present

## 2014-04-26 DIAGNOSIS — B349 Viral infection, unspecified: Secondary | ICD-10-CM | POA: Diagnosis not present

## 2014-04-26 DIAGNOSIS — Z79899 Other long term (current) drug therapy: Secondary | ICD-10-CM | POA: Diagnosis not present

## 2014-04-26 DIAGNOSIS — R079 Chest pain, unspecified: Secondary | ICD-10-CM | POA: Diagnosis not present

## 2014-05-05 DIAGNOSIS — Z6833 Body mass index (BMI) 33.0-33.9, adult: Secondary | ICD-10-CM | POA: Diagnosis not present

## 2014-05-05 DIAGNOSIS — M5136 Other intervertebral disc degeneration, lumbar region: Secondary | ICD-10-CM | POA: Diagnosis not present

## 2014-05-05 DIAGNOSIS — M47816 Spondylosis without myelopathy or radiculopathy, lumbar region: Secondary | ICD-10-CM | POA: Diagnosis not present

## 2014-05-05 DIAGNOSIS — M545 Low back pain: Secondary | ICD-10-CM | POA: Diagnosis not present

## 2014-05-05 DIAGNOSIS — M549 Dorsalgia, unspecified: Secondary | ICD-10-CM | POA: Diagnosis not present

## 2014-05-21 DIAGNOSIS — H532 Diplopia: Secondary | ICD-10-CM | POA: Diagnosis not present

## 2014-05-21 DIAGNOSIS — H8143 Vertigo of central origin, bilateral: Secondary | ICD-10-CM | POA: Diagnosis not present

## 2014-05-21 DIAGNOSIS — H5500 Unspecified nystagmus: Secondary | ICD-10-CM | POA: Diagnosis not present

## 2014-06-02 DIAGNOSIS — H43811 Vitreous degeneration, right eye: Secondary | ICD-10-CM | POA: Diagnosis not present

## 2014-06-02 DIAGNOSIS — E11321 Type 2 diabetes mellitus with mild nonproliferative diabetic retinopathy with macular edema: Secondary | ICD-10-CM | POA: Diagnosis not present

## 2014-06-02 DIAGNOSIS — H43813 Vitreous degeneration, bilateral: Secondary | ICD-10-CM | POA: Diagnosis not present

## 2014-06-02 DIAGNOSIS — H35372 Puckering of macula, left eye: Secondary | ICD-10-CM | POA: Diagnosis not present

## 2014-06-02 DIAGNOSIS — E11331 Type 2 diabetes mellitus with moderate nonproliferative diabetic retinopathy with macular edema: Secondary | ICD-10-CM | POA: Diagnosis not present

## 2014-06-02 DIAGNOSIS — H35373 Puckering of macula, bilateral: Secondary | ICD-10-CM | POA: Diagnosis not present

## 2014-06-02 DIAGNOSIS — H35371 Puckering of macula, right eye: Secondary | ICD-10-CM | POA: Diagnosis not present

## 2014-06-02 DIAGNOSIS — H43812 Vitreous degeneration, left eye: Secondary | ICD-10-CM | POA: Diagnosis not present

## 2014-06-02 LAB — HM DIABETES EYE EXAM

## 2014-06-03 ENCOUNTER — Encounter: Payer: Self-pay | Admitting: Endocrinology

## 2014-06-03 ENCOUNTER — Other Ambulatory Visit (INDEPENDENT_AMBULATORY_CARE_PROVIDER_SITE_OTHER): Payer: Medicare Other

## 2014-06-03 ENCOUNTER — Ambulatory Visit (INDEPENDENT_AMBULATORY_CARE_PROVIDER_SITE_OTHER): Payer: Medicare Other | Admitting: Endocrinology

## 2014-06-03 VITALS — BP 128/72 | Temp 98.1°F | Wt 230.6 lb

## 2014-06-03 DIAGNOSIS — I10 Essential (primary) hypertension: Secondary | ICD-10-CM | POA: Diagnosis not present

## 2014-06-03 DIAGNOSIS — D638 Anemia in other chronic diseases classified elsewhere: Secondary | ICD-10-CM | POA: Diagnosis not present

## 2014-06-03 DIAGNOSIS — IMO0002 Reserved for concepts with insufficient information to code with codable children: Secondary | ICD-10-CM

## 2014-06-03 DIAGNOSIS — E1165 Type 2 diabetes mellitus with hyperglycemia: Secondary | ICD-10-CM | POA: Diagnosis not present

## 2014-06-03 LAB — BASIC METABOLIC PANEL
BUN: 24 mg/dL — ABNORMAL HIGH (ref 6–23)
CALCIUM: 9.4 mg/dL (ref 8.4–10.5)
CO2: 31 mEq/L (ref 19–32)
Chloride: 104 mEq/L (ref 96–112)
Creatinine, Ser: 1.09 mg/dL (ref 0.40–1.50)
GFR: 70.36 mL/min (ref >60.00)
GLUCOSE: 158 mg/dL — AB (ref 70–99)
Potassium: 4.4 mEq/L (ref 3.5–5.1)
SODIUM: 139 meq/L (ref 135–145)

## 2014-06-03 LAB — HEMOGLOBIN A1C: Hgb A1c MFr Bld: 8.1 % — ABNORMAL HIGH (ref 4.6–6.5)

## 2014-06-03 NOTE — Addendum Note (Signed)
Addended by: [name] on: 06/03/2014 01:05 PM   Modules accepted: Orders, Level of Service

## 2014-06-03 NOTE — Patient Instructions (Addendum)
FOR EVERY MEAL CLICK the pump 2 TIMES FOR EVERY 10 grams Carbs RIGHT AT MEAL TIME, preferrably before the meal  Try to keep 2 hour sugar after meal under 180  For high sugars click once for sugars over 180 and 2 if over 240

## 2014-06-03 NOTE — Progress Notes (Signed)
Patient ID: Jonathan Hensley, male   DOB: 11-10-1940, 74 y.o.   MRN: [id number]   Reason for Appointment: Diabetes follow-up   History of Present Illness    Type 2 diabetes mellitus, date of diagnosis: 1994.   He has been on insulin for the last few years, starting with only bedtime NPH and subsequently progressing to basal bolus regimen Also taking glipizide and metformin long-term  The HbgA1c was highest in 2013 at 8.2 Because of more fluctuation in his blood sugars at all times, difficulty with compliance and inconvenience of the multiple insulin dose regimen as well as needing higher doses of insulin he was started on the V.-go pump in 05/2013  Recent history:  He is using the 20 basal on his V-go pump and also had been instructed on boluses at meals and snacks with carbohydrate counting  He now sees that he is not bolusing with his pump right at mealtimes but will wait till after eating to check his sugar and then bolus.  He is giving somewhat conflicting answers about what exactly he is doing with his boluses. He knows how to count carbohydrates and is still using 1:10 coverage Most of his postprandial readings are relatively higher especially in the evenings He believes he may forget to take his bolus at lunchtime especially although may not always have carbohydrate at that meal  Current blood sugar patterns and problems:  Fasting blood sugars are somewhat variable although on an average fairly well controlled  No hypoglycemia at any time  Blood sugars are usually averaging around 190 midday and about the same early evening  Has significant variability of his blood sugars in the evenings after supper  Still checking his blood sugars about 4 times a day   The insulin regimen is described as basal rate of 20 units on V- go pump; boluses with carbohydrate coverage 1:10; usually 4-6 units 3 times a day   Difficulties with medication adherence: Bolusing for meals  Proper timing of  medications in relation to meals: Taking insulin bolus just after eating   Glucose monitoring: Accu-Chek, checking 4.4 times a day  Blood Glucose readings:   PRE-MEAL Breakfast  2-4 PM   6 PM   8-10 PM  Overall  Glucose range:  94-176   133-216   133-111   134-349  85-349   Mean/median:  140      163     Wt Readings from Last 3 Encounters:  06/03/14 230 lb 9.6 oz (104.599 kg)  03/03/14 231 lb (104.781 kg)  12/01/13 228 lb 12.8 oz (103.783 kg)   Meals: 2 meals per day and usually no breakfast.   Carbohydrate intake: low;  Sometimes eating out and usually lunch is the larger meal of the day, snacks peanuts. 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  Diabetic nephropathy: Occasional mild microalbuminuria     Lab Results  Component Value Date   HGBA1C 7.6* 03/03/2014   HGBA1C 7.8* 12/01/2013   HGBA1C 7.8* 09/01/2013   Lab Results  Component Value Date   MICROALBUR 1.2 03/03/2014   LDLCALC 58 03/03/2014   CREATININE 1.2 03/03/2014         Medication List       This list is accurate as of: 06/03/14 10:49 AM.  Always use your most recent med list.               acetaminophen 500 MG tablet  Commonly known as:  TYLENOL  Take 500 mg by mouth every 8 (eight) hours as needed. pain     benazepril 40 MG tablet  Commonly known as:  LOTENSIN  Take 40 mg by mouth at bedtime.     ELIQUIS 5 MG Tabs tablet  Generic drug:  apixaban  Take 5 mg by mouth 2 (two) times daily.     ezetimibe 10 MG tablet  Commonly known as:  ZETIA  Take 10 mg by mouth daily.     fluticasone 0.05 % cream  Commonly known as:  CUTIVATE  Apply topically 2 (two) times daily.     freestyle lancets  Use as instructed to check blood sugar 4 times per day dx code 250.00     glucose blood test strip  Commonly known as:  FREESTYLE LITE  Use as instructed to check blood sugar 4 times day dx code 250.00     insulin aspart 100  UNIT/ML injection  Commonly known as:  NOVOLOG  Use upto 56 units in V-go pump daily as directed     ketoconazole 2 % cream  Commonly known as:  NIZORAL  Apply 1 application topically 2 (two) times daily. Use as directed     metFORMIN 1000 MG tablet  Commonly known as:  GLUCOPHAGE  TAKE 1 TABLET TWO TIMES DAILY WITH A MEAL     simvastatin 20 MG tablet  Commonly known as:  ZOCOR  Take 20 mg by mouth daily.     V-GO 20 Kit  USE ONE PER DAY        Allergies:  Allergies  Allergen Reactions  . Other Other (See Comments)    Beta or alpha blockers: severe dizziness    Past Medical History  Diagnosis Date  . History of prostate cancer   . Hypertension   . Diabetes mellitus   . Nephrolithiasis   . Diabetic retinopathy   . Bulging lumbar disc   . Dysrhythmia     Atrial Fibrillation  . Sleep apnea     uses cpap  . Arthritis     rheumatoid in his hands  . Cancer     Past Surgical History  Procedure Laterality Date  . Back surgery    . Brachytherapy    . Cystoscopy    . Left l4-l5 lumbar laminotomy and microdiskectomy with  07/14/2008    . Scleral buckle, laser photocoagulation, and anterior chamber  01/04/2009  . Bilateral l4-5 lumbar decompression including bilateral  11/2008  . Lithotripsy    . Appendectomy    . Rotator cuff surgery    . Colonoscopy    . Lumbar laminectomy/decompression microdiscectomy Left 08/18/2013    Procedure: Left Lumbar Five-Sacral One Microdiskectomy;  Surgeon: [name], MD;  Location: [facility];  Service: Neurosurgery;  Laterality: Left;  Left Lumbar Five-Sacral One Microdiskectomy    Family History  Problem Relation Age of Onset  . Heart failure Father   . Heart failure Mother   . Heart failure Brother     Half brother.    Social History:  reports that he quit smoking about 41 years ago. He has never used smokeless tobacco. He reports that he does not drink alcohol or use illicit drugs.  Review of  Systems  Hypertension:   Home blood pressure has been about 174-081 systolic.   Blood pressure is being treated now with benazepril alone  He is taking sodium bicarbonate for his history of kidney stones from urologist  HYPERLIPIDEMIA:  Has been on simvastatin and Zetia  with good results  Lab Results  Component Value Date   CHOL 142 03/03/2014   HDL 51.90 03/03/2014   LDLCALC 58 03/03/2014   TRIG 162.0* 03/03/2014   CHOLHDL 3 03/03/2014     He has had retinal detachment treated surgically in the past, followed by ophthalmologist and still having difficulties including diplopia  Sleep apnea, is on CPAP   Anemia: This is mild and stable; stool Hemoccult  negative in 1/14  Lab Results  Component Value Date   WBC 6.6 12/01/2013   HGB 12.7* 12/01/2013   HCT 38.5* 12/01/2013   MCV 92.3 12/01/2013   PLT 251.0 12/01/2013      Examination:   BP 128/72 mmHg  Temp(Src) 98.1 F (36.7 C) (Oral)  Wt 230 lb 9.6 oz (104.599 kg)  Body mass index is 33.09 kg/(m^2).      Assesment:   1. Diabetes type 2, uncontrolled    The patient's diabetes control is overall inconsistent with fluctuating blood sugars after meals See history of present illness for detailed discussion of current blood sugar patterns, problems identified and day-to-day management Although his fasting blood sugars are averaging about 140 they are somewhat variable He appears to be very inconsistent in covering his meals and most likely is taking boluses only after eating when the blood sugar goes up Discussed basic principles of mealtime boluses and how to calculate the boluses for carbohydrates and high sugars He was advised to check readings about 2 hours after meals to help adjust his mealtime boluses  May still need coverage for breakfast if his postprandial reading is high, currently not taking any boluses if his carbohydrate intake is less than 15 g  We will check his A1c again today  2. Hypertension: Well  controlled at home and will continue Lotensin  3.  History of anemia: We will need periodic follow-up   Counseling time over 50% of today's 25 minute visit  Vittoria Noreen 06/03/2014, 10:49 AM

## 2014-06-03 NOTE — Progress Notes (Signed)
Pre visit review using our clinic review tool, if applicable. No additional management support is needed unless otherwise documented below in the visit note. 

## 2014-06-04 NOTE — Progress Notes (Signed)
Quick Note:  Please let patient know that the A1c is higher at 8.1 and he should follow instructions for consistently taking his bolus clicks before eating  ______

## 2014-07-02 ENCOUNTER — Emergency Department (HOSPITAL_COMMUNITY)
Admission: EM | Admit: 2014-07-02 | Discharge: 2014-07-02 | Disposition: A | Discharge status: Home / Self Care | Payer: Medicare Other | Attending: Emergency Medicine | Admitting: Emergency Medicine

## 2014-07-02 ENCOUNTER — Encounter (HOSPITAL_COMMUNITY): Payer: Self-pay

## 2014-07-02 DIAGNOSIS — Z79899 Other long term (current) drug therapy: Secondary | ICD-10-CM | POA: Diagnosis not present

## 2014-07-02 DIAGNOSIS — Z9981 Dependence on supplemental oxygen: Secondary | ICD-10-CM | POA: Diagnosis not present

## 2014-07-02 DIAGNOSIS — M545 Low back pain, unspecified: Secondary | ICD-10-CM

## 2014-07-02 DIAGNOSIS — Z7902 Long term (current) use of antithrombotics/antiplatelets: Secondary | ICD-10-CM | POA: Diagnosis not present

## 2014-07-02 DIAGNOSIS — G4730 Sleep apnea, unspecified: Secondary | ICD-10-CM | POA: Insufficient documentation

## 2014-07-02 DIAGNOSIS — Z794 Long term (current) use of insulin: Secondary | ICD-10-CM | POA: Insufficient documentation

## 2014-07-02 DIAGNOSIS — Z9889 Other specified postprocedural states: Secondary | ICD-10-CM | POA: Insufficient documentation

## 2014-07-02 DIAGNOSIS — I10 Essential (primary) hypertension: Secondary | ICD-10-CM | POA: Insufficient documentation

## 2014-07-02 DIAGNOSIS — Z8546 Personal history of malignant neoplasm of prostate: Secondary | ICD-10-CM | POA: Insufficient documentation

## 2014-07-02 DIAGNOSIS — E119 Type 2 diabetes mellitus without complications: Secondary | ICD-10-CM | POA: Diagnosis not present

## 2014-07-02 DIAGNOSIS — Z7951 Long term (current) use of inhaled steroids: Secondary | ICD-10-CM | POA: Insufficient documentation

## 2014-07-02 DIAGNOSIS — Z859 Personal history of malignant neoplasm, unspecified: Secondary | ICD-10-CM | POA: Insufficient documentation

## 2014-07-02 DIAGNOSIS — R262 Difficulty in walking, not elsewhere classified: Secondary | ICD-10-CM | POA: Diagnosis not present

## 2014-07-02 DIAGNOSIS — M1990 Unspecified osteoarthritis, unspecified site: Secondary | ICD-10-CM | POA: Insufficient documentation

## 2014-07-02 DIAGNOSIS — I4891 Unspecified atrial fibrillation: Secondary | ICD-10-CM | POA: Insufficient documentation

## 2014-07-02 DIAGNOSIS — Z87891 Personal history of nicotine dependence: Secondary | ICD-10-CM | POA: Insufficient documentation

## 2014-07-02 DIAGNOSIS — Z87442 Personal history of urinary calculi: Secondary | ICD-10-CM | POA: Diagnosis not present

## 2014-07-02 DIAGNOSIS — M549 Dorsalgia, unspecified: Secondary | ICD-10-CM | POA: Diagnosis not present

## 2014-07-02 MED ORDER — TRAMADOL HCL 50 MG PO TABS: 50.0000 mg | ORAL_TABLET | Freq: Four times a day (QID) | ORAL | Status: DC | PRN

## 2014-07-02 NOTE — ED Provider Notes (Signed)
CSN: [id number]     Arrival date & time 07/02/14  0818 History  This chart was scribed for  T , MD by Joshua Chen, ED Scribe. This patient was seen in room [facility]10/[facility]10 and the patient's care was started at 8:43 AM.    Chief Complaint  Patient presents with  . Back Pain   The history is provided by the patient. No language interpreter was used.    HPI Comments: Jonathan Hensley is a 74 y.o. male with history of back pain who presents to the Emergency Department complaining of low back pain worsening at 0300 today. He states this episode of pain began 4 days ago and it was previously improving with Tramadol. He states he also has Flexeril at home. He states he has not taken anything today. He states pain is worse with movement and it does not really bother him when he is still, but notes spasms that are always present. He locates it to his left side initially this morning but states it affects the right side now. He states it feels like prior back pains. He states he has had 3 back surgeries including a disc replacement. He denies weakness, numbness, fever, dysuria, incontinence, chest pain, or abdominal pain.  Neurosurgeon: Dr. Nudelman  Past Medical History  Diagnosis Date  . History of prostate cancer   . Hypertension   . Diabetes mellitus   . Nephrolithiasis   . Diabetic retinopathy   . Bulging lumbar disc   . Dysrhythmia     Atrial Fibrillation  . Sleep apnea     uses cpap  . Arthritis     rheumatoid in his hands  . Cancer    Past Surgical History  Procedure Laterality Date  . Back surgery    . Brachytherapy    . Cystoscopy    . Left l4-l5 lumbar laminotomy and microdiskectomy with  07/14/2008    . Scleral buckle, laser photocoagulation, and anterior chamber  01/04/2009  . Bilateral l4-5 lumbar decompression including bilateral  11/2008  . Lithotripsy    . Appendectomy    . Rotator cuff surgery    . Colonoscopy    . Lumbar laminectomy/decompression  microdiscectomy Left 08/18/2013    Procedure: Left Lumbar Five-Sacral One Microdiskectomy;  Surgeon: Robert W Nudelman, MD;  Location: [facility];  Service: Neurosurgery;  Laterality: Left;  Left Lumbar Five-Sacral One Microdiskectomy   Family History  Problem Relation Age of Onset  . Heart failure Father   . Heart failure Mother   . Heart failure Brother     Half brother.   History  Substance Use Topics  . Smoking status: Former Smoker    Quit date: 07/12/1972  . Smokeless tobacco: Never Used  . Alcohol Use: No    Review of Systems  Constitutional: Negative for fever.  Cardiovascular: Negative for chest pain.  Gastrointestinal: Negative for abdominal pain.  Genitourinary: Negative for dysuria and hematuria.  Musculoskeletal: Positive for back pain.  Neurological: Negative for weakness and numbness.  All other systems reviewed and are negative.     Allergies  Other  Home Medications   Prior to Admission medications   Medication Sig Start Date End Date Taking? Authorizing Provider  acetaminophen (TYLENOL) 500 MG tablet Take 500 mg by mouth every 8 (eight) hours as needed. pain    Historical Provider, MD  apixaban (ELIQUIS) 5 MG TABS tablet Take 5 mg by mouth 2 (two) times daily.    Historical Provider, MD  benazepril (LOTENSIN)   40 MG tablet Take 40 mg by mouth at bedtime.     Historical Provider, MD  ezetimibe (ZETIA) 10 MG tablet Take 10 mg by mouth daily.    Historical Provider, MD  fluticasone (CUTIVATE) 0.05 % cream Apply topically 2 (two) times daily. 03/24/13   Ajay Kumar, MD  glucose blood (FREESTYLE LITE) test strip Use as instructed to check blood sugar 4 times day dx code 250.00 12/10/13   Ajay Kumar, MD  insulin aspart (NOVOLOG) 100 UNIT/ML injection Use upto 56 units in V-go pump daily as directed 04/11/14   Ajay Kumar, MD  Insulin Disposable Pump (V-GO 20) KIT USE ONE PER DAY 10/30/13   Ajay Kumar, MD  ketoconazole (NIZORAL) 2 % cream Apply 1 application  topically 2 (two) times daily. Use as directed 01/08/14   Ajay Kumar, MD  Lancets (FREESTYLE) lancets Use as instructed to check blood sugar 4 times per day dx code 250.00 12/10/13   Ajay Kumar, MD  metFORMIN (GLUCOPHAGE) 1000 MG tablet TAKE 1 TABLET TWO TIMES DAILY WITH A MEAL 01/22/14   Ajay Kumar, MD  simvastatin (ZOCOR) 20 MG tablet Take 20 mg by mouth daily.    Historical Provider, MD   BP 153/78 mmHg  Pulse 70  Temp(Src) 98.1 F (36.7 C) (Oral)  Resp 20  Ht 5' 10.5" (1.791 m)  Wt 222 lb (100.699 kg)  BMI 31.39 kg/m2  SpO2 97% Physical Exam  Constitutional: He is oriented to person, place, and time. He appears well-developed and well-nourished.  HENT:  Head: Normocephalic and atraumatic.  Right Ear: External ear normal.  Left Ear: External ear normal.  Eyes: Right eye exhibits no discharge. Left eye exhibits no discharge.  Neck: Neck supple.  Cardiovascular: Normal rate, regular rhythm and normal heart sounds.   Pulmonary/Chest: Effort normal and breath sounds normal.  Abdominal: Soft. He exhibits no distension. There is no tenderness.  Musculoskeletal:  Decreased ROM of lumbar spine tenderness over right lower lumbar spine   Neurological: He is alert and oriented to person, place, and time. He has normal reflexes.  Normal strength and sensation in lower extremities bilaterally  Skin: Skin is warm and dry.  Psychiatric: He has a normal mood and affect.  Nursing note and vitals reviewed.   ED Course  Procedures (including critical care time)  DIAGNOSTIC STUDIES: Oxygen Saturation is 97% on room air, normal by my interpretation.    COORDINATION OF CARE: 9:07 AM Discussed treatment plan with patient at beside, including Tramadol and follow up with Dr. Nudelman. The patient agrees with the plan and has no further questions at this time.   Labs Review Labs Reviewed - No data to display  Imaging Review No results found.   EKG Interpretation None      MDM   Final  diagnoses:  Acute low back pain    Patient is neurologically intact. No warning signs such as fever, weakness, numbness, or incontinence. No abdominal symptoms. Given his long history of spine issues I highly doubt more serious pathology such as aortic aneurysm, infection, etc. We discussed pain control and emerge from it such as IV or oral medication. Patient states he would rather just have more tramadol written as a prescription as he is almost out and does not want to pay the extra cost of having medicines given to him in the ER. He feels like his pain is mostly a spasm but declines Flexeril because he knows it can cause dizziness he states is not worth it.   Prefers to go home and will call his neurosurgeon when he goes home. He has not acute neurologic emergency would suggest needing an emergent MRI at this time. Discussed strict return precautions.  I personally performed the services described in this documentation, which was scribed in my presence. The recorded information has been reviewed and is accurate.   Ephraim Hamburger, MD 07/02/14 [phone]

## 2014-07-02 NOTE — ED Notes (Signed)
Complain of low back pain ( chronic) states he back started hurting really bad about 0300 today. Pt was given a gram of tylenol by EMS, prior to arrival

## 2014-07-14 DIAGNOSIS — E11331 Type 2 diabetes mellitus with moderate nonproliferative diabetic retinopathy with macular edema: Secondary | ICD-10-CM | POA: Diagnosis not present

## 2014-07-14 DIAGNOSIS — H35373 Puckering of macula, bilateral: Secondary | ICD-10-CM | POA: Diagnosis not present

## 2014-07-14 DIAGNOSIS — E11321 Type 2 diabetes mellitus with mild nonproliferative diabetic retinopathy with macular edema: Secondary | ICD-10-CM | POA: Diagnosis not present

## 2014-07-14 DIAGNOSIS — H35372 Puckering of macula, left eye: Secondary | ICD-10-CM | POA: Diagnosis not present

## 2014-07-14 DIAGNOSIS — H35371 Puckering of macula, right eye: Secondary | ICD-10-CM | POA: Diagnosis not present

## 2014-07-20 ENCOUNTER — Encounter: Payer: Self-pay | Admitting: *Deleted

## 2014-07-30 DIAGNOSIS — H532 Diplopia: Secondary | ICD-10-CM | POA: Diagnosis not present

## 2014-07-31 ENCOUNTER — Telehealth: Payer: Self-pay | Admitting: *Deleted

## 2014-07-31 ENCOUNTER — Encounter: Payer: Self-pay | Admitting: Neurology

## 2014-07-31 ENCOUNTER — Ambulatory Visit: Payer: Medicare Other | Admitting: Neurology

## 2014-07-31 NOTE — Telephone Encounter (Signed)
Patient contacting our office requesting to cancel his scheduled follow up appointment with Dr. Rexene Alberts.  Patient has used CPAP for 28 years and recent download notes compliance and usage with a residual AHI of 6.0.  I spoke with his [place] and they do not need an office visit for him to continue to get supplies this coming year.  Dr. Rexene Alberts was consulted and she agreed to see patient on a bi-annual basis for follow up of OSA.  Patient will be contacted and his appointment will be rescheduled to early 2017.

## 2014-08-04 ENCOUNTER — Other Ambulatory Visit: Payer: Self-pay | Admitting: Urology

## 2014-08-04 ENCOUNTER — Ambulatory Visit (INDEPENDENT_AMBULATORY_CARE_PROVIDER_SITE_OTHER): Payer: Medicare Other | Admitting: Urology

## 2014-08-04 DIAGNOSIS — C61 Malignant neoplasm of prostate: Secondary | ICD-10-CM | POA: Diagnosis not present

## 2014-08-04 DIAGNOSIS — N200 Calculus of kidney: Secondary | ICD-10-CM | POA: Diagnosis not present

## 2014-08-07 ENCOUNTER — Ambulatory Visit (HOSPITAL_COMMUNITY)
Admission: RE | Admit: 2014-08-07 | Discharge: 2014-08-07 | Disposition: A | Discharge status: Home / Self Care | Payer: Medicare Other | Source: Ambulatory Visit | Attending: Urology | Admitting: Urology

## 2014-08-07 DIAGNOSIS — N200 Calculus of kidney: Secondary | ICD-10-CM | POA: Diagnosis not present

## 2014-08-07 NOTE — Progress Notes (Signed)
Quick Note:  I reviewed the patient's CPAP compliance data from 04/29/2014 through 07/27/2014 which is a total of 90 days during which time the patient used his machine every night with percent used days greater than 4 hours at 99%, indicating superb compliance with an average usage of 7 hours and 21 minutes on a CPAP pressure of 7 with EPR of 2. Residual AHI right at 6 per hour which is borderline. Leak acceptable at 17.3 L/m for the 95th percentile. Patient should follow-up with me in a year.  Star Age, MD, PhD [facility] ([facility])  ______

## 2014-08-24 ENCOUNTER — Encounter: Payer: Self-pay | Admitting: Neurology

## 2014-09-01 ENCOUNTER — Ambulatory Visit: Payer: Self-pay | Admitting: Neurology

## 2014-09-01 ENCOUNTER — Other Ambulatory Visit: Payer: Self-pay | Admitting: Endocrinology

## 2014-09-02 ENCOUNTER — Ambulatory Visit (INDEPENDENT_AMBULATORY_CARE_PROVIDER_SITE_OTHER): Payer: Medicare Other | Admitting: Endocrinology

## 2014-09-02 ENCOUNTER — Encounter: Payer: Self-pay | Admitting: Endocrinology

## 2014-09-02 VITALS — BP 138/65 | HR 54 | Temp 98.0°F | Resp 16 | Ht 70.0 in | Wt 229.0 lb

## 2014-09-02 DIAGNOSIS — E1165 Type 2 diabetes mellitus with hyperglycemia: Secondary | ICD-10-CM | POA: Diagnosis not present

## 2014-09-02 DIAGNOSIS — IMO0002 Reserved for concepts with insufficient information to code with codable children: Secondary | ICD-10-CM

## 2014-09-02 DIAGNOSIS — D638 Anemia in other chronic diseases classified elsewhere: Secondary | ICD-10-CM | POA: Diagnosis not present

## 2014-09-02 DIAGNOSIS — E785 Hyperlipidemia, unspecified: Secondary | ICD-10-CM

## 2014-09-02 DIAGNOSIS — I10 Essential (primary) hypertension: Secondary | ICD-10-CM

## 2014-09-02 DIAGNOSIS — G4733 Obstructive sleep apnea (adult) (pediatric): Secondary | ICD-10-CM

## 2014-09-02 LAB — COMPREHENSIVE METABOLIC PANEL
ALBUMIN: 3.9 g/dL (ref 3.5–5.2)
ALK PHOS: 49 U/L (ref 39–117)
ALT: 10 U/L (ref 0–53)
AST: 15 U/L (ref 0–37)
BUN: 28 mg/dL — ABNORMAL HIGH (ref 6–23)
CALCIUM: 9.6 mg/dL (ref 8.4–10.5)
CO2: 29 mEq/L (ref 19–32)
Chloride: 105 mEq/L (ref 96–112)
Creatinine, Ser: 1.2 mg/dL (ref 0.40–1.50)
GFR: 62.92 mL/min (ref >60.00)
Glucose, Bld: 132 mg/dL — ABNORMAL HIGH (ref 70–99)
POTASSIUM: 3.7 meq/L (ref 3.5–5.1)
Sodium: 140 mEq/L (ref 135–145)
TOTAL PROTEIN: 6.9 g/dL (ref 6.0–8.3)
Total Bilirubin: 0.3 mg/dL (ref 0.2–1.2)

## 2014-09-02 LAB — HEMOGLOBIN A1C: Hgb A1c MFr Bld: 7.7 % — ABNORMAL HIGH (ref 4.6–6.5)

## 2014-09-02 NOTE — Patient Instructions (Signed)
Must do the clicks when eating regardless of sugar level; have your wife remind you to do this

## 2014-09-02 NOTE — Progress Notes (Signed)
Patient ID: Jonathan Hensley, male   DOB: Jan 25, 1941, 74 y.o.   MRN: [id number]   Reason for Appointment: Diabetes follow-up   History of Present Illness    Type 2 diabetes mellitus, date of diagnosis: 1994.   He has been on insulin for the last few years, starting with only bedtime NPH and subsequently progressing to basal bolus regimen Also taking glipizide and metformin long-term  The HbgA1c was highest in 2013 at 8.2 Because of more fluctuation in his blood sugars at all times, difficulty with compliance and inconvenience of the multiple insulin dose regimen as well as needing higher doses of insulin he was started on the V.-go pump in 05/2013  Recent history:   The insulin regimen is described as basal rate of 20 units on V- go pump; boluses with carbohydrate coverage 1:10; usually 4-6 units 3 times a day   Despite using the V-go pump his A1c had gone up to 8.1% in 1/16 He is using the 20 basal on his V-go pump and  had been instructed on boluses at meals and snacks with carbohydrate counting  He is again tending to forget to do the boluses at meal times even though he has fairly good idea about counting carbohydrates He thinks he cannot concentrate enough to remember and even though he was reminded of the need to bolus before eating he is still presently not doing his bolus clicks until he checks his blood sugar and it is high His blood sugars may be slightly better than the last time but they are mostly higher in the afternoon and before supper time . He knows how to count carbohydrates and is still using 1:10 coverage Most of his postprandial readings are relatively high but his fasting readings are relatively consistent without any overnight hypoglycemia  Difficulties with medication adherence: Bolusing for meals  Proper timing of medications in relation to meals: Not consistent, Taking insulin bolus  after eating   Glucose monitoring: Accu-Chek, checking 3-4 times a day    Blood Glucose readings:   PRE-MEAL Breakfast Lunch  afternoon   8-9 PM  Overall  Glucose range:  104-148   130, 176   109-309   99-221   85-309   Mean/median:  129    169   152     Wt Readings from Last 3 Encounters:  09/02/14 229 lb (103.874 kg)  07/02/14 222 lb (100.699 kg)  06/03/14 230 lb 9.6 oz (104.599 kg)   Meals: 2 meals per day and usually no breakfast.   Carbohydrate intake: low;  Sometimes eating out and usually lunch is the larger meal of the day, snacks peanuts. 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  Diabetic nephropathy: Occasional mild microalbuminuria     Lab Results  Component Value Date   HGBA1C 8.1* 06/03/2014   HGBA1C 7.6* 03/03/2014   HGBA1C 7.8* 12/01/2013   Lab Results  Component Value Date   MICROALBUR 1.2 03/03/2014   LDLCALC 58 03/03/2014   CREATININE 1.09 06/03/2014         Medication List       This list is accurate as of: 09/02/14 10:16 AM.  Always use your most recent med list.               acetaminophen 500 MG tablet  Commonly known as:  TYLENOL  Take 500 mg by mouth every 8 (eight) hours as needed. pain  benazepril 40 MG tablet  Commonly known as:  LOTENSIN  Take 40 mg by mouth at bedtime.     ELIQUIS 5 MG Tabs tablet  Generic drug:  apixaban  Take 5 mg by mouth 2 (two) times daily.     ezetimibe 10 MG tablet  Commonly known as:  ZETIA  Take 10 mg by mouth daily.     fluticasone 0.05 % cream  Commonly known as:  CUTIVATE  Apply topically 2 (two) times daily.     freestyle lancets  Use as instructed to check blood sugar 4 times per day dx code 250.00     glucose blood test strip  Commonly known as:  FREESTYLE LITE  Use as instructed to check blood sugar 4 times day dx code 250.00     ketoconazole 2 % cream  Commonly known as:  NIZORAL  Apply 1 application topically 2 (two) times daily. Use as directed     metFORMIN 1000 MG tablet   Commonly known as:  GLUCOPHAGE  TAKE 1 TABLET TWO TIMES DAILY WITH A MEAL     NOVOLOG 100 UNIT/ML injection  Generic drug:  insulin aspart  USE UP TO 56 UNITS IN V-GO PUMP DAILY AS DIRECTED     simvastatin 20 MG tablet  Commonly known as:  ZOCOR  Take 20 mg by mouth daily.     traMADol 50 MG tablet  Commonly known as:  ULTRAM  Take 1 tablet (50 mg total) by mouth every 6 (six) hours as needed.     V-GO 20 Kit  USE ONE PER DAY        Allergies:  Allergies  Allergen Reactions  . Other Other (See Comments)    Beta or alpha blockers: severe dizziness    Past Medical History  Diagnosis Date  . History of prostate cancer   . Hypertension   . Diabetes mellitus   . Nephrolithiasis   . Diabetic retinopathy   . Bulging lumbar disc   . Dysrhythmia     Atrial Fibrillation  . Sleep apnea     uses cpap  . Arthritis     rheumatoid in his hands  . Cancer     Past Surgical History  Procedure Laterality Date  . Back surgery    . Brachytherapy    . Cystoscopy    . Left l4-l5 lumbar laminotomy and microdiskectomy with  07/14/2008    . Scleral buckle, laser photocoagulation, and anterior chamber  01/04/2009  . Bilateral l4-5 lumbar decompression including bilateral  11/2008  . Lithotripsy    . Appendectomy    . Rotator cuff surgery    . Colonoscopy    . Lumbar laminectomy/decompression microdiscectomy Left 08/18/2013    Procedure: Left Lumbar Five-Sacral One Microdiskectomy;  Surgeon: [name], MD;  Location: [facility];  Service: Neurosurgery;  Laterality: Left;  Left Lumbar Five-Sacral One Microdiskectomy    Family History  Problem Relation Age of Onset  . Heart failure Father   . Heart failure Mother   . Heart failure Brother     Half brother.    Social History:  reports that he quit smoking about 42 years ago. He has never used smokeless tobacco. He reports that he does not drink alcohol or use illicit drugs.  Review of Systems  Memory problems  occasionally.  He has been referred to a neurologist  Hypertension:  Home blood pressure has been about 219-758 systolic.   Blood pressure is being treated  with  benazepril alone, also followed by cardiologist  He is taking sodium bicarbonate for his history of kidney stones from urologist  HYPERLIPIDEMIA: Has been on simvastatin and Zetia  with good results  Lab Results  Component Value Date   CHOL 142 03/03/2014   HDL 51.90 03/03/2014   LDLCALC 58 03/03/2014   TRIG 162.0* 03/03/2014   CHOLHDL 3 03/03/2014     He has had retinal detachment treated surgically in the past, followed by ophthalmologist and still having difficulties including diplopia, using prism lenses  Sleep apnea, is on CPAP and has been monitored by neurologist   Anemia: This is mild and stable; stool Hemoccult  negative in 1/14  Lab Results  Component Value Date   WBC 6.6 12/01/2013   HGB 12.7* 12/01/2013   HCT 38.5* 12/01/2013   MCV 92.3 12/01/2013   PLT 251.0 12/01/2013   Diabetic foot exam done in 4/16   Examination:   BP 138/65 mmHg  Pulse 54  Temp(Src) 98 F (36.7 C)  Resp 16  Ht 5' 10"  (1.778 m)  Wt 229 lb (103.874 kg)  BMI 32.86 kg/m2  SpO2 97%  Body mass index is 32.86 kg/(m^2).    Diabetic foot exam shows normal monofilament sensation in the toes and plantar surfaces, no skin lesions or ulcers on the feet and normal pedal pulses   Assesment:   1. Diabetes type 2, uncontrolled    The patient's diabetes control is overall not well controlled with postprandial hyperglycemia See history of present illness for detailed discussion of current blood sugar patterns, problems identified and day-to-day management Although his fasting blood sugars are fairly good and better than the last time his compliance with mealtime boluses is still not adequate Discussed ways to improve his compliance with his mealtime boluses including asking his wife to remind him to do so. Also reminded him that he  can do a bolus even he is not up to check blood sugar at the time of mealtimes Discussed timing of glucose monitoring and blood sugar targets He can continue changing his V-go pump at bedtime but needs to do this every 24 hours consistently Also discussed balanced meals with some protein at each meal including breakfast  We will check his A1c again today  2. Hypertension: Well controlled at home and will continue Lotensin  3.  History of anemia: Will check on his next visit  Counseling time over 50% of today's 25 minute visit  Tasha Diaz 09/02/2014, 10:16 AM

## 2014-09-03 NOTE — Progress Notes (Signed)
Quick Note:  Please let patient know that his A1c is slightly better at 7.7 but still high  ______

## 2014-09-07 DIAGNOSIS — E780 Pure hypercholesterolemia: Secondary | ICD-10-CM | POA: Diagnosis not present

## 2014-09-07 DIAGNOSIS — E1165 Type 2 diabetes mellitus with hyperglycemia: Secondary | ICD-10-CM | POA: Diagnosis not present

## 2014-09-07 DIAGNOSIS — G4733 Obstructive sleep apnea (adult) (pediatric): Secondary | ICD-10-CM | POA: Diagnosis not present

## 2014-09-07 DIAGNOSIS — I480 Paroxysmal atrial fibrillation: Secondary | ICD-10-CM | POA: Diagnosis not present

## 2014-09-08 DIAGNOSIS — E11321 Type 2 diabetes mellitus with mild nonproliferative diabetic retinopathy with macular edema: Secondary | ICD-10-CM | POA: Diagnosis not present

## 2014-09-08 DIAGNOSIS — H35371 Puckering of macula, right eye: Secondary | ICD-10-CM | POA: Diagnosis not present

## 2014-09-08 DIAGNOSIS — E119 Type 2 diabetes mellitus without complications: Secondary | ICD-10-CM | POA: Diagnosis not present

## 2014-09-09 ENCOUNTER — Ambulatory Visit (INDEPENDENT_AMBULATORY_CARE_PROVIDER_SITE_OTHER): Payer: Medicare Other | Admitting: Diagnostic Neuroimaging

## 2014-09-09 ENCOUNTER — Encounter: Payer: Self-pay | Admitting: Diagnostic Neuroimaging

## 2014-09-09 VITALS — BP 163/71 | HR 61 | Ht 70.0 in | Wt 230.4 lb

## 2014-09-09 DIAGNOSIS — H532 Diplopia: Secondary | ICD-10-CM | POA: Diagnosis not present

## 2014-09-09 DIAGNOSIS — H5500 Unspecified nystagmus: Secondary | ICD-10-CM | POA: Diagnosis not present

## 2014-09-09 DIAGNOSIS — R42 Dizziness and giddiness: Secondary | ICD-10-CM | POA: Diagnosis not present

## 2014-09-09 NOTE — Progress Notes (Signed)
[facility]  PATIENT: Jonathan Hensley DOB: Dec 11, 1940  REFERRING CLINICIAN: M ROberts  HISTORY FROM: patient and wife  REASON FOR VISIT: new consult   HISTORICAL  CHIEF COMPLAINT:  Chief Complaint  Patient presents with  . New Evaluation    posterior dossa disease     HISTORY OF PRESENT ILLNESS:   74 year old right-handed male with diabetes, sleep apnea, paroxysmal atrial fibrillation, here for evaluation of double vision, nystagmus, abnormal vision. Patient feels onset of symptoms around 2013, around the time when he tripped and fell. At that time he went to the emergency room and was diagnosed with new atrial fibrillation. Since that time he has had intermittent double vision vertically, which improved with present glasses. He also has some double vision on left gaze. If he looks into the distance sometimes objects appear to be moving when his eyes or not moving. Patient was referred to neuro-ophthalmology for evaluation, but they recommended neurology evaluation before proceeding.    REVIEW OF SYSTEMS: Full 14 system review of systems performed and notable only for ringing in ears blurred vision double vision impotence.  ALLERGIES: Allergies  Allergen Reactions  . Other Other (See Comments)    Beta or alpha blockers: severe dizziness    HOME MEDICATIONS: Outpatient Prescriptions Prior to Visit  Medication Sig Dispense Refill  . apixaban (ELIQUIS) 5 MG TABS tablet Take 5 mg by mouth 2 (two) times daily.    . benazepril (LOTENSIN) 40 MG tablet Take 40 mg by mouth at bedtime.     Marland Kitchen ezetimibe (ZETIA) 10 MG tablet Take 10 mg by mouth daily.    . fluticasone (CUTIVATE) 0.05 % cream Apply topically 2 (two) times daily. 60 g 3  . glucose blood (FREESTYLE LITE) test strip Use as instructed to check blood sugar 4 times day dx code 250.00 400 each 3  . Insulin Disposable Pump (V-GO 20) KIT USE ONE PER DAY 3 kit 5  . ketoconazole (NIZORAL) 2 % cream Apply 1  application topically 2 (two) times daily. Use as directed 60 g 3  . Lancets (FREESTYLE) lancets Use as instructed to check blood sugar 4 times per day dx code 250.00 400 each 3  . metFORMIN (GLUCOPHAGE) 1000 MG tablet TAKE 1 TABLET TWO TIMES DAILY WITH A MEAL 180 tablet 2  . NOVOLOG 100 UNIT/ML injection USE UP TO 56 UNITS IN V-GO PUMP DAILY AS DIRECTED 6 vial 3  . simvastatin (ZOCOR) 20 MG tablet Take 20 mg by mouth daily.    Marland Kitchen acetaminophen (TYLENOL) 500 MG tablet Take 500 mg by mouth every 8 (eight) hours as needed. pain    . traMADol (ULTRAM) 50 MG tablet Take 1 tablet (50 mg total) by mouth every 6 (six) hours as needed. 20 tablet 0   No facility-administered medications prior to visit.    PAST MEDICAL HISTORY: Past Medical History  Diagnosis Date  . History of prostate cancer   . Hypertension   . Diabetes mellitus   . Nephrolithiasis   . Diabetic retinopathy   . Bulging lumbar disc   . Dysrhythmia     Atrial Fibrillation  . Sleep apnea     uses cpap  . Arthritis     rheumatoid in his hands  . Cancer     PAST SURGICAL HISTORY: Past Surgical History  Procedure Laterality Date  . Back surgery    . Brachytherapy    . Cystoscopy    . Left l4-l5 lumbar laminotomy and microdiskectomy with  07/14/2008    . Scleral buckle, laser photocoagulation, and anterior chamber  01/04/2009  . Bilateral l4-5 lumbar decompression including bilateral  11/2008  . Lithotripsy    . Appendectomy    . Rotator cuff surgery    . Colonoscopy    . Lumbar laminectomy/decompression microdiscectomy Left 08/18/2013    Procedure: Left Lumbar Five-Sacral One Microdiskectomy;  Surgeon: [name], MD;  Location: [place] [facility];  Service: Neurosurgery;  Laterality: Left;  Left Lumbar Five-Sacral One Microdiskectomy    FAMILY HISTORY: Family History  Problem Relation Age of Onset  . Heart failure Father   . Heart failure Mother   . Heart failure Brother     Half brother.    SOCIAL  HISTORY:  History   Social History  . Marital Status: Married    Spouse Name: [name]  . Number of Children: 2  . Years of Education: 12   Occupational History  . Not on file.   Social History Main Topics  . Smoking status: Former Smoker    Quit date: 07/12/1972  . Smokeless tobacco: Never Used  . Alcohol Use: No  . Drug Use: No  . Sexual Activity: Yes   Other Topics Concern  . Not on file   Social History Narrative   Lives at home with wife   Drinks 4 cups of coffee a day     PHYSICAL EXAM  Filed Vitals:   09/09/14 0927  BP: 163/71  Pulse: 61  Height: _0  (1.778 m)  Weight: 230 lb 6.4 oz (104.509 kg)    Body mass index is 33.06 kg/(m^2).   Visual Acuity Screening   Right eye Left eye Both eyes  Without correction:     With correction: 20/70 20/70     No flowsheet data found.  GENERAL EXAM: Patient is in no distress; well developed, nourished and groomed; neck is supple  CARDIOVASCULAR: Regular rate and rhythm, no murmurs, no carotid bruits  NEUROLOGIC: MENTAL STATUS: awake, alert, oriented to person, place and time, recent and remote memory intact, normal attention and concentration, language fluent, comprehension intact, naming intact, fund of knowledge appropriate CRANIAL NERVE: no papilledema on fundoscopic exam, pupils equal and reactive to light, visual fields full to confrontation, extraocular muscles intact, DOUBLE VISION ON LEFT GAZE; DOUBLE VISION ON PRIMARY GAZE WITHOUT GLASSES; ROTATORY NYSTAGMUS ON RIGHT AND LEFT GAZE. Facial sensation and strength symmetric, hearing intact, palate elevates symmetrically, uvula midline, shoulder shrug symmetric, tongue midline. MOTOR: normal bulk and tone, full strength in the BUE, BLE SENSORY: normal and symmetric to light touch, pinprick, temperature; DECR VIB IN TOES; DECR TEMP IN HANDS COORDINATION: finger-nose-finger, fine finger movements normal REFLEXES: deep tendon reflexes present and symmetric;  TRACE AT ANKLES GAIT/STATION: narrow based gait; able to walk on toes, heels; UNABLE TO TANDEM; romberg is negative     DIAGNOSTIC DATA (LABS, IMAGING, TESTING) - I reviewed patient records, labs, notes, testing and imaging myself where available.  Lab Results  Component Value Date   WBC 6.6 12/01/2013   HGB 12.7* 12/01/2013   HCT 38.5* 12/01/2013   MCV 92.3 12/01/2013   PLT 251.0 12/01/2013      Component Value Date/Time   NA 140 09/02/2014 1034   K 3.7 09/02/2014 1034   CL 105 09/02/2014 1034   CO2 29 09/02/2014 1034   GLUCOSE 132* 09/02/2014 1034   BUN 28* 09/02/2014 1034   CREATININE 1.20 09/02/2014 1034   CREATININE 1.08 06/02/2013 1243   CALCIUM 9.6 09/02/2014  1034   PROT 6.9 09/02/2014 1034   ALBUMIN 3.9 09/02/2014 1034   AST 15 09/02/2014 1034   ALT 10 09/02/2014 1034   ALKPHOS 49 09/02/2014 1034   BILITOT 0.3 09/02/2014 1034   GFRNONAA 63* 08/18/2013 0651   GFRAA 73* 08/18/2013 0651   Lab Results  Component Value Date   CHOL 142 03/03/2014   HDL 51.90 03/03/2014   LDLCALC 58 03/03/2014   TRIG 162.0* 03/03/2014   CHOLHDL 3 03/03/2014   Lab Results  Component Value Date   HGBA1C 7.7* 09/02/2014   No results found for: [id number] Lab Results  Component Value Date   TSH 2.370 12/19/2011    12/19/11 CT head 1. No acute displaced skull fractures or acute intracranial abnormalities. 2. Old left-sided lacunar infarctions and chronic microvascular ischemic changes in the white matter, similar to prior study 09/25/2009.  12/19/11 CT cervical spine 1. No evidence of significant acute traumatic injury to the cervical spine. 2. Mild multilevel degenerative disc disease and cervical spondylosis, as above.     ASSESSMENT AND PLAN  74 y.o. year old male here with onset of visual disturbance, double vision, oscillopsia, nystagmus, since 2013, possibly in the setting of fall and head trauma. We'll check MRI brain and orbits to rule out secondary  causes.  PLAN:  Orders Placed This Encounter  Procedures  . MR Brain/IAC Wo/W Cm  . MR Orbits WO/W Cm   Return in about 3 months (around 12/09/2014).    Penni Bombard, MD 05/24/3157, 45:85 AM Certified in Neurology, Neurophysiology and Neuroimaging  [facility] [address] [place], [place] [address] [phone]

## 2014-09-09 NOTE — Patient Instructions (Signed)
i will check additional testing. 

## 2014-09-10 ENCOUNTER — Telehealth: Payer: Self-pay | Admitting: *Deleted

## 2014-09-10 ENCOUNTER — Other Ambulatory Visit: Payer: Self-pay | Admitting: *Deleted

## 2014-09-10 MED ORDER — V-GO 20 KIT: PACK | Status: DC

## 2014-09-10 NOTE — Telephone Encounter (Signed)
Spoke to the office rep who stated that they no longer receive or send faxes and asked me to mail office notes to the office. Mailed today

## 2014-09-11 ENCOUNTER — Other Ambulatory Visit: Payer: Self-pay | Admitting: Endocrinology

## 2014-09-22 ENCOUNTER — Encounter: Payer: Self-pay | Admitting: *Deleted

## 2014-09-22 ENCOUNTER — Ambulatory Visit
Admission: RE | Admit: 2014-09-22 | Discharge: 2014-09-22 | Disposition: A | Discharge status: Home / Self Care | Payer: Medicare Other | Source: Ambulatory Visit | Attending: Diagnostic Neuroimaging | Admitting: Diagnostic Neuroimaging

## 2014-09-22 DIAGNOSIS — H532 Diplopia: Secondary | ICD-10-CM

## 2014-09-22 DIAGNOSIS — R42 Dizziness and giddiness: Secondary | ICD-10-CM

## 2014-09-22 DIAGNOSIS — H5500 Unspecified nystagmus: Secondary | ICD-10-CM

## 2014-09-22 MED ORDER — GADOBENATE DIMEGLUMINE 529 MG/ML IV SOLN
20.0000 mL | Freq: Once | INTRAVENOUS | Status: AC | PRN
  Administered 2014-09-22: 20 mL via INTRAVENOUS

## 2014-09-29 ENCOUNTER — Telehealth: Payer: Self-pay

## 2014-09-29 NOTE — Telephone Encounter (Signed)
Patient has been informed that MRI was unremarkable.

## 2014-10-06 DIAGNOSIS — H5500 Unspecified nystagmus: Secondary | ICD-10-CM | POA: Diagnosis not present

## 2014-10-06 DIAGNOSIS — E119 Type 2 diabetes mellitus without complications: Secondary | ICD-10-CM | POA: Diagnosis not present

## 2014-10-06 DIAGNOSIS — H5509 Other forms of nystagmus: Secondary | ICD-10-CM | POA: Diagnosis not present

## 2014-10-06 DIAGNOSIS — Z87891 Personal history of nicotine dependence: Secondary | ICD-10-CM | POA: Diagnosis not present

## 2014-10-06 DIAGNOSIS — H5021 Vertical strabismus, right eye: Secondary | ICD-10-CM | POA: Diagnosis not present

## 2014-10-07 DIAGNOSIS — H532 Diplopia: Secondary | ICD-10-CM | POA: Diagnosis not present

## 2014-10-28 ENCOUNTER — Other Ambulatory Visit: Payer: Self-pay | Admitting: Endocrinology

## 2014-11-14 ENCOUNTER — Encounter (HOSPITAL_COMMUNITY): Payer: Self-pay | Admitting: Emergency Medicine

## 2014-11-14 ENCOUNTER — Emergency Department (HOSPITAL_COMMUNITY): Payer: Medicare Other

## 2014-11-14 ENCOUNTER — Emergency Department (HOSPITAL_COMMUNITY)
Admission: EM | Admit: 2014-11-14 | Discharge: 2014-11-14 | Disposition: A | Discharge status: Home / Self Care | Payer: Medicare Other | Attending: Emergency Medicine | Admitting: Emergency Medicine

## 2014-11-14 DIAGNOSIS — Z79899 Other long term (current) drug therapy: Secondary | ICD-10-CM | POA: Insufficient documentation

## 2014-11-14 DIAGNOSIS — M5032 Other cervical disc degeneration, mid-cervical region: Secondary | ICD-10-CM | POA: Diagnosis not present

## 2014-11-14 DIAGNOSIS — I10 Essential (primary) hypertension: Secondary | ICD-10-CM | POA: Insufficient documentation

## 2014-11-14 DIAGNOSIS — M791 Myalgia: Secondary | ICD-10-CM | POA: Insufficient documentation

## 2014-11-14 DIAGNOSIS — Z8546 Personal history of malignant neoplasm of prostate: Secondary | ICD-10-CM | POA: Diagnosis not present

## 2014-11-14 DIAGNOSIS — M7918 Myalgia, other site: Secondary | ICD-10-CM

## 2014-11-14 DIAGNOSIS — E11319 Type 2 diabetes mellitus with unspecified diabetic retinopathy without macular edema: Secondary | ICD-10-CM | POA: Diagnosis not present

## 2014-11-14 DIAGNOSIS — Z7902 Long term (current) use of antithrombotics/antiplatelets: Secondary | ICD-10-CM | POA: Diagnosis not present

## 2014-11-14 DIAGNOSIS — M542 Cervicalgia: Secondary | ICD-10-CM | POA: Insufficient documentation

## 2014-11-14 DIAGNOSIS — Z7951 Long term (current) use of inhaled steroids: Secondary | ICD-10-CM | POA: Diagnosis not present

## 2014-11-14 DIAGNOSIS — Z87891 Personal history of nicotine dependence: Secondary | ICD-10-CM | POA: Diagnosis not present

## 2014-11-14 DIAGNOSIS — G4730 Sleep apnea, unspecified: Secondary | ICD-10-CM | POA: Insufficient documentation

## 2014-11-14 DIAGNOSIS — M47812 Spondylosis without myelopathy or radiculopathy, cervical region: Secondary | ICD-10-CM | POA: Diagnosis not present

## 2014-11-14 DIAGNOSIS — Z9981 Dependence on supplemental oxygen: Secondary | ICD-10-CM | POA: Insufficient documentation

## 2014-11-14 DIAGNOSIS — Z87442 Personal history of urinary calculi: Secondary | ICD-10-CM | POA: Insufficient documentation

## 2014-11-14 DIAGNOSIS — Z794 Long term (current) use of insulin: Secondary | ICD-10-CM | POA: Diagnosis not present

## 2014-11-14 DIAGNOSIS — R52 Pain, unspecified: Secondary | ICD-10-CM | POA: Diagnosis not present

## 2014-11-14 HISTORY — DX: Other cervical disc degeneration, unspecified cervical region: M50.30

## 2014-11-14 HISTORY — DX: Low back pain, unspecified: M54.50

## 2014-11-14 HISTORY — DX: Low back pain: M54.5

## 2014-11-14 HISTORY — DX: Diplopia: H53.2

## 2014-11-14 MED ORDER — METHOCARBAMOL 500 MG PO TABS
500.0000 mg | ORAL_TABLET | Freq: Once | ORAL | Status: AC
  Administered 2014-11-14: 500 mg via ORAL
  Filled 2014-11-14: qty 1

## 2014-11-14 MED ORDER — METHOCARBAMOL 500 MG PO TABS: 500.0000 mg | ORAL_TABLET | Freq: Two times a day (BID) | ORAL | Status: DC | PRN

## 2014-11-14 MED ORDER — HYDROCODONE-ACETAMINOPHEN 5-325 MG PO TABS: ORAL_TABLET | ORAL | Status: DC

## 2014-11-14 NOTE — ED Notes (Signed)
Pt reports neck pain that intermittently radiates to bilateral shoulders and head since Monday. Pt denies any known injury. Pt alert and oriented. nad noted. Pt denies any gi/gu symptoms.

## 2014-11-14 NOTE — ED Provider Notes (Signed)
CSN: [id number]     Arrival date & time 11/14/14  1456 History   First MD Initiated Contact with Patient 11/14/14 1538     Chief Complaint  Patient presents with  . Neck Pain      HPI Pt was seen at 1540. Per pt, c/o gradual onset and persistence of constant neck "pain" for the past 5 days.  Describes the pain as "spasms," located "on one side of my neck then the other." Pain worsens with palpation of the area and body position changes. Denies incont/retention of bowel or bladder, no saddle anesthesia, no focal motor weakness, no tingling/numbness in extremities, no fevers, no no rash, no injury, no abd pain, no CP/SOB.   The symptoms have been associated with no other complaints.    Past Medical History  Diagnosis Date  . History of prostate cancer   . Hypertension   . Diabetes mellitus   . Nephrolithiasis   . Diabetic retinopathy   . Bulging lumbar disc   . Dysrhythmia     Atrial Fibrillation  . Sleep apnea     uses cpap  . Arthritis     rheumatoid in his hands  . Cancer   . Low back pain   . Double vision   . DDD (degenerative disc disease), cervical    Past Surgical History  Procedure Laterality Date  . Back surgery    . Brachytherapy    . Cystoscopy    . Left l4-l5 lumbar laminotomy and microdiskectomy with  07/14/2008    . Scleral buckle, laser photocoagulation, and anterior chamber  01/04/2009  . Bilateral l4-5 lumbar decompression including bilateral  11/2008  . Lithotripsy    . Appendectomy    . Rotator cuff surgery    . Colonoscopy    . Lumbar laminectomy/decompression microdiscectomy Left 08/18/2013    Procedure: Left Lumbar Five-Sacral One Microdiskectomy;  Surgeon: [name], MD;  Location: [facility];  Service: Neurosurgery;  Laterality: Left;  Left Lumbar Five-Sacral One Microdiskectomy   Family History  Problem Relation Age of Onset  . Heart failure Father   . Heart failure Mother   . Heart failure Brother     Half brother.   History   Substance Use Topics  . Smoking status: Former Smoker    Quit date: 07/12/1972  . Smokeless tobacco: Never Used  . Alcohol Use: No    Review of Systems ROS: Statement: All systems negative except as marked or noted in the HPI; Constitutional: Negative for fever and chills. ; ; Eyes: Negative for eye pain, redness and discharge. ; ; ENMT: Negative for ear pain, hoarseness, nasal congestion, sinus pressure and sore throat. ; ; Cardiovascular: Negative for chest pain, palpitations, diaphoresis, dyspnea and peripheral edema. ; ; Respiratory: Negative for cough, wheezing and stridor. ; ; Gastrointestinal: Negative for nausea, vomiting, diarrhea, abdominal pain, blood in stool, hematemesis, jaundice and rectal bleeding. . ; ; Genitourinary: Negative for dysuria, flank pain and hematuria. ; ; Musculoskeletal: +neck pain. Negative for back pain. Negative for swelling and trauma.; ; Skin: Negative for pruritus, rash, abrasions, blisters, bruising and skin lesion.; ; Neuro: Negative for headache, lightheadedness and neck stiffness. Negative for weakness, altered level of consciousness , altered mental status, extremity weakness, paresthesias, involuntary movement, seizure and syncope.      Allergies  Other and Tramadol  Home Medications   Prior to Admission medications   Medication Sig Start Date End Date Taking? Authorizing Provider  apixaban (ELIQUIS) 5 MG TABS  tablet Take 5 mg by mouth 2 (two) times daily.   Yes Historical Provider, MD  benazepril (LOTENSIN) 40 MG tablet Take 40 mg by mouth at bedtime.    Yes Historical Provider, MD  Cyanocobalamin (VITAMIN B 12 PO) Take 1 tablet by mouth daily.   Yes Historical Provider, MD  ezetimibe (ZETIA) 10 MG tablet Take 10 mg by mouth at bedtime.    Yes Historical Provider, MD  Insulin Disposable Pump (V-GO 20) KIT USE ONE PER DAY 09/10/14  Yes [name], MD  metFORMIN (GLUCOPHAGE) 1000 MG tablet TAKE 1 TABLET TWICE A DAY WITH MEALS 09/11/14  Yes [name], MD  NOVOLOG 100 UNIT/ML injection USE UP TO 56 UNITS IN V-GO PUMP DAILY AS DIRECTED 09/01/14  Yes [name], MD  PYRIDOXINE HCL PO Take 1 tablet by mouth daily.   Yes Historical Provider, MD  simvastatin (ZOCOR) 20 MG tablet Take 20 mg by mouth at bedtime.    Yes Historical Provider, MD  Thiamine HCl (THIAMINE PO) Take 1 tablet by mouth daily.   Yes Historical Provider, MD  fluticasone (CUTIVATE) 0.05 % cream Apply topically 2 (two) times daily. 03/24/13   [name], MD  glucose blood (FREESTYLE LITE) test strip USE TO CHECK BLOOD SUGAR 4 TIMES DAILY AS INSTRUCTED. DX: E11.9 10/28/14   [name], MD  ketoconazole (NIZORAL) 2 % cream Apply 1 application topically 2 (two) times daily. Use as directed 01/08/14   [name], MD  Lancets (FREESTYLE) lancets Use as instructed to check blood sugar 4 times per day dx code 250.00 12/10/13   [name], MD   BP 173/74 mmHg  Pulse 77  Temp(Src) 98.8 F (37.1 C) (Oral)  Resp 18  Ht 5' 10" (1.778 m)  Wt 220 lb (99.791 kg)  BMI 31.57 kg/m2  SpO2 98% Physical Exam  1545: Physical examination:  Nursing notes reviewed; Vital signs and O2 SAT reviewed;  Constitutional: Well developed, Well nourished, Well hydrated, In no acute distress; Head:  Normocephalic, atraumatic; Eyes: EOMI, PERRL, No scleral icterus; ENMT: Mouth and pharynx normal, Mucous membranes moist; Neck: Supple, Full range of motion, No lymphadenopathy. No meningeal signs.; Cardiovascular: Regular rate and rhythm, No gallop; Respiratory: Breath sounds clear & equal bilaterally, No rales, rhonchi, wheezes.  Speaking full sentences with ease, Normal respiratory effort/excursion; Chest: Nontender, Movement normal; Abdomen: Soft, Nontender, Nondistended, Normal bowel sounds; Genitourinary: No CVA tenderness; Spine:  No midline CS, TS, LS tenderness. +TTP R>L hypertonic trapezius muscles, +TTP bilat cervical paraspinal muscles. No rash.;; Extremities: Pulses normal, No tenderness, No edema, No  calf edema or asymmetry.; Neuro: AA&Ox3, Major CN grossly intact.  Speech clear. Grips equal. No gross focal motor or sensory deficits in extremities. Climbs on and off stretcher easily by himself. Gait steady.; Skin: Color normal, Warm, Dry.   ED Course  Procedures     EKG Interpretation None      MDM  MDM Reviewed: previous chart, nursing note and vitals Reviewed previous: CT scan Interpretation: CT scan     Ct Cervical Spine Wo Contrast 11/14/2014   CLINICAL DATA:  Neck pain extending into both shoulders and up into the head for 6 days.  EXAM: CT CERVICAL SPINE WITHOUT CONTRAST  TECHNIQUE: Multidetector CT imaging of the cervical spine was performed without intravenous contrast. Multiplanar CT image reconstructions were also generated.  COMPARISON:  CT scan dated 12/19/2011  FINDINGS: There is no fracture or prevertebral soft tissue swelling. There is moderate degenerative facet arthritis from C2-3 through  C4-5 on the left and at C4-5 on the right. 2 mm subluxation of C4 on C5 due to facet arthritis. Disc space narrowing at C5-6 and C6-7 and C7-T1 with 2 mm subluxation of C7 on T1 due to left facet arthritis. The no foraminal stenosis in the cervical spine.  IMPRESSION: Multilevel degenerative disc and joint disease. No discrete disc protrusions or focal neural impingement.   Electronically Signed   By: Lorriane Shire M.D.   On: 11/14/2014 16:32    1655:  Multilevel DDD/DJD of CS on CT scan. Tx symptomatically for msk pain at this time. Dx and testing d/w pt and family.  Questions answered.  Verb understanding, agreeable to d/c home with outpt f/u.   Francine Graven, DO 11/18/14 1041

## 2014-11-14 NOTE — Discharge Instructions (Signed)
°Emergency Department Resource Guide °1) Find a Doctor and Pay Out of Pocket °Although you won't have to find out who is covered by your insurance plan, it is a good idea to ask around and get recommendations. You will then need to call the office and see if the doctor you have chosen will accept you as a new patient and what types of options they offer for patients who are self-pay. Some doctors offer discounts or will set up payment plans for their patients who do not have insurance, but you will need to ask so you aren't surprised when you get to your appointment. ° °2) Contact Your Local Health Department °Not all health departments have doctors that can see patients for sick visits, but many do, so it is worth a call to see if yours does. If you don't know where your local health department is, you can check in your phone book. The CDC also has a tool to help you locate your state's health department, and many state websites also have listings of all of their local health departments. ° °3) Find a Walk-in Clinic °If your illness is not likely to be very severe or complicated, you may want to try a walk in clinic. These are popping up all over the country in pharmacies, drugstores, and shopping centers. They're usually staffed by nurse practitioners or physician assistants that have been trained to treat common illnesses and complaints. They're usually fairly quick and inexpensive. However, if you have serious medical issues or chronic medical problems, these are probably not your best option. ° °No Primary Care Doctor: °- Call Health Connect at  832-8000 - they can help you locate a primary care doctor that  accepts your insurance, provides certain services, etc. °- Physician Referral Service- 1-800-533-3463 ° °Chronic Pain Problems: °Organization         Address  Phone   Notes  °[facility]  ([phone]) [phone] Patients need to be referred by their primary care doctor.  ° °Medication  Assistance: °Organization         Address  Phone   Notes  °[place] [facility] [address] °[place], [place] [address] (336) 641-8030 --Must be a resident of [place] °-- Must have NO insurance coverage whatsoever (no Medicaid/ Medicare, etc.) °-- The pt. MUST have a primary care doctor that directs their care regularly and follows them in the community °  °[facility]  (866) 331-1348   °[employer]  (888) 892-1162   ° °Agencies that provide inexpensive medical care: °Organization         Address  Phone   Notes  °[facility]  (336) 832-8035   °[facility]    (336) 832-7272   °[facility] [address] °[place], [place] [address] (336) 832-4777   °[facility] [address], °[place] (336) 271-4999   °[facility]    (336) 373-0678   °[facility]    (336) 272-1050   °[facility] ° [address], [place] Phone:  (336) 832-4444, Fax:  (336) 832-4440 Hours of Operation:  9 am - 6 pm, M-F.  Also accepts Medicaid/Medicare and self-pay.  °[facility] ° [address], [place] Phone: (336) [phone], Fax: (336) [phone]. Hours of Operation:  8:30 am - 5:30 pm, M-F.  Also accepts Medicaid and self-pay.  °[facility] [place] [address], [place] Phone: (336) 878-6027   °[facility] [address], [place], [place] (336)723-1848, Ext. 123 Mondays & Thursdays: 7-9 AM.  First 15 patients are seen on a first come, first serve basis. °  ° °Medicaid-accepting [place] Providers: ° °Organization         Address  Phone   Notes  °[facility] [address], [place] (336) 641-2100 Also accepts self-pay patients.  °[facility] [address], [place] ° (336) [phone]   °[facility] [address], [place]  (336) 288-8857   °[facility] [address] [place] [address], [place] (336) 299-7000   °Veita Bland [address], [place]  ° (336) [phone] Only accepts [place] Access Medicaid patients after they have their name applied to their card.  ° °Self-Pay (no insurance) in [place]: ° °Organization         Address  Phone   Notes  °Sickle Cell Patients, [facility] [address], [place] (336) 832-1970   °[facility] [address], [place] (336) 832-4400   °[facility] ° [address], [place] (336) 992-4800   °[facility]/Dr. Osei-Bonsu ° [address] [place] [address], [place] or [address], [place] (336) [phone] Phone number for both [place] and [place] locations is the same.  °[facility] [address], [place] (336) 299-0000   °[facility] [address] [place] [address], [place] or [address] (336) 852-7530 °(336) 878-2260   °[facility] [address], [place] (336) 350-1642, phone; (336) 294-5005, fax Sees patients 1st and 3rd Saturday of every month.  Must not qualify for public or private insurance (i.e. Medicaid, Medicare, [place] Health Choice, Veterans' Benefits) • Household income should be no more than 200% of the poverty level •The clinic cannot treat you if you are pregnant or think you are pregnant • Sexually transmitted diseases are not treated at the clinic.  ° ° °Dental Care: °Organization         Address  Phone  Notes  °[place] [facility] [address], [place] (336) 641-6152 Accepts children up to age 21 who are enrolled in Medicaid or [place] Health Choice; pregnant women with a Medicaid card; and children who have applied for Medicaid or [place] Health Choice, but were declined, whose parents can pay a reduced fee at time of service.  °[place]  [facility] [place]  [address], [place] (336) 641-7733 Accepts children up to age 21 who are enrolled in Medicaid or [place] Health Choice; pregnant women with a Medicaid card; and children who have applied for Medicaid or [place] Health Choice, but were declined, whose parents can pay a reduced fee at time of service.  °[facility] ° [address], [place] (336) 641-4533 Patients are seen by appointment only. Walk-ins are not accepted. [facility] will see patients 18 years of age and older. °Monday - Tuesday (8am-5pm) °Most Wednesdays (8:30-5pm) °$30 per visit, cash only  °[facility] ° [address], [place] (336) 641-4533 Patients are seen by appointment only. Walk-ins are not accepted. [facility] will see patients 18 years of age and older. °One   Wednesday Evening (Monthly: Volunteer Based).  $30 per visit, cash only  °[facility]  (919) 537-3737 for adults; Children under age 4, call Graduate Pediatric Dentistry at ([phone]) [phone]. Children aged 4-14, please call (919) [phone] to request a pediatric application. ° Dental services are provided in all areas of dental care including fillings, crowns and bridges, complete and partial dentures, implants, gum treatment, root canals, and extractions. Preventive care is also provided. Treatment is provided to both adults and children. °Patients are selected via a lottery and there is often a waiting list. °  °[facility] [address], °[place] ° (336) 763-8833 [email or web address] °  °[facility] [address], [place], [place] (336)723-1848, Ext. 123 Second and Fourth Thursday of each month, opens at 6:30 AM; Clinic ends at 9 AM.  Patients are seen on a first-come first-served basis, and a limited number are seen during each clinic.  ° °[facility] ° [address], [place], [place] (336) [phone]    Eligibility Requirements °You must have lived in [place], or [place] counties for at least the last three months. °  You cannot be eligible for state or federal sponsored healthcare insurance, including Veterans Administration, Medicaid, or Medicare. °  You generally cannot be eligible for healthcare insurance through your employer.  °  How to apply: °Eligibility screenings are held every Tuesday and Wednesday afternoon from 1:00 pm until 4:00 pm. You do not need an appointment for the interview!  °[facility] [address], [place], [place] 336-631-2330   °[facility]  336-342-8273   °[facility]  336-703-3100   °[facility]  336-570-6415   ° °Behavioral Health Resources in the Community: °Intensive Outpatient Programs °Organization         Address  Phone  Notes  °[facility] [address], [place], [place] 336-878-6098   °[facility] [address], [place], [place] 336-832-9800   °[facility] [address], [place], [place] ° 336-882-2125   °[facility] [address],  °[place], [place] 1-800-853-5163 or 336-641-4981   °Substance Abuse Resources °Organization         Address  Phone  Notes  °[facility]  336-882-2125   °[facility]  336-784-9470   °[facility]  336-285-9073   °[facility]  336-845-3988   °Residential & Outpatient Substance Abuse Program  1-800-659-3381   °Psychological Services °Organization         Address  Phone  Notes  ° Health  336- 832-9600   °[facility]  336- 378-7881   °[facility] [address], [place] 1-800-853-5163 or 336-641-4981   ° °Mobile Crisis Teams °Organization         Address  Phone  Notes  °[facility], Mobile Crisis Care Unit  1-877-626-1772   °Assertive °[facility] ° [address]  [place], [place] 336-834-9664   °Sharon DeEsch [address] °[place] 336-554-5454   ° °Self-Help/Support Groups °Organization         Address  Phone             Notes  °[facility] [place] - variety of support groups  336- 373-1402 Call for more information  °Narcotics Anonymous (NA), [facility] [address], °[place]   2 meetings at this location  ° °  Residential Treatment Programs Organization         Address  Phone  Notes  [facility] [address]    [place]  1-351-856-1839   [facility]  [address] [place] [id number], [employer], [place]   [place], [place] (984) 168-7011 Admissions: 8am-3pm M-F  [facility] [place] [address]    [place] , [place] [id number]   [facility] [address] [place], [place], [place]   [facility] [address]  [place], [place]   [facility] - Intensive Outpatient [address], [place], [place]   [facility] ([place]  [place], [place] 1-778-838-9584 or 330-476-9926   [facility] [address], [place], [place] Accepts Medicaid  [facility] [address]  [place] 1-715-689-8046 Substance Abuse/Addiction Treatment   [facility]         Address  Phone  Notes  [facility]  [phone]   Domenic Schwab, PhD [address] Arlis Porta [place], [place]   223-542-1428 or 813-265-4461   [place], [place] 907-838-2393   [facility] [address] [place], [place] (762)459-6292 Insurance/Medicaid/sponsorship through [facility] [place] [facility] [address]                                    [place], [place] 870-304-9081 [place] [address], [place] (814)[phone]    Dr. Adele Schilder  518-488-8703   [facility] Dept. 1) [address] [place] 2) [place] 3)  [address], [place] 539-077-0745 720-410-3282  629-750-2026   [place] 951-227-5202 or (657) [phone] (After Hours)      Take the prescriptions as directed.  Apply moist heat or ice to the area(s) of discomfort, for 15 minutes at a time, several times per day for the next few days.  Do not fall asleep on a heating or ice pack.  Call your regular medical doctor and your Neurosurgeon on Monday to schedule a follow up appointment this week.  Return to the Emergency Department immediately if worsening.

## 2014-11-18 DIAGNOSIS — Z6831 Body mass index (BMI) 31.0-31.9, adult: Secondary | ICD-10-CM | POA: Diagnosis not present

## 2014-11-18 DIAGNOSIS — M5030 Other cervical disc degeneration, unspecified cervical region: Secondary | ICD-10-CM | POA: Diagnosis not present

## 2014-11-18 DIAGNOSIS — M47812 Spondylosis without myelopathy or radiculopathy, cervical region: Secondary | ICD-10-CM | POA: Diagnosis not present

## 2014-11-18 DIAGNOSIS — M542 Cervicalgia: Secondary | ICD-10-CM | POA: Diagnosis not present

## 2014-11-18 DIAGNOSIS — I10 Essential (primary) hypertension: Secondary | ICD-10-CM | POA: Diagnosis not present

## 2014-11-19 DIAGNOSIS — M47812 Spondylosis without myelopathy or radiculopathy, cervical region: Secondary | ICD-10-CM | POA: Diagnosis not present

## 2014-11-19 DIAGNOSIS — M5020 Other cervical disc displacement, unspecified cervical region: Secondary | ICD-10-CM | POA: Diagnosis not present

## 2014-11-20 DIAGNOSIS — M5030 Other cervical disc degeneration, unspecified cervical region: Secondary | ICD-10-CM | POA: Diagnosis not present

## 2014-11-20 DIAGNOSIS — I10 Essential (primary) hypertension: Secondary | ICD-10-CM | POA: Diagnosis not present

## 2014-11-20 DIAGNOSIS — M47812 Spondylosis without myelopathy or radiculopathy, cervical region: Secondary | ICD-10-CM | POA: Diagnosis not present

## 2014-11-20 DIAGNOSIS — Z6832 Body mass index (BMI) 32.0-32.9, adult: Secondary | ICD-10-CM | POA: Diagnosis not present

## 2014-11-20 DIAGNOSIS — M542 Cervicalgia: Secondary | ICD-10-CM | POA: Diagnosis not present

## 2014-11-25 ENCOUNTER — Telehealth (HOSPITAL_COMMUNITY): Payer: Self-pay

## 2014-11-25 NOTE — Telephone Encounter (Signed)
11/25/14 pt called to schedule evaluation but was told the soonest we had was Aug. 2 and he thought it was a [place] to come to therapy for one week before returning to [name] on Aug. 9.  He said he would call the office to see if they could refer him elsewhere.

## 2014-11-26 ENCOUNTER — Encounter: Payer: Self-pay | Admitting: Endocrinology

## 2014-11-26 ENCOUNTER — Ambulatory Visit (INDEPENDENT_AMBULATORY_CARE_PROVIDER_SITE_OTHER): Payer: Medicare Other | Admitting: Endocrinology

## 2014-11-26 VITALS — BP 136/66 | HR 63 | Temp 98.1°F | Resp 16 | Ht 70.0 in | Wt 223.4 lb

## 2014-11-26 DIAGNOSIS — D638 Anemia in other chronic diseases classified elsewhere: Secondary | ICD-10-CM

## 2014-11-26 DIAGNOSIS — E1165 Type 2 diabetes mellitus with hyperglycemia: Secondary | ICD-10-CM | POA: Diagnosis not present

## 2014-11-26 DIAGNOSIS — G4733 Obstructive sleep apnea (adult) (pediatric): Secondary | ICD-10-CM

## 2014-11-26 DIAGNOSIS — Z23 Encounter for immunization: Secondary | ICD-10-CM

## 2014-11-26 DIAGNOSIS — IMO0002 Reserved for concepts with insufficient information to code with codable children: Secondary | ICD-10-CM

## 2014-11-26 DIAGNOSIS — E11319 Type 2 diabetes mellitus with unspecified diabetic retinopathy without macular edema: Secondary | ICD-10-CM | POA: Diagnosis not present

## 2014-11-26 LAB — BASIC METABOLIC PANEL
BUN: 30 mg/dL — AB (ref 6–23)
CO2: 28 mEq/L (ref 19–32)
Calcium: 9.4 mg/dL (ref 8.4–10.5)
Chloride: 104 mEq/L (ref 96–112)
Creatinine, Ser: 1.35 mg/dL (ref 0.40–1.50)
GFR: 54.89 mL/min — ABNORMAL LOW (ref >60.00)
Glucose, Bld: 274 mg/dL — ABNORMAL HIGH (ref 70–99)
Potassium: 4.3 mEq/L (ref 3.5–5.1)
Sodium: 138 mEq/L (ref 135–145)

## 2014-11-26 LAB — HEMOGLOBIN A1C: Hgb A1c MFr Bld: 7.6 % — ABNORMAL HIGH (ref 4.6–6.5)

## 2014-11-26 LAB — MICROALBUMIN / CREATININE URINE RATIO
CREATININE, U: 134.8 mg/dL
MICROALB UR: 3.2 mg/dL — AB (ref 0.0–1.9)
MICROALB/CREAT RATIO: 2.4 mg/g (ref 0.0–30.0)

## 2014-11-26 LAB — CBC
HCT: 33.6 % — ABNORMAL LOW (ref 39.0–52.0)
HEMOGLOBIN: 11.2 g/dL — AB (ref 13.0–17.0)
MCHC: 33.2 g/dL (ref 30.0–36.0)
MCV: 89.7 fl (ref 78.0–100.0)
PLATELETS: 316 10*3/uL (ref 150.0–400.0)
RBC: 3.75 Mil/uL — AB (ref 4.22–5.81)
RDW: 13.4 % (ref 11.5–15.5)
WBC: 6.5 10*3/uL (ref 4.0–10.5)

## 2014-11-26 LAB — LIPID PANEL
Cholesterol: 115 mg/dL (ref 0–200)
HDL: 42.1 mg/dL (ref >39.00)
LDL CALC: 45 mg/dL (ref 0–99)
NonHDL: 72.9
Total CHOL/HDL Ratio: 3
Triglycerides: 139 mg/dL (ref 0.0–149.0)
VLDL: 27.8 mg/dL (ref 0.0–40.0)

## 2014-11-26 LAB — POCT GLYCOSYLATED HEMOGLOBIN (HGB A1C): Hemoglobin A1C: 7.3

## 2014-11-26 NOTE — Progress Notes (Signed)
Quick Note:  Please let patient know that he is more anemic; will need to mail him Hemoccult cards and return in 6 weeks for follow-up with CBC Also of his glucose was 274, must not be taking enough insulin for meals   ______

## 2014-11-26 NOTE — Progress Notes (Signed)
Patient ID: Jonathan Hensley, male   DOB: 1940-12-28, 74 y.o.   MRN: [id number]   Reason for Appointment: Diabetes follow-up   History of Present Illness    Type 2 diabetes mellitus, date of diagnosis: 1994.   He has been on insulin for the last few years, starting with only bedtime NPH and subsequently progressing to basal bolus regimen Also taking glipizide and metformin long-term  The HbgA1c was highest in 2013 at 8.2 Because of more fluctuation in his blood sugars at all times, difficulty with compliance and inconvenience of the multiple insulin dose regimen as well as needing higher doses of insulin he was started on the V.-go pump in 05/2013  Recent history:   The insulin regimen is described as basal rate of 20 units on V- go pump; boluses with carbohydrate coverage 1:10; usually 4-6 units 3 times a day   Despite using the V-go pump his A1c had gone up to 8.1% in 1/16 and his level of control is now slowly improving He is using the 20 basal on his V-go pump and had been instructed on boluses at meals and snacks with carbohydrate counting   Current blood sugar patterns and problems identified:  He did not bring his blood sugar monitor today and not able to accurately recall his readings  His A1c in house is better at 7.3  Part of his improvement is related to decreased appetite with his recent problems related to neck pain  He claims that he is taking his mealtime boluses more consistently even though he was not doing this previously  Does not think he has any low sugars although he does take a bedtime snack and believes his blood sugar will be down to about 80 if he does not do so  Not able to do much physical activity  Has lost weight because of decreased appetite . He knows how to count carbohydrates and is still using 1:10 coverage  Difficulties with medication adherence: Bolusing for meals after eating but less recently   Glucose monitoring: Accu-Chek,  checking 3-4 times a day  Blood Glucose readings: by recall are about 120 fasting and about 160 after meals   Wt Readings from Last 3 Encounters:  11/26/14 223 lb 6.4 oz (101.334 kg)  11/14/14 220 lb (99.791 kg)  09/09/14 230 lb 6.4 oz (104.509 kg)   Meals: 2 meals per day and usually no lunch.   Carbohydrate intake: low;  Sometimes eating out and usually lunch is the larger meal of the day, snacks peanuts. 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  Diabetic nephropathy: Occasional mild microalbuminuria     Lab Results  Component Value Date   HGBA1C 7.6* 11/26/2014   HGBA1C 7.3 11/26/2014   HGBA1C 7.7* 09/02/2014   Lab Results  Component Value Date   MICROALBUR 3.2* 11/26/2014   LDLCALC 45 11/26/2014   CREATININE 1.35 11/26/2014         Medication List       This list is accurate as of: 11/26/14  8:55 PM.  Always use your most recent med list.               benazepril 40 MG tablet  Commonly known as:  LOTENSIN  Take 40 mg by mouth at bedtime.     ELIQUIS 5 MG Tabs tablet  Generic drug:  apixaban  Take 5 mg by mouth 2 (two) times daily.  ezetimibe 10 MG tablet  Commonly known as:  ZETIA  Take 10 mg by mouth at bedtime.     fluticasone 0.05 % cream  Commonly known as:  CUTIVATE  Apply topically 2 (two) times daily.     freestyle lancets  Use as instructed to check blood sugar 4 times per day dx code 250.00     glucose blood test strip  Commonly known as:  FREESTYLE LITE  USE TO CHECK BLOOD SUGAR 4 TIMES DAILY AS INSTRUCTED. DX: E11.9     HYDROcodone-acetaminophen 5-325 MG per tablet  Commonly known as:  NORCO/VICODIN  1 tab PO q12 hours prn pain     ketoconazole 2 % cream  Commonly known as:  NIZORAL  Apply 1 application topically 2 (two) times daily. Use as directed     metFORMIN 1000 MG tablet  Commonly known as:  GLUCOPHAGE  TAKE 1 TABLET TWICE A DAY WITH MEALS      methocarbamol 500 MG tablet  Commonly known as:  ROBAXIN  Take 1 tablet (500 mg total) by mouth 2 (two) times daily as needed for muscle spasms.     NOVOLOG 100 UNIT/ML injection  Generic drug:  insulin aspart  USE UP TO 56 UNITS IN V-GO PUMP DAILY AS DIRECTED     PYRIDOXINE HCL PO  Take 1 tablet by mouth daily.     simvastatin 20 MG tablet  Commonly known as:  ZOCOR  Take 20 mg by mouth at bedtime.     THIAMINE PO  Take 1 tablet by mouth daily.     V-GO 20 Kit  USE ONE PER DAY     VITAMIN B 12 PO  Take 1 tablet by mouth daily.        Allergies:  Allergies  Allergen Reactions  . Other Other (See Comments)    Beta or alpha blockers: severe dizziness  . Tramadol Other (See Comments)    Dizziness     Past Medical History  Diagnosis Date  . History of prostate cancer   . Hypertension   . Diabetes mellitus   . Nephrolithiasis   . Diabetic retinopathy   . Bulging lumbar disc   . Dysrhythmia     Atrial Fibrillation  . Sleep apnea     uses cpap  . Arthritis     rheumatoid in his hands  . Cancer   . Low back pain   . Double vision   . DDD (degenerative disc disease), cervical     Past Surgical History  Procedure Laterality Date  . Back surgery    . Brachytherapy    . Cystoscopy    . Left l4-l5 lumbar laminotomy and microdiskectomy with  07/14/2008    . Scleral buckle, laser photocoagulation, and anterior chamber  01/04/2009  . Bilateral l4-5 lumbar decompression including bilateral  11/2008  . Lithotripsy    . Appendectomy    . Rotator cuff surgery    . Colonoscopy    . Lumbar laminectomy/decompression microdiscectomy Left 08/18/2013    Procedure: Left Lumbar Five-Sacral One Microdiskectomy;  Surgeon: [name], MD;  Location: [facility];  Service: Neurosurgery;  Laterality: Left;  Left Lumbar Five-Sacral One Microdiskectomy    Family History  Problem Relation Age of Onset  . Heart failure Father   . Heart failure Mother   . Heart failure  Brother     Half brother.    Social History:  reports that he quit smoking about 42 years ago. He has  never used smokeless tobacco. He reports that he does not drink alcohol or use illicit drugs.  Review of Systems  No recent fatigue, he thinks he feels fairly well  He has had neck pain and neurosurgeon has evaluated him, taking hydrocodone and Robaxin as needed  Hypertension:  Home blood pressure not checked recently Previously has been about 191-478 systolic.   Blood pressure is being treated  with benazepril alone, also followed by cardiologist  He is taking sodium bicarbonate for his history of kidney stones from urologist  HYPERLIPIDEMIA: Has been on simvastatin and Zetia  with good results  Lab Results  Component Value Date   CHOL 115 11/26/2014   HDL 42.10 11/26/2014   LDLCALC 45 11/26/2014   TRIG 139.0 11/26/2014   CHOLHDL 3 11/26/2014     He has had retinal detachment treated surgically in the past, followed by ophthalmologist and still having difficulties including diplopia, using prism lenses  Sleep apnea, is on CPAP and has been monitored by neurologist   Anemia: This is mild; stool Hemoccult  negative in 1/14  Lab Results  Component Value Date   WBC 6.5 11/26/2014   HGB 11.2* 11/26/2014   HCT 33.6* 11/26/2014   MCV 89.7 11/26/2014   PLT 316.0 11/26/2014   Diabetic foot exam done in 4/16   Examination:   BP 136/66 mmHg  Pulse 63  Temp(Src) 98.1 F (36.7 C)  Resp 16  Ht _0  (1.778 m)  Wt 223 lb 6.4 oz (101.334 kg)  BMI 32.05 kg/m2  SpO2 96%  Body mass index is 32.05 kg/(m^2).    No edema  Assesment:   1. Diabetes type 2, uncontrolled    The patient's diabetes control is overall improving with his better compliance with taking boluses right before eating instead of randomly afterwards See history of present illness for detailed discussion of current blood sugar patterns, problems identified and day-to-day management However he did not  bring his monitor for download and not clear what his blood sugar patterns are  Although his fasting blood sugars are reportedly near normal limits to have postprandial hyperglycemia but he has difficulty remembering his readings He has lost weight with decreased appetite secondary to neck pain  2. Hypertension: Well controlled and will continue Lotensin  3.  History of anemia: Will check today.  He refuses to do a stool Hemoccult now   [facility] 11/26/2014, 8:55 PM      Addendum: Hemoglobin low at 11.2, will need to mail him Hemoccult cards and return in 6 weeks for follow-up

## 2014-12-02 ENCOUNTER — Ambulatory Visit: Payer: Medicare Other | Admitting: Endocrinology

## 2014-12-14 ENCOUNTER — Ambulatory Visit: Payer: Medicare Other | Admitting: Diagnostic Neuroimaging

## 2015-01-04 ENCOUNTER — Other Ambulatory Visit: Payer: Medicare Other

## 2015-01-06 DIAGNOSIS — Z6832 Body mass index (BMI) 32.0-32.9, adult: Secondary | ICD-10-CM | POA: Diagnosis not present

## 2015-01-06 DIAGNOSIS — M47812 Spondylosis without myelopathy or radiculopathy, cervical region: Secondary | ICD-10-CM | POA: Diagnosis not present

## 2015-01-06 DIAGNOSIS — M5030 Other cervical disc degeneration, unspecified cervical region: Secondary | ICD-10-CM | POA: Diagnosis not present

## 2015-01-06 DIAGNOSIS — M542 Cervicalgia: Secondary | ICD-10-CM | POA: Diagnosis not present

## 2015-01-08 ENCOUNTER — Other Ambulatory Visit: Payer: Medicare Other

## 2015-01-08 ENCOUNTER — Ambulatory Visit: Payer: Medicare Other | Admitting: Endocrinology

## 2015-01-19 DIAGNOSIS — E11321 Type 2 diabetes mellitus with mild nonproliferative diabetic retinopathy with macular edema: Secondary | ICD-10-CM | POA: Diagnosis not present

## 2015-01-19 LAB — HM DIABETES EYE EXAM

## 2015-02-02 ENCOUNTER — Ambulatory Visit (INDEPENDENT_AMBULATORY_CARE_PROVIDER_SITE_OTHER): Payer: Medicare Other | Admitting: Urology

## 2015-02-02 DIAGNOSIS — N201 Calculus of ureter: Secondary | ICD-10-CM | POA: Diagnosis not present

## 2015-02-02 DIAGNOSIS — C61 Malignant neoplasm of prostate: Secondary | ICD-10-CM

## 2015-02-02 DIAGNOSIS — N500 Atrophy of testis: Secondary | ICD-10-CM | POA: Diagnosis not present

## 2015-02-02 DIAGNOSIS — Q540 Hypospadias, balanic: Secondary | ICD-10-CM | POA: Diagnosis not present

## 2015-02-10 ENCOUNTER — Encounter: Payer: Self-pay | Admitting: *Deleted

## 2015-02-25 ENCOUNTER — Ambulatory Visit (INDEPENDENT_AMBULATORY_CARE_PROVIDER_SITE_OTHER): Payer: Medicare Other | Admitting: Endocrinology

## 2015-02-25 ENCOUNTER — Encounter: Payer: Self-pay | Admitting: Endocrinology

## 2015-02-25 VITALS — BP 136/78 | HR 59 | Temp 98.3°F | Resp 16 | Ht 70.0 in | Wt 223.8 lb

## 2015-02-25 DIAGNOSIS — Z794 Long term (current) use of insulin: Secondary | ICD-10-CM

## 2015-02-25 DIAGNOSIS — D508 Other iron deficiency anemias: Secondary | ICD-10-CM

## 2015-02-25 DIAGNOSIS — D638 Anemia in other chronic diseases classified elsewhere: Secondary | ICD-10-CM | POA: Diagnosis not present

## 2015-02-25 DIAGNOSIS — IMO0002 Reserved for concepts with insufficient information to code with codable children: Secondary | ICD-10-CM

## 2015-02-25 DIAGNOSIS — I10 Essential (primary) hypertension: Secondary | ICD-10-CM | POA: Diagnosis not present

## 2015-02-25 DIAGNOSIS — E1165 Type 2 diabetes mellitus with hyperglycemia: Secondary | ICD-10-CM

## 2015-02-25 LAB — CBC WITH DIFFERENTIAL/PLATELET
Basophils Absolute: 0 10*3/uL (ref 0.0–0.1)
Basophils Relative: 0.4 % (ref 0.0–3.0)
EOS PCT: 2.8 % (ref 0.0–5.0)
Eosinophils Absolute: 0.2 10*3/uL (ref 0.0–0.7)
HEMATOCRIT: 37.6 % — AB (ref 39.0–52.0)
HEMOGLOBIN: 12.3 g/dL — AB (ref 13.0–17.0)
LYMPHS PCT: 26.7 % (ref 12.0–46.0)
Lymphs Abs: 1.8 10*3/uL (ref 0.7–4.0)
MCHC: 32.8 g/dL (ref 30.0–36.0)
MCV: 92.3 fl (ref 78.0–100.0)
MONOS PCT: 7.3 % (ref 3.0–12.0)
Monocytes Absolute: 0.5 10*3/uL (ref 0.1–1.0)
Neutro Abs: 4.2 10*3/uL (ref 1.4–7.7)
Neutrophils Relative %: 62.8 % (ref 43.0–77.0)
Platelets: 228 10*3/uL (ref 150.0–400.0)
RBC: 4.07 Mil/uL — AB (ref 4.22–5.81)
RDW: 14.5 % (ref 11.5–15.5)
WBC: 6.7 10*3/uL (ref 4.0–10.5)

## 2015-02-25 LAB — COMPREHENSIVE METABOLIC PANEL
ALK PHOS: 48 U/L (ref 39–117)
ALT: 16 U/L (ref 0–53)
AST: 19 U/L (ref 0–37)
Albumin: 4.1 g/dL (ref 3.5–5.2)
BUN: 27 mg/dL — ABNORMAL HIGH (ref 6–23)
CALCIUM: 9.8 mg/dL (ref 8.4–10.5)
CO2: 28 mEq/L (ref 19–32)
Chloride: 106 mEq/L (ref 96–112)
Creatinine, Ser: 1.17 mg/dL (ref 0.40–1.50)
GFR: 64.7 mL/min (ref >60.00)
Glucose, Bld: 104 mg/dL — ABNORMAL HIGH (ref 70–99)
POTASSIUM: 5 meq/L (ref 3.5–5.1)
Sodium: 139 mEq/L (ref 135–145)
TOTAL PROTEIN: 7.4 g/dL (ref 6.0–8.3)
Total Bilirubin: 0.3 mg/dL (ref 0.2–1.2)

## 2015-02-25 LAB — IBC PANEL
Iron: 96 ug/dL (ref 42–165)
SATURATION RATIOS: 29.8 % (ref 20.0–50.0)
TRANSFERRIN: 230 mg/dL (ref 212.0–360.0)

## 2015-02-25 LAB — HEMOGLOBIN A1C: Hgb A1c MFr Bld: 7.2 % — ABNORMAL HIGH (ref 4.6–6.5)

## 2015-02-25 NOTE — Patient Instructions (Signed)
Do the clicks just before eating   Extra 1 click for big snack or cookie  Send Hemoccult

## 2015-02-25 NOTE — Progress Notes (Signed)
Patient ID: Jonathan Hensley, male   DOB: Oct 25, 1940, 74 y.o.   MRN: [id number]   Reason for Appointment: Diabetes follow-up   History of Present Illness    Type 2 diabetes mellitus, date of diagnosis: 1994.   He has been on insulin for the last few years, starting with only bedtime NPH and subsequently progressing to basal bolus regimen Also taking glipizide and metformin long-term  The HbgA1c was highest in 2013 at 8.2 Because of more fluctuation in his blood sugars at all times, difficulty with compliance and inconvenience of the multiple insulin dose regimen as well as needing higher doses of insulin he was started on the V.-go pump in 05/2013  Recent history:   The insulin regimen is: basal rate of 20 units on V- go pump; boluses with carbohydrate coverage 1:10; usually 4-6 units 2-3 times a day   He is using the 20 basal on his V-go pump and had been instructed on boluses at meals and snacks with carbohydrate counting  His A1c has been still relatively higher over 7% but improving now  Current blood sugar patterns and problems identified:  His A1c in house today is better at 7.2  He still checks his blood sugars multiple times a day  Appears to have marked variability in his blood sugars during the day especially around supper time  Fasting blood sugars are generally fairly close to desired range and lowest reading 75 early morning  He has a tendency to high readings late morning but does not always do blood sugars 2 hours after eating; he is fairly consistent with bolusing before eating in the morning and taking 4 units for about 40 g of carbohydrate  Some of his high readings are related to snacks without boluses  Sporadically has high readings after supper when he has an extra snack after eating his meal  Not able to do much physical activity . He knows how to count carbohydrates and is using 1:10 coverage  Difficulties with medication adherence: Bolusing for  meals after eating at times, not covering snacks or desserts with boluses   Glucose monitoring: Accu-Chek, checking 3-4 times a day  Blood Glucose readings:   Mean values apply above for all meters except median for One Touch  PRE-MEAL Fasting Lunch Dinner Bedtime Overall  Glucose range:  75-161    70-236   114-265    Mean/median: 120  144   144   155  146    Wt Readings from Last 3 Encounters:  02/25/15 223 lb 12.8 oz (101.515 kg)  11/26/14 223 lb 6.4 oz (101.334 kg)  11/14/14 220 lb (99.791 kg)   Meals: 2 meals per day and usually no lunch.   Carbohydrate intake: low; Bfst eggs  Sometimes eating out and usually lunch is the larger meal of the day, snacks peanuts. 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  Diabetic nephropathy: Occasional mild microalbuminuria     Lab Results  Component Value Date   HGBA1C 7.2* 02/25/2015   HGBA1C 7.6* 11/26/2014   HGBA1C 7.3 11/26/2014   Lab Results  Component Value Date   MICROALBUR 3.2* 11/26/2014   LDLCALC 45 11/26/2014   CREATININE 1.17 02/25/2015         Medication List       This list is accurate as of: 02/25/15 11:59 PM.  Always use your most recent med list.  benazepril 40 MG tablet  Commonly known as:  LOTENSIN  Take 40 mg by mouth at bedtime.     DUREZOL 0.05 % Emul  Generic drug:  Difluprednate     ELIQUIS 5 MG Tabs tablet  Generic drug:  apixaban  Take 5 mg by mouth 2 (two) times daily.     ezetimibe 10 MG tablet  Commonly known as:  ZETIA  Take 10 mg by mouth at bedtime.     fluticasone 0.05 % cream  Commonly known as:  CUTIVATE  Apply topically 2 (two) times daily.     freestyle lancets  Use as instructed to check blood sugar 4 times per day dx code 250.00     glucose blood test strip  Commonly known as:  FREESTYLE LITE  USE TO CHECK BLOOD SUGAR 4 TIMES DAILY AS INSTRUCTED. DX: E11.9      HYDROcodone-acetaminophen 5-325 MG tablet  Commonly known as:  NORCO/VICODIN  1 tab PO q12 hours prn pain     ketoconazole 2 % cream  Commonly known as:  NIZORAL  Apply 1 application topically 2 (two) times daily. Use as directed     metFORMIN 1000 MG tablet  Commonly known as:  GLUCOPHAGE  TAKE 1 TABLET TWICE A DAY WITH MEALS     methocarbamol 500 MG tablet  Commonly known as:  ROBAXIN  Take 1 tablet (500 mg total) by mouth 2 (two) times daily as needed for muscle spasms.     NOVOLOG 100 UNIT/ML injection  Generic drug:  insulin aspart  USE UP TO 56 UNITS IN V-GO PUMP DAILY AS DIRECTED     PYRIDOXINE HCL PO  Take 1 tablet by mouth daily.     simvastatin 20 MG tablet  Commonly known as:  ZOCOR  Take 20 mg by mouth at bedtime.     THIAMINE PO  Take 1 tablet by mouth daily.     V-GO 20 Kit  USE ONE PER DAY     VITAMIN B 12 PO  Take 1 tablet by mouth daily.        Allergies:  Allergies  Allergen Reactions  . Other Other (See Comments)    Beta or alpha blockers: severe dizziness  . Tramadol Other (See Comments)    Dizziness     Past Medical History  Diagnosis Date  . History of prostate cancer   . Hypertension   . Diabetes mellitus   . Nephrolithiasis   . Diabetic retinopathy ([place])   . Bulging lumbar disc   . Dysrhythmia     Atrial Fibrillation  . Sleep apnea     uses cpap  . Arthritis     rheumatoid in his hands  . Cancer ([place])   . Low back pain   . Double vision   . DDD (degenerative disc disease), cervical     Past Surgical History  Procedure Laterality Date  . Back surgery    . Brachytherapy    . Cystoscopy    . Left l4-l5 lumbar laminotomy and microdiskectomy with  07/14/2008    . Scleral buckle, laser photocoagulation, and anterior chamber  01/04/2009  . Bilateral l4-5 lumbar decompression including bilateral  11/2008  . Lithotripsy    . Appendectomy    . Rotator cuff surgery    . Colonoscopy    . Lumbar laminectomy/decompression  microdiscectomy Left 08/18/2013    Procedure: Left Lumbar Five-Sacral One Microdiskectomy;  Surgeon: [name], MD;  Location: [place] [facility];  Service: Neurosurgery;  Laterality: Left;  Left Lumbar Five-Sacral One Microdiskectomy    Family History  Problem Relation Age of Onset  . Heart failure Father   . Heart failure Mother   . Heart failure Brother     Half brother.    Social History:  reports that he quit smoking about 42 years ago. He has never used smokeless tobacco. He reports that he does not drink alcohol or use illicit drugs.  Review of Systems   He has had neck pain and neurosurgeon has evaluated him, taking mostly Tylenol now  Hypertension:  Blood pressure is being treated  with benazepril alone, also followed by cardiologist  He is taking sodium bicarbonate for his history of kidney stones from urologist  HYPERLIPIDEMIA: Has been on simvastatin and Zetia  with good results  Lab Results  Component Value Date   CHOL 115 11/26/2014   HDL 42.10 11/26/2014   LDLCALC 45 11/26/2014   TRIG 139.0 11/26/2014   CHOLHDL 3 11/26/2014     He has had retinal detachment treated surgically in the past, followed by ophthalmologist  Sleep apnea, is on CPAP and has been monitored by neurologist   Anemia: This was more significant on his last visit but he did not do his stool Hemoccult as directed He has not had a colonoscopy in 15 years and absolutely refuses to do this   Lab Results  Component Value Date   WBC 6.7 02/25/2015   HGB 12.3* 02/25/2015   HCT 37.6* 02/25/2015   MCV 92.3 02/25/2015   PLT 228.0 02/25/2015   Diabetic foot exam done in 4/16   Examination:   BP 136/78 mmHg  Pulse 59  Temp(Src) 98.3 F (36.8 C)  Resp 16  Ht 5' 10"  (1.778 m)  Wt 223 lb 12.8 oz (101.515 kg)  BMI 32.11 kg/m2  SpO2 96%  Body mass index is 32.11 kg/(m^2).    No edema  Assesment:   1. Diabetes type 2, uncontrolled  The patient's diabetes control is overall  reasonably good with A1c 7.2  See history of present illness for detailed discussion of current blood sugar patterns, problems identified and day-to-day management  He generally not eating as much at lunchtime but his blood sugar still periodically high before supper  He is not able to does accurately how much to take for his meals. May need a different carbohydrate ratio at breakfast as blood sugars are periodically higher late morning Also may not be bolusing for snacks  Discussed need to take an extra click if eating dessert after his evening meal FASTING sugars are excellent and indicate fairly good basal rate with this pump  2. Hypertension: Well controlled and will continue Lotensin  3.  Worsening anemia: Will check again today.  He refuses to do a colonoscopy but agrees to do stool Hemoccult now.  Discussed possibility of having a precancerous lesion which may not be picked up in the Hemoccult  Counseling time on subjects discussed above is over 50% of today's 25 minute visit  Shirl Weir 02/26/2015, 5:02 PM     Addendum: Hemoglobin better at 12.3, awaiting Hemoccult

## 2015-02-26 ENCOUNTER — Ambulatory Visit: Payer: Medicare Other | Admitting: Endocrinology

## 2015-02-26 NOTE — Progress Notes (Signed)
Quick Note:  Please let patient know that the anemia test is slightly better, no need for iron but need him to send his Hemoccult Kidney/liver functions okay ______

## 2015-03-01 ENCOUNTER — Other Ambulatory Visit: Payer: Self-pay | Admitting: *Deleted

## 2015-03-01 DIAGNOSIS — Z23 Encounter for immunization: Secondary | ICD-10-CM

## 2015-03-10 ENCOUNTER — Other Ambulatory Visit: Payer: Self-pay | Admitting: Endocrinology

## 2015-03-18 ENCOUNTER — Other Ambulatory Visit: Payer: Medicare Other

## 2015-03-22 ENCOUNTER — Other Ambulatory Visit: Payer: Self-pay | Admitting: *Deleted

## 2015-03-22 ENCOUNTER — Other Ambulatory Visit: Payer: Medicare Other

## 2015-03-22 ENCOUNTER — Other Ambulatory Visit (INDEPENDENT_AMBULATORY_CARE_PROVIDER_SITE_OTHER): Payer: Medicare Other

## 2015-03-22 DIAGNOSIS — Z1211 Encounter for screening for malignant neoplasm of colon: Secondary | ICD-10-CM

## 2015-03-22 LAB — FECAL OCCULT BLOOD, IMMUNOCHEMICAL: Fecal Occult Bld: NEGATIVE

## 2015-03-23 NOTE — Progress Notes (Signed)
Quick Note:  Please let patient know that the lab result is normal and no further action needed ______ 

## 2015-04-20 DIAGNOSIS — E113212 Type 2 diabetes mellitus with mild nonproliferative diabetic retinopathy with macular edema, left eye: Secondary | ICD-10-CM | POA: Diagnosis not present

## 2015-04-20 LAB — HM DIABETES EYE EXAM

## 2015-05-03 DIAGNOSIS — E113212 Type 2 diabetes mellitus with mild nonproliferative diabetic retinopathy with macular edema, left eye: Secondary | ICD-10-CM | POA: Diagnosis not present

## 2015-05-03 LAB — HM DIABETES EYE EXAM

## 2015-05-18 ENCOUNTER — Other Ambulatory Visit: Payer: Self-pay | Admitting: *Deleted

## 2015-05-18 MED ORDER — V-GO 20 KIT: PACK | Status: DC

## 2015-05-19 ENCOUNTER — Encounter: Payer: Self-pay | Admitting: *Deleted

## 2015-05-28 ENCOUNTER — Ambulatory Visit: Payer: Medicare Other | Admitting: Endocrinology

## 2015-06-01 LAB — HM DIABETES EYE EXAM

## 2015-06-16 ENCOUNTER — Ambulatory Visit (INDEPENDENT_AMBULATORY_CARE_PROVIDER_SITE_OTHER): Payer: Medicare Other | Admitting: Endocrinology

## 2015-06-16 ENCOUNTER — Encounter: Payer: Self-pay | Admitting: Endocrinology

## 2015-06-16 VITALS — BP 124/62 | HR 67 | Temp 98.7°F | Resp 16 | Ht 70.0 in | Wt 232.2 lb

## 2015-06-16 DIAGNOSIS — R5383 Other fatigue: Secondary | ICD-10-CM | POA: Diagnosis not present

## 2015-06-16 DIAGNOSIS — E1140 Type 2 diabetes mellitus with diabetic neuropathy, unspecified: Secondary | ICD-10-CM

## 2015-06-16 DIAGNOSIS — Z794 Long term (current) use of insulin: Secondary | ICD-10-CM

## 2015-06-16 DIAGNOSIS — D638 Anemia in other chronic diseases classified elsewhere: Secondary | ICD-10-CM | POA: Diagnosis not present

## 2015-06-16 DIAGNOSIS — E1165 Type 2 diabetes mellitus with hyperglycemia: Secondary | ICD-10-CM | POA: Diagnosis not present

## 2015-06-16 DIAGNOSIS — E538 Deficiency of other specified B group vitamins: Secondary | ICD-10-CM

## 2015-06-16 DIAGNOSIS — E119 Type 2 diabetes mellitus without complications: Secondary | ICD-10-CM | POA: Diagnosis not present

## 2015-06-16 DIAGNOSIS — D508 Other iron deficiency anemias: Secondary | ICD-10-CM

## 2015-06-16 LAB — TSH: TSH: 2.18 u[IU]/mL (ref 0.35–4.50)

## 2015-06-16 LAB — POCT GLYCOSYLATED HEMOGLOBIN (HGB A1C): Hemoglobin A1C: 7.5

## 2015-06-16 LAB — BASIC METABOLIC PANEL
BUN: 34 mg/dL — ABNORMAL HIGH (ref 6–23)
CO2: 25 mEq/L (ref 19–32)
Calcium: 9.8 mg/dL (ref 8.4–10.5)
Chloride: 105 mEq/L (ref 96–112)
Creatinine, Ser: 1.34 mg/dL (ref 0.40–1.50)
GFR: 55.28 mL/min — ABNORMAL LOW (ref >60.00)
GLUCOSE: 162 mg/dL — AB (ref 70–99)
POTASSIUM: 4.6 meq/L (ref 3.5–5.1)
SODIUM: 141 meq/L (ref 135–145)

## 2015-06-16 LAB — CBC
HCT: 37.1 % — ABNORMAL LOW (ref 39.0–52.0)
HEMOGLOBIN: 12.1 g/dL — AB (ref 13.0–17.0)
MCHC: 32.7 g/dL (ref 30.0–36.0)
MCV: 93.1 fl (ref 78.0–100.0)
PLATELETS: 237 10*3/uL (ref 150.0–400.0)
RBC: 3.98 Mil/uL — ABNORMAL LOW (ref 4.22–5.81)
RDW: 13.4 % (ref 11.5–15.5)
WBC: 6.4 10*3/uL (ref 4.0–10.5)

## 2015-06-16 LAB — T4, FREE: Free T4: 0.92 ng/dL (ref 0.60–1.60)

## 2015-06-16 LAB — VITAMIN B12: Vitamin B-12: 461 pg/mL (ref 211–911)

## 2015-06-16 NOTE — Progress Notes (Signed)
Patient ID: Jonathan Hensley, male   DOB: 1940/12/28, 75 y.o.   MRN: [id number]   Reason for Appointment: Diabetes follow-up   History of Present Illness    Type 2 diabetes mellitus, date of diagnosis: 1994.   He has been on insulin for the last few years, starting with only bedtime NPH and subsequently progressing to basal bolus regimen Also taking glipizide and metformin long-term  The HbgA1c was highest in 2013 at 8.2 Because of more fluctuation in his blood sugars at all times, difficulty with compliance and inconvenience of the multiple insulin dose regimen as well as needing higher doses of insulin he was started on the V.-go pump in 05/2013  Recent history:   The insulin regimen is: basal rate of 20 units on V- go pump; boluses with carbohydrate coverage 1:10; usually 4-6 units 2-3 times a day   His A1c has been relatively higher over 7%  And now up to 7.5   Current blood sugar patterns and problems identified:  Blood sugars are fluctuating significantly at all times especially nonfasting   He says that he has had stress and sometimes will not be consistent with his boluses   not clear why some readings are significantly high early morning    He is not always eating lunch , blood sugars at suppertime are variable but recently better   he will take  6 units as a correction dose when his blood sugars over 200 and occasionally has had low sugars with doing this in the morning and evening  Not able to do much physical activity . He knows how to count carbohydrates and is using 1:10 coverage  Difficulties with medication adherence: Bolusing for meals after eating at times, not covering snacks or desserts with boluses   Glucose monitoring: Accu-Chek, checking 3-4 times a day  Blood Glucose readings:   Mean values apply above for all meters except median for One Touch  PRE-MEAL Fasting Lunch Dinner Bedtime Overall  Glucose range:  59- 264  102-278  129-308  99-243     Mean/median:  160   180  160  169+/- 51     Wt Readings from Last 3 Encounters:  06/16/15 232 lb 3.2 oz (105.325 kg)  02/25/15 223 lb 12.8 oz (101.515 kg)  11/26/14 223 lb 6.4 oz (101.334 kg)   Meals: 2 meals per day and usually no lunch.    Sometimes eating out and usually  Dinner is the larger meal of the day, snacks peanuts. 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  Always use your most recent med list.               benazepril 40 MG tablet  Commonly known as:  LOTENSIN  Take 40 mg by mouth at bedtime.     DUREZOL 0.05 % Emul  Generic drug:  Difluprednate     ELIQUIS 5 MG  Tabs tablet  Generic drug:  apixaban  Take 5 mg by mouth 2 (two) times daily.     ezetimibe 10 MG tablet  Commonly known as:  ZETIA  Take 10 mg by mouth at bedtime.     fluticasone 0.05 % cream  Commonly known as:  CUTIVATE  Apply topically 2 (two) times daily.     freestyle lancets  Use as instructed to check blood sugar 4 times per day dx code 250.00     glucose blood test strip  Commonly known as:  FREESTYLE LITE  USE TO CHECK BLOOD SUGAR 4 TIMES DAILY AS INSTRUCTED. DX: E11.9     ketoconazole 2 % cream  Commonly known as:  NIZORAL  Apply 1 application topically 2 (two) times daily. Use as directed     metFORMIN 1000 MG tablet  Commonly known as:  GLUCOPHAGE  TAKE 1 TABLET TWICE A DAY WITH MEALS     NOVOLOG 100 UNIT/ML injection  Generic drug:  insulin aspart  USE UP TO 56 UNITS IN V-GO PUMP DAILY AS  DIRECTED     PYRIDOXINE HCL PO  Take 1 tablet by mouth daily.     simvastatin 20 MG tablet  Commonly known as:  ZOCOR  Take 20 mg by mouth at bedtime.     THIAMINE PO  Take 1 tablet by mouth daily.     V-GO 20 Kit  USE ONE PER DAY     VITAMIN B 12 PO  Take 1 tablet by mouth daily.        Allergies:  Allergies  Allergen Reactions  . Other Other (See Comments)    Beta or alpha blockers: severe dizziness  . Tramadol Other (See Comments)    Dizziness     Past Medical History  Diagnosis Date  . History of prostate cancer   . Hypertension   . Diabetes mellitus   . Nephrolithiasis   . Diabetic retinopathy ([place])   . Bulging lumbar disc   . Dysrhythmia     Atrial Fibrillation  . Sleep apnea     uses cpap  . Arthritis     rheumatoid in his hands  . Cancer ([place])   . Low back pain   . Double vision   . DDD (degenerative disc disease), cervical     Past Surgical History  Procedure Laterality Date  . Back surgery    . Brachytherapy    . Cystoscopy    . Left l4-l5 lumbar laminotomy and microdiskectomy with  07/14/2008    . Scleral buckle, laser photocoagulation, and anterior chamber  01/04/2009  . Bilateral l4-5 lumbar decompression including bilateral  11/2008  . Lithotripsy    . Appendectomy    . Rotator cuff surgery    . Colonoscopy    . Lumbar laminectomy/decompression microdiscectomy Left 08/18/2013    Procedure: Left Lumbar Five-Sacral One Microdiskectomy;  Surgeon: [name], MD;  Location: [facility];  Service: Neurosurgery;  Laterality: Left;  Left Lumbar Five-Sacral One Microdiskectomy    Family History  Problem Relation Age of Onset  . Heart failure Father   . Heart failure Mother   . Heart failure Brother     Half brother.    Social History:  reports that he quit smoking about 42 years ago. He has never used smokeless tobacco. He reports that he does not drink alcohol or use illicit drugs.  Review of Systems     He says that for the  last week or  so he has been feeling more tired and sleepy and has been taking iron on his own  He has not expressed any depression but has had more stress especially with wife's illness   He continues to have some late insomnia with he thinks this is not new   Hypertension:  Blood pressure is being treated  with benazepril alone, also followed by cardiologist  He is taking sodium bicarbonate for his history of kidney stones from urologist  HYPERLIPIDEMIA: Has been on simvastatin and Zetia with good results , is consistent with this  Lab Results  Component Value Date   CHOL 115 11/26/2014   HDL 42.10 11/26/2014   LDLCALC 45 11/26/2014   TRIG 139.0 11/26/2014   CHOLHDL 3 11/26/2014     He has had retinal detachment treated surgically in the past, followed by ophthalmologist  Sleep apnea, is on CPAP and has been monitored by neurologist   Anemia :Had improved on his last visit Hemoccult is negative He has not had a colonoscopy in 15 years and absolutely refuses to do this  Last   Iron saturation was normal also. He is taking OTC B-12 on his own   Lab Results  Component Value Date   WBC 6.7 02/25/2015   HGB 12.3* 02/25/2015   HCT 37.6* 02/25/2015   MCV 92.3 02/25/2015   PLT 228.0 02/25/2015   Diabetic foot exam done in 4/16   Examination:   BP 124/62 mmHg  Pulse 67  Temp(Src) 98.7 F (37.1 C)  Resp 16  Ht 5' 10" (1.778 m)  Wt 232 lb 3.2 oz (105.325 kg)  BMI 33.32 kg/m2  SpO2 97%  Body mass index is 33.32 kg/(m^2).    Assesment:   1. Diabetes type 2, uncontrolled  See history of present illness for detailed discussion of current blood sugar patterns, problems identified and day-to-day management His blood sugars are fluctuating significantly with no consistent pattern except possibly high readings at lunchtime Since he does not have any consistently high readings after breakfast will not increase his boluses yet Also may not be bolusing for snacks which may  raise her blood sugar, occasionally because of his preoccupation with other issues and family problems he may not be bolusing on time   He does have periodic high readings fasting and not clear of the reason.  Since he already has some readings that are near normal fasting will not increase his basal rate Also recommended he takes only 4 units bolus and set of 6 when blood sugars over 200 for correction otherwise he tends to get hypoglycemic   2. Hypertension: Well controlled and will continue Lotensin  3.  Mild anemia: Will check again today along with iron and B12 levels, may need to adjust his B12 doses  4.  Fatigue: Most likely related to stress and some chronic late insomnia but will check thyroid levels also  Counseling time on subjects discussed above is over 50% of today's 25 minute visit  , 06/16/2015, 2:53 PM

## 2015-06-16 NOTE — Patient Instructions (Addendum)
Take only 2 clicks if sugar over [id number]  Hold iron  Check blood sugars on waking up 5  times a week Also check blood sugars about 2 hours after a meal and do this after different meals by rotation  Recommended blood sugar levels on waking up is 90-130 and about 2 hours after meal is 130-160  Please bring your blood sugar monitor to each visit, thank you

## 2015-06-17 NOTE — Progress Notes (Signed)
Quick Note:  Please let patient know that all tests are okay including thyroid, B12 and no need for iron ______

## 2015-07-13 DIAGNOSIS — E113212 Type 2 diabetes mellitus with mild nonproliferative diabetic retinopathy with macular edema, left eye: Secondary | ICD-10-CM | POA: Diagnosis not present

## 2015-07-13 LAB — HM DIABETES EYE EXAM

## 2015-07-24 ENCOUNTER — Other Ambulatory Visit: Payer: Self-pay | Admitting: Endocrinology

## 2015-07-27 ENCOUNTER — Ambulatory Visit (INDEPENDENT_AMBULATORY_CARE_PROVIDER_SITE_OTHER): Payer: Medicare Other | Admitting: Urology

## 2015-07-27 DIAGNOSIS — C61 Malignant neoplasm of prostate: Secondary | ICD-10-CM | POA: Diagnosis not present

## 2015-07-27 DIAGNOSIS — N500 Atrophy of testis: Secondary | ICD-10-CM | POA: Diagnosis not present

## 2015-07-27 DIAGNOSIS — Q540 Hypospadias, balanic: Secondary | ICD-10-CM | POA: Diagnosis not present

## 2015-07-27 DIAGNOSIS — N200 Calculus of kidney: Secondary | ICD-10-CM | POA: Diagnosis not present

## 2015-08-28 ENCOUNTER — Other Ambulatory Visit: Payer: Self-pay | Admitting: Endocrinology

## 2015-09-02 ENCOUNTER — Encounter: Payer: Self-pay | Admitting: *Deleted

## 2015-09-04 ENCOUNTER — Other Ambulatory Visit: Payer: Self-pay | Admitting: Endocrinology

## 2015-09-07 DIAGNOSIS — E113212 Type 2 diabetes mellitus with mild nonproliferative diabetic retinopathy with macular edema, left eye: Secondary | ICD-10-CM | POA: Diagnosis not present

## 2015-09-08 DIAGNOSIS — I480 Paroxysmal atrial fibrillation: Secondary | ICD-10-CM | POA: Diagnosis not present

## 2015-09-08 DIAGNOSIS — Z794 Long term (current) use of insulin: Secondary | ICD-10-CM | POA: Diagnosis not present

## 2015-09-08 DIAGNOSIS — I10 Essential (primary) hypertension: Secondary | ICD-10-CM | POA: Diagnosis not present

## 2015-09-08 DIAGNOSIS — E1165 Type 2 diabetes mellitus with hyperglycemia: Secondary | ICD-10-CM | POA: Diagnosis not present

## 2015-09-14 DIAGNOSIS — H25812 Combined forms of age-related cataract, left eye: Secondary | ICD-10-CM | POA: Diagnosis not present

## 2015-09-14 DIAGNOSIS — H35371 Puckering of macula, right eye: Secondary | ICD-10-CM | POA: Diagnosis not present

## 2015-09-14 DIAGNOSIS — E113212 Type 2 diabetes mellitus with mild nonproliferative diabetic retinopathy with macular edema, left eye: Secondary | ICD-10-CM | POA: Diagnosis not present

## 2015-09-14 DIAGNOSIS — H527 Unspecified disorder of refraction: Secondary | ICD-10-CM | POA: Diagnosis not present

## 2015-10-14 ENCOUNTER — Ambulatory Visit: Payer: Medicare Other | Admitting: Endocrinology

## 2015-10-18 ENCOUNTER — Ambulatory Visit (INDEPENDENT_AMBULATORY_CARE_PROVIDER_SITE_OTHER): Payer: Medicare Other | Admitting: Endocrinology

## 2015-10-18 ENCOUNTER — Encounter: Payer: Self-pay | Admitting: Endocrinology

## 2015-10-18 VITALS — BP 136/68 | HR 70 | Temp 98.6°F | Resp 16 | Ht 72.0 in | Wt 232.8 lb

## 2015-10-18 DIAGNOSIS — E1165 Type 2 diabetes mellitus with hyperglycemia: Secondary | ICD-10-CM

## 2015-10-18 DIAGNOSIS — E11319 Type 2 diabetes mellitus with unspecified diabetic retinopathy without macular edema: Secondary | ICD-10-CM | POA: Diagnosis not present

## 2015-10-18 DIAGNOSIS — Z794 Long term (current) use of insulin: Secondary | ICD-10-CM

## 2015-10-18 LAB — POCT GLYCOSYLATED HEMOGLOBIN (HGB A1C): HEMOGLOBIN A1C: 7.3

## 2015-10-18 NOTE — Progress Notes (Signed)
Patient ID: GERASIMOS PLOTTS, male   DOB: [birth year], 75 y.o.   MRN: [id number]   Reason for Appointment: Diabetes follow-up   History of Present Illness    Type 2 diabetes mellitus, date of diagnosis: 1994.   He has been on insulin for the last few years, starting with only bedtime NPH and subsequently progressing to basal bolus regimen Also taking glipizide and metformin long-term  The HbgA1c was highest in 2013 at 8.2 Because of more fluctuation in his blood sugars at all times, difficulty with compliance and inconvenience of the multiple insulin dose regimen as well as needing higher doses of insulin he was started on the V.-go pump in 05/2013  Recent history:   The insulin regimen is: basal rate of 20 units on V- go pump; boluses with carbohydrate coverage 1:10; usually 4-6 units 2-3 times a day   His A1c has been relatively higher over 7% but it is slightly better at 7.3, previously 7.5  Current blood sugar patterns and problems identified:  Blood sugars could not be evaluated as he forgot his meter  Usually has fluctuating blood sugar levels but he claims that these are fairly good without hypoglycemia  Not clear if he is checking enough post prandial readings which tend to be high at times  Also has periodic difficulty with consistently not doing his boluses for meals  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 He knows how to count carbohydrates and is using 1:10 coverage  Difficulties with medication adherence: Bolusing for meals after eating at times, not covering snacks or desserts with boluses   Glucose monitoring: Accu-Chek, checking 3-4 times a day  Blood Glucose readings: As above, usually no more than 170    Wt Readings from Last 3 Encounters:  10/18/15 232 lb 12.8 oz (105.597 kg)  06/16/15 232 lb 3.2 oz (105.325 kg)  02/25/15 223 lb 12.8 oz (101.515 kg)   Meals: 2 meals per day and usually no lunch.    Sometimes eating out and usually   Dinner is the larger meal of the day, snacks peanuts. 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  Always use your most recent med list.               benazepril 40 MG tablet  Commonly known as:  LOTENSIN  Take 40 mg by mouth at bedtime.     DUREZOL 0.05 % Emul  Generic drug:  Difluprednate  Reported on 10/18/2015     ELIQUIS 5 MG Tabs tablet  Generic drug:  apixaban  Take 5 mg by mouth 2 (two) times daily.     ezetimibe 10 MG tablet  Commonly known as:  ZETIA  Take 10 mg by mouth at bedtime.     fluticasone 0.05 % cream  Commonly known as:  CUTIVATE  Apply topically 2 (two) times daily.  freestyle lancets  Use as instructed to check blood sugar 4 times per day dx code 250.00     FREESTYLE LITE test strip  Generic drug:  glucose blood  USE TO CHECK BLOOD SUGAR FOUR TIMES A DAY AS INSTRUCTED     ketoconazole 2 % cream  Commonly known as:  NIZORAL  Apply 1 application topically 2 (two) times daily. Use as directed     metFORMIN 1000 MG tablet  Commonly known as:  GLUCOPHAGE  TAKE 1 TABLET TWICE A DAY WITH MEALS     NOVOLOG 100 UNIT/ML injection  Generic drug:  insulin aspart  USE UP TO 56 UNITS IN V-GO PUMP DAILY AS DIRECTED     simvastatin 20 MG tablet  Commonly known as:  ZOCOR  Take 20 mg by mouth at bedtime.     V-GO 20 Kit  USE ONE PER DAY     VITAMIN B 12 PO  Take 1 tablet by mouth daily. Reported on 10/18/2015        Allergies:  Allergies  Allergen Reactions  . Other Other (See  Comments)    Beta or alpha blockers: severe dizziness  . Tramadol Other (See Comments)    Dizziness     Past Medical History  Diagnosis Date  . History of prostate cancer   . Hypertension   . Diabetes mellitus   . Nephrolithiasis   . Diabetic retinopathy ([place])   . Bulging lumbar disc   . Dysrhythmia     Atrial Fibrillation  . Sleep apnea     uses cpap  . Arthritis     rheumatoid in his hands  . Cancer ([place])   . Low back pain   . Double vision   . DDD (degenerative disc disease), cervical     Past Surgical History  Procedure Laterality Date  . Back surgery    . Brachytherapy    . Cystoscopy    . Left l4-l5 lumbar laminotomy and microdiskectomy with  07/14/2008    . Scleral buckle, laser photocoagulation, and anterior chamber  01/04/2009  . Bilateral l4-5 lumbar decompression including bilateral  11/2008  . Lithotripsy    . Appendectomy    . Rotator cuff surgery    . Colonoscopy    . Lumbar laminectomy/decompression microdiscectomy Left 08/18/2013    Procedure: Left Lumbar Five-Sacral One Microdiskectomy;  Surgeon: [name], MD;  Location: [facility];  Service: Neurosurgery;  Laterality: Left;  Left Lumbar Five-Sacral One Microdiskectomy    Family History  Problem Relation Age of Onset  . Heart failure Father   . Heart failure Mother   . Heart failure Brother     Half brother.    Social History:  reports that he quit smoking about 43 years ago. He has never used smokeless tobacco. He reports that he does not drink alcohol or use illicit drugs.  Review of Systems  No recent fatigue  Hypertension:  Blood pressure is being treated  with benazepril alone, also followed by cardiologist  He is taking sodium bicarbonate for his history of kidney stones from urologist  HYPERLIPIDEMIA: Has been on simvastatin and Zetia with good results , is consistent with this  Lab Results  Component Value Date   CHOL 115 11/26/2014   HDL 42.10 11/26/2014   LDLCALC  45 11/26/2014   TRIG 139.0 11/26/2014   CHOLHDL 3 11/26/2014     He has had retinal detachment treated surgically in the past, followed by ophthalmologist, Has decreased vision  in the left eye and is going to have a contract removed  Sleep apnea, is on CPAP and has been monitored by neurologist   Anemia :Had improved on his last visit Hemoccult  negative He has not had a colonoscopy in 15 years and absolutely refuses to do this  Last   Iron saturation was normal . He is taking OTC B-12 with adequate level   Lab Results  Component Value Date   WBC 6.4 06/16/2015   HGB 12.1* 06/16/2015   HCT 37.1* 06/16/2015   MCV 93.1 06/16/2015   PLT 237.0 06/16/2015   Diabetic foot exam done in 4/16   Examination:   BP 136/68 mmHg  Pulse 70  Temp(Src) 98.6 F (37 C)  Resp 16  Ht 6' (1.829 m)  Wt 232 lb 12.8 oz (105.597 kg)  BMI 31.57 kg/m2  SpO2 95%  Body mass index is 31.57 kg/(m^2).    Assesment:   1. Diabetes type 2, uncontrolled  See history of present illness for detailed discussion of current blood sugar patterns, problems identified and day-to-day management Unable to download his monitor as he forgot it and not clear what his blood sugar patterns are Usually glucose levels are inconsistent on previous evaluations However A1c is reasonably good at 7.3 He will continue his regimen unchanged and review blood sugars will be done by download on the next visit  2. Hypertension: Well controlled and will continue Lotensin  3.  Mild anemia: Will check again today, currently on B12     Chrishun Scheer 10/18/2015, 1:08 PM

## 2015-10-19 ENCOUNTER — Other Ambulatory Visit (INDEPENDENT_AMBULATORY_CARE_PROVIDER_SITE_OTHER): Payer: Medicare Other

## 2015-10-19 DIAGNOSIS — D638 Anemia in other chronic diseases classified elsewhere: Secondary | ICD-10-CM

## 2015-10-19 DIAGNOSIS — Z794 Long term (current) use of insulin: Secondary | ICD-10-CM | POA: Diagnosis not present

## 2015-10-19 DIAGNOSIS — E1165 Type 2 diabetes mellitus with hyperglycemia: Secondary | ICD-10-CM | POA: Diagnosis not present

## 2015-10-19 LAB — URINALYSIS, ROUTINE W REFLEX MICROSCOPIC
Bilirubin Urine: NEGATIVE
Ketones, ur: NEGATIVE
LEUKOCYTES UA: NEGATIVE
NITRITE: NEGATIVE
PH: 5 (ref 5.0–8.0)
RBC / HPF: NONE SEEN (ref 0–?)
Specific Gravity, Urine: 1.015 (ref 1.000–1.030)
TOTAL PROTEIN, URINE-UPE24: NEGATIVE
URINE GLUCOSE: NEGATIVE
Urobilinogen, UA: 0.2 (ref 0.0–1.0)
WBC, UA: NONE SEEN (ref 0–?)

## 2015-10-19 LAB — MICROALBUMIN / CREATININE URINE RATIO
Creatinine,U: 76.1 mg/dL
MICROALB/CREAT RATIO: 1.2 mg/g (ref 0.0–30.0)
Microalb, Ur: 0.9 mg/dL (ref 0.0–1.9)

## 2015-10-19 LAB — COMPREHENSIVE METABOLIC PANEL
ALBUMIN: 4.2 g/dL (ref 3.5–5.2)
ALK PHOS: 40 U/L (ref 39–117)
ALT: 12 U/L (ref 0–53)
AST: 14 U/L (ref 0–37)
BUN: 28 mg/dL — AB (ref 6–23)
CO2: 27 mEq/L (ref 19–32)
CREATININE: 1.25 mg/dL (ref 0.40–1.50)
Calcium: 9.4 mg/dL (ref 8.4–10.5)
Chloride: 105 mEq/L (ref 96–112)
GFR: 59.84 mL/min — ABNORMAL LOW (ref >60.00)
GLUCOSE: 212 mg/dL — AB (ref 70–99)
Potassium: 4.5 mEq/L (ref 3.5–5.1)
SODIUM: 137 meq/L (ref 135–145)
TOTAL PROTEIN: 7.1 g/dL (ref 6.0–8.3)
Total Bilirubin: 0.3 mg/dL (ref 0.2–1.2)

## 2015-10-19 LAB — LIPID PANEL
CHOLESTEROL: 147 mg/dL (ref 0–200)
HDL: 53.4 mg/dL (ref >39.00)
LDL CALC: 73 mg/dL (ref 0–99)
NonHDL: 93.13
Total CHOL/HDL Ratio: 3
Triglycerides: 99 mg/dL (ref 0.0–149.0)
VLDL: 19.8 mg/dL (ref 0.0–40.0)

## 2015-10-19 LAB — CBC
HCT: 37 % — ABNORMAL LOW (ref 39.0–52.0)
Hemoglobin: 12.2 g/dL — ABNORMAL LOW (ref 13.0–17.0)
MCHC: 32.9 g/dL (ref 30.0–36.0)
MCV: 92.6 fl (ref 78.0–100.0)
Platelets: 245 10*3/uL (ref 150.0–400.0)
RBC: 3.99 Mil/uL — ABNORMAL LOW (ref 4.22–5.81)
RDW: 13.1 % (ref 11.5–15.5)
WBC: 7 10*3/uL (ref 4.0–10.5)

## 2015-10-20 NOTE — Progress Notes (Signed)
Quick Note:  Please let patient know that the anemia test is stable, blood sugar was 212, may be needing 1 more click for covering breakfast ______

## 2015-11-04 ENCOUNTER — Other Ambulatory Visit: Payer: Self-pay | Admitting: Endocrinology

## 2015-11-08 DIAGNOSIS — H25812 Combined forms of age-related cataract, left eye: Secondary | ICD-10-CM | POA: Diagnosis not present

## 2015-11-08 DIAGNOSIS — E113212 Type 2 diabetes mellitus with mild nonproliferative diabetic retinopathy with macular edema, left eye: Secondary | ICD-10-CM | POA: Diagnosis not present

## 2015-11-09 DIAGNOSIS — E113212 Type 2 diabetes mellitus with mild nonproliferative diabetic retinopathy with macular edema, left eye: Secondary | ICD-10-CM | POA: Diagnosis not present

## 2015-11-23 DIAGNOSIS — H25812 Combined forms of age-related cataract, left eye: Secondary | ICD-10-CM | POA: Diagnosis not present

## 2015-11-23 DIAGNOSIS — Z889 Allergy status to unspecified drugs, medicaments and biological substances status: Secondary | ICD-10-CM | POA: Diagnosis not present

## 2015-11-23 DIAGNOSIS — G4730 Sleep apnea, unspecified: Secondary | ICD-10-CM | POA: Diagnosis not present

## 2015-11-23 DIAGNOSIS — I10 Essential (primary) hypertension: Secondary | ICD-10-CM | POA: Diagnosis not present

## 2015-11-23 DIAGNOSIS — Z886 Allergy status to analgesic agent status: Secondary | ICD-10-CM | POA: Diagnosis not present

## 2015-11-23 DIAGNOSIS — E113212 Type 2 diabetes mellitus with mild nonproliferative diabetic retinopathy with macular edema, left eye: Secondary | ICD-10-CM | POA: Diagnosis not present

## 2015-11-23 DIAGNOSIS — E785 Hyperlipidemia, unspecified: Secondary | ICD-10-CM | POA: Diagnosis not present

## 2015-11-23 DIAGNOSIS — Z794 Long term (current) use of insulin: Secondary | ICD-10-CM | POA: Diagnosis not present

## 2015-11-23 DIAGNOSIS — Z79899 Other long term (current) drug therapy: Secondary | ICD-10-CM | POA: Diagnosis not present

## 2015-11-23 DIAGNOSIS — H527 Unspecified disorder of refraction: Secondary | ICD-10-CM | POA: Diagnosis not present

## 2015-11-29 ENCOUNTER — Telehealth: Payer: Self-pay | Admitting: Endocrinology

## 2015-11-29 NOTE — Telephone Encounter (Signed)
PT just asks for a call back at your earliest convenience.

## 2015-11-30 NOTE — Telephone Encounter (Signed)
Requested a call back from the pt to discuss.  

## 2015-12-01 NOTE — Telephone Encounter (Signed)
I contacted the pt. Pt wanted to verify if you have received any life insurance paper work form him in the last weeks.  Please advise, Thanks!

## 2015-12-03 NOTE — Telephone Encounter (Signed)
See below, Thanks!  

## 2015-12-06 NOTE — Telephone Encounter (Signed)
Patient stated that it is crucial that his life insurance policy get filled out bt Dr Dwyane Dee as soon as possible. The main office has faxed it over twice, please advise and let him know if it was received.

## 2015-12-15 NOTE — Telephone Encounter (Signed)
I contacted the pt and advised [name] does not have the insurance policy paper work. I gave the pt our fax number [phone] and requested the paper work to be sent to that number. Pt voiced understanding.

## 2015-12-21 DIAGNOSIS — E113212 Type 2 diabetes mellitus with mild nonproliferative diabetic retinopathy with macular edema, left eye: Secondary | ICD-10-CM | POA: Diagnosis not present

## 2015-12-21 DIAGNOSIS — H527 Unspecified disorder of refraction: Secondary | ICD-10-CM | POA: Diagnosis not present

## 2015-12-31 ENCOUNTER — Encounter: Payer: Self-pay | Admitting: Endocrinology

## 2015-12-31 LAB — HM DIABETES EYE EXAM

## 2016-01-23 ENCOUNTER — Other Ambulatory Visit: Payer: Self-pay | Admitting: Endocrinology

## 2016-01-25 ENCOUNTER — Ambulatory Visit (INDEPENDENT_AMBULATORY_CARE_PROVIDER_SITE_OTHER): Payer: Medicare Other | Admitting: Urology

## 2016-01-25 DIAGNOSIS — C61 Malignant neoplasm of prostate: Secondary | ICD-10-CM | POA: Diagnosis not present

## 2016-02-10 DIAGNOSIS — Z23 Encounter for immunization: Secondary | ICD-10-CM | POA: Diagnosis not present

## 2016-02-17 ENCOUNTER — Ambulatory Visit (INDEPENDENT_AMBULATORY_CARE_PROVIDER_SITE_OTHER): Payer: Medicare Other | Admitting: Endocrinology

## 2016-02-17 ENCOUNTER — Encounter: Payer: Self-pay | Admitting: Endocrinology

## 2016-02-17 VITALS — BP 138/84 | HR 111 | Temp 97.7°F | Resp 16 | Ht 72.0 in | Wt 226.0 lb

## 2016-02-17 DIAGNOSIS — E119 Type 2 diabetes mellitus without complications: Secondary | ICD-10-CM | POA: Diagnosis not present

## 2016-02-17 DIAGNOSIS — D638 Anemia in other chronic diseases classified elsewhere: Secondary | ICD-10-CM | POA: Diagnosis not present

## 2016-02-17 DIAGNOSIS — E1165 Type 2 diabetes mellitus with hyperglycemia: Secondary | ICD-10-CM

## 2016-02-17 DIAGNOSIS — Z794 Long term (current) use of insulin: Secondary | ICD-10-CM

## 2016-02-17 DIAGNOSIS — I10 Essential (primary) hypertension: Secondary | ICD-10-CM

## 2016-02-17 DIAGNOSIS — E538 Deficiency of other specified B group vitamins: Secondary | ICD-10-CM | POA: Diagnosis not present

## 2016-02-17 DIAGNOSIS — E7800 Pure hypercholesterolemia, unspecified: Secondary | ICD-10-CM

## 2016-02-17 LAB — POCT GLYCOSYLATED HEMOGLOBIN (HGB A1C): Hemoglobin A1C: 7.4

## 2016-02-17 NOTE — Patient Instructions (Signed)
Take 3 clicks at lunch if planning to be active after

## 2016-02-17 NOTE — Progress Notes (Signed)
Patient ID: Jonathan Hensley, male   DOB: [birth year], 75 y.o.   MRN: [id number]   Reason for Appointment: Diabetes follow-up   History of Present Illness    Type 2 diabetes mellitus, date of diagnosis: 1994.   He has been on insulin for the last few years, starting with only bedtime NPH and subsequently progressing to basal bolus regimen Also taking glipizide and metformin long-term  The HbgA1c was highest in 2013 at 8.2 Because of more fluctuation in his blood sugars at all times, difficulty with compliance and inconvenience of the multiple insulin dose regimen as well as needing higher doses of insulin he was started on the V.-go pump in 05/2013  Recent history:   The insulin regimen is: basal rate of 20 units on V- go pump; boluses with carbohydrate coverage 1:10; usually 4-6 units 2-3 times a day   His A1c has been relatively stable just over 7%  Current blood sugar patterns and problems identified:  Usually has fluctuating blood sugar levels at all times including fasting  His A1c generally fairly stable but he claims that these are fairly good without hypoglycemia  He is checking his blood sugars about 4 times a day on an average at various times of the day  He now says that when he is having back pain he will have higher blood sugars, this is frequently in the afternoon after lunch  Also at times may have forgotten boluses with meals  Sometimes if he is eating 30 g of carbohydrates he will only bolus 2 units because of limitations with his pump  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 He knows how to count carbohydrates and is using 1:10 coverage  Difficulties with medication adherence: Bolusing for meals after eating at times, not covering snacks or desserts with boluses   Glucose monitoring: Accu-Chek, checking 3-4 times a day  Blood Glucose readings:   Mean values apply above for all meters except median for One  Touch  PRE-MEAL Fasting Lunch Dinner Bedtime Overall  Glucose range: 96-234 68-226 75-323 61-232   Mean/median: 165   140 169   POST-MEAL PC Breakfast PC Lunch PC Dinner  Glucose range:  101-235   Mean/median:  174       Wt Readings from Last 3 Encounters:  02/17/16 226 lb (102.5 kg)  10/18/15 232 lb 12.8 oz (105.6 kg)  06/16/15 232 lb 3.2 oz (105.3 kg)   Meals: 2 meals per day and sometimes no lunch.    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 Always use your most recent med list.          benazepril 40 MG tablet Commonly known as:  LOTENSIN Take 40 mg by mouth at bedtime.   DUREZOL 0.05 %  Emul Generic drug:  Difluprednate Reported on 10/18/2015   ELIQUIS 5 MG Tabs tablet Generic drug:  apixaban Take 5 mg by mouth 2 (two) times daily.   ezetimibe 10 MG tablet Commonly known as:  ZETIA Take 10 mg by mouth at bedtime.   fluticasone 0.05 % cream Commonly known as:  CUTIVATE Apply topically 2 (two) times daily.   freestyle lancets Use as instructed to check blood sugar 4 times per day dx code 250.00   FREESTYLE LITE test strip Generic drug:  glucose blood USE TO CHECK BLOOD SUGAR FOUR TIMES A DAY AS INSTRUCTED   ketoconazole 2 % cream Commonly known as:  NIZORAL Apply 1 application topically 2 (two) times daily. Use as directed   metFORMIN 1000 MG tablet Commonly known as:  GLUCOPHAGE TAKE 1 TABLET TWICE A  DAY WITH MEALS   NOVOLOG 100 UNIT/ML injection Generic drug:  insulin aspart USE UP TO 56 UNITS IN V-GO PUMP DAILY AS DIRECTED   simvastatin 20 MG tablet Commonly known as:  ZOCOR Take 20 mg by mouth at bedtime.   V-GO 20 Kit USE ONE PER DAY   VITAMIN B 12 PO Take 1 tablet by mouth daily. Reported on 10/18/2015       Allergies:  Allergies  Allergen Reactions  . Other Other (See Comments)    Beta or alpha blockers: severe dizziness  . Tramadol Other (See Comments)    Dizziness     Past Medical History:  Diagnosis Date  . Arthritis    rheumatoid in his hands  . Bulging lumbar disc   . Cancer ([place])   . DDD (degenerative disc disease), cervical   . Diabetes mellitus   . Diabetic retinopathy ([place])   . Double vision   . Dysrhythmia    Atrial Fibrillation  . History of prostate cancer   . Hypertension   . Low back pain   . Nephrolithiasis   . Sleep apnea    uses cpap    Past Surgical History:  Procedure Laterality Date  . APPENDECTOMY    . BACK SURGERY    . Bilateral L4-5 lumbar decompression including bilateral  11/2008  . brachytherapy    . COLONOSCOPY    . CYSTOSCOPY    . Left L4-L5 lumbar laminotomy and microdiskectomy with  07/14/2008    . LITHOTRIPSY    . LUMBAR LAMINECTOMY/DECOMPRESSION MICRODISCECTOMY Left 08/18/2013   Procedure: Left Lumbar Five-Sacral One Microdiskectomy;  Surgeon: [name], MD;  Location: [facility];  Service: Neurosurgery;  Laterality: Left;  Left Lumbar Five-Sacral One Microdiskectomy  . rotator cuff surgery    . Scleral buckle, laser photocoagulation, and anterior chamber  01/04/2009    Family History  Problem Relation Age of Onset  . Heart failure Father   . Heart failure Mother   . Heart failure Brother     Half brother.    Social History:  reports that he quit smoking about 43 years ago. He has never used smokeless tobacco. He reports that he does not drink alcohol or use drugs.  Review of  Systems   Hypertension:  Blood pressure is being treated  with benazepril alone, also followed by cardiologist Periodically checking at home and usually numbers are good at home  He is taking sodium bicarbonate for his history of kidney stones from urologist  HYPERLIPIDEMIA: Has been on simvastatin and Zetia with good results , is consistent with this  Lab Results  Component Value Date   CHOL 147 10/19/2015  HDL 53.40 10/19/2015   LDLCALC 73 10/19/2015   TRIG 99.0 10/19/2015   CHOLHDL 3 10/19/2015     He has had retinal detachment treated surgically in the past, followed by ophthalmologist, Has decreased vision in the left eye  He is having some wavering of his vision at times and is asking about which Dr. To contact for this He thinks he is already seeing Dr. Hassell Done at [facility]  Sleep apnea, is on CPAP and has been monitored by neurologist   Anemia :mild and stable Hemoccult  negative He has not had a colonoscopy in 15 years and absolutely refuses to do this  Last   Iron saturation was normal . He is taking OTC B-12 with adequate level   Lab Results  Component Value Date   WBC 7.0 10/19/2015   HGB 12.2 (L) 10/19/2015   HCT 37.0 (L) 10/19/2015   MCV 92.6 10/19/2015   PLT 245.0 10/19/2015   Diabetic foot exam done in 4/16   Examination:   BP 138/84   Pulse (!) 111   Temp 97.7 F (36.5 C)   Resp 16   Ht 6' (1.829 m)   Wt 226 lb (102.5 kg)   SpO2 96%   BMI 30.65 kg/m   Body mass index is 30.65 kg/m.    Assesment:   1. Diabetes type 2, uncontrolled  See history of present illness for detailed discussion of current blood sugar patterns, problems identified and day-to-day management  He is still very comfortable using the V-go pump Not clear why his fluctuation of blood sugars is present in the mornings unless he is tending to have snacks at night Does have readings near normal also in the mornings at times and will need to continue the 20 unit basal  for now POST prandial readings are recently better after breakfast and supper but tends to be higher in the afternoon He thinks some of his high readings are when he is having more back pain Has difficulty estimating how much bolus he will give for snacks Also May take less boluses if his carbohydrate intake is not managing the ability to take odd number of units on his pump  However A1c is reasonably good at 7.4 Discussed that he can take at least 2 units more during the day when 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 Hypertension: Well controlled and will continue Lotensin same doses, blood pressure may be relatively higher in the office  3.  Mild anemia: has been stable and will check again on the next visit Continue B12  4.  He can contact a ophthalmologist to see if he needs to see a neurologist for his possible nystagmus  5.  He wants to take the Zostavax but apparently has relatively high out-of-pocket expense  Counseling time on subjects discussed above is over 50% of today's 25 minute visit   Shyne Resch 02/17/2016, 10:57 AM

## 2016-03-01 ENCOUNTER — Encounter: Payer: Self-pay | Admitting: Endocrinology

## 2016-03-01 DIAGNOSIS — E113312 Type 2 diabetes mellitus with moderate nonproliferative diabetic retinopathy with macular edema, left eye: Secondary | ICD-10-CM | POA: Diagnosis not present

## 2016-03-01 LAB — HM DIABETES EYE EXAM

## 2016-03-04 ENCOUNTER — Other Ambulatory Visit: Payer: Self-pay | Admitting: Endocrinology

## 2016-03-30 ENCOUNTER — Telehealth: Payer: Self-pay | Admitting: Endocrinology

## 2016-03-30 NOTE — Telephone Encounter (Signed)
Pt came by and he was asking if we could send him a referral to an ENT, he said that his ears are really bothering, one is constantly filling up with wax.

## 2016-03-31 ENCOUNTER — Other Ambulatory Visit: Payer: Self-pay | Admitting: Endocrinology

## 2016-03-31 DIAGNOSIS — H6120 Impacted cerumen, unspecified ear: Secondary | ICD-10-CM

## 2016-03-31 NOTE — Telephone Encounter (Signed)
Sent referral 

## 2016-04-14 DIAGNOSIS — R42 Dizziness and giddiness: Secondary | ICD-10-CM | POA: Diagnosis not present

## 2016-04-14 DIAGNOSIS — H9012 Conductive hearing loss, unilateral, left ear, with unrestricted hearing on the contralateral side: Secondary | ICD-10-CM | POA: Diagnosis not present

## 2016-04-14 DIAGNOSIS — H6122 Impacted cerumen, left ear: Secondary | ICD-10-CM | POA: Diagnosis not present

## 2016-05-05 DIAGNOSIS — J0140 Acute pansinusitis, unspecified: Secondary | ICD-10-CM | POA: Diagnosis not present

## 2016-05-22 ENCOUNTER — Ambulatory Visit (INDEPENDENT_AMBULATORY_CARE_PROVIDER_SITE_OTHER): Payer: Medicare Other | Admitting: Endocrinology

## 2016-05-22 ENCOUNTER — Encounter: Payer: Self-pay | Admitting: Endocrinology

## 2016-05-22 VITALS — BP 124/76 | HR 75 | Ht 72.0 in | Wt 221.0 lb

## 2016-05-22 DIAGNOSIS — L989 Disorder of the skin and subcutaneous tissue, unspecified: Secondary | ICD-10-CM

## 2016-05-22 DIAGNOSIS — E538 Deficiency of other specified B group vitamins: Secondary | ICD-10-CM

## 2016-05-22 DIAGNOSIS — E1165 Type 2 diabetes mellitus with hyperglycemia: Secondary | ICD-10-CM | POA: Diagnosis not present

## 2016-05-22 DIAGNOSIS — E7800 Pure hypercholesterolemia, unspecified: Secondary | ICD-10-CM | POA: Diagnosis not present

## 2016-05-22 DIAGNOSIS — Z794 Long term (current) use of insulin: Secondary | ICD-10-CM | POA: Diagnosis not present

## 2016-05-22 DIAGNOSIS — D638 Anemia in other chronic diseases classified elsewhere: Secondary | ICD-10-CM | POA: Diagnosis not present

## 2016-05-22 DIAGNOSIS — R413 Other amnesia: Secondary | ICD-10-CM | POA: Diagnosis not present

## 2016-05-22 LAB — COMPREHENSIVE METABOLIC PANEL
ALBUMIN: 4.1 g/dL (ref 3.5–5.2)
ALK PHOS: 40 U/L (ref 39–117)
ALT: 11 U/L (ref 0–53)
AST: 14 U/L (ref 0–37)
BILIRUBIN TOTAL: 0.4 mg/dL (ref 0.2–1.2)
BUN: 26 mg/dL — ABNORMAL HIGH (ref 6–23)
CALCIUM: 9.9 mg/dL (ref 8.4–10.5)
CO2: 26 mEq/L (ref 19–32)
Chloride: 103 mEq/L (ref 96–112)
Creatinine, Ser: 1.32 mg/dL (ref 0.40–1.50)
GFR: 56.11 mL/min — AB (ref >60.00)
GLUCOSE: 153 mg/dL — AB (ref 70–99)
POTASSIUM: 4.7 meq/L (ref 3.5–5.1)
Sodium: 138 mEq/L (ref 135–145)
TOTAL PROTEIN: 7.4 g/dL (ref 6.0–8.3)

## 2016-05-22 LAB — LIPID PANEL
CHOLESTEROL: 135 mg/dL (ref 0–200)
HDL: 47.3 mg/dL (ref >39.00)
LDL Cholesterol: 64 mg/dL (ref 0–99)
NONHDL: 87.28
TRIGLYCERIDES: 118 mg/dL (ref 0.0–149.0)
Total CHOL/HDL Ratio: 3
VLDL: 23.6 mg/dL (ref 0.0–40.0)

## 2016-05-22 LAB — IBC PANEL
IRON: 81 ug/dL (ref 42–165)
Saturation Ratios: 26.5 % (ref 20.0–50.0)
TRANSFERRIN: 218 mg/dL (ref 212.0–360.0)

## 2016-05-22 LAB — CBC
HCT: 38.6 % — ABNORMAL LOW (ref 39.0–52.0)
Hemoglobin: 12.9 g/dL — ABNORMAL LOW (ref 13.0–17.0)
MCHC: 33.6 g/dL (ref 30.0–36.0)
MCV: 91.6 fl (ref 78.0–100.0)
Platelets: 304 10*3/uL (ref 150.0–400.0)
RBC: 4.21 Mil/uL — AB (ref 4.22–5.81)
RDW: 13.1 % (ref 11.5–15.5)
WBC: 7.4 10*3/uL (ref 4.0–10.5)

## 2016-05-22 LAB — POCT GLYCOSYLATED HEMOGLOBIN (HGB A1C): HEMOGLOBIN A1C: 7.7

## 2016-05-22 LAB — VITAMIN B12: VITAMIN B 12: 424 pg/mL (ref 211–911)

## 2016-05-22 LAB — TSH: TSH: 2.36 u[IU]/mL (ref 0.35–4.50)

## 2016-05-22 LAB — T4, FREE: FREE T4: 0.95 ng/dL (ref 0.60–1.60)

## 2016-05-22 NOTE — Progress Notes (Signed)
Patient ID: Jonathan Hensley, male   DOB: [birth year], 76 y.o.   MRN: [id number]   Reason for Appointment: Diabetes follow-up   History of Present Illness    Type 2 diabetes mellitus, date of diagnosis: 1994.   He has been on insulin for the last few years, starting with only bedtime NPH and subsequently progressing to basal bolus regimen Also taking glipizide and metformin long-term  The HbgA1c was highest in 2013 at 8.2 Because of more fluctuation in his blood sugars at all times, difficulty with compliance and inconvenience of the multiple insulin dose regimen as well as needing higher doses of insulin he was started on the V.-go pump in 05/2013  Recent history:   The insulin regimen is: basal rate of 20 units on V- go pump; boluses with carbohydrate coverage 1:10; usually 4-6 units 2-3 times a day   His A1c has been consistently over 7% and now 7.7  Current blood sugar patterns and problems identified:  He has not checked his blood sugar is much  He has checked blood sugars mostly at breakfast and suppertime recently  Highest blood sugars are usually in the evenings before or after supper but these are better in the last few days  He thinks blood sugars may have been higher when he had a respiratory infection late December but still has sporadic readings over 200 in the evenings  He may be forgetting to bolus sometimes before eating  Also may not bolus consistently at lunchtime especially if he is eating a small meal or snack  No significant hypoglycemia, lowest blood sugar only once at 6 PM and was 62 . He knows how to count carbohydrates and is using 1:10 coverage  Difficulties with medication adherence: Bolusing for meals after eating at times, not covering snacks or desserts with boluses   Glucose monitoring: Accu-Chek, checking 3-4 times a day  Blood Glucose readings:   Mean values apply above for all meters except median for One Touch  PRE-MEAL Fasting  Midday  Dinner Bedtime Overall  Glucose range: 80-188  138, 231  62-359  93-270    Mean/median: 126   177  160  157     Wt Readings from Last 3 Encounters:  05/22/16 221 lb (100.2 kg)  02/17/16 226 lb (102.5 kg)  10/18/15 232 lb 12.8 oz (105.6 kg)   Meals: 2 meals per day and sometimes no lunch.    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 Always use your most recent med list.  benazepril 40 MG tablet Commonly known as:  LOTENSIN Take 40 mg by mouth at bedtime.   DUREZOL 0.05 % Emul Generic drug:  Difluprednate Reported on 10/18/2015   ELIQUIS 5 MG Tabs tablet Generic drug:  apixaban Take 5 mg by mouth 2 (two) times daily.   ezetimibe 10 MG tablet Commonly known as:  ZETIA Take 10 mg by mouth at bedtime.   fluticasone 0.05 % cream Commonly known as:  CUTIVATE Apply topically 2 (two) times daily.   freestyle lancets Use as instructed to check blood sugar 4 times per day dx code 250.00   FREESTYLE LITE test strip Generic drug:  glucose blood USE TO CHECK BLOOD SUGAR FOUR TIMES A DAY AS INSTRUCTED   ketoconazole 2 % cream Commonly known as:  NIZORAL Apply 1 application topically 2 (two) times daily. Use  as directed   metFORMIN 1000 MG tablet Commonly known as:  GLUCOPHAGE TAKE 1 TABLET TWICE A DAY WITH MEALS   NOVOLOG 100 UNIT/ML injection Generic drug:  insulin aspart USE UP TO 56 UNITS IN V-GO PUMP DAILY AS DIRECTED   simvastatin 20 MG tablet Commonly known as:  ZOCOR Take 20 mg by mouth at bedtime.   V-GO 20 Kit USE ONE PER DAY   VITAMIN B 12 PO Take 1 tablet by mouth daily. Reported on 10/18/2015       Allergies:  Allergies  Allergen Reactions  . Other Other (See Comments)    Beta or alpha blockers: severe dizziness  . Tramadol Other (See Comments)    Dizziness     Past Medical History:  Diagnosis Date  . Arthritis    rheumatoid in his hands  . Bulging lumbar disc   . Cancer ([place])   . DDD (degenerative disc disease), cervical   . Diabetes mellitus   . Diabetic retinopathy ([place])   . Double vision   . Dysrhythmia    Atrial Fibrillation  . History of prostate cancer   . Hypertension   . Low back pain   . Nephrolithiasis   . Sleep apnea    uses cpap    Past Surgical History:  Procedure Laterality Date  . APPENDECTOMY    . BACK SURGERY    . Bilateral L4-5 lumbar decompression including bilateral  11/2008  . brachytherapy    . COLONOSCOPY    . CYSTOSCOPY    . Left L4-L5 lumbar laminotomy and microdiskectomy with  07/14/2008    . LITHOTRIPSY    . LUMBAR LAMINECTOMY/DECOMPRESSION MICRODISCECTOMY Left 08/18/2013   Procedure: Left Lumbar Five-Sacral One Microdiskectomy;  Surgeon: [name], MD;  Location: [facility];  Service: Neurosurgery;  Laterality: Left;  Left Lumbar Five-Sacral One Microdiskectomy  . rotator cuff surgery    . Scleral buckle, laser photocoagulation, and anterior chamber  01/04/2009    Family History  Problem Relation Age of Onset  . Heart failure Father   . Heart failure Mother   . Heart failure Brother     Half brother.    Social History:  reports that he quit smoking about 43 years ago. He has never used smokeless  tobacco. He reports that he does not drink alcohol or use drugs.  Review of Systems  Today he is complaining about difficulty with word finding and continuing his sentence.  However he is sometimes feels like when he is talking he forgets what he is talking about. Does not think that he forgets recent events from the day before He says when going on  for about 6 months His last MRI about 2 years ago showed mild ischemic changes He does not want to go back to the same neurologist   Hypertension:  Blood pressure is being treated  with benazepril 40 mg, also followed by cardiologist Periodically checking at home , less recently   He is taking sodium bicarbonate for his history of kidney stones from urologist  HYPERLIPIDEMIA: Has been on simvastatin and Zetia with good results    Lab Results  Component Value Date   CHOL 147 10/19/2015   HDL 53.40 10/19/2015   LDLCALC 73 10/19/2015   TRIG 99.0 10/19/2015   CHOLHDL 3 10/19/2015     He has had retinal detachment treated surgically in the past, followed by ophthalmologist, Has decreased vision in the left eye  Followed very regularly now  Sleep apnea, is on CPAP   Anemia :mild and stable Hemoccult  Negative previously He has not had a colonoscopy in 15 years and absolutely refuses to do this  Last   Iron saturation was normal . He is taking OTC B-12    Lab Results  Component Value Date   WBC 7.0 10/19/2015   HGB 12.2 (L) 10/19/2015   HCT 37.0 (L) 10/19/2015   MCV 92.6 10/19/2015   PLT 245.0 10/19/2015   Diabetic foot exam done in 6/17    Examination:   BP 124/76   Pulse 75   Ht 6' (1.829 m)   Wt 221 lb (100.2 kg)   SpO2 98%   BMI 29.97 kg/m   Body mass index is 29.97 kg/m.     Has slightly pigmented scaly lesion on right forehead about 1-1.5 cm, not raised Also another smaller pigmented area on the left side No ankle edema  Assesment:   1. Diabetes type 2, uncontrolled  See history of present illness for  detailed discussion of current blood sugar patterns, problems identified and day-to-day management  A1c is higher at 7.7 He has not checked his blood sugars much and maybe had high readings from recent respiratory illness However is losing weight, had lost appetite with this illness  Since he is not checking his sugars after meals difficult to know if he is getting enough boluses Also may not be compliant consistently with boluses at meals especially lunch and supper Considering his age and duration of diabetes his levels of control may be still adequate Encouraged him to check more and take extra insulin for high readings  2. Hypertension: Well controlled and will continue Lotensin 40 mg  3.  Mild anemia: has been stable and will check labs today  4. ?  MEMORY deficit versus word finding difficulties: Will have him evaluated by neurologist.  May well be related to vascular causes, will also check TSH to rule out hypothyroidism, this was normal about a year ago B-12 to be checked today  5.  Pigmented areas on forehead: These look benign and will refer him to dermatologist  Total visit time for evaluation and management of multiple problems, coordination of care and review of labs = 25 minutes   Sheldon Amara 05/22/2016, 2:07 PM

## 2016-05-23 NOTE — Progress Notes (Signed)
Please let patient know that the lab results are normal and no further action needed, have requested consultations with specialists as discussed

## 2016-05-24 DIAGNOSIS — E113392 Type 2 diabetes mellitus with moderate nonproliferative diabetic retinopathy without macular edema, left eye: Secondary | ICD-10-CM | POA: Diagnosis not present

## 2016-05-24 LAB — HM DIABETES EYE EXAM

## 2016-05-26 ENCOUNTER — Other Ambulatory Visit: Payer: Self-pay | Admitting: Endocrinology

## 2016-06-09 ENCOUNTER — Telehealth: Payer: Self-pay | Admitting: Endocrinology

## 2016-06-09 NOTE — Telephone Encounter (Signed)
Need a new prescription ketoconazole (NIZORAL) 2 % cream fluticasone (CUTIVATE) 0.05 % cream  [facility] [place], [place] [phone] (Phone) [phone] (Fax)     Please call patient

## 2016-06-09 NOTE — Telephone Encounter (Signed)
Okay but please ask him what he is using these for

## 2016-06-12 NOTE — Telephone Encounter (Signed)
Patient using the creams for genital area- patient stated he has to use it or he can not hardly walk

## 2016-06-13 ENCOUNTER — Observation Stay (HOSPITAL_COMMUNITY)
Admission: EM | Admit: 2016-06-13 | Discharge: 2016-06-14 | Disposition: A | Discharge status: Home / Self Care | Payer: Medicare Other | Attending: Family Medicine | Admitting: Family Medicine

## 2016-06-13 ENCOUNTER — Emergency Department (HOSPITAL_COMMUNITY): Payer: Medicare Other

## 2016-06-13 ENCOUNTER — Ambulatory Visit: Payer: Medicare Other | Admitting: Urology

## 2016-06-13 ENCOUNTER — Encounter (HOSPITAL_COMMUNITY): Payer: Self-pay

## 2016-06-13 DIAGNOSIS — Z8546 Personal history of malignant neoplasm of prostate: Secondary | ICD-10-CM

## 2016-06-13 DIAGNOSIS — E11319 Type 2 diabetes mellitus with unspecified diabetic retinopathy without macular edema: Secondary | ICD-10-CM | POA: Diagnosis not present

## 2016-06-13 DIAGNOSIS — Z79899 Other long term (current) drug therapy: Secondary | ICD-10-CM | POA: Diagnosis not present

## 2016-06-13 DIAGNOSIS — Z87891 Personal history of nicotine dependence: Secondary | ICD-10-CM | POA: Insufficient documentation

## 2016-06-13 DIAGNOSIS — Z7901 Long term (current) use of anticoagulants: Secondary | ICD-10-CM | POA: Insufficient documentation

## 2016-06-13 DIAGNOSIS — I700 Atherosclerosis of aorta: Secondary | ICD-10-CM | POA: Insufficient documentation

## 2016-06-13 DIAGNOSIS — K625 Hemorrhage of anus and rectum: Secondary | ICD-10-CM | POA: Insufficient documentation

## 2016-06-13 DIAGNOSIS — R000 Tachycardia, unspecified: Secondary | ICD-10-CM | POA: Diagnosis not present

## 2016-06-13 DIAGNOSIS — I4891 Unspecified atrial fibrillation: Secondary | ICD-10-CM | POA: Diagnosis not present

## 2016-06-13 DIAGNOSIS — M06849 Other specified rheumatoid arthritis, unspecified hand: Secondary | ICD-10-CM | POA: Insufficient documentation

## 2016-06-13 DIAGNOSIS — Z794 Long term (current) use of insulin: Secondary | ICD-10-CM | POA: Insufficient documentation

## 2016-06-13 DIAGNOSIS — K922 Gastrointestinal hemorrhage, unspecified: Secondary | ICD-10-CM | POA: Diagnosis not present

## 2016-06-13 DIAGNOSIS — K6289 Other specified diseases of anus and rectum: Secondary | ICD-10-CM

## 2016-06-13 DIAGNOSIS — I10 Essential (primary) hypertension: Secondary | ICD-10-CM | POA: Diagnosis not present

## 2016-06-13 DIAGNOSIS — Z87442 Personal history of urinary calculi: Secondary | ICD-10-CM | POA: Diagnosis not present

## 2016-06-13 DIAGNOSIS — E1122 Type 2 diabetes mellitus with diabetic chronic kidney disease: Secondary | ICD-10-CM | POA: Insufficient documentation

## 2016-06-13 DIAGNOSIS — N189 Chronic kidney disease, unspecified: Secondary | ICD-10-CM | POA: Diagnosis not present

## 2016-06-13 DIAGNOSIS — G4730 Sleep apnea, unspecified: Secondary | ICD-10-CM | POA: Insufficient documentation

## 2016-06-13 DIAGNOSIS — I129 Hypertensive chronic kidney disease with stage 1 through stage 4 chronic kidney disease, or unspecified chronic kidney disease: Secondary | ICD-10-CM | POA: Diagnosis not present

## 2016-06-13 DIAGNOSIS — E119 Type 2 diabetes mellitus without complications: Secondary | ICD-10-CM

## 2016-06-13 DIAGNOSIS — N419 Inflammatory disease of prostate, unspecified: Secondary | ICD-10-CM | POA: Diagnosis not present

## 2016-06-13 HISTORY — DX: Unspecified atrial fibrillation: I48.91

## 2016-06-13 LAB — TROPONIN I: Troponin I: <0.03 ng/mL (ref <0.03)

## 2016-06-13 LAB — CBC
HEMATOCRIT: 38.8 % — AB (ref 39.0–52.0)
Hemoglobin: 13.2 g/dL (ref 13.0–17.0)
MCH: 31.1 pg (ref 26.0–34.0)
MCHC: 34 g/dL (ref 30.0–36.0)
MCV: 91.3 fL (ref 78.0–100.0)
PLATELETS: 273 10*3/uL (ref 150–400)
RBC: 4.25 MIL/uL (ref 4.22–5.81)
RDW: 12.7 % (ref 11.5–15.5)
WBC: 7.2 10*3/uL (ref 4.0–10.5)

## 2016-06-13 LAB — COMPREHENSIVE METABOLIC PANEL
ALBUMIN: 4.1 g/dL (ref 3.5–5.0)
ALT: 18 U/L (ref 17–63)
AST: 24 U/L (ref 15–41)
Alkaline Phosphatase: 42 U/L (ref 38–126)
Anion gap: 10 (ref 5–15)
BUN: 29 mg/dL — AB (ref 6–20)
CHLORIDE: 103 mmol/L (ref 101–111)
CO2: 22 mmol/L (ref 22–32)
CREATININE: 1.43 mg/dL — AB (ref 0.61–1.24)
Calcium: 9.5 mg/dL (ref 8.9–10.3)
GFR calc Af Amer: 54 mL/min — ABNORMAL LOW (ref >60)
GFR, EST NON AFRICAN AMERICAN: 46 mL/min — AB (ref >60)
GLUCOSE: 315 mg/dL — AB (ref 65–99)
Potassium: 4 mmol/L (ref 3.5–5.1)
Sodium: 135 mmol/L (ref 135–145)
Total Bilirubin: 0.5 mg/dL (ref 0.3–1.2)
Total Protein: 7.4 g/dL (ref 6.5–8.1)

## 2016-06-13 LAB — GLUCOSE, CAPILLARY: GLUCOSE-CAPILLARY: 237 mg/dL — AB (ref 65–99)

## 2016-06-13 LAB — TYPE AND SCREEN
ABO/RH(D): O POS
Antibody Screen: NEGATIVE

## 2016-06-13 LAB — CBG MONITORING, ED: GLUCOSE-CAPILLARY: 167 mg/dL — AB (ref 65–99)

## 2016-06-13 LAB — POC OCCULT BLOOD, ED: Fecal Occult Bld: POSITIVE — AB

## 2016-06-13 MED ORDER — DILTIAZEM HCL ER COATED BEADS 120 MG PO CP24
120.0000 mg | ORAL_CAPSULE | Freq: Every day | ORAL | Status: DC
  Administered 2016-06-13: 120 mg via ORAL
  Filled 2016-06-13 (×2): qty 1

## 2016-06-13 MED ORDER — SODIUM CHLORIDE 0.9% FLUSH: 3.0000 mL | INTRAVENOUS | Status: DC | PRN

## 2016-06-13 MED ORDER — CIPROFLOXACIN HCL 250 MG PO TABS
500.0000 mg | ORAL_TABLET | Freq: Two times a day (BID) | ORAL | Status: DC
  Administered 2016-06-14: 500 mg via ORAL
  Filled 2016-06-13: qty 2

## 2016-06-13 MED ORDER — SODIUM CHLORIDE 0.9 % IV SOLN: 250.0000 mL | INTRAVENOUS | Status: DC | PRN

## 2016-06-13 MED ORDER — IOPAMIDOL (ISOVUE-300) INJECTION 61%
100.0000 mL | Freq: Once | INTRAVENOUS | Status: AC | PRN
  Administered 2016-06-13: 100 mL via INTRAVENOUS

## 2016-06-13 MED ORDER — IOPAMIDOL (ISOVUE-300) INJECTION 61%
INTRAVENOUS | Status: AC
  Filled 2016-06-13: qty 30

## 2016-06-13 MED ORDER — DILTIAZEM HCL-DEXTROSE 100-5 MG/100ML-% IV SOLN (PREMIX): 5.0000 mg/h | INTRAVENOUS | Status: DC

## 2016-06-13 MED ORDER — APIXABAN 5 MG PO TABS
5.0000 mg | ORAL_TABLET | Freq: Two times a day (BID) | ORAL | Status: DC
  Administered 2016-06-14: 5 mg via ORAL
  Filled 2016-06-13: qty 1

## 2016-06-13 MED ORDER — DILTIAZEM LOAD VIA INFUSION: 10.0000 mg | Freq: Once | INTRAVENOUS | Status: DC

## 2016-06-13 MED ORDER — BENAZEPRIL HCL 10 MG PO TABS
40.0000 mg | ORAL_TABLET | Freq: Every day | ORAL | Status: DC
  Administered 2016-06-14: 40 mg via ORAL
  Filled 2016-06-13: qty 4

## 2016-06-13 MED ORDER — DILTIAZEM LOAD VIA INFUSION
10.0000 mg | Freq: Once | INTRAVENOUS | Status: AC
  Administered 2016-06-13: 10 mg via INTRAVENOUS
  Filled 2016-06-13: qty 10

## 2016-06-13 MED ORDER — DILTIAZEM HCL-DEXTROSE 100-5 MG/100ML-% IV SOLN (PREMIX)
5.0000 mg/h | INTRAVENOUS | Status: DC
  Administered 2016-06-13: 5 mg/h via INTRAVENOUS
  Filled 2016-06-13: qty 100

## 2016-06-13 MED ORDER — CIPROFLOXACIN HCL 500 MG PO TABS: 500.0000 mg | ORAL_TABLET | Freq: Two times a day (BID) | ORAL | 0 refills | Status: DC

## 2016-06-13 MED ORDER — SODIUM CHLORIDE 0.9% FLUSH: 3.0000 mL | Freq: Two times a day (BID) | INTRAVENOUS | Status: DC

## 2016-06-13 MED ORDER — SIMVASTATIN 20 MG PO TABS
20.0000 mg | ORAL_TABLET | Freq: Every day | ORAL | Status: DC
  Administered 2016-06-14: 20 mg via ORAL
  Filled 2016-06-13: qty 1

## 2016-06-13 MED ORDER — METRONIDAZOLE 500 MG PO TABS
500.0000 mg | ORAL_TABLET | Freq: Once | ORAL | Status: AC
  Administered 2016-06-13: 500 mg via ORAL
  Filled 2016-06-13: qty 1

## 2016-06-13 MED ORDER — INSULIN ASPART 100 UNIT/ML ~~LOC~~ SOLN
0.0000 [IU] | Freq: Three times a day (TID) | SUBCUTANEOUS | Status: DC
Start: 2016-06-14 — End: 2016-06-14
  Administered 2016-06-14: 3 [IU] via SUBCUTANEOUS

## 2016-06-13 MED ORDER — CIPROFLOXACIN HCL 250 MG PO TABS
500.0000 mg | ORAL_TABLET | Freq: Once | ORAL | Status: AC
  Administered 2016-06-13: 500 mg via ORAL
  Filled 2016-06-13: qty 2

## 2016-06-13 MED ORDER — METRONIDAZOLE 500 MG PO TABS: 500.0000 mg | ORAL_TABLET | Freq: Three times a day (TID) | ORAL | 0 refills | Status: DC

## 2016-06-13 MED ORDER — DILTIAZEM HCL ER COATED BEADS 120 MG PO CP24: 120.0000 mg | ORAL_CAPSULE | Freq: Every day | ORAL | 0 refills | Status: DC

## 2016-06-13 MED ORDER — DILTIAZEM HCL-DEXTROSE 100-5 MG/100ML-% IV SOLN (PREMIX)
5.0000 mg/h | INTRAVENOUS | Status: DC
  Administered 2016-06-13: 5 mg/h via INTRAVENOUS

## 2016-06-13 NOTE — ED Notes (Signed)
EKG shown to Dr. Thurnell Garbe.

## 2016-06-13 NOTE — ED Notes (Signed)
Pt taken to CT by ethan [place].

## 2016-06-13 NOTE — ED Triage Notes (Signed)
Pt says 6 years ago he had seeds implanted for  treatment of a prostate problem.  Pt says since then he has had dryness and some bleeding around rectum.  Reports the bleeding has increased over the past couple of days.  Pt c/o burning in rectum.  Pt says he feels "great" denies any weakness, dizziness, lightheadedness, or SOB.

## 2016-06-13 NOTE — ED Notes (Signed)
Pt made aware to return if symptoms worsen or if any life threatening symptoms occur.   

## 2016-06-13 NOTE — H&P (Signed)
History and Physical    Jonathan Hensley PRF:[id number] DOB: Jul 25, 1940 DOA: 06/13/2016  PCP: [name], MD  Patient coming from:  home  Chief Complaint:   Rectal bleeding  HPI: Jonathan Hensley is a 76 y.o. male with medical history significant of  Prostate cancer, afib on eloquis, HTN comes in with complaints of mild rectal blood after a BM today.  He denies any fevers.  No chills.  No dysuria or hematuria.  Pt was diagnosed with proctatitis by dr Elise Benne in ED given cipro and flagyl orally and was being prepared to go home.  He was in afib with rvr in ED, given an oral load of diltiazem and bolus.  He converted to NSR then as he was getting up he went into afib with rvr again, at which time he was referred for admission.  Pt is on cardizem 42m/hour and is now back in NSR.  He is requesting to go home.  He says he was hungry and hasnt eaten all day in the ED and that's why his heart rate is high.  Review of Systems: As per HPI otherwise 10 point review of systems negative.   Past Medical History:  Diagnosis Date  . Arthritis    rheumatoid in his hands  . Atrial fibrillation ([place]   . Bulging lumbar disc   . Cancer [place]   . DDD (degenerative disc disease), cervical   . Diabetes mellitus   . Diabetic retinopathy ([place]   . Double vision   . Dysrhythmia    Atrial Fibrillation  . History of prostate cancer   . Hypertension   . Low back pain   . Nephrolithiasis   . Sleep apnea    uses cpap    Past Surgical History:  Procedure Laterality Date  . APPENDECTOMY    . BACK SURGERY    . Bilateral L4-5 lumbar decompression including bilateral  11/2008  . brachytherapy    . COLONOSCOPY    . CYSTOSCOPY    . Left L4-L5 lumbar laminotomy and microdiskectomy with  07/14/2008    . LITHOTRIPSY    . LUMBAR LAMINECTOMY/DECOMPRESSION MICRODISCECTOMY Left 08/18/2013   Procedure: Left Lumbar Five-Sacral One Microdiskectomy;  Surgeon: RHosie Spangle MD;  [facility];  Service:  Neurosurgery;  Laterality: Left;  Left Lumbar Five-Sacral One Microdiskectomy  . rotator cuff surgery    . Scleral buckle, laser photocoagulation, and anterior chamber  01/04/2009     reports that he quit smoking about 43 years ago. He has never used smokeless tobacco. He reports that he does not drink alcohol or use drugs.  Allergies  Allergen Reactions  . Other Other (See Comments)    Beta or alpha blockers: severe dizziness  . Tramadol Other (See Comments)    Dizziness     Family History  Problem Relation Age of Onset  . Heart failure Father   . Heart failure Mother   . Heart failure Brother     Half brother.    Prior to Admission medications   Medication Sig Start Date End Date Taking? Authorizing Provider  apixaban (ELIQUIS) 5 MG TABS tablet Take 5 mg by mouth 2 (two) times daily.   Yes Historical Provider, MD  benazepril (LOTENSIN) 40 MG tablet Take 40 mg by mouth at bedtime.    Yes Historical Provider, MD  ezetimibe (ZETIA) 10 MG tablet Take 10 mg by mouth at bedtime.    Yes Historical Provider, MD  fluticasone (CUTIVATE) 0.05 % cream Apply 1  application topically daily. 05/30/16  Yes Historical Provider, MD  FREESTYLE LITE test strip USE TO CHECK BLOOD SUGAR FOUR TIMES A DAY AS INSTRUCTED 01/24/16  Yes [name], MD  Insulin Disposable Pump (V-GO 20) KIT USE ONE PER DAY 11/04/15  Yes [name], MD  ketoconazole (NIZORAL) 2 % cream Apply 1 application topically daily. 05/30/16  Yes Historical Provider, MD  Lancets (FREESTYLE) lancets Use as instructed to check blood sugar 4 times per day dx code 250.00 12/10/13  Yes [name], MD  metFORMIN (GLUCOPHAGE) 1000 MG tablet TAKE 1 TABLET TWICE A DAY WITH MEALS 03/05/16  Yes [name], MD  NOVOLOG 100 UNIT/ML injection USE UP TO 56 UNITS IN V-GO PUMP DAILY AS DIRECTED 05/26/16  Yes [name], MD  simvastatin (ZOCOR) 20 MG tablet Take 20 mg by mouth at bedtime.    Yes Historical Provider, MD  ciprofloxacin (CIPRO) 500 MG tablet Take  1 tablet (500 mg total) by mouth 2 (two) times daily. 06/13/16   Francine Graven, DO  Cyanocobalamin (VITAMIN B 12 PO) Take 1 tablet by mouth daily. Reported on 10/18/2015    Historical Provider, MD  diltiazem (CARDIZEM CD) 120 MG 24 hr capsule Take 1 capsule (120 mg total) by mouth daily. 06/13/16   Francine Graven, DO  DUREZOL 0.05 % EMUL Reported on 10/18/2015 01/21/15   Historical Provider, MD  metroNIDAZOLE (FLAGYL) 500 MG tablet Take 1 tablet (500 mg total) by mouth 3 (three) times daily. 06/13/16   Francine Graven, DO    Physical Exam: Vitals:   06/13/16 1831 06/13/16 1900 06/13/16 1915 06/13/16 1922  BP: 116/82 (!) 130/116    Pulse: 108  (!) 121 68  Resp: 18 24 20 21   Temp: 98.3 F (36.8 C)     TempSrc: Oral     SpO2: 96%  96% 96%  Weight:      Height:        Constitutional: NAD, calm, comfortable Vitals:   06/13/16 1831 06/13/16 1900 06/13/16 1915 06/13/16 1922  BP: 116/82 (!) 130/116    Pulse: 108  (!) 121 68  Resp: 18 24 20 21   Temp: 98.3 F (36.8 C)     TempSrc: Oral     SpO2: 96%  96% 96%  Weight:      Height:       Eyes: PERRL, lids and conjunctivae normal ENMT: Mucous membranes are moist. Posterior pharynx clear of any exudate or lesions.Normal dentition.  Neck: normal, supple, no masses, no thyromegaly Respiratory: clear to auscultation bilaterally, no wheezing, no crackles. Normal respiratory effort. No accessory muscle use.  Cardiovascular: Regular rate and rhythm, no murmurs / rubs / gallops. No extremity edema. 2+ pedal pulses. No carotid bruits.  Abdomen: no tenderness, no masses palpated. No hepatosplenomegaly. Bowel sounds positive.  Musculoskeletal: no clubbing / cyanosis. No joint deformity upper and lower extremities. Good ROM, no contractures. Normal muscle tone.  Skin: no rashes, lesions, ulcers. No induration Neurologic: CN 2-12 grossly intact. Sensation intact, DTR normal. Strength 5/5 in all 4.  Psychiatric: Normal judgment and insight. Alert and  oriented x 3. Normal mood.    Labs on Admission: I have personally reviewed following labs and imaging studies  CBC:  Recent Labs Lab 06/13/16 1340  WBC 7.2  HGB 13.2  HCT 38.8*  MCV 91.3  PLT 195   Basic Metabolic Panel:  Recent Labs Lab 06/13/16 1340  NA 135  K 4.0  CL 103  CO2 22  GLUCOSE 315*  BUN  29*  CREATININE 1.43*  CALCIUM 9.5   GFR: Estimated Creatinine Clearance: 52.7 mL/min (by C-G formula based on SCr of 1.43 mg/dL (H)). Liver Function Tests:  Recent Labs Lab 06/13/16 1340  AST 24  ALT 18  ALKPHOS 42  BILITOT 0.5  PROT 7.4  ALBUMIN 4.1    Cardiac Enzymes:  Recent Labs Lab 06/13/16 1433  TROPONINI <0.03    CBG:  Recent Labs Lab 06/13/16 1859  GLUCAP 167*      radiological Exams on Admission: Ct Abdomen Pelvis W Contrast  Result Date: 06/13/2016 CLINICAL DATA:  Rectal bleeding, dryness occasional bleeding since diagnosed with prostate cancer 6 years ago and had brachytherapy seed placement, increased bleeding over past couple days, burning in rectum, history hypertension, diabetes mellitus, nephrolithiasis, atrial fibrillation EXAM: CT ABDOMEN AND PELVIS WITH CONTRAST TECHNIQUE: Multidetector CT imaging of the abdomen and pelvis was performed using the standard protocol following bolus administration of intravenous contrast. Sagittal and coronal MPR images reconstructed from axial data set. CONTRAST:  124m ISOVUE-300 IOPAMIDOL (ISOVUE-300) INJECTION 61% IV. Dilute oral contrast. COMPARISON:  12/19/2011 FINDINGS: Lower chest: Minimal RIGHT basilar atelectasis versus scarring. Hepatobiliary: 7 mm low-attenuation lesion RIGHT lobe liver image 39 previously 6 mm. 12 mm cyst far LEFT lateral aspect of the lateral segment LEFT lobe previously 9 mm diameter. Gallbladder and liver otherwise normal appearance. Pancreas: Normal appearance Spleen: Normal appearance Adrenals/Urinary Tract: Adrenal glands, kidneys, ureters, and bladder normal  appearance. Brachytherapy seed implants at prostate bed. Stomach/Bowel: Appendix surgically absent per ED HR. Questionable rectal wall thickening versus artifact from underdistention at the distal rectum, immediately posterior to the prostate gland. Stomach and bowel loops otherwise normal appearance. Vascular/Lymphatic: Atherosclerotic calcifications aorta and scattered abdominal/ pelvic vessels without aortic aneurysm. No adenopathy. Coronary artery calcifications also noted. Reproductive: N/A Other: No free air free fluid.  No inflammatory process or hernia. Musculoskeletal: Prior posterior fusion L4-L5.  Bones demineralized. IMPRESSION: Questionable rectal wall thickening anterior to the prostate gland versus artifact from underdistention; recommend correlation with proctoscopy to exclude inflammatory process or tumor. No other significant intra-abdominal or intrapelvic abnormalities. Aortic atherosclerosis and coronary arterial ossification. Electronically Signed   By: MLavonia DanaM.D.   On: 06/13/2016 16:48    EKG: Independently reviewed. afib w rvr  Assessment/Plan 76yo male comes in with likely prostatitis found to be in afib with rvr  Principal Problem:   Atrial fibrillation with rapid ventricular response ([place]- loaded with 1281mof oral cardizem in ED.  Cont drip and wean as HR tolerates.  In NSR now with rate in the 80s.  Active Problems:   DM type 2 (diabetes mellitus, type 2) [place]   History of prostate cancer- noted, cont outpatient follow up with urology   Prostatitis- cont cipro.  Needs outpatient follow up with urology    DVT prophylaxis: scds Code Status:  full Family Communication: none  Disposition Plan: per day team Consults called:  none Admission status:  observation   DAVID,RACHAL A MD [facility]  If 7PM-7AM, please contact night-coverage [email or web address] Password [email or web address]  06/13/2016, 8:03 PM

## 2016-06-13 NOTE — Progress Notes (Signed)
Paged Dr Alfonso Patten.David per admission request to advise pt had arrived to ICU in room ICU5

## 2016-06-13 NOTE — ED Notes (Signed)
CT aware. Pt finished drinking contrast.

## 2016-06-13 NOTE — ED Provider Notes (Signed)
[facility] DEPT Provider Note   CSN: [id number] Arrival date & time: 06/13/16  1316     History   Chief Complaint Chief Complaint  Patient presents with  . Rectal Bleeding    HPI Jonathan Hensley is a 76 y.o. male.  HPI  Pt was seen at 1345. Per pt, c/o gradual onset and persistence of multiple intermittent episodes of "rectal bleeding and dryness" for the past 6 years. States his symptoms began "after I had seeds implanted for prostate CA." Pt states the rectal bleeding has increased over the past several days. Pt states he went to his Uro MD for evaluation, and was sent to the ED. Pt denies black or blood in stools, no rectal drainage, no fevers, no abd pain, no N/V/D, no CP/SOB.      Past Medical History:  Diagnosis Date  . Arthritis    rheumatoid in his hands  . Atrial fibrillation ([place])   . Bulging lumbar disc   . Cancer ([place])   . DDD (degenerative disc disease), cervical   . Diabetes mellitus   . Diabetic retinopathy ([place])   . Double vision   . Dysrhythmia    Atrial Fibrillation  . History of prostate cancer   . Hypertension   . Low back pain   . Nephrolithiasis   . Sleep apnea    uses cpap    Patient Active Problem List   Diagnosis Date Noted  . OSA (obstructive sleep apnea) 09/02/2014  . HNP (herniated nucleus pulposus), lumbar 08/18/2013  . Type II or unspecified type diabetes mellitus without mention of complication, uncontrolled 12/03/2012  . Anemia 12/03/2012  . Occipital scalp laceration 12/19/2011  . DM type 2 (diabetes mellitus, type 2) ([place]) 12/19/2011  . Hypertension 12/19/2011  . Diabetic retinopathy associated with type 2 diabetes mellitus ([place]) 12/19/2011  . Hyperlipidemia LDL goal < 70 12/19/2011  . History of prostate cancer   . Prostate cancer ([place]) 03/31/2011    Past Surgical History:  Procedure Laterality Date  . APPENDECTOMY    . BACK SURGERY    . Bilateral L4-5 lumbar decompression including bilateral  11/2008  .  brachytherapy    . COLONOSCOPY    . CYSTOSCOPY    . Left L4-L5 lumbar laminotomy and microdiskectomy with  07/14/2008    . LITHOTRIPSY    . LUMBAR LAMINECTOMY/DECOMPRESSION MICRODISCECTOMY Left 08/18/2013   Procedure: Left Lumbar Five-Sacral One Microdiskectomy;  Surgeon: [name], MD;  Location: [facility];  Service: Neurosurgery;  Laterality: Left;  Left Lumbar Five-Sacral One Microdiskectomy  . rotator cuff surgery    . Scleral buckle, laser photocoagulation, and anterior chamber  01/04/2009       Home Medications    Prior to Admission medications   Medication Sig Start Date End Date Taking? Authorizing Provider  apixaban (ELIQUIS) 5 MG TABS tablet Take 5 mg by mouth 2 (two) times daily.   Yes Historical Provider, MD  benazepril (LOTENSIN) 40 MG tablet Take 40 mg by mouth at bedtime.    Yes Historical Provider, MD  ezetimibe (ZETIA) 10 MG tablet Take 10 mg by mouth at bedtime.    Yes Historical Provider, MD  fluticasone (CUTIVATE) 0.05 % cream Apply 1 application topically daily. 05/30/16  Yes Historical Provider, MD  FREESTYLE LITE test strip USE TO CHECK BLOOD SUGAR FOUR TIMES A DAY AS INSTRUCTED 01/24/16  Yes [name], MD  Insulin Disposable Pump (V-GO 20) KIT USE ONE PER DAY 11/04/15  Yes [name], MD  ketoconazole (NIZORAL) 2 % cream Apply 1 application topically daily. 05/30/16  Yes Historical Provider, MD  Lancets (FREESTYLE) lancets Use as instructed to check blood sugar 4 times per day dx code 250.00 12/10/13  Yes [name], MD  metFORMIN (GLUCOPHAGE) 1000 MG tablet TAKE 1 TABLET TWICE A DAY WITH MEALS 03/05/16  Yes [name], MD  NOVOLOG 100 UNIT/ML injection USE UP TO 56 UNITS IN V-GO PUMP DAILY AS DIRECTED 05/26/16  Yes [name], MD  simvastatin (ZOCOR) 20 MG tablet Take 20 mg by mouth at bedtime.    Yes Historical Provider, MD  Cyanocobalamin (VITAMIN B 12 PO) Take 1 tablet by mouth daily. Reported on 10/18/2015    Historical Provider, MD  DUREZOL 0.05 % EMUL  Reported on 10/18/2015 01/21/15   Historical Provider, MD    Family History Family History  Problem Relation Age of Onset  . Heart failure Father   . Heart failure Mother   . Heart failure Brother     Half brother.    Social History Social History  Substance Use Topics  . Smoking status: Former Smoker    Quit date: 07/12/1972  . Smokeless tobacco: Never Used  . Alcohol use No     Allergies   Other and Tramadol   Review of Systems Review of Systems ROS: Statement: All systems negative except as marked or noted in the HPI; Constitutional: Negative for fever and chills. ; ; Eyes: Negative for eye pain, redness and discharge. ; ; ENMT: Negative for ear pain, hoarseness, nasal congestion, sinus pressure and sore throat. ; ; Cardiovascular: Negative for chest pain, palpitations, diaphoresis, dyspnea and peripheral edema. ; ; Respiratory: Negative for cough, wheezing and stridor. ; ; Gastrointestinal: Negative for nausea, vomiting, diarrhea, abdominal pain, hematemesis, jaundice and +rectal bleeding. . ; ; Genitourinary: Negative for dysuria, flank pain and hematuria. ; ; Musculoskeletal: Negative for back pain and neck pain. Negative for swelling and trauma.; ; Skin: Negative for pruritus, rash, abrasions, blisters, bruising and skin lesion.; ; Neuro: Negative for headache, lightheadedness and neck stiffness. Negative for weakness, altered level of consciousness, altered mental status, extremity weakness, paresthesias, involuntary movement, seizure and syncope.      Physical Exam Updated Vital Signs BP 132/87   Pulse (!) 136   Temp 97.4 F (36.3 C) (Oral)   Resp 20   Ht _0  (1.778 m)   Wt 219 lb (99.3 kg)   SpO2 98%   BMI 31.42 kg/m    Patient Vitals for the past 24 hrs:  BP Temp Temp src Pulse Resp SpO2 Height Weight  06/13/16 1600 129/76 - - 87 20 97 % - -  06/13/16 1530 122/85 - - 93 19 95 % - -  06/13/16 1400 116/67 - - (!) 125 18 95 % - -  06/13/16 1322 - - - - - - 5'  10" (1.778 m) -  06/13/16 1321 - - - - - - _1  (1.778 m) 219 lb (99.3 kg)  06/13/16 1320 132/87 97.4 F (36.3 C) Oral (!) 136 20 98 % - -      Physical Exam 1350: Physical examination:  Nursing notes reviewed; Vital signs and O2 SAT reviewed;  Constitutional: Well developed, Well nourished, Well hydrated, In no acute distress; Head:  Normocephalic, atraumatic; Eyes: EOMI, PERRL, No scleral icterus; ENMT: Mouth and pharynx normal, Mucous membranes moist; Neck: Supple, Full range of motion, No lymphadenopathy; Cardiovascular: Tachycardic rate and irregular rhythm, No gallop. HR 120-150's during my exam.;  Respiratory: Breath sounds clear & equal bilaterally, No wheezes.  Speaking full sentences with ease, Normal respiratory effort/excursion; Chest: Nontender, Movement normal; Abdomen: Soft, Nontender, Nondistended, Normal bowel sounds. Rectal exam performed w/permission of pt and ED RN chaperone present.  Anal tone normal.  Non-tender, minimal stool in rectal vault, heme positive.  No fissures, +external hemorrhoid without thrombosis or bleeding, no open wounds, no palp masses.; Genitourinary: No CVA tenderness; Extremities: Pulses normal, No tenderness, No edema, No calf edema or asymmetry.; Neuro: AA&Ox3, Major CN grossly intact.  Speech clear. No gross focal motor or sensory deficits in extremities.; Skin: Color normal, Warm, Dry.   ED Treatments / Results  Labs (all labs ordered are listed, but only abnormal results are displayed)   EKG  EKG Interpretation  Date/Time:  Tuesday June 13 2016 13:35:48 EST Ventricular Rate:  122 PR Interval:    QRS Duration: 109 QT Interval:  329 QTC Calculation: 465 R Axis:   58 Text Interpretation:  Atrial fibrillation Ventricular premature complex Nonspecific T abnormalities, lateral leads Baseline wander When compared with ECG of 08/18/2013 Atrial fibrillation has replaced Normal sinus rhythm Confirmed by [facility]  MD, Nunzio Cory [phone]) on 06/13/2016  2:32:02 PM       EKG Interpretation  Date/Time:  Tuesday June 13 2016 16:39:57 EST Ventricular Rate:  102 PR Interval:    QRS Duration: 90 QT Interval:  360 QTC Calculation: 469 R Axis:   37 Text Interpretation:  Atrial fibrillation Since last tracing of earlier today Rate slower Confirmed by [facility]  MD, Nunzio Cory [phone]) on 06/13/2016 4:58:14 PM         Radiology   Procedures Procedures (including critical care time)  Medications Ordered in ED Medications  diltiazem (CARDIZEM) 1 mg/mL load via infusion 10 mg (not administered)    And  diltiazem (CARDIZEM) 100 mg in dextrose 5% 128m (1 mg/mL) infusion (not administered)  iopamidol (ISOVUE-300) 61 % injection (not administered)     Initial Impression / Assessment and Plan / ED Course  I have reviewed the triage vital signs and the nursing notes.  Pertinent labs & imaging results that were available during my care of the patient were reviewed by me and considered in my medical decision making (see chart for details).  MDM Reviewed: previous chart, nursing note and vitals Reviewed previous: labs and ECG Interpretation: labs, ECG and CT scan Total time providing critical care: 30-74 minutes. This excludes time spent performing separately reportable procedures and services. Consults: cardiology and admitting MD   CRITICAL CARE Performed by: MAlfonzo FellerTotal critical care time: 35 minutes Critical care time was exclusive of separately billable procedures and treating other patients. Critical care was necessary to treat or prevent imminent or life-threatening deterioration. Critical care was time spent personally by me on the following activities: development of treatment plan with patient and/or surrogate as well as nursing, discussions with consultants, evaluation of patient's response to treatment, examination of patient, obtaining history from patient or surrogate, ordering and performing treatments and  interventions, ordering and review of laboratory studies, ordering and review of radiographic studies, pulse oximetry and re-evaluation of patient's condition.  Results for orders placed or performed during the hospital encounter of 06/13/16  Comprehensive metabolic panel  Result Value Ref Range   Sodium 135 135 - 145 mmol/L   Potassium 4.0 3.5 - 5.1 mmol/L   Chloride 103 101 - 111 mmol/L   CO2 22 22 - 32 mmol/L   Glucose, Bld 315 (H) 65 - 99 mg/dL  BUN 29 (H) 6 - 20 mg/dL   Creatinine, Ser 1.43 (H) 0.61 - 1.24 mg/dL   Calcium 9.5 8.9 - 10.3 mg/dL   Total Protein 7.4 6.5 - 8.1 g/dL   Albumin 4.1 3.5 - 5.0 g/dL   AST 24 15 - 41 U/L   ALT 18 17 - 63 U/L   Alkaline Phosphatase 42 38 - 126 U/L   Total Bilirubin 0.5 0.3 - 1.2 mg/dL   GFR calc non Af Amer 46 (L) >60 mL/min   GFR calc Af Amer 54 (L) >60 mL/min   Anion gap 10 5 - 15  CBC  Result Value Ref Range   WBC 7.2 4.0 - 10.5 K/uL   RBC 4.25 4.22 - 5.81 MIL/uL   Hemoglobin 13.2 13.0 - 17.0 g/dL   HCT 38.8 (L) 39.0 - 52.0 %   MCV 91.3 78.0 - 100.0 fL   MCH 31.1 26.0 - 34.0 pg   MCHC 34.0 30.0 - 36.0 g/dL   RDW 12.7 11.5 - 15.5 %   Platelets 273 150 - 400 K/uL  Troponin I  Result Value Ref Range   Troponin I <0.03 <0.03 ng/mL  POC occult blood, ED  Result Value Ref Range   Fecal Occult Bld POSITIVE (A) NEGATIVE  Type and screen [facility]  Result Value Ref Range   ABO/RH(D) O POS    Antibody Screen NEG    Sample Expiration 06/16/2016     Ct Abdomen Pelvis W Contrast Result Date: 06/13/2016 CLINICAL DATA:  Rectal bleeding, dryness occasional bleeding since diagnosed with prostate cancer 6 years ago and had brachytherapy seed placement, increased bleeding over past couple days, burning in rectum, history hypertension, diabetes mellitus, nephrolithiasis, atrial fibrillation EXAM: CT ABDOMEN AND PELVIS WITH CONTRAST TECHNIQUE: Multidetector CT imaging of the abdomen and pelvis was performed using the standard protocol  following bolus administration of intravenous contrast. Sagittal and coronal MPR images reconstructed from axial data set. CONTRAST:  189m ISOVUE-300 IOPAMIDOL (ISOVUE-300) INJECTION 61% IV. Dilute oral contrast. COMPARISON:  12/19/2011 FINDINGS: Lower chest: Minimal RIGHT basilar atelectasis versus scarring. Hepatobiliary: 7 mm low-attenuation lesion RIGHT lobe liver image 39 previously 6 mm. 12 mm cyst far LEFT lateral aspect of the lateral segment LEFT lobe previously 9 mm diameter. Gallbladder and liver otherwise normal appearance. Pancreas: Normal appearance Spleen: Normal appearance Adrenals/Urinary Tract: Adrenal glands, kidneys, ureters, and bladder normal appearance. Brachytherapy seed implants at prostate bed. Stomach/Bowel: Appendix surgically absent per ED HR. Questionable rectal wall thickening versus artifact from underdistention at the distal rectum, immediately posterior to the prostate gland. Stomach and bowel loops otherwise normal appearance. Vascular/Lymphatic: Atherosclerotic calcifications aorta and scattered abdominal/ pelvic vessels without aortic aneurysm. No adenopathy. Coronary artery calcifications also noted. Reproductive: N/A Other: No free air free fluid.  No inflammatory process or hernia. Musculoskeletal: Prior posterior fusion L4-L5.  Bones demineralized. IMPRESSION: Questionable rectal wall thickening anterior to the prostate gland versus artifact from underdistention; recommend correlation with proctoscopy to exclude inflammatory process or tumor. No other significant intra-abdominal or intrapelvic abnormalities. Aortic atherosclerosis and coronary arterial ossification. Electronically Signed   By: MLavonia DanaM.D.   On: 06/13/2016 16:48     1620:  IV cardizem bolus and gtt started; HR now 80-90's. Pt already on Eliquis, but no rate control med. T/C to [facility] Dr. KBronson Ing case discussed, including:  HPI, pertinent PM/SHx, VS/PE, dx testing, ED course and treatment:  Agrees  pt should start afib rate control med, stop benazepril, start cardizem CD 1298m  PO daily (give dose now), and pt can f/u in office.   1810:  CT with questionable rectal thickening; will tx with cipro/flagyl, f/u GI MD. HR continues improved after IV and PO cardizem. Pt states he has a Cards MD he has seen previously (Dr. Einar Gip) and will f/u with him. Pt will continue Eliquis. H/H is stable today. Pt has tol PO well while in the ED without N/V. Continues to deny CP/SOB and abd remains benign. Pt wants to go home now; does not want admission. Pt appears stable for d/c at this time. Return precautions given; pt verb understanding. Dx and testing d/w pt.  Questions answered.  Verb understanding, agreeable to d/c home with outpt f/u.  1840:  Pt's HR now 140-150's, afib/RVR on monitor. Pt offered admission again; will think about it.  1900:  Agreeable to stay. Will re-dose IV cardizem bolus and gtt. T/C to [facility] Dr. Shanon Brow, case discussed, including:  HPI, pertinent PM/SHx, VS/PE, dx testing, ED course and treatment:  Agreeable to admit, requests to write temporary orders, obtain stepdown bed to team APAdmits.      Final Clinical Impressions(s) / ED Diagnoses   Final diagnoses:  None    New Prescriptions New Prescriptions   No medications on file     Francine Graven, DO 06/17/16 1325

## 2016-06-13 NOTE — ED Notes (Signed)
Pt HR before discharging is now 146 consistently, Dr. Thurnell Garbe notified and pt was offered to stay in hospital to control a-fib, but is unsure if he is wanting to stay at this time.

## 2016-06-13 NOTE — ED Notes (Addendum)
Pt is now agreeing to stay in the hospital.  Dr. Thurnell Garbe notified and has placed new orders for cardizem drip and to re-establish IV. Kendell Bane, oncoming primary RN notified of these updates. Order for cardizem waiting for verification by pharmacist.

## 2016-06-14 ENCOUNTER — Telehealth: Payer: Self-pay | Admitting: Internal Medicine

## 2016-06-14 DIAGNOSIS — E11319 Type 2 diabetes mellitus with unspecified diabetic retinopathy without macular edema: Secondary | ICD-10-CM | POA: Diagnosis not present

## 2016-06-14 DIAGNOSIS — I4891 Unspecified atrial fibrillation: Secondary | ICD-10-CM | POA: Diagnosis not present

## 2016-06-14 DIAGNOSIS — N410 Acute prostatitis: Secondary | ICD-10-CM

## 2016-06-14 DIAGNOSIS — Z794 Long term (current) use of insulin: Secondary | ICD-10-CM | POA: Diagnosis not present

## 2016-06-14 DIAGNOSIS — Z8546 Personal history of malignant neoplasm of prostate: Secondary | ICD-10-CM | POA: Diagnosis not present

## 2016-06-14 LAB — CBC
HEMATOCRIT: 34.6 % — AB (ref 39.0–52.0)
Hemoglobin: 11.8 g/dL — ABNORMAL LOW (ref 13.0–17.0)
MCH: 31.3 pg (ref 26.0–34.0)
MCHC: 34.1 g/dL (ref 30.0–36.0)
MCV: 91.8 fL (ref 78.0–100.0)
Platelets: 264 10*3/uL (ref 150–400)
RBC: 3.77 MIL/uL — AB (ref 4.22–5.81)
RDW: 12.9 % (ref 11.5–15.5)
WBC: 7.1 10*3/uL (ref 4.0–10.5)

## 2016-06-14 LAB — BASIC METABOLIC PANEL
Anion gap: 9 (ref 5–15)
BUN: 29 mg/dL — AB (ref 6–20)
CHLORIDE: 102 mmol/L (ref 101–111)
CO2: 25 mmol/L (ref 22–32)
Calcium: 9.1 mg/dL (ref 8.9–10.3)
Creatinine, Ser: 1.34 mg/dL — ABNORMAL HIGH (ref 0.61–1.24)
GFR calc Af Amer: 58 mL/min — ABNORMAL LOW (ref >60)
GFR calc non Af Amer: 50 mL/min — ABNORMAL LOW (ref >60)
Glucose, Bld: 210 mg/dL — ABNORMAL HIGH (ref 65–99)
POTASSIUM: 4.4 mmol/L (ref 3.5–5.1)
Sodium: 136 mmol/L (ref 135–145)

## 2016-06-14 LAB — MRSA PCR SCREENING: MRSA by PCR: NEGATIVE

## 2016-06-14 LAB — GLUCOSE, CAPILLARY: Glucose-Capillary: 219 mg/dL — ABNORMAL HIGH (ref 65–99)

## 2016-06-14 MED ORDER — DILTIAZEM HCL ER COATED BEADS 120 MG PO CP24: 120.0000 mg | ORAL_CAPSULE | Freq: Every day | ORAL | 0 refills | Status: DC

## 2016-06-14 MED ORDER — METRONIDAZOLE 500 MG PO TABS: 500.0000 mg | ORAL_TABLET | Freq: Three times a day (TID) | ORAL | 0 refills | Status: DC

## 2016-06-14 MED ORDER — CIPROFLOXACIN HCL 500 MG PO TABS: 500.0000 mg | ORAL_TABLET | Freq: Two times a day (BID) | ORAL | 0 refills | Status: DC

## 2016-06-14 NOTE — Discharge Instructions (Signed)
Take the prescriptions as directed.  Call your regular medical doctor and the Cardiologist tomorrow to schedule a follow up appointment within the next 2 days. Call the GI doctor to schedule a follow up appointment within the next week.  Return to the Emergency Department immediately sooner if worsening SYMPTOMS OR SYMPTOMS RETURN.   DONT TAKE METFORMIN FOR 2 DAYS.  YOU CAN RESTART IT ON Friday

## 2016-06-14 NOTE — Telephone Encounter (Signed)
Pt called to say that he had been in the ER for rectal bleeding and was advised to follow up with [place]. He prefers seeing a male  ([facility] or EG). Please advise if we can accept him as a new patient.

## 2016-06-14 NOTE — Progress Notes (Signed)
Being discharged to home. IV access removed and prescriptions given. Discharge information and education completed.

## 2016-06-14 NOTE — Discharge Summary (Signed)
Physician Discharge Summary  Jonathan Hensley:[id number] DOB: 13-Aug-1940 DOA: 06/13/2016  PCP: [name], MD  Admit date: 06/13/2016 Discharge date: 06/14/2016  Admitted From: Home  Disposition:  Home  Recommendations for Outpatient Follow-up:  1. Follow up with cardiologist in 1-3 days, Follow up with GI  in 1-2 weeks, Follow up with urologist in 1-2 weeks 2. Please obtain BMP/CBC in one week  Discharge Condition: STABLE  CODE STATUS: FULL  Diet recommendation: Heart Healthy / Carb Modified  Brief/Interim Summary: HPI: Jonathan Hensley is a 76 y.o. male with medical history significant of  Prostate cancer, afib on eloquis, HTN comes in with complaints of mild rectal blood after a BM today.  He denies any fevers.  No chills.  No dysuria or hematuria.  Pt was diagnosed with proctatitis by dr Elise Benne in ED given cipro and flagyl orally and was being prepared to go home.  He was in afib with rvr in ED, given an oral load of diltiazem and bolus.  He converted to NSR then as he was getting up he went into afib with rvr again, at which time he was referred for admission.  Pt is on cardizem 59m/hour and is now back in NSR.  He is requesting to go home.  He says he was hungry and hasnt eaten all day in the ED and that's why his heart rate is high.    Atrial fibrillation with rapid ventricular response - loaded with 1265mof oral cardizem in ED and started on IV diltiazem infusion which was eventually weaned to off when HR was controlled.  Pt was adamant that he go home and says he will follow up with his cardiologist Dr. GaEinar Gipnd says that he will follow up outpatient with GI and urology.  He declined to have inpatient cardiology consultation.   Prior to discharge he was In NSR now with rate in the 60-80s.  He was given Rx for diltiazem, also given Rx for cipro and flagyl and follow up with GI and urology and cardiology strongly advised.  Pt was adamant that he was not going to stay any longer and was  discharged to home with close outpatient follow up.  Pt is going to discuss continuing eliquis with Dr. GaEinar Gip Says that he is the doctor that he trusts about this.      DM type 2 (diabetes mellitus, type 2) - Pt advised to hold metformin for at least 48 hours prior to restarting.     History of prostate cancer- noted, cont outpatient follow up with urology   Prostatitis- cont cipro.  Needs outpatient follow up with urology   GI bleeding - Outpatient GI follow up appointment  Pt strongly advised to Return to ED if symptoms return and he verbalized understanding.    CKD - stable, follow up out-patient.    Discharge Diagnoses:  Principal Problem:   Atrial fibrillation with rapid ventricular response (HCC) Active Problems:   DM type 2 (diabetes mellitus, type 2) ([facility]  History of prostate cancer   Prostatitis  Discharge Instructions  Discharge Instructions    Diet - low sodium heart healthy    Complete by:  As directed    Increase activity slowly    Complete by:  As directed      Allergies as of 06/14/2016      Reactions   Other Other (See Comments)   Beta or alpha blockers: severe dizziness   Tramadol Other (See Comments)   Dizziness  Medication List    STOP taking these medications   metFORMIN 1000 MG tablet Commonly known as:  GLUCOPHAGE     TAKE these medications   benazepril 40 MG tablet Commonly known as:  LOTENSIN Take 40 mg by mouth at bedtime.   ciprofloxacin 500 MG tablet Commonly known as:  CIPRO Take 1 tablet (500 mg total) by mouth 2 (two) times daily.   diltiazem 120 MG 24 hr capsule Commonly known as:  CARDIZEM CD Take 1 capsule (120 mg total) by mouth daily.   DUREZOL 0.05 % Emul Generic drug:  Difluprednate Reported on 10/18/2015   ELIQUIS 5 MG Tabs tablet Generic drug:  apixaban Take 5 mg by mouth 2 (two) times daily.   ezetimibe 10 MG tablet Commonly known as:  ZETIA Take 10 mg by mouth at bedtime.   fluticasone 0.05 %  cream Commonly known as:  CUTIVATE Apply 1 application topically daily.   freestyle lancets Use as instructed to check blood sugar 4 times per day dx code 250.00   FREESTYLE LITE test strip Generic drug:  glucose blood USE TO CHECK BLOOD SUGAR FOUR TIMES A DAY AS INSTRUCTED   ketoconazole 2 % cream Commonly known as:  NIZORAL Apply 1 application topically daily.   metroNIDAZOLE 500 MG tablet Commonly known as:  FLAGYL Take 1 tablet (500 mg total) by mouth 3 (three) times daily.   NOVOLOG 100 UNIT/ML injection Generic drug:  insulin aspart USE UP TO 56 UNITS IN V-GO PUMP DAILY AS DIRECTED   simvastatin 20 MG tablet Commonly known as:  ZOCOR Take 20 mg by mouth at bedtime.   V-GO 20 Kit USE ONE PER DAY   VITAMIN B 12 PO Take 1 tablet by mouth daily. Reported on 10/18/2015      Follow-up Information    Adrian Prows, MD. Schedule an appointment as soon as possible for a visit in 2 day(s).   Specialty:  Cardiology Contact information: [address] [place] [address] [phone]        Barney Drain, MD. Schedule an appointment as soon as possible for a visit in 1 week(s).   Specialty:  Gastroenterology Contact information: [place] [address] [phone]        [facility], MD. Schedule an appointment as soon as possible for a visit in 2 day(s).   Specialty:  Endocrinology Contact information: [address] [place] [address] [phone]          Allergies  Allergen Reactions  . Other Other (See Comments)    Beta or alpha blockers: severe dizziness  . Tramadol Other (See Comments)    Dizziness     Procedures/Studies: Ct Abdomen Pelvis W Contrast  Result Date: 06/13/2016 CLINICAL DATA:  Rectal bleeding, dryness occasional bleeding since diagnosed with prostate cancer 6 years ago and had brachytherapy seed placement, increased bleeding over past couple days, burning in rectum, history hypertension,  diabetes mellitus, nephrolithiasis, atrial fibrillation EXAM: CT ABDOMEN AND PELVIS WITH CONTRAST TECHNIQUE: Multidetector CT imaging of the abdomen and pelvis was performed using the standard protocol following bolus administration of intravenous contrast. Sagittal and coronal MPR images reconstructed from axial data set. CONTRAST:  159m ISOVUE-300 IOPAMIDOL (ISOVUE-300) INJECTION 61% IV. Dilute oral contrast. COMPARISON:  12/19/2011 FINDINGS: Lower chest: Minimal RIGHT basilar atelectasis versus scarring. Hepatobiliary: 7 mm low-attenuation lesion RIGHT lobe liver image 39 previously 6 mm. 12 mm cyst far LEFT lateral aspect of the lateral segment LEFT lobe previously  9 mm diameter. Gallbladder and liver otherwise normal appearance. Pancreas: Normal appearance Spleen: Normal appearance Adrenals/Urinary Tract: Adrenal glands, kidneys, ureters, and bladder normal appearance. Brachytherapy seed implants at prostate bed. Stomach/Bowel: Appendix surgically absent per ED HR. Questionable rectal wall thickening versus artifact from underdistention at the distal rectum, immediately posterior to the prostate gland. Stomach and bowel loops otherwise normal appearance. Vascular/Lymphatic: Atherosclerotic calcifications aorta and scattered abdominal/ pelvic vessels without aortic aneurysm. No adenopathy. Coronary artery calcifications also noted. Reproductive: N/A Other: No free air free fluid.  No inflammatory process or hernia. Musculoskeletal: Prior posterior fusion L4-L5.  Bones demineralized. IMPRESSION: Questionable rectal wall thickening anterior to the prostate gland versus artifact from underdistention; recommend correlation with proctoscopy to exclude inflammatory process or tumor. No other significant intra-abdominal or intrapelvic abnormalities. Aortic atherosclerosis and coronary arterial ossification. Electronically Signed   By: Lavonia Dana M.D.   On: 06/13/2016 16:48    (Echo, Carotid, EGD, Colonoscopy,  ERCP)    Subjective: Pt says he feels much better.  He is in no distress No chest pain No SOB, no palpitations.  Says he is going home  Discharge Exam: Vitals:   06/14/16 0600 06/14/16 0740  BP: 120/60   Pulse: (!) 58 62  Resp: 15 18  Temp:  97.9 F (36.6 C)   Vitals:   06/14/16 0400 06/14/16 0500 06/14/16 0600 06/14/16 0740  BP: 107/60 112/70 120/60   Pulse: 63 (!) 59 (!) 58 62  Resp: 17 18 15 18   Temp: 97.8 F (36.6 C)   97.9 F (36.6 C)  TempSrc: Oral   Oral  SpO2: 95% 95% 94% 97%  Weight:  102.7 kg (226 lb 6.6 oz)    Height:        General: Pt is alert, awake, not in acute distress Cardiovascular: RRR, S1/S2 +, no rubs, no gallops Respiratory: CTA bilaterally, no wheezing, no rhonchi Abdominal: Soft, NT, ND, bowel sounds + Extremities: no edema, no cyanosis  The results of significant diagnostics from this hospitalization (including imaging, microbiology, ancillary and laboratory) are listed below for reference.     Microbiology: Recent Results (from the past 240 hour(s))  MRSA PCR Screening     Status: None   Collection Time: 06/13/16 11:01 PM  Result Value Ref Range Status   MRSA by PCR NEGATIVE NEGATIVE Final    Comment:        The GeneXpert MRSA Assay (FDA approved for NASAL specimens only), is one component of a comprehensive MRSA colonization surveillance program. It is not intended to diagnose MRSA infection nor to guide or monitor treatment for MRSA infections.      Labs: BNP (last 3 results) No results for input(s): BNP in the last 8760 hours. Basic Metabolic Panel:  Recent Labs Lab 06/13/16 1340 06/14/16 0426  NA 135 136  K 4.0 4.4  CL 103 102  CO2 22 25  GLUCOSE 315* 210*  BUN 29* 29*  CREATININE 1.43* 1.34*  CALCIUM 9.5 9.1   Liver Function Tests:  Recent Labs Lab 06/13/16 1340  AST 24  ALT 18  ALKPHOS 42  BILITOT 0.5  PROT 7.4  ALBUMIN 4.1   No results for input(s): LIPASE, AMYLASE in the last 168 hours. No  results for input(s): AMMONIA in the last 168 hours. CBC:  Recent Labs Lab 06/13/16 1340 06/14/16 0426  WBC 7.2 7.1  HGB 13.2 11.8*  HCT 38.8* 34.6*  MCV 91.3 91.8  PLT 273 264   Cardiac Enzymes:  Recent Labs Lab  06/13/16 1433  TROPONINI <0.03   BNP: Invalid input(s): POCBNP CBG:  Recent Labs Lab 06/13/16 1859 06/13/16 2256 06/14/16 0738  GLUCAP 167* 237* 219*   D-Dimer No results for input(s): DDIMER in the last 72 hours. Hgb A1c No results for input(s): HGBA1C in the last 72 hours. Lipid Profile No results for input(s): CHOL, HDL, LDLCALC, TRIG, CHOLHDL, LDLDIRECT in the last 72 hours. Thyroid function studies No results for input(s): TSH, T4TOTAL, T3FREE, THYROIDAB in the last 72 hours.  Invalid input(s): FREET3 Anemia work up No results for input(s): VITAMINB12, FOLATE, FERRITIN, TIBC, IRON, RETICCTPCT in the last 72 hours. Urinalysis    Component Value Date/Time   COLORURINE YELLOW 10/19/2015 1109   APPEARANCEUR CLEAR 10/19/2015 1109   LABSPEC 1.015 10/19/2015 1109   PHURINE 5.0 10/19/2015 1109   GLUCOSEU NEGATIVE 10/19/2015 1109   HGBUR TRACE-INTACT (A) 10/19/2015 1109   BILIRUBINUR NEGATIVE 10/19/2015 1109   KETONESUR NEGATIVE 10/19/2015 1109   PROTEINUR 30 (A) 12/19/2011 1425   UROBILINOGEN 0.2 10/19/2015 1109   NITRITE NEGATIVE 10/19/2015 1109   LEUKOCYTESUR NEGATIVE 10/19/2015 1109   Sepsis Labs Invalid input(s): PROCALCITONIN,  WBC,  LACTICIDVEN Microbiology Recent Results (from the past 240 hour(s))  MRSA PCR Screening     Status: None   Collection Time: 06/13/16 11:01 PM  Result Value Ref Range Status   MRSA by PCR NEGATIVE NEGATIVE Final    Comment:        The GeneXpert MRSA Assay (FDA approved for NASAL specimens only), is one component of a comprehensive MRSA colonization surveillance program. It is not intended to diagnose MRSA infection nor to guide or monitor treatment for MRSA infections.     Time coordinating  discharge: Over 30 minutes  SIGNED:   Irwin Brakeman, MD  [facility] 06/14/2016, 8:03 AM Pager   If 7PM-7AM, please contact night-coverage [email or web address] Password [email or web address]

## 2016-06-14 NOTE — Progress Notes (Signed)
Paged Baltazar Najjar regarding Cardizem drip and patient status, HR Afib 60's. New orders placed to stop Cardizem and page night md if hr goes above 110 and ok to give 40mg  benazepril.

## 2016-06-15 NOTE — Telephone Encounter (Signed)
OK to schedule. Needs appt within 1-2 weeks.

## 2016-06-15 NOTE — Telephone Encounter (Signed)
lmom of appt date and time.

## 2016-06-16 DIAGNOSIS — I480 Paroxysmal atrial fibrillation: Secondary | ICD-10-CM | POA: Diagnosis not present

## 2016-06-16 DIAGNOSIS — G4733 Obstructive sleep apnea (adult) (pediatric): Secondary | ICD-10-CM | POA: Diagnosis not present

## 2016-06-16 DIAGNOSIS — I10 Essential (primary) hypertension: Secondary | ICD-10-CM | POA: Diagnosis not present

## 2016-06-24 ENCOUNTER — Other Ambulatory Visit: Payer: Self-pay | Admitting: Endocrinology

## 2016-07-05 ENCOUNTER — Ambulatory Visit: Payer: Medicare Other | Admitting: Nurse Practitioner

## 2016-07-05 ENCOUNTER — Encounter: Payer: Self-pay | Admitting: Neurology

## 2016-07-05 ENCOUNTER — Ambulatory Visit (INDEPENDENT_AMBULATORY_CARE_PROVIDER_SITE_OTHER): Payer: Medicare Other | Admitting: Neurology

## 2016-07-05 VITALS — BP 138/72 | HR 63 | Ht 70.0 in | Wt 227.0 lb

## 2016-07-05 DIAGNOSIS — R4789 Other speech disturbances: Secondary | ICD-10-CM | POA: Diagnosis not present

## 2016-07-05 NOTE — Patient Instructions (Signed)
1. Schedule MRI brain without contrast 2. If MRI is normal, consider starting medication to help slow down worsening of memory (Aricept) 3. Physical exercise and brain stimulation exercises (reading, crossword puzzles, etc) are important for brain health 4. Follow-up in 6 months

## 2016-07-05 NOTE — Progress Notes (Signed)
NEUROLOGY CONSULTATION NOTE  Jonathan Hensley MRN: [id number] DOB: 04/27/1941  Referring provider: Dr. [name] Primary care provider: Dr. [name]  Reason for consult:  Word-finding difficulties, memory loss  Dear Dr Dwyane Dee  Thank you for your kind referral of Jonathan Hensley for consultation of the above symptoms. Although his history is well known to you, please allow me to reiterate it for the purpose of our medical record. Records and images were personally reviewed where available.  HISTORY OF PRESENT ILLNESS: This is a pleasant 76 year old right-handed man with a history of diabetes, atrial fibrillation, sleep apnea, presenting for evaluation of word-finding difficulties. He started noticing changes a year ago, but they have worsened to the point now where he goes blank with what he was saying. He has difficulty giving the name Zacarias Pontes during the visit today. He states he cannot even say grace at dinner without completely stopping. He denies getting lost driving, no missed medications. His wife is in charge of bill payments. He denies any problems multitasking or frequently misplacing things. He denies any family history of memory loss, no alcohol intake. He had a head injury in 2013 requiring staples. He had seen neurologist Dr. Leta Baptist in 2016 for nystagmus that possibly started since his fall in 2013. He had an MRI brain with and without contrast which did not show any acute changes. There was mild perisylvian and moderate mesial temporal atrophy, mild chronic microvascular disease.  He has occasional headaches relieved by Tylenol. He reports a syncopal episode 4 years ago when he was put on a beta-blocker. He denies any diplopia, dysarthria/dysphagia, focal numbness/tingling/weakness. He denies any more neck/back pain since he has had 4 back surgeries. No bowel/bladder dysfunction, anosmia, tremors, or recent falls.   Laboratory Data: Lab Results  Component Value Date   TSH  2.36 05/22/2016   Lab Results  Component Value Date   [id number] 05/22/2016     PAST MEDICAL HISTORY: Past Medical History:  Diagnosis Date  . Arthritis    rheumatoid in his hands  . Atrial fibrillation ([place])   . Bulging lumbar disc   . Cancer ([place])   . DDD (degenerative disc disease), cervical   . Diabetes mellitus   . Diabetic retinopathy ([place])   . Double vision   . Dysrhythmia    Atrial Fibrillation  . History of prostate cancer   . Hypertension   . Low back pain   . Nephrolithiasis   . Sleep apnea    uses cpap    PAST SURGICAL HISTORY: Past Surgical History:  Procedure Laterality Date  . APPENDECTOMY    . BACK SURGERY    . Bilateral L4-5 lumbar decompression including bilateral  11/2008  . brachytherapy    . COLONOSCOPY    . CYSTOSCOPY    . Left L4-L5 lumbar laminotomy and microdiskectomy with  07/14/2008    . LITHOTRIPSY    . LUMBAR LAMINECTOMY/DECOMPRESSION MICRODISCECTOMY Left 08/18/2013   Procedure: Left Lumbar Five-Sacral One Microdiskectomy;  Surgeon: [name], MD;  Location: [facility];  Service: Neurosurgery;  Laterality: Left;  Left Lumbar Five-Sacral One Microdiskectomy  . rotator cuff surgery    . Scleral buckle, laser photocoagulation, and anterior chamber  01/04/2009    MEDICATIONS: Current Outpatient Prescriptions on File Prior to Visit  Medication Sig Dispense Refill  . apixaban (ELIQUIS) 5 MG TABS tablet Take 5 mg by mouth 2 (two) times daily.    . benazepril (LOTENSIN) 40 MG tablet  Take 40 mg by mouth at bedtime.     . ciprofloxacin (CIPRO) 500 MG tablet Take 1 tablet (500 mg total) by mouth 2 (two) times daily. 14 tablet 0  . Cyanocobalamin (VITAMIN B 12 PO) Take 1 tablet by mouth daily. Reported on 10/18/2015    . diltiazem (CARDIZEM CD) 120 MG 24 hr capsule Take 1 capsule (120 mg total) by mouth daily. 20 capsule 0  . DUREZOL 0.05 % EMUL Reported on 10/18/2015    . ezetimibe (ZETIA) 10 MG tablet Take 10 mg by mouth at bedtime.      . fluticasone (CUTIVATE) 0.05 % cream Apply 1 application topically daily.    Marland Kitchen FREESTYLE LITE test strip USE TO CHECK BLOOD SUGAR FOUR TIMES A DAY AS INSTRUCTED 400 each 1  . Insulin Disposable Pump (V-GO 20) KIT USE ONE PER DAY 90 kit 3  . ketoconazole (NIZORAL) 2 % cream Apply 1 application topically daily.    . Lancets (FREESTYLE) lancets Use as instructed to check blood sugar 4 times per day dx code 250.00 400 each 3  . metroNIDAZOLE (FLAGYL) 500 MG tablet Take 1 tablet (500 mg total) by mouth 3 (three) times daily. 21 tablet 0  . NOVOLOG 100 UNIT/ML injection USE UP TO 56 UNITS IN V-GO PUMP DAILY AS DIRECTED 60 mL 2  . simvastatin (ZOCOR) 20 MG tablet Take 20 mg by mouth at bedtime.      No current facility-administered medications on file prior to visit.     ALLERGIES: Allergies  Allergen Reactions  . Other Other (See Comments)    Beta or alpha blockers: severe dizziness  . Tramadol Other (See Comments)    Dizziness     FAMILY HISTORY: Family History  Problem Relation Age of Onset  . Heart failure Father   . Heart failure Mother   . Heart failure Brother     Half brother.    SOCIAL HISTORY: Social History   Social History  . Marital status: Married    Spouse name: [name]  . Number of children: 2  . Years of education: 12   Occupational History  . Not on file.   Social History Main Topics  . Smoking status: Former Smoker    Quit date: 07/12/1972  . Smokeless tobacco: Never Used  . Alcohol use No  . Drug use: No  . Sexual activity: Yes   Other Topics Concern  . Not on file   Social History Narrative   Lives at home with wife   Drinks 4 cups of coffee a day    REVIEW OF SYSTEMS: Constitutional: No fevers, chills, or sweats, no generalized fatigue, change in appetite Eyes: No visual changes, double vision, eye pain Ear, nose and throat: No hearing loss, ear pain, nasal congestion, sore throat Cardiovascular: No chest pain, palpitations Respiratory:   No shortness of breath at rest or with exertion, wheezes GastrointestinaI: No nausea, vomiting, diarrhea, abdominal pain, fecal incontinence Genitourinary:  No dysuria, urinary retention or frequency Musculoskeletal:  No neck pain, back pain Integumentary: No rash, pruritus, skin lesions Neurological: as above Psychiatric: No depression, insomnia, anxiety Endocrine: No palpitations, fatigue, diaphoresis, mood swings, change in appetite, change in weight, increased thirst Hematologic/Lymphatic:  No anemia, purpura, petechiae. Allergic/Immunologic: no itchy/runny eyes, nasal congestion, recent allergic reactions, rashes  PHYSICAL EXAM: Vitals:   07/05/16 1252  BP: 138/72  Pulse: 63   General: No acute distress Head:  Normocephalic/atraumatic Eyes: Fundoscopic exam shows bilateral sharp discs, no vessel changes, exudates,  or hemorrhages Neck: supple, no paraspinal tenderness, full range of motion Back: No paraspinal tenderness Heart: regular rate and rhythm Lungs: Clear to auscultation bilaterally. Vascular: No carotid bruits. Skin/Extremities: No rash, no edema Neurological Exam: Mental status: alert and oriented to person, place, and time, no dysarthria or aphasia, Fund of knowledge is appropriate.  Recent and remote memory are intact.  Attention and concentration are normal.    Able to name objects and repeat phrases.  Montreal Cognitive Assessment  07/05/2016  Visuospatial/ Executive (0/5) 4  Naming (0/3) 3  Attention: Read list of digits (0/2) 2  Attention: Read list of letters (0/1) 1  Attention: Serial 7 subtraction starting at 100 (0/3) 3  Language: Repeat phrase (0/2) 2  Language : Fluency (0/1) 1  Abstraction (0/2) 2  Delayed Recall (0/5) 4  Orientation (0/6) 6  Total 28   Cranial nerves: CN I: not tested CN II: pupils equal, round and reactive to light, visual fields intact, fundi unremarkable. CN III, IV, VI:  full range of motion, very mild rotatory nystagmus on  left and right gaze, no ptosis CN V: facial sensation intact CN VII: upper and lower face symmetric CN VIII: hearing intact to finger rub CN IX, X: gag intact, uvula midline CN XI: sternocleidomastoid and trapezius muscles intact CN XII: tongue midline Bulk & Tone: normal, no fasciculations. Motor: 5/5 throughout with no pronator drift. Sensation: intact to light touch, cold, pin, vibration and joint position sense.  No extinction to double simultaneous stimulation.  Romberg test negative Deep Tendon Reflexes: +1 throughout, no ankle clonus Plantar responses: downgoing bilaterally Cerebellar: no incoordination on finger to nose, heel to shin. No dysdiadochokinesia Gait: narrow-based and steady, able to tandem walk adequately. Tremor: none  IMPRESSION: This is a 76 year old right-handed man with a history of  diabetes, atrial fibrillation, sleep apnea, presenting for evaluation of word-finding difficulties. His neurological exam is non-focal, MOCA score normal 28/30. Word-finding difficulties can be seen with neurodegenerative conditions, however a small stroke can also cause similar symptoms. No clear aphasia on exam today, but by history patient reports concerning word-finding problems. MRI brain without contrast will be ordered to assess for underlying structural abnormality. If MRI is normal, consideration for starting cholinesterase inhibitors such as donepezil will be done. We discussed physical exercise and brain stimulation exercises are important for brain health. He will follow-up in 6 months.   Thank you for allowing me to participate in the care of this patient. Please do not hesitate to call for any questions or concerns.   Ellouise Newer, M.D.  CC: [name]

## 2016-07-07 ENCOUNTER — Ambulatory Visit (INDEPENDENT_AMBULATORY_CARE_PROVIDER_SITE_OTHER): Payer: Medicare Other | Admitting: Nurse Practitioner

## 2016-07-07 ENCOUNTER — Encounter: Payer: Self-pay | Admitting: Nurse Practitioner

## 2016-07-07 VITALS — BP 138/74 | HR 67 | Temp 98.1°F | Ht 70.0 in | Wt 228.4 lb

## 2016-07-07 DIAGNOSIS — K625 Hemorrhage of anus and rectum: Secondary | ICD-10-CM | POA: Diagnosis not present

## 2016-07-07 DIAGNOSIS — C61 Malignant neoplasm of prostate: Secondary | ICD-10-CM | POA: Diagnosis not present

## 2016-07-07 NOTE — Progress Notes (Signed)
Primary Care Physician:  [name], MD Primary Gastroenterologist:  Dr. Gala Romney  Chief Complaint  Patient presents with  . Rectal Bleeding    HPI:   Jonathan Hensley is a 76 y.o. male who presents on Referral from the emergency department for rectal bleeding. He was subsequently admitted to [facility] from 06/13/2016 through 06/14/2016 due to atrial fibrillation with rapid ventricular response. He converted to normal sinus rhythm after loading dose and continuous drip of Cardizem. During his workup he noted he had mild rectal bleeding after bowel movement 1 episode. He has a history of prostate cancer and anticoagulation for atrial fibrillation on Eliquis. No history of colonoscopy or endoscopy in our system. His hemoglobin 3 weeks ago was 11.8 which is near his baseline for the past 8 months of 12.2-13.2.  Today he states he's doing well overall. Had 2 episodes of red rectal bleeding about a week and a half apart. Prostate CA in remission with seed placement, PSAs for the past 7 years have been normal. Has a perianal rash since seed placement. Denies abdominal pain, N/V, melena. No further hematochezia. Denies fever, chills, unintentional weight loss. Has regular and soft bowel movements, no constipation. No hemorrhoids that he knows of. Last colonoscopy was done at [facility] [place] 15[place]16 years ago. States it was normal at that time. Denies chest pain, dyspnea, dizziness, lightheadedness, syncope, near syncope. Denies any other upper or lower GI symptoms.  Past Medical History:  Diagnosis Date  . Arthritis    rheumatoid in his hands  . Atrial fibrillation ([place])   . Bulging lumbar disc   . Cancer ([place])   . DDD (degenerative disc disease), cervical   . Diabetes mellitus   . Diabetic retinopathy ([place])   . Double vision   . Dysrhythmia    Atrial Fibrillation  . History of prostate cancer   . Hypertension   . Low back pain   . Nephrolithiasis   . Sleep apnea     uses cpap    Past Surgical History:  Procedure Laterality Date  . APPENDECTOMY    . BACK SURGERY    . Bilateral L4-5 lumbar decompression including bilateral  11/2008  . brachytherapy    . COLONOSCOPY    . CYSTOSCOPY    . Left L4-L5 lumbar laminotomy and microdiskectomy with  07/14/2008    . LITHOTRIPSY    . LUMBAR LAMINECTOMY/DECOMPRESSION MICRODISCECTOMY Left 08/18/2013   Procedure: Left Lumbar Five-Sacral One Microdiskectomy;  Surgeon: [name], MD;  Location: [place] [facility]UR[facility] ORS;  Service: Neurosurgery;  Laterality: Left;  Left Lumbar Five-Sacral One Microdiskectomy  . rotator cuff surgery    . Scleral buckle, laser photocoagulation, and anterior chamber  01/04/2009    Current Outpatient Prescriptions  Medication Sig Dispense Refill  . apixaban (ELIQUIS) 5 MG TABS tablet Take 5 mg by mouth 2 (two) times daily.    . benazepril (LOTENSIN) 40 MG tablet Take 40 mg by mouth at bedtime.     . ciprofloxacin (CIPRO) 500 MG tablet Take 1 tablet (500 mg total) by mouth 2 (two) times daily. 14 tablet 0  . Cyanocobalamin (VITAMIN B 12 PO) Take 1 tablet by mouth daily. Reported on 10/18/2015    . ezetimibe (ZETIA) 10 MG tablet Take 10 mg by mouth at bedtime.     . fluticasone (CUTIVATE) 0.05 % cream Apply 1 application topically daily.    Marland Kitchen FREESTYLE LITE test strip USE TO CHECK BLOOD SUGAR FOUR TIMES A  DAY AS INSTRUCTED 400 each 1  . Insulin Disposable Pump (V-GO 20) KIT USE ONE PER DAY 90 kit 3  . ketoconazole (NIZORAL) 2 % cream Apply 1 application topically daily.    . Lancets (FREESTYLE) lancets Use as instructed to check blood sugar 4 times per day dx code 250.00 400 each 3  . NOVOLOG 100 UNIT/ML injection USE UP TO 56 UNITS IN V-GO PUMP DAILY AS DIRECTED 60 mL 2  . simvastatin (ZOCOR) 20 MG tablet Take 20 mg by mouth at bedtime.      No current facility-administered medications for this visit.     Allergies as of 07/07/2016 - Review Complete 07/07/2016  Allergen Reaction Noted    . Other Other (See Comments) 03/31/2011  . Tramadol Other (See Comments) 11/14/2014    Family History  Problem Relation Age of Onset  . Heart failure Father   . Heart failure Mother   . Heart failure Brother     Half brother.  . Colon cancer Neg Hx     Social History   Social History  . Marital status: Married    Spouse name: [name]  . Number of children: 2  . Years of education: 12   Occupational History  . Not on file.   Social History Main Topics  . Smoking status: Former Smoker    Quit date: 02/12/1973  . Smokeless tobacco: Never Used  . Alcohol use No  . Drug use: No  . Sexual activity: Yes   Other Topics Concern  . Not on file   Social History Narrative   Lives at home with wife   Drinks 4 cups of coffee a day    Review of Systems: General: Negative for anorexia, weight loss, fever, chills, fatigue, weakness. ENT: Negative for hoarseness, nasal congestion. CV: Negative for chest pain, angina, palpitations, peripheral edema.  Respiratory: Negative for dyspnea at rest, cough, sputum, wheezing.  GI: See history of present illness. GU:  Admits history of prostate CA.  MS: Admits several back surgeries without overt/constant pain but tenderness depending on movement.  Derm: Negative for rash or itching.  Endo: Negative for unusual weight change. Has insulin pump system. Heme: Negative for bruising or bleeding. Allergy: Negative for rash or hives.    Physical Exam: BP 138/74   Pulse 67   Temp 98.1 F (36.7 C) (Oral)   Ht 5' 10"  (1.778 m)   Wt 228 lb 6.4 oz (103.6 kg)   BMI 32.77 kg/m  General:   Alert and oriented. Pleasant and cooperative. Well-nourished and well-developed.  Head:  Normocephalic and atraumatic. Eyes:  Without icterus, sclera clear and conjunctiva pink.  Ears:  Normal auditory acuity. Cardiovascular:  S1, S2 present without murmurs appreciated. Extremities without clubbing or edema. Respiratory:  Clear to auscultation  bilaterally. No wheezes, rales, or rhonchi. No distress.  Gastrointestinal:  +BS, rounded but soft, non-tender and non-distended. No HSM noted. No guarding or rebound. No masses appreciated. Insulin pump in place left-sided abdomen. Rectal:  Deferred  Musculoskalatal:  Symmetrical without gross deformities. Neurologic:  Alert and oriented x4;  grossly normal neurologically. Psych:  Alert and cooperative. Normal mood and affect. Heme/Lymph/Immune: No excessive bruising noted.    07/07/2016 11:17 AM   Disclaimer: This note was dictated with voice recognition software. Similar sounding words can inadvertently be transcribed and may not be corrected upon review.

## 2016-07-07 NOTE — Patient Instructions (Signed)
1. Have your blood tests drawn when you're able to. 2. You can do this at the break office building across the street from the [facility] emergency department [facility] labs). 3. Call us if you have any more bleeding or new GI symptoms.

## 2016-07-07 NOTE — Assessment & Plan Note (Signed)
Noted 2 episodes of bright red blood per rectum about 1-2 weeks apart. Has a chronic perianal rash which she manages adequately with topical cream, likely ketoconazole. This rash is been there since placement of his prostate cancer seeds. PSA has been stable. History of prostate cancer. No overt constipation. Last colonoscopy 15-16 years ago. Given the amount of time since his last colonoscopy and the fact he has had 2 episodes of rectal bleeding I offered him a colonoscopy to further evaluate. He declines at this time. I will check a CBC for overt blood loss and return for follow-up as needed.

## 2016-07-11 ENCOUNTER — Encounter: Payer: Self-pay | Admitting: Neurology

## 2016-07-12 NOTE — Progress Notes (Signed)
CC'ED TO PCP 

## 2016-07-14 DIAGNOSIS — K625 Hemorrhage of anus and rectum: Secondary | ICD-10-CM | POA: Diagnosis not present

## 2016-07-14 LAB — CBC WITH DIFFERENTIAL/PLATELET
BASOS ABS: 0 {cells}/uL (ref 0–200)
Basophils Relative: 0 %
EOS ABS: 153 {cells}/uL (ref 15–500)
Eosinophils Relative: 3 %
HEMATOCRIT: 35 % — AB (ref 38.5–50.0)
HEMOGLOBIN: 11.4 g/dL — AB (ref 13.2–17.1)
LYMPHS ABS: 1326 {cells}/uL (ref 850–3900)
Lymphocytes Relative: 26 %
MCH: 30.7 pg (ref 27.0–33.0)
MCHC: 32.6 g/dL (ref 32.0–36.0)
MCV: 94.3 fL (ref 80.0–100.0)
MPV: 9.7 fL (ref 7.5–12.5)
Monocytes Absolute: 408 cells/uL (ref 200–950)
Monocytes Relative: 8 %
NEUTROS ABS: 3213 {cells}/uL (ref 1500–7800)
Neutrophils Relative %: 63 %
Platelets: 256 10*3/uL (ref 140–400)
RBC: 3.71 MIL/uL — ABNORMAL LOW (ref 4.20–5.80)
RDW: 13.8 % (ref 11.0–15.0)
WBC: 5.1 10*3/uL (ref 3.8–10.8)

## 2016-07-17 ENCOUNTER — Ambulatory Visit
Admission: RE | Admit: 2016-07-17 | Discharge: 2016-07-17 | Disposition: A | Discharge status: Home / Self Care | Payer: Medicare Other | Source: Ambulatory Visit | Attending: Neurology | Admitting: Neurology

## 2016-07-17 DIAGNOSIS — R413 Other amnesia: Secondary | ICD-10-CM | POA: Diagnosis not present

## 2016-07-17 DIAGNOSIS — R4789 Other speech disturbances: Secondary | ICD-10-CM

## 2016-07-19 ENCOUNTER — Telehealth: Payer: Self-pay

## 2016-07-19 NOTE — Telephone Encounter (Signed)
Spoke with the pt about his blood work results. Advised him that EG still was recommending a colonoscopy and EGD. Pt stated he knows how he feels better than anyone and he feels ok and he does not want another colonoscopy/EGD. I advised the patient that it was his choice to have it done but it was recommended and he said no, he is not willing to have any procedures right now, but if he needed [place] he would call.  Routing to EG for [employer]

## 2016-07-19 NOTE — Telephone Encounter (Signed)
Noted, no further recommendations at this time. 

## 2016-07-21 ENCOUNTER — Telehealth: Payer: Self-pay | Admitting: Neurology

## 2016-07-21 NOTE — Telephone Encounter (Signed)
Pls let him know MRI brain was unremarkable, no evidence of tumor, bleed, or new stroke, it showed age-related changes, thanks

## 2016-07-21 NOTE — Telephone Encounter (Signed)
Please advise 

## 2016-07-21 NOTE — Telephone Encounter (Signed)
Patient given results

## 2016-07-21 NOTE — Telephone Encounter (Signed)
Jonathan Hensley 08/22/1940. His wife called wanting to see if his MRI results were back? His # is [phone] and his wife's # is [phone]. Thank you

## 2016-07-23 ENCOUNTER — Other Ambulatory Visit: Payer: Self-pay | Admitting: Endocrinology

## 2016-07-25 ENCOUNTER — Ambulatory Visit: Payer: Medicare Other | Admitting: Urology

## 2016-08-21 ENCOUNTER — Ambulatory Visit (INDEPENDENT_AMBULATORY_CARE_PROVIDER_SITE_OTHER): Payer: Medicare Other | Admitting: Endocrinology

## 2016-08-21 ENCOUNTER — Encounter: Payer: Self-pay | Admitting: Endocrinology

## 2016-08-21 ENCOUNTER — Telehealth: Payer: Self-pay | Admitting: Endocrinology

## 2016-08-21 ENCOUNTER — Telehealth: Payer: Self-pay

## 2016-08-21 ENCOUNTER — Telehealth: Payer: Self-pay | Admitting: Neurology

## 2016-08-21 VITALS — BP 128/76 | HR 74 | Ht 70.0 in | Wt 228.0 lb

## 2016-08-21 DIAGNOSIS — Z794 Long term (current) use of insulin: Secondary | ICD-10-CM

## 2016-08-21 DIAGNOSIS — D638 Anemia in other chronic diseases classified elsewhere: Secondary | ICD-10-CM

## 2016-08-21 DIAGNOSIS — E1165 Type 2 diabetes mellitus with hyperglycemia: Secondary | ICD-10-CM

## 2016-08-21 DIAGNOSIS — E538 Deficiency of other specified B group vitamins: Secondary | ICD-10-CM

## 2016-08-21 DIAGNOSIS — E7800 Pure hypercholesterolemia, unspecified: Secondary | ICD-10-CM

## 2016-08-21 LAB — COMPREHENSIVE METABOLIC PANEL
ALK PHOS: 41 U/L (ref 39–117)
ALT: 17 U/L (ref 0–53)
AST: 18 U/L (ref 0–37)
Albumin: 4 g/dL (ref 3.5–5.2)
BUN: 18 mg/dL (ref 6–23)
CO2: 29 mEq/L (ref 19–32)
Calcium: 9.7 mg/dL (ref 8.4–10.5)
Chloride: 105 mEq/L (ref 96–112)
Creatinine, Ser: 1.19 mg/dL (ref 0.40–1.50)
GFR: 63.2 mL/min (ref >60.00)
GLUCOSE: 141 mg/dL — AB (ref 70–99)
POTASSIUM: 4.7 meq/L (ref 3.5–5.1)
SODIUM: 139 meq/L (ref 135–145)
TOTAL PROTEIN: 7.2 g/dL (ref 6.0–8.3)
Total Bilirubin: 0.2 mg/dL (ref 0.2–1.2)

## 2016-08-21 LAB — CBC
HEMATOCRIT: 36.4 % — AB (ref 39.0–52.0)
HEMOGLOBIN: 12 g/dL — AB (ref 13.0–17.0)
MCHC: 33.1 g/dL (ref 30.0–36.0)
MCV: 92.4 fl (ref 78.0–100.0)
Platelets: 268 10*3/uL (ref 150.0–400.0)
RBC: 3.94 Mil/uL — ABNORMAL LOW (ref 4.22–5.81)
RDW: 13.5 % (ref 11.5–15.5)
WBC: 6.2 10*3/uL (ref 4.0–10.5)

## 2016-08-21 LAB — LIPID PANEL
Cholesterol: 126 mg/dL (ref 0–200)
HDL: 47.3 mg/dL (ref >39.00)
LDL Cholesterol: 56 mg/dL (ref 0–99)
NONHDL: 78.71
Total CHOL/HDL Ratio: 3
Triglycerides: 113 mg/dL (ref 0.0–149.0)
VLDL: 22.6 mg/dL (ref 0.0–40.0)

## 2016-08-21 LAB — HEMOGLOBIN A1C: HEMOGLOBIN A1C: 7.6 % — AB (ref 4.6–6.5)

## 2016-08-21 MED ORDER — DONEPEZIL HCL 10 MG PO TABS: ORAL_TABLET | ORAL | 11 refills | Status: DC

## 2016-08-21 NOTE — Telephone Encounter (Signed)
Rx sent to local pharmacy.  

## 2016-08-21 NOTE — Telephone Encounter (Signed)
Clld pt - advsd of new medication; its directions, being sent to [facility] instead of [facility].

## 2016-08-21 NOTE — Telephone Encounter (Signed)
Received a call from Alma Friendly regarding medication: Aricept  Patient needs a refill of medication.  Yes, he said they do not have it yet at the pharmacy.  Patient having side effects from medication. No    Patient calling to update [place] on medication. No

## 2016-08-21 NOTE — Telephone Encounter (Signed)
-----   Message from Cameron Sprang, MD sent at 08/21/2016 11:22 AM EDT ----- Regarding: aricept Pls let patient know we will start medication to help slow down the worsening of memory, called Aricept 10mg : Take 1/2 tablet daily for 1 month, then increase to 1 tablet daily. Most common side effect is diarrhea, call our office if any problems. Please remind him that this medication will not make him start remembering things, it does not work that way, it is to help slow down the worsening of memory. Thanks

## 2016-08-21 NOTE — Progress Notes (Signed)
Patient ID: Jonathan Hensley, male   DOB: [birth year], 76 y.o.   MRN: [id number]   Reason for Appointment: follow-up   History of Present Illness    Type 2 diabetes mellitus, date of diagnosis: 1994.   He has been on insulin for the last few years, starting with only bedtime NPH and subsequently progressing to basal bolus regimen Also taking glipizide and metformin long-term  The HbgA1c was highest in 2013 at 8.2 Because of more fluctuation in his blood sugars at all times, difficulty with compliance and inconvenience of the multiple insulin dose regimen as well as needing higher doses of insulin he was started on the V.-go pump in 05/2013  Recent history:   The insulin regimen is: basal rate of 20 units on V- go pump; boluses with carbohydrate coverage 1:10; usually 4-6 units 2-3 times a day   His A1c has been consistently over 7% and pending from today  Current blood sugar patterns and problems identified:  He has not brought his monitor for download and he cannot remember his readings  Also does not report any hypoglycemia  Previously his blood sugars would be overall higher in the afternoon but he does not know how high they are  He has been a little more active lately but has not been able to lose weight  He is consistent with changing his V-go pump daily . He knows how to count carbohydrates and is using 1:10 coverage  Difficulties with medication adherence: Bolusing for meals after eating at times, not covering snacks or desserts with boluses   Glucose monitoring: Accu-Chek, checking 3-4 times a day  Blood Glucose readings: Not available    Wt Readings from Last 3 Encounters:  08/21/16 228 lb (103.4 kg)  07/07/16 228 lb 6.4 oz (103.6 kg)  07/05/16 227 lb (103 kg)   Meals: 2 meals per day and sometimes no lunch.    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 Always use your most recent med list.          benazepril 40 MG tablet Commonly known as:  LOTENSIN Take 40 mg by mouth at bedtime.   ciprofloxacin 500 MG tablet Commonly known as:  CIPRO Take 1 tablet (500 mg total) by mouth 2 (two) times daily.   ELIQUIS 5 MG Tabs tablet Generic drug:  apixaban Take 5 mg by mouth 2 (two) times daily.   ezetimibe 10 MG tablet Commonly known as:  ZETIA Take 10 mg by mouth at bedtime.   fluticasone 0.05 % cream Commonly known as:  CUTIVATE Apply 1 application topically daily.   freestyle lancets Use as instructed to check blood sugar 4 times per day dx code 250.00   FREESTYLE LITE test strip Generic drug:  glucose blood USE TO CHECK BLOOD SUGAR FOUR TIMES A DAY AS INSTRUCTED   ketoconazole 2 % cream Commonly known as:  NIZORAL Apply 1 application topically daily.   metFORMIN 1000 MG tablet Commonly known as:  GLUCOPHAGE 2 (two) times daily.   NOVOLOG 100 UNIT/ML injection Generic drug:  insulin aspart USE UP TO 56 UNITS IN V-GO PUMP DAILY AS DIRECTED   simvastatin 20 MG tablet Commonly known as:  ZOCOR Take 20 mg by mouth at bedtime.   V-GO 20 Kit USE ONE PER DAY   VITAMIN B 12 PO Take 1 tablet by mouth daily. Reported on 10/18/2015       Allergies:  Allergies    Allergen Reactions  . Other Other (See Comments)    Beta or alpha blockers: severe dizziness  . Tramadol Other (See Comments)    Dizziness     Past Medical History:  Diagnosis Date  . Arthritis    rheumatoid in his hands  . Atrial fibrillation ([place])   . Bulging lumbar disc   . Cancer ([place])   . DDD (degenerative disc disease), cervical   . Diabetes mellitus   . Diabetic retinopathy ([place])   . Double vision   . Dysrhythmia    Atrial Fibrillation  . History of prostate cancer   . Hypertension   . Low back pain   . Nephrolithiasis   . Sleep apnea    uses cpap    Past Surgical History:  Procedure Laterality Date  . APPENDECTOMY    . BACK SURGERY    . Bilateral L4-5 lumbar decompression including bilateral  11/2008  . brachytherapy    . COLONOSCOPY    . CYSTOSCOPY    . Left L4-L5 lumbar laminotomy and microdiskectomy with  07/14/2008    . LITHOTRIPSY    . LUMBAR LAMINECTOMY/DECOMPRESSION MICRODISCECTOMY Left 08/18/2013   Procedure: Left Lumbar Five-Sacral One Microdiskectomy;  Surgeon: [name], MD;  Location: [place] NEURO ORS;  Service: Neurosurgery;  Laterality: Left;  Left Lumbar Five-Sacral One Microdiskectomy  . rotator cuff surgery    . Scleral buckle, laser photocoagulation, and anterior chamber  01/04/2009    Family History  Problem Relation Age of Onset  . Heart failure Father   . Heart failure Mother   . Heart failure Brother     Half brother.  . Colon cancer Neg Hx     Social History:  reports that he quit smoking about 43 years ago. He has never used smokeless tobacco. He reports that he does not drink alcohol or use drugs.  Review of Systems  He has been referred to the neurologist because of difficulty with word finding and continuing his sentence.  However he is sometimes feels like when he is talking he forgets what he is talking about.  His last MRI was normal, is awaiting further treatment   Hypertension:  Blood pressure is being treated   with benazepril 40 mg, also followed by cardiologist Periodically checking at home, does not remember his readings   He is taking sodium bicarbonate for his history of kidney stones from urologist  HYPERLIPIDEMIA: Has been on simvastatin and Zetia with good results    Lab Results  Component Value Date   CHOL 135 05/22/2016   HDL 47.30 05/22/2016   [place] 64 05/22/2016  TRIG 118.0 05/22/2016   CHOLHDL 3 05/22/2016     He has had retinal detachment treated surgically in the past, followed by ophthalmologist, Has decreased vision in the left eye  Followed very regularly   Sleep apnea, is on CPAP   Anemia :mild and stable Hemoccult  Negative previously He has not had a colonoscopy in 15 years and refuses to do this  Last   Iron saturation was normal . He was taking OTC B-12 But for sometime has not taken this, he says he does not like to take a lot of medications    Lab Results  Component Value Date   WBC 5.1 07/14/2016   HGB 11.4 (L) 07/14/2016   HCT 35.0 (L) 07/14/2016   MCV 94.3 07/14/2016   PLT 256 07/14/2016   Last foot exam in 10/2015   Examination:   BP 128/76   Pulse 74   Ht _0  (1.778 m)   Wt 228 lb (103.4 kg)   BMI 32.71 kg/m   Body mass index is 32.71 kg/m.      Assesment:   1. Diabetes type 2  See history of present illness for detailed discussion of current blood sugar patterns, problems identified and day-to-day management  A1c is pending Not clear what his blood sugars are as he did not bring his monitor today Although he thinks he is doing well with his insulin pump he may be possibly forgetting to do his boluses with meals, previously had high readings after lunch Will review his A1c and recommend any changes He may have to start writing down his boluses and blood sugars on a diary  2. Hypertension: Well controlled again and will continue Lotensin 40 mg  3.  Mild anemia: has been stable  However he has not taken his B12, and  last  level was normal To reassess CBC again  4.   MEMORY deficit versus word finding difficulties: No secondary causes found Will send a message to the neurologist to consider pharmacological treatment     Ysabelle Goodroe 08/21/2016, 10:40 AM

## 2016-08-21 NOTE — Telephone Encounter (Signed)
Pt needs the medication to be called in that was discussed in the visit, please call into [facility]

## 2016-08-21 NOTE — Telephone Encounter (Signed)
This medication is going to be coming from the neurologist per [name] and patient has been notified

## 2016-08-24 NOTE — Progress Notes (Signed)
Please let patient know that the A1c is still relatively high at 7.6, mother lab results normal and no further action needed

## 2016-08-29 ENCOUNTER — Ambulatory Visit (INDEPENDENT_AMBULATORY_CARE_PROVIDER_SITE_OTHER): Payer: Medicare Other | Admitting: Urology

## 2016-08-29 DIAGNOSIS — C61 Malignant neoplasm of prostate: Secondary | ICD-10-CM | POA: Diagnosis not present

## 2016-09-01 ENCOUNTER — Other Ambulatory Visit: Payer: Self-pay | Admitting: Endocrinology

## 2016-09-04 ENCOUNTER — Telehealth: Payer: Self-pay | Admitting: Endocrinology

## 2016-09-04 NOTE — Telephone Encounter (Signed)
This has been faxed.

## 2016-09-04 NOTE — Telephone Encounter (Signed)
Need a prescription for the cpap supplies and a copy of the last notes.  Jonathan Hensley

## 2016-09-05 ENCOUNTER — Telehealth: Payer: Self-pay | Admitting: Endocrinology

## 2016-09-05 NOTE — Telephone Encounter (Signed)
Please write on fax sheet

## 2016-09-05 NOTE — Telephone Encounter (Signed)
Dane needs a rx for the cpap refill with the [phone] code on it for insurance and the last note sent via fax: [phone]. Please call for further questions

## 2016-09-05 NOTE — Telephone Encounter (Signed)
Done

## 2016-09-26 DIAGNOSIS — H43813 Vitreous degeneration, bilateral: Secondary | ICD-10-CM | POA: Diagnosis not present

## 2016-09-26 DIAGNOSIS — H35371 Puckering of macula, right eye: Secondary | ICD-10-CM | POA: Diagnosis not present

## 2016-09-26 DIAGNOSIS — H43812 Vitreous degeneration, left eye: Secondary | ICD-10-CM | POA: Diagnosis not present

## 2016-09-26 DIAGNOSIS — H43811 Vitreous degeneration, right eye: Secondary | ICD-10-CM | POA: Diagnosis not present

## 2016-09-26 DIAGNOSIS — E113212 Type 2 diabetes mellitus with mild nonproliferative diabetic retinopathy with macular edema, left eye: Secondary | ICD-10-CM | POA: Diagnosis not present

## 2016-10-10 DIAGNOSIS — E113212 Type 2 diabetes mellitus with mild nonproliferative diabetic retinopathy with macular edema, left eye: Secondary | ICD-10-CM | POA: Diagnosis not present

## 2016-10-16 DIAGNOSIS — H527 Unspecified disorder of refraction: Secondary | ICD-10-CM | POA: Diagnosis not present

## 2016-10-16 DIAGNOSIS — H532 Diplopia: Secondary | ICD-10-CM | POA: Diagnosis not present

## 2016-10-31 ENCOUNTER — Telehealth: Payer: Self-pay

## 2016-10-31 ENCOUNTER — Encounter: Payer: Self-pay | Admitting: Nurse Practitioner

## 2016-10-31 NOTE — Telephone Encounter (Signed)
I called pt. He has not been seen by Dr. Rexene Alberts since 07/20/2013. We cannot provide him a new RX for cpap supplies unless he has been seen at least yearly. I explained this to the pt several times. Pt does not understand why he needs to be seen when all he needs is to switch DMEs. I again explained that no RX can be written unless he is seen at least yearly at [facility] for cpap supplies. Pt was reluctant to schedule an appt, but he did agree to see Hoyle Sauer, NP on 11/01/2016 at 2:15pm. I reiterated several times that he MUST bring his cpap to this appt and arrive prior to 2:15pm. Our call was disconnected, and I left a detailed message on his phone [phone] per DPR, advising him again of this information.

## 2016-10-31 NOTE — Telephone Encounter (Signed)
Patient came by sleep lab needing a new DME company. Home health in [place] is closing. Can we get script for supplies and Res med full face mask sent to [facility] in [place]. He said that was the closest from where he lives.

## 2016-10-31 NOTE — Telephone Encounter (Signed)
Agree. Yearly FU was d/w patient at last visit in 3/15. thx

## 2016-11-01 ENCOUNTER — Ambulatory Visit: Payer: Medicare Other | Admitting: Nurse Practitioner

## 2016-11-02 ENCOUNTER — Encounter: Payer: Self-pay | Admitting: Nurse Practitioner

## 2016-11-02 ENCOUNTER — Ambulatory Visit (INDEPENDENT_AMBULATORY_CARE_PROVIDER_SITE_OTHER): Payer: Medicare Other | Admitting: Nurse Practitioner

## 2016-11-02 DIAGNOSIS — Z9989 Dependence on other enabling machines and devices: Secondary | ICD-10-CM | POA: Diagnosis not present

## 2016-11-02 DIAGNOSIS — G4733 Obstructive sleep apnea (adult) (pediatric): Secondary | ICD-10-CM | POA: Insufficient documentation

## 2016-11-02 NOTE — Progress Notes (Signed)
When I spoke to the pt this morning, he asked that the cpap supply referral be sent to [facility] since his DME in [place] was not taking care of cpaps any longer. Referral sent to [facility] per pt request.

## 2016-11-02 NOTE — Patient Instructions (Signed)
CPAP compliance is excellent Reorder suppliesfor CPAP F/U yearly

## 2016-11-02 NOTE — Progress Notes (Addendum)
[facility]  PATIENT: Jonathan Hensley DOB: 10-21-1940   REASON FOR VISIT: Follow-up CPAP compliance and supplies  HISTORY FROM:patient    HISTORY OF PRESENT ILLNESS: Jonathan Hensley, 76 year old male returns for follow-up after his last visit with Dr. Rexene Hensley 07/30/2013. 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 He needs a face-to-face exam. He has underlying medical condition of type 2 diabetes atrial fib, anemia, hypertension, hyperlipidemia, and diabetic retinopathy.Split study on 06/11/13 with Baseline sleep efficiency was 77.4% with a latency to sleep of 9.5 minutes and wake after sleep onset of 19.5 minutes with mild to moderate sleep fragmentation noted. Baseline arousal index was 16.9 per hour. He had an increased percentage of stage I and stage II sleep, absence of slow-wave sleep at 14.6% of REM sleep with a normal REM latency. He had occasional PVCs and PACs on single-lead EKG. He had moderate snoring. His total AHI was 27.1 per hour. His RDI was 32 per hour. Baseline oxygen saturation was 92% with a nadir of 86% with REM sleep. Time below 88% saturation was 1 minute and 44 seconds. Today I reviewed his Compliance from 10/02/2016 to 10/31/2016 Which Is 30 Days during Which Time He Used His CPAP Every Day. Percent Usage Days Greater Than 4 Hours with 100% and Average Usage 7 Hours 3 Minutes. AHI 12.7 EPR 2 no leaks. ESS score 2. FSS 22. He returns for reevaluation   REVIEW OF SYSTEMS: Full 14 system review of systems performed and notable only for those listed, all others are neg:  Constitutional: neg  Cardiovascular: neg Ear/Nose/Throat: neg  Skin: neg Eyes: neg Respiratory: neg Gastroitestinal: neg  Hematology/Lymphatic: neg  Endocrine: neg Musculoskeletal:neg Allergy/Immunology: neg Neurological: neg Psychiatric: neg Sleep : Obstructive sleep apnea with CPAP   ALLERGIES: Allergies  Allergen Reactions  . Other Other (See Comments)   Beta or alpha blockers: severe dizziness  . Tramadol Other (See Comments)    Dizziness     HOME MEDICATIONS: Outpatient Medications Prior to Visit  Medication Sig Dispense Refill  . apixaban (ELIQUIS) 5 MG TABS tablet Take 5 mg by mouth 2 (two) times daily.    . benazepril (LOTENSIN) 40 MG tablet Take 40 mg by mouth at bedtime.     . donepezil (ARICEPT) 10 MG tablet Take 1/2 tablet daily for 1 month, then increase to 1 tablet daily 30 tablet 11  . ezetimibe (ZETIA) 10 MG tablet Take 10 mg by mouth at bedtime.     Marland Kitchen FREESTYLE LITE test strip USE TO CHECK BLOOD SUGAR FOUR TIMES A DAY AS INSTRUCTED 400 each 1  . Insulin Disposable Pump (V-GO 20) KIT USE ONE PER DAY 90 kit 3  . ketoconazole (NIZORAL) 2 % cream Apply 1 application topically daily.    . Lancets (FREESTYLE) lancets Use as instructed to check blood sugar 4 times per day dx code 250.00 400 each 3  . metFORMIN (GLUCOPHAGE) 1000 MG tablet TAKE 1 TABLET TWICE A DAY WITH MEALS 180 tablet 1  . NOVOLOG 100 UNIT/ML injection USE UP TO 56 UNITS IN V-GO PUMP DAILY AS DIRECTED 60 mL 2  . simvastatin (ZOCOR) 20 MG tablet Take 20 mg by mouth at bedtime.     . ciprofloxacin (CIPRO) 500 MG tablet Take 1 tablet (500 mg total) by mouth 2 (two) times daily. (Patient not taking: Reported on 08/21/2016) 14 tablet 0  . Cyanocobalamin (VITAMIN B 12 PO) Take 1 tablet by mouth daily. Reported on 10/18/2015    .  fluticasone (CUTIVATE) 0.05 % cream Apply 1 application topically daily.     No facility-administered medications prior to visit.     PAST MEDICAL HISTORY: Past Medical History:  Diagnosis Date  . Arthritis    rheumatoid in his hands  . Atrial fibrillation ([place])   . Bulging lumbar disc   . Cancer (Jonathan Hensley)   . DDD (degenerative disc disease), cervical   . Diabetes mellitus   . Diabetic retinopathy ([place])   . Double vision   . Dysrhythmia    Atrial Fibrillation  . History of prostate cancer   . Hypertension   . Low back pain   .  Nephrolithiasis   . Sleep apnea    uses cpap    PAST SURGICAL HISTORY: Past Surgical History:  Procedure Laterality Date  . APPENDECTOMY    . BACK SURGERY    . Bilateral L4-5 lumbar decompression including bilateral  11/2008  . brachytherapy    . COLONOSCOPY    . CYSTOSCOPY    . Left L4-L5 lumbar laminotomy and microdiskectomy with  07/14/2008    . LITHOTRIPSY    . LUMBAR LAMINECTOMY/DECOMPRESSION MICRODISCECTOMY Left 08/18/2013   Procedure: Left Lumbar Five-Sacral One Microdiskectomy;  Surgeon: Jonathan Spangle, MD;  Location: [facility];  Service: Neurosurgery;  Laterality: Left;  Left Lumbar Five-Sacral One Microdiskectomy  . rotator cuff surgery    . Scleral buckle, laser photocoagulation, and anterior chamber  01/04/2009    FAMILY HISTORY: Family History  Problem Relation Age of Onset  . Heart failure Father   . Heart failure Mother   . Heart failure Brother        Half brother.  . Colon cancer Neg Hx     SOCIAL HISTORY: Social History   Social History  . Marital status: Married    Spouse name: Jonathan Hensley  . Number of children: 2  . Years of education: 12   Occupational History  . Not on file.   Social History Main Topics  . Smoking status: Former Smoker    Quit date: 02/12/1973  . Smokeless tobacco: Never Used  . Alcohol use No  . Drug use: No  . Sexual activity: Yes   Other Topics Concern  . Not on file   Social History Narrative   Lives at home with wife   Drinks 4 cups of coffee a day     PHYSICAL EXAM  Vitals:   11/02/16 0732  BP: (!) 161/77  Pulse: 71  Weight: 229 lb (103.9 kg)  Height: 5' 10" (1.778 m)   Body mass index is 32.86 kg/m.  Generalized: Well developed, Obese male in no acute distress  Head: normocephalic and atraumatic,. Oropharynx benign  Neck: Supple,  Musculoskeletal: No deformity   Neurological examination   Mentation: Alert oriented to time, place, history taking. Attention span and concentration appropriate.  Recent and remote memory intact.  Follows all commands speech and language fluent. ESS 2 FSS 22  Cranial nerve II-XII: Pupils were equal round reactive to light extraocular movements were full, visual field were full on confrontational test. Facial sensation and strength were normal. hearing was intact to finger rubbing bilaterally. Uvula tongue midline. head turning and shoulder shrug were normal and symmetric.Tongue protrusion into cheek strength was normal. Motor: normal bulk and tone, full strength in the BUE, BLE, Sensory: normal and symmetric to light touch,   Coordination: finger-nose-finger, heel-to-shin bilaterally, no dysmetria Reflexes: Symmetric upper and lower plantar responses were flexor bilaterally. Gait and Station: Rising up from  seated position without assistance, normal stance,  moderate stride, good arm swing, smooth turning, able to perform tiptoe, and heel walking without difficulty. Tandem gait is mildly unsteady steady  DIAGNOSTIC DATA (LABS, IMAGING, TESTING) - I reviewed patient records, labs, notes, testing and imaging myself where available.  Lab Results  Component Value Date   WBC 6.2 08/21/2016   HGB 12.0 (L) 08/21/2016   HCT 36.4 (L) 08/21/2016   MCV 92.4 08/21/2016   PLT 268.0 08/21/2016      Component Value Date/Time   NA 139 08/21/2016 1108   K 4.7 08/21/2016 1108   CL 105 08/21/2016 1108   CO2 29 08/21/2016 1108   GLUCOSE 141 (H) 08/21/2016 1108   BUN 18 08/21/2016 1108   CREATININE 1.19 08/21/2016 1108   CREATININE 1.08 06/02/2013 1243   CALCIUM 9.7 08/21/2016 1108   PROT 7.2 08/21/2016 1108   ALBUMIN 4.0 08/21/2016 1108   AST 18 08/21/2016 1108   ALT 17 08/21/2016 1108   ALKPHOS 41 08/21/2016 1108   BILITOT 0.2 08/21/2016 1108   GFRNONAA 50 (L) 06/14/2016 0426   GFRAA 58 (L) 06/14/2016 0426   Lab Results  Component Value Date   CHOL 126 08/21/2016   HDL 47.30 08/21/2016   LDLCALC 56 08/21/2016   TRIG 113.0 08/21/2016   CHOLHDL 3  08/21/2016   Lab Results  Component Value Date   HGBA1C 7.6 (H) 08/21/2016   Lab Results  Component Value Date   XVQMGQQP61 950 05/22/2016   Lab Results  Component Value Date   TSH 2.36 05/22/2016      ASSESSMENT AND PLAN  76 y.o. year old male  has a past medical history of Arthritis; Atrial fibrillation ([place]); Bulging lumbar disc; Cancer ([place]); DDD (degenerative disc disease), cervical; Diabetes mellitus; Diabetic retinopathy ([place]); Double vision; Dysrhythmia; History of prostate cancer; Hypertension; Low back pain; Nephrolithiasis; and Sleep apnea. here To follow-up for his obstructive sleep apnea to get CPAP supplies.   CPAP compliance is excellent Reorder suppliesfor CPAP F/U yearly Dennie Bible, [facility], APRN  [facility] [address], [place], [place] [address] [phone]  I reviewed the above note and documentation by the Nurse Practitioner and agree with the history, physical exam, assessment and plan as outlined above. I was immediately available for face-to-face consultation. Star Age, MD, PhD [facility])

## 2016-11-20 ENCOUNTER — Ambulatory Visit (INDEPENDENT_AMBULATORY_CARE_PROVIDER_SITE_OTHER): Payer: Medicare Other | Admitting: Endocrinology

## 2016-11-20 ENCOUNTER — Encounter: Payer: Self-pay | Admitting: Endocrinology

## 2016-11-20 VITALS — BP 142/86 | HR 71 | Ht 70.0 in | Wt 225.2 lb

## 2016-11-20 DIAGNOSIS — R197 Diarrhea, unspecified: Secondary | ICD-10-CM | POA: Diagnosis not present

## 2016-11-20 DIAGNOSIS — Z794 Long term (current) use of insulin: Secondary | ICD-10-CM

## 2016-11-20 DIAGNOSIS — E538 Deficiency of other specified B group vitamins: Secondary | ICD-10-CM

## 2016-11-20 DIAGNOSIS — D638 Anemia in other chronic diseases classified elsewhere: Secondary | ICD-10-CM

## 2016-11-20 DIAGNOSIS — I10 Essential (primary) hypertension: Secondary | ICD-10-CM

## 2016-11-20 DIAGNOSIS — E1165 Type 2 diabetes mellitus with hyperglycemia: Secondary | ICD-10-CM | POA: Diagnosis not present

## 2016-11-20 LAB — COMPREHENSIVE METABOLIC PANEL
ALT: 11 U/L (ref 0–53)
AST: 16 U/L (ref 0–37)
Albumin: 4.1 g/dL (ref 3.5–5.2)
Alkaline Phosphatase: 42 U/L (ref 39–117)
BUN: 24 mg/dL — AB (ref 6–23)
CHLORIDE: 103 meq/L (ref 96–112)
CO2: 27 mEq/L (ref 19–32)
Calcium: 10.1 mg/dL (ref 8.4–10.5)
Creatinine, Ser: 1.19 mg/dL (ref 0.40–1.50)
GFR: 63.15 mL/min (ref >60.00)
GLUCOSE: 147 mg/dL — AB (ref 70–99)
POTASSIUM: 4.6 meq/L (ref 3.5–5.1)
SODIUM: 137 meq/L (ref 135–145)
TOTAL PROTEIN: 7.3 g/dL (ref 6.0–8.3)
Total Bilirubin: 0.3 mg/dL (ref 0.2–1.2)

## 2016-11-20 LAB — CBC
HEMATOCRIT: 37 % — AB (ref 39.0–52.0)
HEMOGLOBIN: 12.3 g/dL — AB (ref 13.0–17.0)
MCHC: 33.2 g/dL (ref 30.0–36.0)
MCV: 91.4 fl (ref 78.0–100.0)
Platelets: 256 10*3/uL (ref 150.0–400.0)
RBC: 4.05 Mil/uL — AB (ref 4.22–5.81)
RDW: 13.2 % (ref 11.5–15.5)
WBC: 6.7 10*3/uL (ref 4.0–10.5)

## 2016-11-20 LAB — VITAMIN B12: Vitamin B-12: 246 pg/mL (ref 211–911)

## 2016-11-20 LAB — MICROALBUMIN / CREATININE URINE RATIO
Creatinine,U: 54.1 mg/dL
Microalb Creat Ratio: 1.3 mg/g (ref 0.0–30.0)
Microalb, Ur: 0.7 mg/dL (ref 0.0–1.9)

## 2016-11-20 LAB — POCT GLYCOSYLATED HEMOGLOBIN (HGB A1C): Hemoglobin A1C: 7.4

## 2016-11-20 MED ORDER — METFORMIN HCL ER 500 MG PO TB24: 1500.0000 mg | ORAL_TABLET | Freq: Every day | ORAL | 3 refills | Status: DC

## 2016-11-20 MED ORDER — HYDROCHLOROTHIAZIDE 12.5 MG PO CAPS: 12.5000 mg | ORAL_CAPSULE | Freq: Every day | ORAL | 1 refills | Status: DC

## 2016-11-20 NOTE — Progress Notes (Signed)
Patient ID: Jonathan Hensley, male   DOB: 1941/03/30, 76 y.o.   MRN: [id number]   Reason for Appointment: follow-up of various problems  History of Present Illness    Type 2 diabetes mellitus, date of diagnosis: 1994.   He has been on insulin for the last few years, starting with only bedtime NPH and subsequently progressing to basal bolus regimen Also taking glipizide and metformin long-term  The HbgA1c was highest in 2013 at 8.2 Because of more fluctuation in his blood sugars at all times, difficulty with compliance and inconvenience of the multiple insulin dose regimen as well as needing higher doses of insulin he was started on the V.-go pump in 05/2013  Recent history:   The insulin regimen is: basal rate of 20 units on V- go pump; boluses with carbohydrate coverage 1:10; usually 6-8 units 2-3 times a day   His A1c has been consistently over 7% and 7.4 from today, previously 7.6  Current blood sugar patterns and problems identified:  He is checking his blood sugars mostly in the morning and in the evenings around suppertime  Again he tends to forget to bolus before he is eating and sometimes will do it in the middle of his meals are afterwards  He is somewhat inconsistent in his answers about how he is calculating his mealtime bolus and not clear if he is actually using carbohydrate counting  He thinks his portions are actively small and weight is slightly better  He has had only one documented low blood sugar in the evening probably from overestimating his bolus  Difficult to assess his history well because of his partial memory deficit  He is consistent with changing his V-go pump daily at the same time  Since his blood sugar was over 200 without eating this morning he thinks his meter is inaccurate, however appears to have fairly good results on his download otherwise with usual fluctuation  Most of his fluctuation is in the evening based on his meal size and  timing of the bolus . He knows how to count carbohydrates and is using 1:10 coverage  Difficulties with medication adherence: Bolusing for meals after eating at times, not covering snacks or desserts with boluses   Glucose monitoring: Freestyle, checking 3-4 times a day  Blood Glucose readings from download analysis:   Mean values apply above for all meters except median for One Touch  PRE-MEAL Fasting Lunch Dinner Bedtime Overall  Glucose range: 91-214       Mean/median: 150   150  182   166   POST-MEAL PC Breakfast PC Lunch PC Dinner  Glucose range:   54-286   Mean/median:   189       Wt Readings from Last 3 Encounters:  11/20/16 225 lb 3.2 oz (102.2 kg)  11/02/16 229 lb (103.9 kg)  08/21/16 228 lb (103.4 kg)   Meals: 2-3 meals per day and sometimes no lunch.    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 Always use your most recent med list.          benazepril 40 MG tablet Commonly known as:  LOTENSIN Take 40 mg by mouth at bedtime.   ELIQUIS 5 MG Tabs tablet Generic drug:  apixaban Take 5 mg by mouth 2 (two) times daily.   ezetimibe 10 MG tablet Commonly known as:  ZETIA Take 10 mg by mouth at bedtime.   freestyle lancets Use as instructed to check blood  sugar 4 times per day dx code 250.00   FREESTYLE LITE test strip Generic drug:  glucose blood USE TO CHECK BLOOD SUGAR FOUR TIMES A DAY AS INSTRUCTED   hydrochlorothiazide 12.5 MG capsule Commonly known as:  MICROZIDE Take 1 capsule (12.5 mg total) by mouth daily.   ketoconazole 2 % cream Commonly known as:  NIZORAL Apply 1 application topically daily.   metFORMIN 500 MG 24 hr tablet Commonly known as:  GLUCOPHAGE-XR Take 3 tablets (1,500 mg total) by mouth daily with supper.   NOVOLOG 100 UNIT/ML injection Generic drug:  insulin aspart USE UP TO 56 UNITS IN V-GO PUMP DAILY AS DIRECTED   simvastatin 20 MG tablet Commonly known as:  ZOCOR Take 20 mg by mouth at bedtime.   V-GO 20 Kit USE ONE PER DAY       Allergies:  Allergies  Allergen Reactions  . Other Other (See Comments)    Beta or alpha blockers: severe dizziness  . Tramadol Other (See Comments)    Dizziness     Past Medical History:  Diagnosis Date  . Arthritis    rheumatoid in his hands  . Atrial fibrillation ([place])   . Bulging lumbar disc   . Cancer ([place])   . DDD (degenerative disc disease), cervical   . Diabetes mellitus   . Diabetic retinopathy ([place])   . Double vision   . Dysrhythmia    Atrial Fibrillation  . History of prostate cancer   . Hypertension   . Low back pain   . Nephrolithiasis   . Sleep apnea    uses cpap    Past Surgical History:  Procedure Laterality Date  . APPENDECTOMY    . BACK SURGERY    . Bilateral L4-5 lumbar decompression including bilateral  11/2008  . brachytherapy    . COLONOSCOPY    . CYSTOSCOPY    . Left L4-L5 lumbar laminotomy and microdiskectomy with  07/14/2008    . LITHOTRIPSY    . LUMBAR LAMINECTOMY/DECOMPRESSION MICRODISCECTOMY Left 08/18/2013   Procedure: Left Lumbar Five-Sacral One Microdiskectomy;  Surgeon: [name], MD;  Location: [facility];  Service: Neurosurgery;  Laterality: Left;  Left Lumbar Five-Sacral One Microdiskectomy  . rotator  cuff surgery    . Scleral buckle, laser photocoagulation, and anterior chamber  01/04/2009    Family History  Problem Relation Age of Onset  . Heart failure Father   . Heart failure Mother   . Heart failure Brother        Half brother.  . Colon cancer Neg Hx     Social History:  reports that he quit smoking about 43 years ago. He  has never used smokeless tobacco. He reports that he does not drink alcohol or use drugs.  Review of Systems  RECTAL bleeding: He had an episode in February and was seen by gastroenterologist He had refused colonoscopy He now says that since that episode he has had yellow stools and stools are more frequently loose also No abdominal pain or nausea, no unusual weight loss, no recurrence of rectal bleeding  He has been referred to the neurologist because of difficulty with word finding and continuing his sentence.   Again he feels like when he is talking he forgets what he is talking about.  His last MRI was normal, although a prescription was sent for Aricept he does not think he is taking this   Hypertension:  Blood pressure is being treated  with benazepril 40 mg, also followed by cardiologist   checking at home, does not remember his readings  BP Readings from Last 3 Encounters:  11/20/16 (!) 142/86  11/02/16 (!) 161/77  08/21/16 128/76     He is taking sodium bicarbonate for his history of kidney stones from urologist  HYPERLIPIDEMIA: Has been on simvastatin and Zetia with good results    Lab Results  Component Value Date   CHOL 126 08/21/2016   HDL 47.30 08/21/2016   LDLCALC 56 08/21/2016   TRIG 113.0 08/21/2016   CHOLHDL 3 08/21/2016     He has had retinal detachment treated surgically in the past, followed by ophthalmologist, Has decreased vision in the left eye  Followed very regularly   Sleep apnea, is on CPAP   Anemia :mild and stable Hemoccult  Negative previously He has not had a colonoscopy in 15 years and refuses to do  this  Last   Iron saturation was normal . He was taking OTC B-12 But for sometime has not taken this, he says he does not like to take a lot of medications    Lab Results  Component Value Date   WBC 6.2 08/21/2016   HGB 12.0 (L) 08/21/2016   HCT 36.4 (L) 08/21/2016   MCV 92.4 08/21/2016   PLT 268.0 08/21/2016   Last foot exam in 10/2015   Examination:   BP (!) 142/86   Pulse 71   Ht 5' 10"  (1.778 m)   Wt 225 lb 3.2 oz (102.2 kg)   SpO2 97%   BMI 32.31 kg/m   Body mass index is 32.31 kg/m.      Assesment:   1. Diabetes type 2  See history of present illness for detailed discussion of current blood sugar patterns, problems identified and day-to-day management  A1c is Reasonably good at 7.4 His blood sugars are overall fairly well controlled with more variability in the evenings before and after supper probably because of inconsistent bolusing For his age he is doing very well Despite having occasional difficulty with memory is trying to bolus before eating but may not do this consistently at suppertime   Although he thinks he has an incorrect monitor his readings are in line with expected readings at various times  Explained that sometimes morning sugars may fluctuate based on Dawn phenomenon or bedtime snack as well as timing of changing his pump  Reminded him to bolus before eating and may do this writer at the beginning of the meals for convenience  He was given a new FreeStyle meter also to compare with old one  2. Hypertension: His blood pressure is high and he has not monitored at home, likely needs  additional medication He has taken HCTZ about 4 years ago and will restart this Will need to follow-up in one month  3.  Mild anemia: has been stable   4.   MEMORY deficit: He thinks he is not taking any medications and needs to follow-up with neurologist.  This is be necessary because of his continued difficulties with word finding and need to know if he has any  progression objectively  5.  Change in stools: Not clear this could be related to metformin; he denies that he is taking any Aricept However Will empirically try him on generic extended release metformin instead of 1 g of regular metformin twice a day He will check to make sure he is not taking Aricept He will need to follow-up with gastroenterologist if stools are still continuing to be the same Check liver functions today  Total visit time for evaluation and management of various problems, counseling, review of labs, coordination of care, medication management = 25 minutes  Patient Instructions  Change Metformin to ER formulation, 500 mg, take 1 at breakfast and 2 at dinner, prescription at the local drugstore This may help with the loose stools  Please confirm that you're not taking the donezepil from the neurologist for memory, this may possibly cause change in  stools  If this problem with the loose stools continues in May need to see  gastroenterology specialist Wal[place] Field again  Please call the neurologist Dr. Delice Lesch for follow-up  Start taking hydrochlorothiazide 12.5 mg daily in addition to benazepril for blood pressure Start checking blood pressure, get an Omron arm blood pressure cuff  Try to make sure you take the bolus clicks with the pump before you eat preferably    Sekai Nayak 11/20/2016, 2:29 PM

## 2016-11-20 NOTE — Patient Instructions (Addendum)
Change Metformin to ER formulation, 500 mg, take 1 at breakfast and 2 at dinner, prescription at the local drugstore This may help with the loose stools  Please confirm that you're not taking the donezepil from the neurologist for memory, this may possibly cause change in  stools  If this problem with the loose stools continues in May need to see  gastroenterology specialist Jonathan Hensley again  Please call the neurologist Dr. Delice Lesch for follow-up  Start taking hydrochlorothiazide 12.5 mg daily in addition to benazepril for blood pressure Start checking blood pressure, get an Omron arm blood pressure cuff  Try to make sure you take the bolus clicks with the pump before you eat preferably

## 2016-11-21 DIAGNOSIS — E113312 Type 2 diabetes mellitus with moderate nonproliferative diabetic retinopathy with macular edema, left eye: Secondary | ICD-10-CM | POA: Diagnosis not present

## 2016-11-21 NOTE — Progress Notes (Signed)
Please ask him how much vitamin B12 he is taking if any; level is low normal.  Also remind him to follow-up with neurologist Dr. Delice Lesch

## 2016-12-01 ENCOUNTER — Ambulatory Visit (INDEPENDENT_AMBULATORY_CARE_PROVIDER_SITE_OTHER): Payer: Medicare Other | Admitting: Neurology

## 2016-12-01 ENCOUNTER — Encounter: Payer: Self-pay | Admitting: Neurology

## 2016-12-01 VITALS — BP 140/70 | HR 55 | Ht 70.0 in | Wt 226.4 lb

## 2016-12-01 DIAGNOSIS — R4789 Other speech disturbances: Secondary | ICD-10-CM

## 2016-12-01 DIAGNOSIS — C61 Malignant neoplasm of prostate: Secondary | ICD-10-CM | POA: Diagnosis not present

## 2016-12-01 MED ORDER — DONEPEZIL HCL 5 MG PO TABS: ORAL_TABLET | ORAL | 11 refills | Status: DC

## 2016-12-01 NOTE — Progress Notes (Signed)
NEUROLOGY FOLLOW UP OFFICE NOTE  Jonathan Hensley [id number] 02/17/1941  HISTORY OF PRESENT ILLNESS: I had the pleasure of seeing Jonathan Hensley in follow-up in the neurology clinic on 12/01/2016.  The patient was last seen 5 months ago for word-finding difficulties. He is accompanied by his wife who helps supplement the history today today.  Records and images were personally reviewed where available.  I personally reviewed MRI brain without contrast which did not show any acute changes. There was mild diffuse atrophy and moderate chronic microvascular disease. His MOCA score in February 2018 was normal, 28/30. We had discussed starting donepezil if MRI did not show any stroke, however he was concerned about side effects and opted not to start it. He returns today with his wife, they report that word-finding difficulties are unchanged, they do not occur every time he tries to speak. Once in a while he seems to go well for a time, then has a spell, worse when he starts getting frustrated. His wife reports that his memory is generally speaking good, he remembers to take his multiple medications without difficulties. His driving is "wonderful," they deny getting lost. His wife is in charge of finances. He denies misplacing things frequently. He denies any headaches, dizziness, diplopia, focal numbness/tingling/weakness, no falls. His B12 level was low normal, 246, he takes vitamin B12 171mg daily. TSH normal.  HPI 07/05/2016: This is a pleasant 76yo RH man with a history of diabetes, atrial fibrillation, sleep apnea, who presented with word-finding difficulties. He started noticing changes a year ago, but they have worsened to the point now where he goes blank with what he was saying. He has difficulty giving the name Jonathan Pontesduring the visit today. He states he cannot even say grace at dinner without completely stopping. He denies getting lost driving, no missed medications. His wife is in charge of bill  payments. He denies any problems multitasking or frequently misplacing things. He denies any family history of memory loss, no alcohol intake. He had a head injury in 2013 requiring staples. He had seen neurologist Dr. PLeta Baptistin 2016 for nystagmus that possibly started since his fall in 2013. He had an MRI brain with and without contrast which did not show any acute changes. There was mild perisylvian and moderate mesial temporal atrophy, mild chronic microvascular disease.   PAST MEDICAL HISTORY: Past Medical History:  Diagnosis Date  . Arthritis    rheumatoid in his hands  . Atrial fibrillation ([place]   . Bulging lumbar disc   . [place]   . DDD (degenerative disc disease), cervical   . Diabetes mellitus   . Diabetic retinopathy ([place]   . Double vision   . Dysrhythmia    Atrial Fibrillation  . History of prostate cancer   . Hypertension   . Low back pain   . Nephrolithiasis   . Sleep apnea    uses cpap    MEDICATIONS: Current Outpatient Prescriptions on File Prior to Visit  Medication Sig Dispense Refill  . apixaban (ELIQUIS) 5 MG TABS tablet Take 5 mg by mouth 2 (two) times daily.    . benazepril (LOTENSIN) 40 MG tablet Take 40 mg by mouth at bedtime.     .Marland Kitchenezetimibe (ZETIA) 10 MG tablet Take 10 mg by mouth at bedtime.     .Marland KitchenFREESTYLE LITE test strip USE TO CHECK BLOOD SUGAR FOUR TIMES A DAY AS INSTRUCTED 400 each 1  . hydrochlorothiazide (MICROZIDE) 12.5 MG capsule Take 1  capsule (12.5 mg total) by mouth daily. 30 capsule 1  . Insulin Disposable Pump (V-GO 20) KIT USE ONE PER DAY 90 kit 3  . ketoconazole (NIZORAL) 2 % cream Apply 1 application topically daily.    . Lancets (FREESTYLE) lancets Use as instructed to check blood sugar 4 times per day dx code 250.00 400 each 3  . metFORMIN (GLUCOPHAGE-XR) 500 MG 24 hr tablet Take 3 tablets (1,500 mg total) by mouth daily with supper. 90 tablet 3  . NOVOLOG 100 UNIT/ML injection USE UP TO 56 UNITS IN V-GO PUMP DAILY AS  DIRECTED 60 mL 2  . simvastatin (ZOCOR) 20 MG tablet Take 20 mg by mouth at bedtime.      No current facility-administered medications on file prior to visit.     ALLERGIES: Allergies  Allergen Reactions  . Other Other (See Comments)    Beta or alpha blockers: severe dizziness  . Tramadol Other (See Comments)    Dizziness     FAMILY HISTORY: Family History  Problem Relation Age of Onset  . Heart failure Father   . Heart failure Mother   . Heart failure Brother        Half brother.  . Colon cancer Neg Hx     SOCIAL HISTORY: Social History   Social History  . Marital status: Married    Spouse name: [name]  . Number of children: 2  . Years of education: 12   Occupational History  . Not on file.   Social History Main Topics  . Smoking status: Former Smoker    Quit date: 02/12/1973  . Smokeless tobacco: Never Used  . Alcohol use No  . Drug use: No  . Sexual activity: Yes   Other Topics Concern  . Not on file   Social History Narrative   Lives at home with wife   Drinks 4 cups of coffee a day    REVIEW OF SYSTEMS: Constitutional: No fevers, chills, or sweats, no generalized fatigue, change in appetite Eyes: No visual changes, double vision, eye pain Ear, nose and throat: No hearing loss, ear pain, nasal congestion, sore throat Cardiovascular: No chest pain, palpitations Respiratory:  No shortness of breath at rest or with exertion, wheezes GastrointestinaI: No nausea, vomiting, diarrhea, abdominal pain, fecal incontinence Genitourinary:  No dysuria, urinary retention or frequency Musculoskeletal:  + neck pain, back pain Integumentary: No rash, pruritus, skin lesions Neurological: as above Psychiatric: No depression, insomnia, anxiety Endocrine: No palpitations, fatigue, diaphoresis, mood swings, change in appetite, change in weight, increased thirst Hematologic/Lymphatic:  No anemia, purpura, petechiae. Allergic/Immunologic: no itchy/runny eyes, nasal  congestion, recent allergic reactions, rashes  PHYSICAL EXAM: Vitals:   12/01/16 1042  BP: 140/70  Pulse: (!) 55   General: No acute distress Head:  Normocephalic/atraumatic Neck: supple, no paraspinal tenderness, full range of motion Heart:  Regular rate and rhythm Lungs:  Clear to auscultation bilaterally Back: No paraspinal tenderness Skin/Extremities: No rash, no edema Neurological Exam: alert and oriented to person, place, and time. No aphasia or dysarthria. Fund of knowledge is appropriate.  Recent and remote memory are intact. 3/3 delayed recall. Attention and concentration are normal.    Able to name objects and repeat phrases. Cranial nerves: Pupils equal, round, reactive to light. Extraocular movements intact with no nystagmus. Visual fields full. Facial sensation intact. No facial asymmetry. Tongue, uvula, palate midline.  Motor: Bulk and tone normal, muscle strength 5/5 throughout with no pronator drift.  Sensation to light touch intact.  No  extinction to double simultaneous stimulation.  Deep tendon reflexes 2+ throughout, toes downgoing.  Finger to nose testing intact.  Gait narrow-based and steady, able to tandem walk adequately.  Romberg negative.  IMPRESSION: This is a pleasant 76 yo RH man with a history of  diabetes, atrial fibrillation, sleep apnea, who presented with word-finding difficulties. His neurological exam is non-focal, MOCA score in February 2018 normal 28/30. We discussed how word-finding difficulties can be seen as presenting symptoms of neurodegenerative conditions, no evidence of stroke on MRI. We discussed the option of starting donepezil to help potentially slow down symptoms, he was concerned about side effects, but would like to start low dose donepezil 32m 1/2 tablet daily for 1 month, then increase to 1 tablet daily. Side effects were discussed. We discussed that if symptoms worsen in the interim, he will be scheduled for Neurocognitive testing to further  delineate speech difficulties. We discussed the importance of control of vascular risk factors, physical exercise, and brain stimulation exercises for brain health. His BP is elevated today, and was elevated at Dr. KRonnie Derbyoffice, we discussed white coat hypertension and monitoring BP at home to give a better idea of BP. Continue B12 supplements. He will follow-up in 6 months and knows to call for any changes.  Thank you for allowing me to participate in his care.  Please do not hesitate to call for any questions or concerns.  The duration of this appointment visit was 25 minutes of face-to-face time with the patient.  Greater than 50% of this time was spent in counseling, explanation of diagnosis, planning of further management, and coordination of care.   KEllouise Newer M.D.   CC: Dr. KDwyane Dee Dr. GEinar Gip

## 2016-12-01 NOTE — Patient Instructions (Signed)
1. Start Donepezil 5mg : Take 1/2 tablet daily for 1 month, then increase to 1 tablet daily 2. Control of blood pressure, cholesterol, diabetes, as well as physical exercise and brain stimulation exercises are important for brain health 3. Follow-up in 6 months, call our office for any changes. If any worsening before your visit, call and we will schedule you for Neurocognitive testing

## 2016-12-08 ENCOUNTER — Telehealth: Payer: Self-pay | Admitting: Neurology

## 2016-12-08 NOTE — Telephone Encounter (Signed)
Donepezil.    Think this just an FYI, but do you have any other suggestions?

## 2016-12-08 NOTE — Telephone Encounter (Signed)
We had discussed potentially doing more testing with Neurocognitive testing for his speech difficulties. Does he want to do that first before we consider another medication? It will be helpful to determine if medication would be helpful or not. Thanks

## 2016-12-08 NOTE — Telephone Encounter (Signed)
PT called and said he can't use the medication Dr Delice Lesch prescribed for him, causes dizziness, he doesn't know the name of the medication

## 2016-12-15 DIAGNOSIS — I10 Essential (primary) hypertension: Secondary | ICD-10-CM | POA: Diagnosis not present

## 2016-12-15 DIAGNOSIS — E1165 Type 2 diabetes mellitus with hyperglycemia: Secondary | ICD-10-CM | POA: Diagnosis not present

## 2016-12-15 DIAGNOSIS — I480 Paroxysmal atrial fibrillation: Secondary | ICD-10-CM | POA: Diagnosis not present

## 2016-12-15 DIAGNOSIS — G4733 Obstructive sleep apnea (adult) (pediatric): Secondary | ICD-10-CM | POA: Diagnosis not present

## 2016-12-22 ENCOUNTER — Ambulatory Visit (INDEPENDENT_AMBULATORY_CARE_PROVIDER_SITE_OTHER): Payer: Medicare Other | Admitting: Endocrinology

## 2016-12-22 ENCOUNTER — Encounter: Payer: Self-pay | Admitting: Endocrinology

## 2016-12-22 VITALS — BP 148/86 | HR 61 | Ht 70.0 in | Wt 225.0 lb

## 2016-12-22 DIAGNOSIS — E1165 Type 2 diabetes mellitus with hyperglycemia: Secondary | ICD-10-CM | POA: Diagnosis not present

## 2016-12-22 DIAGNOSIS — I10 Essential (primary) hypertension: Secondary | ICD-10-CM | POA: Diagnosis not present

## 2016-12-22 DIAGNOSIS — E118 Type 2 diabetes mellitus with unspecified complications: Secondary | ICD-10-CM

## 2016-12-22 LAB — POCT GLYCOSYLATED HEMOGLOBIN (HGB A1C): Hemoglobin A1C: 7.7

## 2016-12-22 MED ORDER — HYDROCHLOROTHIAZIDE 12.5 MG PO CAPS: 12.5000 mg | ORAL_CAPSULE | Freq: Every day | ORAL | 1 refills | Status: DC

## 2016-12-22 NOTE — Patient Instructions (Addendum)
Restart HYDROCHLORTHIAZIDE  MUST click BEFORE EACH MEAL regardless of premeal sugars  If am sugars stay over 160 try the 30 unit pump

## 2016-12-22 NOTE — Progress Notes (Signed)
Patient ID: Jonathan Hensley, male   DOB: April 19, 1941, 76 y.o.   MRN: [id number]   Reason for Appointment: follow-up of various problems  History of Present Illness    Type 2 diabetes mellitus, date of diagnosis: 1994.   He has been on insulin for the last few years, starting with only bedtime NPH and subsequently progressing to basal bolus regimen Also taking glipizide and metformin long-term  The HbgA1c was highest in 2013 at 8.2 Because of more fluctuation in his blood sugars at all times, difficulty with compliance and inconvenience of the multiple insulin dose regimen as well as needing higher doses of insulin he was started on the V.-go pump in 05/2013  Recent history:   The insulin regimen is: basal rate of 20 units on V- go pump; boluses with carbohydrate coverage 1:10; usually 6-8 units 2-3 times a day   His A1c has been consistently over 7% and 7.7 from today   Current blood sugar patterns and problems identified:  He has the incorrect time on his monitor and not clear which of his morning readings are before breakfast  Also his blood sugars appear to be more erratic  Highest blood sugars are in the afternoon probably after lunch  After discussion today he appears to be not taking his mealtime boluses until his blood sugar goes up  He is concerned about his FASTING readings being recently higher  However this is not consistent and has had readings as low as 98 also in the morning; also because of the time being incorrect still not clear whether he is readings are higher on waking up or during the night  His BOLUSES are not being done before eating as discussed on his previous visit even though he is aware of need to cover all carbohydrates with mealtime insulin  No hypoglycemia with lowest reading about 97  He does think he changes his pump daily in the morning around the same time . He knows how to count carbohydrates usually Difficulties with medication  adherence: Bolusing for meals after eating at times, not covering snacks or desserts with boluses   Glucose monitoring: Freestyle, checking 3-4 times a day  Blood Glucose readings from download analysis:   Difficult to estimate averages at any given time as time is not correctly programmed on his monitor   PRE-MEAL Fasting Lunch Dinner Bedtime Overall  Glucose range:  98-_0 26-216  145-358  40-274    Mean/median: 161   220   188+/-57      Wt Readings from Last 3 Encounters:  12/22/16 225 lb (102.1 kg)  12/01/16 226 lb 7 oz (102.7 kg)  11/20/16 225 lb 3.2 oz (102.2 kg)   Meals: 2-3 meals per day and sometimes no lunch.    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 Always use your most recent med list.          benazepril 40 MG tablet Commonly known as:  LOTENSIN Take 40 mg by mouth at bedtime.   donepezil 5 MG tablet Commonly known as:  ARICEPT Take 1/2 tablet daily for 1 month, then increase to 1 tablet daily   ELIQUIS 5 MG Tabs tablet Generic drug:  apixaban Take 5 mg by mouth 2 (two) times daily.   ezetimibe 10 MG tablet Commonly known as:  ZETIA Take 10 mg by mouth at bedtime.   freestyle lancets Use as instructed to check blood sugar 4 times per day dx  code 250.00   FREESTYLE LITE test strip Generic drug:  glucose blood USE TO CHECK BLOOD SUGAR FOUR TIMES A DAY AS INSTRUCTED   hydrochlorothiazide 12.5 MG capsule Commonly known as:  MICROZIDE Take 1 capsule (12.5 mg total) by mouth daily.   ketoconazole 2 % cream Commonly known as:  NIZORAL Apply 1 application topically daily.   metFORMIN 500 MG 24 hr tablet Commonly known as:  GLUCOPHAGE-XR Take 3 tablets (1,500 mg total) by mouth daily with supper.   NOVOLOG 100 UNIT/ML injection Generic drug:  insulin aspart USE UP TO 56 UNITS IN V-GO PUMP DAILY AS DIRECTED   simvastatin 20 MG tablet Commonly known as:  ZOCOR Take 20 mg by mouth at bedtime.   V-GO 20 Kit USE ONE PER DAY       Allergies:  Allergies  Allergen Reactions  . Other Other (See Comments)    Beta or alpha blockers: severe dizziness  . Tramadol Other (See Comments)    Dizziness     Past Medical History:  Diagnosis Date  . Arthritis    rheumatoid in his hands  . Atrial fibrillation ([place])   . Bulging lumbar disc   . Cancer ([place])   . DDD (degenerative disc disease), cervical   . Diabetes mellitus   . Diabetic retinopathy ([place])   . Double vision   . Dysrhythmia    Atrial Fibrillation  . History of prostate cancer   . Hypertension   . Low back pain   . Nephrolithiasis   . Sleep apnea    uses cpap    Past Surgical History:  Procedure Laterality Date  . APPENDECTOMY    . BACK SURGERY    . Bilateral L4-5 lumbar decompression including bilateral  11/2008  . brachytherapy    . COLONOSCOPY    . CYSTOSCOPY    . Left L4-L5 lumbar laminotomy and microdiskectomy with  07/14/2008    . LITHOTRIPSY    . LUMBAR LAMINECTOMY/DECOMPRESSION MICRODISCECTOMY Left 08/18/2013   Procedure: Left Lumbar Five-Sacral One Microdiskectomy;  Surgeon: [name], MD;  Location: [facility];  Service: Neurosurgery;  Laterality: Left;  Left Lumbar Five-Sacral One Microdiskectomy  . rotator cuff surgery    .  Scleral buckle, laser photocoagulation, and anterior chamber  01/04/2009    Family History  Problem Relation Age of Onset  . Heart failure Father   . Heart failure Mother   . Heart failure Brother        Half brother.  . Colon cancer Neg Hx     Social History:  reports that he quit smoking about 43 years ago. He has  never used smokeless tobacco. He reports that he does not drink alcohol or use drugs.  Review of Systems  He still complaining about sciatica pain and is going to see his neurosurgeon again  He has been referred to the neurologist because of difficulty with word finding and continuing his sentence.   He appears to have some difficulty remembering also during the course of the discussion today He thinks he is taking Aricept and is going to see the neurologist and follow-up as recommended on the last visit  Hypertension:  Blood pressure is being treated  with benazepril 40 mg, also followed by cardiologist  He was asked to start HCTZ because of his blood pressure being significantly high on his last visit He thinks he took this but did not get a refill this week and blood pressure is high again  Currently not checking at home   BP Readings from Last 3 Encounters:  12/22/16 (!) 148/86  12/01/16 140/70  11/20/16 (!) 142/86     He is taking sodium bicarbonate for his history of kidney stones from urologist  HYPERLIPIDEMIA: Has been on simvastatin and Zetia with good results    Lab Results  Component Value Date   CHOL 126 08/21/2016   HDL 47.30 08/21/2016   LDLCALC 56 08/21/2016   TRIG 113.0 08/21/2016   CHOLHDL 3 08/21/2016     He has had retinal detachment treated surgically in the past, followed by ophthalmologist, Has decreased vision in the left eye  Followed very regularly   Sleep apnea, is on CPAP   Anemia :mild and stable He has not had a colonoscopy in 15 years and refuses to do this  Last   Iron saturation was normal . He was taking OTC B-12  But for sometime has not taken this, he says he does not like to take a lot of medications    Lab Results  Component Value Date   WBC 6.7 11/20/2016   HGB 12.3 (L) 11/20/2016   HCT 37.0 (L) 11/20/2016   MCV 91.4 11/20/2016   PLT 256.0 11/20/2016     Examination:   BP (!) 148/86 (Cuff Size: Large)   Pulse 61   Ht _0  (1.778 m)   Wt 225 lb (102.1 kg)   SpO2 98%   BMI 32.28 kg/m   Body mass index is 32.28 kg/m.      Assesment:    Hypertension: His blood pressure is high and he has not monitored at home again He has taken HCTZ since last visit for 30 days but not clear if this is working since he ran out about a couple of days ago Advised him about taking this regularly and he will restart this   Diabetes type 2  See history of present illness for detailed discussion of current blood sugar patterns, problems identified and day-to-day management  A1c is slightly higher at 7.7 As discussed above he is not getting mealtime boluses and with his memory difficulties not clear if he is going to do this consistently Although his fasting readings appear to be high some of this may be related to inadequate mealtime boluses especially in the evenings since he is eating out frequently Also fasting readings are consistently high and difficult to judge patterns because his time on the monitor is off by a couple of hours at least  Since his level of control is reasonable considering his overall medical condition able wait until next time to try a higher basal rate pump  He will call if fasting readings continue to be high 160 even after doing his mealtime boluses every day with every meal  3.  Mild anemia: Needs regular follow-up and repeat Hemoccult  4.   MEMORY deficit: He will follow-up with neurologist, apparently he is taking Aricept    Patient Instructions  Restart HYDROCHLORTHIAZIDE  MUST click BEFORE EACH MEAL regardless of premeal sugars  If am sugars stay over 160 try  the 30 unit pump    Jonathan Hensley 12/24/2016, 8:33 PM     Counseling time on subjects discussed in assessment and plan sections is over 50% of today's 25 minute visit

## 2016-12-26 DIAGNOSIS — M546 Pain in thoracic spine: Secondary | ICD-10-CM | POA: Diagnosis not present

## 2016-12-26 DIAGNOSIS — R030 Elevated blood-pressure reading, without diagnosis of hypertension: Secondary | ICD-10-CM | POA: Diagnosis not present

## 2016-12-26 DIAGNOSIS — M5416 Radiculopathy, lumbar region: Secondary | ICD-10-CM | POA: Diagnosis not present

## 2016-12-26 DIAGNOSIS — Z6832 Body mass index (BMI) 32.0-32.9, adult: Secondary | ICD-10-CM | POA: Diagnosis not present

## 2016-12-26 DIAGNOSIS — M5136 Other intervertebral disc degeneration, lumbar region: Secondary | ICD-10-CM | POA: Diagnosis not present

## 2016-12-26 DIAGNOSIS — Z981 Arthrodesis status: Secondary | ICD-10-CM | POA: Diagnosis not present

## 2016-12-26 DIAGNOSIS — M47816 Spondylosis without myelopathy or radiculopathy, lumbar region: Secondary | ICD-10-CM | POA: Diagnosis not present

## 2016-12-26 DIAGNOSIS — Z9889 Other specified postprocedural states: Secondary | ICD-10-CM | POA: Diagnosis not present

## 2016-12-27 DIAGNOSIS — M5127 Other intervertebral disc displacement, lumbosacral region: Secondary | ICD-10-CM | POA: Diagnosis not present

## 2016-12-27 DIAGNOSIS — M5416 Radiculopathy, lumbar region: Secondary | ICD-10-CM | POA: Diagnosis not present

## 2017-01-01 DIAGNOSIS — I10 Essential (primary) hypertension: Secondary | ICD-10-CM | POA: Diagnosis not present

## 2017-01-01 DIAGNOSIS — Z981 Arthrodesis status: Secondary | ICD-10-CM | POA: Diagnosis not present

## 2017-01-01 DIAGNOSIS — M47816 Spondylosis without myelopathy or radiculopathy, lumbar region: Secondary | ICD-10-CM | POA: Diagnosis not present

## 2017-01-01 DIAGNOSIS — M5126 Other intervertebral disc displacement, lumbar region: Secondary | ICD-10-CM | POA: Diagnosis not present

## 2017-01-01 DIAGNOSIS — Z9889 Other specified postprocedural states: Secondary | ICD-10-CM | POA: Diagnosis not present

## 2017-01-01 DIAGNOSIS — M5136 Other intervertebral disc degeneration, lumbar region: Secondary | ICD-10-CM | POA: Diagnosis not present

## 2017-01-01 DIAGNOSIS — M5416 Radiculopathy, lumbar region: Secondary | ICD-10-CM | POA: Diagnosis not present

## 2017-01-01 DIAGNOSIS — Z6832 Body mass index (BMI) 32.0-32.9, adult: Secondary | ICD-10-CM | POA: Diagnosis not present

## 2017-01-03 ENCOUNTER — Ambulatory Visit: Payer: Medicare Other | Admitting: Neurology

## 2017-01-19 ENCOUNTER — Other Ambulatory Visit: Payer: Self-pay | Admitting: Endocrinology

## 2017-02-15 ENCOUNTER — Encounter: Payer: Medicare Other | Admitting: Psychology

## 2017-02-20 ENCOUNTER — Other Ambulatory Visit: Payer: Self-pay | Admitting: Endocrinology

## 2017-02-20 DIAGNOSIS — Z23 Encounter for immunization: Secondary | ICD-10-CM | POA: Diagnosis not present

## 2017-02-21 ENCOUNTER — Encounter: Payer: Self-pay | Admitting: Endocrinology

## 2017-02-21 ENCOUNTER — Ambulatory Visit (INDEPENDENT_AMBULATORY_CARE_PROVIDER_SITE_OTHER): Payer: Medicare Other | Admitting: Endocrinology

## 2017-02-21 VITALS — BP 140/86 | HR 71 | Ht 70.0 in | Wt 223.2 lb

## 2017-02-21 DIAGNOSIS — Z794 Long term (current) use of insulin: Secondary | ICD-10-CM

## 2017-02-21 DIAGNOSIS — E538 Deficiency of other specified B group vitamins: Secondary | ICD-10-CM | POA: Diagnosis not present

## 2017-02-21 DIAGNOSIS — E1165 Type 2 diabetes mellitus with hyperglycemia: Secondary | ICD-10-CM | POA: Diagnosis not present

## 2017-02-21 DIAGNOSIS — D508 Other iron deficiency anemias: Secondary | ICD-10-CM

## 2017-02-21 DIAGNOSIS — R413 Other amnesia: Secondary | ICD-10-CM | POA: Diagnosis not present

## 2017-02-21 DIAGNOSIS — I10 Essential (primary) hypertension: Secondary | ICD-10-CM | POA: Diagnosis not present

## 2017-02-21 NOTE — Progress Notes (Signed)
Patient ID: IRISH PIECH, male   DOB: 20-May-1940, 76 y.o.   MRN: [id number]   Reason for Appointment: follow-up of various problems  History of Present Illness    Type 2 diabetes mellitus, date of diagnosis: 1994.   He has been on insulin for the last few years, starting with only bedtime NPH and subsequently progressing to basal bolus regimen Also taking glipizide and metformin long-term  The HbgA1c was highest in 2013 at 8.2 Because of more fluctuation in his blood sugars at all times, difficulty with compliance and inconvenience of the multiple insulin dose regimen as well as needing higher doses of insulin he was started on the V.-go pump in 05/2013  Recent history:   The insulin regimen is: basal rate of 20 units on V- go pump; boluses with carbohydrate coverage 1:10; usually 6-8 units 2-3 times a day   His A1c has been consistently over 7% and 7.7 from August  Current blood sugar patterns and problems identified:  He did not bring his monitor for download  He thinks his blood sugars are doing better and not as highs before when he was having some readings over 300  However does not know what his blood sugars actually are except for this morning when it is 96  He thinks he is bolusing for every meal more regularly although it was doubtful on his last visit  Also he says he is not worried about his diabetes because he wants to take care of his back problem first  Usually has no hypoglycemia although did have a low reading once last time . He knows how to count carbohydrates usually Difficulties with medication adherence: Bolusing for meals after eating at times, not covering snacks or desserts with boluses   Glucose monitoring: Freestyle, Blood Glucose readings not available      Wt Readings from Last 3 Encounters:  02/21/17 223 lb 3.2 oz (101.2 kg)  12/22/16 225 lb (102.1 kg)  12/01/16 226 lb 7 oz (102.7 kg)   Meals: 2-3 meals per day and sometimes no  lunch.    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 Always use your most recent med list.          benazepril 40 MG tablet Commonly known as:  LOTENSIN Take 40 mg by mouth at bedtime.   ELIQUIS 5 MG Tabs tablet Generic drug:  apixaban Take 5 mg by mouth 2 (two) times daily.   ezetimibe 10 MG tablet Commonly known as:  ZETIA Take 10 mg by mouth at bedtime.   freestyle lancets Use as instructed to check blood sugar 4 times per day dx code  250.00   FREESTYLE LITE test strip Generic drug:  glucose blood USE TO CHECK BLOOD SUGAR FOUR TIMES A DAY AS INSTRUCTED   ketoconazole 2 % cream Commonly known as:  NIZORAL Apply 1 application topically daily.   metFORMIN 500 MG 24 hr tablet Commonly known as:  GLUCOPHAGE-XR Take 3 tablets (1,500 mg total) by mouth daily with supper.   NOVOLOG 100 UNIT/ML injection Generic drug:  insulin aspart USE UP TO 56 UNITS IN V-GO PUMP DAILY AS DIRECTED   simvastatin 20 MG tablet Commonly known as:  ZOCOR Take 20 mg by mouth at bedtime.   V-GO 20 Kit USE ONE PER DAY       Allergies:  Allergies  Allergen Reactions  . Other Other (See Comments)    Beta or alpha blockers: severe dizziness  . Tramadol Other (See Comments)    Dizziness     Past Medical  History:  Diagnosis Date  . Arthritis    rheumatoid in his hands  . Atrial fibrillation ([place])   . Bulging lumbar disc   . Cancer ([place])   . DDD (degenerative disc disease), cervical   . Diabetes mellitus   . Diabetic retinopathy ([place])   . Double vision   . Dysrhythmia    Atrial Fibrillation  . History of prostate cancer   . Hypertension   . Low back pain   . Nephrolithiasis   . Sleep apnea    uses cpap    Past Surgical History:  Procedure Laterality Date  . APPENDECTOMY    . BACK SURGERY    . Bilateral L4-5 lumbar decompression including bilateral  11/2008  . brachytherapy    . COLONOSCOPY    . CYSTOSCOPY    . Left L4-L5 lumbar laminotomy and microdiskectomy with  07/14/2008    . LITHOTRIPSY    . LUMBAR LAMINECTOMY/DECOMPRESSION MICRODISCECTOMY Left 08/18/2013   Procedure: Left Lumbar Five-Sacral One Microdiskectomy;  Surgeon: [name], MD;  Location: [facility];  Service: Neurosurgery;  Laterality: Left;  Left Lumbar Five-Sacral One Microdiskectomy  . rotator cuff surgery    . Scleral buckle, laser photocoagulation, and anterior chamber  01/04/2009    Family History  Problem Relation Age of Onset  . Heart failure Father   . Heart failure Mother   . Heart failure Brother        Half brother.  . Colon cancer Neg Hx     Social History:  reports that he quit smoking about 44 years ago. He has never used smokeless tobacco. He reports that he does not drink alcohol or use drugs.  Review of Systems  Waiting for neurosurgeon opinion on whether he needs another surgery on his back  He has been referred to the neurologist because of difficulty with word finding and continuing his sentence. He refuses to take the Aricept because it had a side effect of dizziness and he was told there is no other medication However he still has difficulty remembering Offered him to take Exelon but since it has a side effect of potential dizziness he will not take this      Hypertension:  Blood pressure is being treated  with benazepril 40 mg, also followed by cardiologist  He was asked to start HCTZ but he says he did not take it because he wanted to have his back problem treated first  Currently checking at home with the new wrist instrument, he thinks recently blood pressure was 135/77   BP Readings from Last 3  Encounters:  02/21/17 140/86  12/22/16 (!) 148/86  12/01/16 140/70     He is taking sodium bicarbonate for his history of kidney stones from urologist  HYPERLIPIDEMIA: Has been on simvastatin and Zetia with good results    Lab Results  Component Value Date   CHOL 126 08/21/2016   HDL 47.30 08/21/2016   LDLCALC 56 08/21/2016   TRIG 113.0 08/21/2016   CHOLHDL 3 08/21/2016     He has had retinal detachment treated surgically in the past, followed by ophthalmologist regularly , Has decreased vision in the left eye   Sleep apnea, is on CPAP   Anemia :mild and stable  Last   Iron saturation was normal . He was taking OTC B-12 But for sometime has not taken this, he says he does not like to take a lot of medications    Lab Results  Component Value Date   WBC 6.7 11/20/2016   HGB 12.3 (L) 11/20/2016   HCT 37.0 (L) 11/20/2016   MCV 91.4 11/20/2016   PLT 256.0 11/20/2016     Examination:   BP 140/86   Pulse 71   Ht _0  (1.778 m)   Wt 223 lb 3.2 oz (101.2 kg)   SpO2 97%   BMI 32.03 kg/m   Body mass index is 32.03 kg/m.    Repeat blood pressure 150/78, small cuff  Assesment:    Hypertension: His blood pressure is Relatively high today but he is anxious today because of getting late for his appointment  He will not take HCTZ as he thinks his blood pressure is improving and he will not take any medication before his potential back surgery   Diabetes type 2  See history of present illness for  discussion of current blood sugar patterns, problems identified and day-to-day management He does not have his monitor and  not clear what his blood sugar control is.  Will recheck on the next visit Emphasized the need for bolusing before every meal and to bring monitor on next visit  3.  Mild anemia: Will recheck on next visit  4.   MEMORY deficit: He will get a second opinion from another neurologist    Patient Instructions  Bring BP and sugar meters next time    [facility] 02/21/2017, 1:00 PM

## 2017-02-21 NOTE — Patient Instructions (Signed)
Bring BP and sugar meters next time

## 2017-03-05 DIAGNOSIS — M47816 Spondylosis without myelopathy or radiculopathy, lumbar region: Secondary | ICD-10-CM | POA: Diagnosis not present

## 2017-03-05 DIAGNOSIS — M5136 Other intervertebral disc degeneration, lumbar region: Secondary | ICD-10-CM | POA: Diagnosis not present

## 2017-03-05 DIAGNOSIS — M5126 Other intervertebral disc displacement, lumbar region: Secondary | ICD-10-CM | POA: Diagnosis not present

## 2017-03-05 DIAGNOSIS — Z981 Arthrodesis status: Secondary | ICD-10-CM | POA: Diagnosis not present

## 2017-03-05 DIAGNOSIS — M5416 Radiculopathy, lumbar region: Secondary | ICD-10-CM | POA: Diagnosis not present

## 2017-03-13 ENCOUNTER — Encounter: Payer: Medicare Other | Admitting: Psychology

## 2017-03-16 DIAGNOSIS — M79675 Pain in left toe(s): Secondary | ICD-10-CM | POA: Diagnosis not present

## 2017-03-16 DIAGNOSIS — E1151 Type 2 diabetes mellitus with diabetic peripheral angiopathy without gangrene: Secondary | ICD-10-CM | POA: Diagnosis not present

## 2017-03-16 DIAGNOSIS — B351 Tinea unguium: Secondary | ICD-10-CM | POA: Diagnosis not present

## 2017-03-16 DIAGNOSIS — M79674 Pain in right toe(s): Secondary | ICD-10-CM | POA: Diagnosis not present

## 2017-03-16 LAB — HM DIABETES FOOT EXAM: HM Diabetic Foot Exam: NORMAL

## 2017-03-23 DIAGNOSIS — E113212 Type 2 diabetes mellitus with mild nonproliferative diabetic retinopathy with macular edema, left eye: Secondary | ICD-10-CM | POA: Diagnosis not present

## 2017-03-23 LAB — HM DIABETES EYE EXAM

## 2017-04-10 DIAGNOSIS — C61 Malignant neoplasm of prostate: Secondary | ICD-10-CM | POA: Diagnosis not present

## 2017-04-17 ENCOUNTER — Ambulatory Visit (INDEPENDENT_AMBULATORY_CARE_PROVIDER_SITE_OTHER): Payer: Medicare Other | Admitting: Urology

## 2017-04-17 DIAGNOSIS — Q540 Hypospadias, balanic: Secondary | ICD-10-CM | POA: Diagnosis not present

## 2017-04-17 DIAGNOSIS — N500 Atrophy of testis: Secondary | ICD-10-CM | POA: Diagnosis not present

## 2017-04-17 DIAGNOSIS — Z8546 Personal history of malignant neoplasm of prostate: Secondary | ICD-10-CM | POA: Diagnosis not present

## 2017-04-19 ENCOUNTER — Other Ambulatory Visit: Payer: Self-pay | Admitting: Endocrinology

## 2017-05-03 DIAGNOSIS — Z794 Long term (current) use of insulin: Secondary | ICD-10-CM | POA: Diagnosis not present

## 2017-05-03 DIAGNOSIS — H33031 Retinal detachment with giant retinal tear, right eye: Secondary | ICD-10-CM | POA: Diagnosis not present

## 2017-05-03 DIAGNOSIS — H33312 Horseshoe tear of retina without detachment, left eye: Secondary | ICD-10-CM | POA: Diagnosis not present

## 2017-05-03 DIAGNOSIS — E119 Type 2 diabetes mellitus without complications: Secondary | ICD-10-CM | POA: Diagnosis not present

## 2017-05-23 ENCOUNTER — Ambulatory Visit (INDEPENDENT_AMBULATORY_CARE_PROVIDER_SITE_OTHER): Payer: Medicare Other | Admitting: Diagnostic Neuroimaging

## 2017-05-23 ENCOUNTER — Encounter: Payer: Self-pay | Admitting: Diagnostic Neuroimaging

## 2017-05-23 VITALS — BP 162/77 | HR 65 | Ht 70.0 in | Wt 224.0 lb

## 2017-05-23 DIAGNOSIS — R4789 Other speech disturbances: Secondary | ICD-10-CM | POA: Diagnosis not present

## 2017-05-23 DIAGNOSIS — R413 Other amnesia: Secondary | ICD-10-CM

## 2017-05-23 NOTE — Patient Instructions (Signed)
-   focus on brain healthy activities  - hold off on medications at this time; may consider neuropsych testing in future

## 2017-05-23 NOTE — Progress Notes (Signed)
[facility]  PATIENT: Jonathan Hensley DOB: [birth year]  REFERRING CLINICIAN: Eduard Clos, MD HISTORY FROM: patient and wife and chart review  REASON FOR VISIT: new consult    HISTORICAL  CHIEF COMPLAINT:  Chief Complaint  Patient presents with  . Cecilia  Dr. [name]  . Memory Loss    has had previous work up for memory issues, (does not want to have neuropsych testing done).  Does not want any medication that will cause dizziness.     HISTORY OF PRESENT ILLNESS:   77 year old male here for evaluation of memory disorder.  Past 1-2 years patient has had mild difficulty with forgetting tasks, shopping items, certain words, and recent conversations.  These are mild problems and not affecting his day-to-day life.  He is able to drive, shop, take care of personal needs, and all of his activities of daily living.  Patient was evaluated last year for these problems and MOCA  testing was 28 out of 30.  Patient was offered donepezil empirically to see if this may help in case this was the early onset of dementia.  Upon going to the pharmacy and finding out about side effects of this medication patient declined to start the medication.  In addition they requested second opinion evaluation.  Patient has long history of motion sickness, dizziness, spinning sensation since childhood.  Patient still feels traumatized by these dizzy attacks throughout his life.  These were exacerbated in the past by other medications which aggravated dizziness.  Therefore patient is very reluctant about starting new medications which have dizziness as listed as a side effect.  Patient was offered to go through neuropsychological testing but has declined thus far.    REVIEW OF SYSTEMS: Full 14 system review of systems performed and negative with exception of: Memory loss dizziness back pain.  ALLERGIES: Allergies  Allergen Reactions  . Other Other (See Comments)    Beta or alpha blockers: severe  dizziness  . Tramadol Other (See Comments)    Dizziness     HOME MEDICATIONS: Outpatient Medications Prior to Visit  Medication Sig Dispense Refill  . apixaban (ELIQUIS) 5 MG TABS tablet Take 5 mg by mouth 2 (two) times daily.    . benazepril (LOTENSIN) 40 MG tablet Take 40 mg by mouth at bedtime.     Marland Kitchen ezetimibe (ZETIA) 10 MG tablet Take 10 mg by mouth at bedtime.     Marland Kitchen FREESTYLE LITE test strip USE TO CHECK BLOOD SUGAR FOUR TIMES A DAY AS INSTRUCTED 400 each 1  . Insulin Disposable Pump (V-GO 20) KIT USE ONE PER DAY 90 kit 3  . Lancets (FREESTYLE) lancets Use as instructed to check blood sugar 4 times per day dx code 250.00 400 each 3  . metFORMIN (GLUCOPHAGE-XR) 500 MG 24 hr tablet Take 3 tablets (1,500 mg total) by mouth daily with supper. (Patient taking differently: Take 1,000 mg by mouth 2 (two) times daily. ) 90 tablet 3  . NOVOLOG 100 UNIT/ML injection USE UP TO 56 UNITS IN V-GO PUMP DAILY AS DIRECTED 60 mL 2  . simvastatin (ZOCOR) 20 MG tablet Take 20 mg by mouth at bedtime.     Marland Kitchen ketoconazole (NIZORAL) 2 % cream Apply 1 application topically daily.     No facility-administered medications prior to visit.     PAST MEDICAL HISTORY: Past Medical History:  Diagnosis Date  . Arthritis    rheumatoid in his hands  . Atrial fibrillation ([place])   . Bulging  lumbar disc   . Cancer ([place])   . DDD (degenerative disc disease), cervical   . Diabetes mellitus   . Diabetic retinopathy ([place])   . Double vision   . Dysrhythmia    Atrial Fibrillation  . History of prostate cancer   . Hypertension   . Low back pain   . Nephrolithiasis   . Sleep apnea    uses cpap    PAST SURGICAL HISTORY: Past Surgical History:  Procedure Laterality Date  . APPENDECTOMY    . BACK SURGERY    . Bilateral L4-5 lumbar decompression including bilateral  11/2008  . brachytherapy    . COLONOSCOPY    . CYSTOSCOPY    . Left L4-L5 lumbar laminotomy and microdiskectomy with  07/14/2008    . LITHOTRIPSY     . LUMBAR LAMINECTOMY/DECOMPRESSION MICRODISCECTOMY Left 08/18/2013   Procedure: Left Lumbar Five-Sacral One Microdiskectomy;  Surgeon: [name], MD;  Location: [facility];  Service: Neurosurgery;  Laterality: Left;  Left Lumbar Five-Sacral One Microdiskectomy  . rotator cuff surgery    . Scleral buckle, laser photocoagulation, and anterior chamber  01/04/2009    FAMILY HISTORY: Family History  Problem Relation Age of Onset  . Heart failure Father   . Heart failure Mother   . Heart failure Brother        Half brother.  . Colon cancer Neg Hx     SOCIAL HISTORY:  Social History   Socioeconomic History  . Marital status: Married    Spouse name: [name]  . Number of children: 2  . Years of education: 87  . Highest education level: Not on file  Social Needs  . Financial resource strain: Not on file  . Food insecurity - worry: Not on file  . Food insecurity - inability: Not on file  . Transportation needs - medical: Not on file  . Transportation needs - non-medical: Not on file  Occupational History  . Not on file  Tobacco Use  . Smoking status: Former Smoker    Last attempt to quit: 02/12/1973    Years since quitting: 44.3  . Smokeless tobacco: Never Used  Substance and Sexual Activity  . Alcohol use: No  . Drug use: No  . Sexual activity: Yes  Other Topics Concern  . Not on file  Social History Narrative   Lives at home with wife   Drinks 4 cups of coffee a day     PHYSICAL EXAM  GENERAL EXAM/CONSTITUTIONAL: Vitals:  Vitals:   05/23/17 1115  BP: (!) 162/77  Pulse: 65  Weight: 224 lb (101.6 kg)  Height: 5' 10"  (1.778 m)     Body mass index is 32.14 kg/m.  No exam data present  Patient is in no distress; well developed, nourished and groomed; neck is supple  CARDIOVASCULAR:  Examination of carotid arteries is normal; no carotid bruits  Regular rate and rhythm, no murmurs  Examination of peripheral vascular system by observation and  palpation is normal  EYES:  Ophthalmoscopic exam of optic discs and posterior segments is normal; no papilledema or hemorrhages  MUSCULOSKELETAL:  Gait, strength, tone, movements noted in Neurologic exam below  NEUROLOGIC: MENTAL STATUS:  MMSE - [place] Exam 05/23/2017  Orientation to time 5  Orientation to Place 4  Registration 3  Attention/ Calculation 4  Recall 3  Language- name 2 objects 2  Language- repeat 1  Language- follow 3 step command 3  Language- read & follow direction 1  Write  a sentence 1  Copy design 1  Total score 28    awake, alert, oriented to person, place and time  recent and remote memory intact  normal attention and concentration  language fluent, comprehension intact, naming intact,   fund of knowledge appropriate  CRANIAL NERVE:   2nd - no papilledema on fundoscopic exam  2nd, 3rd, 4th, 6th - pupils equal and reactive to light, visual fields full to confrontation, extraocular muscles intact, no nystagmus  5th - facial sensation symmetric  7th - facial strength symmetric  8th - hearing intact  9th - palate elevates symmetrically, uvula midline  11th - shoulder shrug symmetric  12th - tongue protrusion midline  MOTOR:   normal bulk and tone, full strength in the BUE, BLE  SENSORY:   normal and symmetric to light touch, temperature, vibration  COORDINATION:   finger-nose-finger, fine finger movements normal  REFLEXES:   deep tendon reflexes present and symmetric  GAIT/STATION:   narrow based gait    DIAGNOSTIC DATA (LABS, IMAGING, TESTING) - I reviewed patient records, labs, notes, testing and imaging myself where available.  Lab Results  Component Value Date   WBC 6.7 11/20/2016   HGB 12.3 (L) 11/20/2016   HCT 37.0 (L) 11/20/2016   MCV 91.4 11/20/2016   PLT 256.0 11/20/2016      Component Value Date/Time   NA 137 11/20/2016 1049   K 4.6 11/20/2016 1049   CL 103 11/20/2016 1049   CO2 27  11/20/2016 1049   GLUCOSE 147 (H) 11/20/2016 1049   BUN 24 (H) 11/20/2016 1049   CREATININE 1.19 11/20/2016 1049   CREATININE 1.08 06/02/2013 1243   CALCIUM 10.1 11/20/2016 1049   PROT 7.3 11/20/2016 1049   ALBUMIN 4.1 11/20/2016 1049   AST 16 11/20/2016 1049   ALT 11 11/20/2016 1049   ALKPHOS 42 11/20/2016 1049   BILITOT 0.3 11/20/2016 1049   GFRNONAA 50 (L) 06/14/2016 0426   GFRAA 58 (L) 06/14/2016 0426   Lab Results  Component Value Date   CHOL 126 08/21/2016   HDL 47.30 08/21/2016   LDLCALC 56 08/21/2016   TRIG 113.0 08/21/2016   CHOLHDL 3 08/21/2016   Lab Results  Component Value Date   HGBA1C 7.7 12/22/2016   Lab Results  Component Value Date   VITAMINB12 246 11/20/2016   Lab Results  Component Value Date   TSH 2.36 05/22/2016    09/22/14 MRI brain and IAC 1. Mild perisylvian and moderate mesial temporal atrophy. 2. Mild periventricular and subcortical foci of chronic small vessel ischemic disease. 3. Thin cut views of internal auditory canals, semicircular canals, brainstem structures are normal. 4. No acute findings.  09/22/14 MRI orbits - Unremarkable MRI orbits (with and without). Right scleral banding noted. No abnormal enhancing, inflammatory or compressive lesions are seen.  07/17/16 MRI brain 1. Stable atrophy and moderate diffuse white matter disease. This likely reflects the sequela of chronic microvascular ischemia.  2. Remote lacunar infarcts of the basal ganglia bilaterally are stable.     ASSESSMENT AND PLAN  77 y.o. year old male here with mild cognitive impairment since past 1-2 years, without significant effect on his day-to-day life.  Most likely represents MCI.  Reviewed diagnosis, prognosis and treatment options.  Patient does have mesial temporal atrophy on MRI from 2016, which could increase potential for him to progress to neurodegenerative process such as dementia, but this is not certain.   Dx: mild cognitive impairement  1.  Word finding difficulty  2. Mild memory disturbance     PLAN:  - encouraged brain healthy activities - consider increase in safety and supervision - hold off on medications at this time; may consider neuropsych testing in future  Return in about 1 year (around 05/23/2018).    Penni Bombard, MD 06/18/1989, 44:45 AM Certified in Neurology, Neurophysiology and Neuroimaging  [facility] [address], [place], [place] [address] [phone]

## 2017-05-24 ENCOUNTER — Ambulatory Visit: Payer: Medicare Other | Admitting: Endocrinology

## 2017-05-25 ENCOUNTER — Ambulatory Visit: Payer: Medicare Other | Admitting: Neurology

## 2017-05-28 DIAGNOSIS — L851 Acquired keratosis [keratoderma] palmaris et plantaris: Secondary | ICD-10-CM | POA: Diagnosis not present

## 2017-05-28 DIAGNOSIS — E1151 Type 2 diabetes mellitus with diabetic peripheral angiopathy without gangrene: Secondary | ICD-10-CM | POA: Diagnosis not present

## 2017-05-28 DIAGNOSIS — B351 Tinea unguium: Secondary | ICD-10-CM | POA: Diagnosis not present

## 2017-05-28 DIAGNOSIS — M79675 Pain in left toe(s): Secondary | ICD-10-CM | POA: Diagnosis not present

## 2017-05-28 DIAGNOSIS — M79674 Pain in right toe(s): Secondary | ICD-10-CM | POA: Diagnosis not present

## 2017-06-11 ENCOUNTER — Encounter: Payer: Self-pay | Admitting: Endocrinology

## 2017-06-11 ENCOUNTER — Ambulatory Visit (INDEPENDENT_AMBULATORY_CARE_PROVIDER_SITE_OTHER): Payer: Medicare Other | Admitting: Endocrinology

## 2017-06-11 ENCOUNTER — Encounter: Payer: Medicare Other | Attending: Endocrinology | Admitting: Nutrition

## 2017-06-11 VITALS — BP 146/67 | HR 62 | Ht 70.0 in | Wt 228.6 lb

## 2017-06-11 DIAGNOSIS — T383X5A Adverse effect of insulin and oral hypoglycemic [antidiabetic] drugs, initial encounter: Secondary | ICD-10-CM

## 2017-06-11 DIAGNOSIS — E1165 Type 2 diabetes mellitus with hyperglycemia: Secondary | ICD-10-CM | POA: Diagnosis not present

## 2017-06-11 DIAGNOSIS — I10 Essential (primary) hypertension: Secondary | ICD-10-CM | POA: Diagnosis not present

## 2017-06-11 DIAGNOSIS — E119 Type 2 diabetes mellitus without complications: Secondary | ICD-10-CM

## 2017-06-11 DIAGNOSIS — Z794 Long term (current) use of insulin: Secondary | ICD-10-CM | POA: Diagnosis not present

## 2017-06-11 DIAGNOSIS — E11319 Type 2 diabetes mellitus with unspecified diabetic retinopathy without macular edema: Secondary | ICD-10-CM

## 2017-06-11 DIAGNOSIS — D508 Other iron deficiency anemias: Secondary | ICD-10-CM

## 2017-06-11 DIAGNOSIS — E538 Deficiency of other specified B group vitamins: Secondary | ICD-10-CM

## 2017-06-11 DIAGNOSIS — E160 Drug-induced hypoglycemia without coma: Secondary | ICD-10-CM | POA: Diagnosis not present

## 2017-06-11 LAB — BASIC METABOLIC PANEL
BUN: 24 mg/dL — ABNORMAL HIGH (ref 6–23)
CHLORIDE: 106 meq/L (ref 96–112)
CO2: 26 meq/L (ref 19–32)
Calcium: 9.3 mg/dL (ref 8.4–10.5)
Creatinine, Ser: 1.23 mg/dL (ref 0.40–1.50)
GFR: 60.7 mL/min (ref >60.00)
GLUCOSE: 110 mg/dL — AB (ref 70–99)
POTASSIUM: 4.3 meq/L (ref 3.5–5.1)
SODIUM: 140 meq/L (ref 135–145)

## 2017-06-11 LAB — LIPID PANEL
Cholesterol: 117 mg/dL (ref 0–200)
HDL: 48.7 mg/dL (ref >39.00)
LDL CALC: 48 mg/dL (ref 0–99)
NonHDL: 68.38
TRIGLYCERIDES: 103 mg/dL (ref 0.0–149.0)
Total CHOL/HDL Ratio: 2
VLDL: 20.6 mg/dL (ref 0.0–40.0)

## 2017-06-11 LAB — POCT GLUCOSE (DEVICE FOR HOME USE): POC Glucose: 112 mg/dl — AB (ref 70–99)

## 2017-06-11 LAB — TSH: TSH: 2.6 u[IU]/mL (ref 0.35–4.50)

## 2017-06-11 LAB — CBC
HEMATOCRIT: 33.9 % — AB (ref 39.0–52.0)
Hemoglobin: 11.5 g/dL — ABNORMAL LOW (ref 13.0–17.0)
MCHC: 33.8 g/dL (ref 30.0–36.0)
MCV: 91.8 fl (ref 78.0–100.0)
Platelets: 260 10*3/uL (ref 150.0–400.0)
RBC: 3.69 Mil/uL — AB (ref 4.22–5.81)
RDW: 13.3 % (ref 11.5–15.5)
WBC: 6.6 10*3/uL (ref 4.0–10.5)

## 2017-06-11 LAB — VITAMIN B12: VITAMIN B 12: 258 pg/mL (ref 211–911)

## 2017-06-11 LAB — IBC PANEL
IRON: 66 ug/dL (ref 42–165)
SATURATION RATIOS: 21 % (ref 20.0–50.0)
TRANSFERRIN: 225 mg/dL (ref 212.0–360.0)

## 2017-06-11 LAB — POCT GLYCOSYLATED HEMOGLOBIN (HGB A1C): Hemoglobin A1C: 6.9

## 2017-06-11 LAB — T4, FREE: Free T4: 0.92 ng/dL (ref 0.60–1.60)

## 2017-06-11 MED ORDER — INSULIN GLARGINE 300 UNIT/ML ~~LOC~~ SOPN: 14.0000 [IU] | PEN_INJECTOR | Freq: Every day | SUBCUTANEOUS | 3 refills | Status: DC

## 2017-06-11 NOTE — Patient Instructions (Addendum)
TOUJEO  insulin: This insulin provides blood sugar control for up to 24 hours.  .  Start with 14 units at evening time once daily   He will need to increase by 2 units every 3 days until the waking up sugars are under 140 . Then continue the same dose.  If blood sugar is under 90 for 2 days in a row, reduce the dose by 2 units  Continue METFORMIN, 3 tablets daily  Call if blood sugars are going over 200 after meals, may need to inject NovoLog if blood sugars are going up after eating  .  Make sure you bring her meter to each visit

## 2017-06-11 NOTE — Progress Notes (Signed)
Patient ID: Jonathan Hensley, male   DOB: Nov 30, 1940, 77 y.o.   MRN: [id number]   Reason for Appointment: follow-up of blood sugar problems  History of Present Illness    Type 2 diabetes mellitus, date of diagnosis: 1994.   He has been on insulin for the last few years, starting with only bedtime NPH and subsequently progressing to basal bolus regimen Also taking glipizide and metformin long-term  The HbgA1c was highest in 2013 at 8.2 Because of more fluctuation in his blood sugars at all times, difficulty with compliance and inconvenience of the multiple insulin dose regimen as well as needing higher doses of insulin he was started on the V.-go pump in 05/2013  Recent history:   The insulin regimen is: basal rate of 20 units on V- go pump; boluses with carbohydrate coverage 1:10; usually 6-8 units 2-3 times a day   His A1c has been consistently over 7% and now is lower than usual at 6.9  Current blood sugar patterns and problems identified:  He did not bring his monitor for download again  He walked into the office saying that he is having problems with his blood sugars  Because of his memory difficulties he cannot give a good history about what's going on  Apparently yesterday before lunchtime he felt a little woozy or funny feeling in his head and his blood sugar was 65.  He says he ate a candy bar and did not feel better right away but in the evening his blood sugar was still around 80 after supper, probably did not take any bolus clicks at dinnertime because of relatively low blood sugar  His blood sugar was apparently 245 before breakfast this morning about 2 hours ago and an hour later it was 80 at home  He does not think his morning blood sugars are usually low and are about 130   He is usually taking 2-3 clicks for his meals and yesterday he thinks he had a toast and eggs and bacon and today also he had 2 waffles with eggs  Does not know what his blood sugars are  usually around suppertime or at night  He is taking his metformin consistently as before 3 times a day  Also changing his pump consistently at the same time in the evenings . He knows how to count carbohydrates usually Difficulties with medication adherence: Bolusing for meals after eating at times, not covering snacks or desserts with boluses   Glucose monitoring: Freestyle, Blood Glucose readings not available     Wt Readings from Last 3 Encounters:  06/11/17 228 lb 9.6 oz (103.7 kg)  05/23/17 224 lb (101.6 kg)  02/21/17 223 lb 3.2 oz (101.2 kg)   Meals: 2-3 meals per day and sometimes no lunch.    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 Always use your most recent med list.          benazepril 40 MG tablet Commonly known as:  LOTENSIN Take 40 mg by mouth at bedtime.   ELIQUIS 5 MG Tabs tablet Generic drug:  apixaban Take 5 mg by mouth 2 (two) times daily.   ezetimibe 10 MG tablet Commonly known as:  ZETIA Take 10 mg by mouth at bedtime.   freestyle lancets Use as instructed to check blood sugar 4 times per day dx code 250.00   FREESTYLE LITE test strip Generic drug:  glucose blood USE TO CHECK BLOOD SUGAR FOUR TIMES A DAY AS INSTRUCTED   Insulin Glargine 300 UNIT/ML Sopn Commonly known  as:  TOUJEO SOLOSTAR Inject 14 Units into the skin daily.   metFORMIN 500 MG 24 hr tablet Commonly known as:  GLUCOPHAGE-XR Take 3 tablets (1,500 mg total) by mouth daily with supper.   NOVOLOG 100 UNIT/ML injection Generic drug:  insulin aspart USE UP TO 56 UNITS IN V-GO PUMP DAILY AS DIRECTED   simvastatin 20 MG tablet Commonly known as:  ZOCOR Take 20 mg by mouth at bedtime.   V-GO 20 Kit USE ONE PER DAY       Allergies:  Allergies  Allergen Reactions  . Other Other (See Comments)    Beta or alpha blockers: severe dizziness  . Tramadol Other (See Comments)    Dizziness     Past Medical History:  Diagnosis Date  . Arthritis    rheumatoid in his hands  . Atrial fibrillation ([place])   . Bulging lumbar disc   . Cancer ([place])   . DDD (degenerative disc disease), cervical   . Diabetes mellitus   . Diabetic retinopathy ([place])   . Double vision   . Dysrhythmia    Atrial Fibrillation  . History of prostate cancer   . Hypertension   . Low back pain   . Nephrolithiasis   . Sleep apnea    uses cpap    Past Surgical History:  Procedure Laterality Date  . APPENDECTOMY    . BACK SURGERY    . Bilateral L4-5 lumbar decompression including bilateral  11/2008  . brachytherapy    . COLONOSCOPY    . CYSTOSCOPY    . Left L4-L5 lumbar laminotomy and microdiskectomy with  07/14/2008    . LITHOTRIPSY    . LUMBAR LAMINECTOMY/DECOMPRESSION MICRODISCECTOMY Left 08/18/2013   Procedure: Left Lumbar Five-Sacral One Microdiskectomy;  Surgeon: [name], MD;  Location: [facility];  Service: Neurosurgery;  Laterality: Left;  Left Lumbar Five-Sacral One Microdiskectomy  . rotator cuff surgery    . Scleral buckle, laser photocoagulation, and anterior chamber  01/04/2009    Family History  Problem Relation Age of Onset  . Heart failure Father   . Heart failure Mother   . Heart failure Brother        Half brother.  . Colon cancer Neg Hx     Social History:  reports that  he quit smoking about 44 years ago. he has never used smokeless tobacco. He reports that he does not drink alcohol or use drugs.  Review of Systems  Following is a copy of the previous note  He has been referred to the neurologist because of difficulty with word finding and continuing his sentence.  He refuses to take the Aricept because it had a side effect of dizziness and he was told there is no other medication However he still has difficulty remembering Offered him to take Exelon but since it has a side effect of potential dizziness he will not take this     Hypertension:  Blood pressure is being treated  with benazepril 40 mg, also followed by cardiologist  He was asked to start HCTZ but he says he did not take it because he wanted to have his back problem treated first  Currently checking at home with the new wrist instrument, he thinks recently blood pressure was 135/77   BP Readings from Last 3 Encounters:  06/11/17 (!) 146/67  05/23/17 (!) 162/77  02/21/17 140/86     He is taking sodium bicarbonate for his history of kidney stones from urologist  HYPERLIPIDEMIA: Has been on simvastatin and Zetia with good results    Lab Results  Component Value Date   CHOL 126 08/21/2016   HDL 47.30 08/21/2016   LDLCALC 56 08/21/2016   TRIG 113.0 08/21/2016   CHOLHDL 3 08/21/2016     He has had retinal detachment treated surgically in the past, followed by ophthalmologist regularly , Has decreased vision in the left eye   Sleep apnea, is on CPAP   Anemia :mild and stable  Last   Iron saturation was normal . He was taking OTC B-12 But for sometime has not taken this, he says he does not like to take a lot of medications    Lab Results  Component Value Date   WBC 6.7 11/20/2016   HGB 12.3 (L) 11/20/2016   HCT 37.0 (L) 11/20/2016   MCV 91.4 11/20/2016   PLT 256.0 11/20/2016     Examination:   BP (!) 146/67 (BP Location: Left Arm, Patient Position: Sitting, Cuff Size:  Large)   Pulse 62   Ht 5' 10"  (1.778 m)   Wt 228 lb 9.6 oz (103.7 kg)   BMI 32.80 kg/m   Body mass index is 32.8 kg/m.     Assesment:    Hypertension: His blood pressure is  high today but he is anxious today because of getting late for his appointment  He will not take HCTZ as he thinks his blood pressure is improving and he will not take any medication before his potential back surgery   Diabetes type 2  See history of present illness for  discussion of current blood sugar patterns, problems identified and day-to-day management  He is reporting tendency to low blood sugars recently although lowest blood sugar reported is 65 at lunch yesterday Without his blood sugar meter difficult to get a good history of his blood sugar patterns  Most of his tendency to lower blood sugars appears to be after meals although he thinks his blood sugar was relatively low before dinner yesterday also  Not clear why he is having increased sensitivity to insulin as previously A1c usually is 7-7.4 at least and now 6.9 He has not changed his diet, in fact his weight is up slightly  Since he has tendency to hypoglycemia as above will take him off the V-go pump and start Melvina Discussed in detail the nature of basal insulin, use of the insulin pen, timing of ejection, titration based on fasting blood sugars after every 3 days Will not start mealtime insulin as yet but continue metformin His instructions were reviewed by her diabetes educator and reinforced Emphasized that he has to  bring his monitor consistently on each visit  Also will need to evaluate his renal function and rule out hypothyroidism as a cause of his insulin sensitivity  Will follow-up next week again  2.  History of anemia: Will follow-up next week  3.  Hypertension: Fair control, blood pressure somewhat higher in the office usually  4.   MEMORY deficit: He will follow-up with his neurologist   Counseling time on subjects  discussed in assessment and plan sections is over 50% of today's 25 minute visit  Patient Instructions  TOUJEO  insulin: This insulin provides blood sugar control for up to 24 hours.  .  Start with 14 units at evening time once daily   He will need to increase by 2 units every 3 days until the waking up sugars are under 140 . Then continue the same dose.  If blood sugar is under 90 for 2 days in a row, reduce the dose by 2 units  Continue METFORMIN, 3 tablets daily  Call if blood sugars are going over 200 after meals, may need to inject NovoLog if blood sugars are going up after eating  .  Make sure you bring her meter to each visit        [name] 06/11/2017, 1:29 PM

## 2017-06-12 ENCOUNTER — Other Ambulatory Visit: Payer: Self-pay

## 2017-06-12 ENCOUNTER — Telehealth: Payer: Self-pay

## 2017-06-12 MED ORDER — INSULIN PEN NEEDLE 31G X 5 MM MISC: 2 refills | Status: DC

## 2017-06-12 MED ORDER — INSULIN GLARGINE 100 UNITS/ML SOLOSTAR PEN: PEN_INJECTOR | SUBCUTANEOUS | 2 refills | Status: DC

## 2017-06-12 NOTE — Telephone Encounter (Signed)
lantus was sent to replace toujeo that is not covered

## 2017-06-14 ENCOUNTER — Telehealth: Payer: Self-pay

## 2017-06-14 ENCOUNTER — Other Ambulatory Visit: Payer: Self-pay | Admitting: Endocrinology

## 2017-06-14 DIAGNOSIS — E1165 Type 2 diabetes mellitus with hyperglycemia: Secondary | ICD-10-CM

## 2017-06-14 DIAGNOSIS — Z794 Long term (current) use of insulin: Principal | ICD-10-CM

## 2017-06-14 NOTE — Telephone Encounter (Signed)
What was his blood sugar this morning; and he will need to check his blood glucose closely the rest of the day He can switch to taking the Toujeo in the morning from now on

## 2017-06-14 NOTE — Telephone Encounter (Signed)
Patient called and stated he made a mistake and accidentally took his 14 units of toujeo this morning and he is worried something bad is going to happen please advise

## 2017-06-14 NOTE — Telephone Encounter (Signed)
Spoke with the patient and he is aware of the note below

## 2017-06-18 ENCOUNTER — Ambulatory Visit (INDEPENDENT_AMBULATORY_CARE_PROVIDER_SITE_OTHER): Payer: Medicare Other | Admitting: Endocrinology

## 2017-06-18 VITALS — BP 163/77 | Ht 70.0 in | Wt 219.8 lb

## 2017-06-18 DIAGNOSIS — E1165 Type 2 diabetes mellitus with hyperglycemia: Secondary | ICD-10-CM | POA: Diagnosis not present

## 2017-06-18 DIAGNOSIS — Z794 Long term (current) use of insulin: Secondary | ICD-10-CM

## 2017-06-18 MED ORDER — METFORMIN HCL ER 500 MG PO TB24: 1500.0000 mg | ORAL_TABLET | Freq: Every day | ORAL | 3 refills | Status: DC

## 2017-06-18 MED ORDER — INSULIN ASPART 100 UNIT/ML FLEXPEN: PEN_INJECTOR | SUBCUTANEOUS | 2 refills | Status: DC

## 2017-06-18 NOTE — Progress Notes (Signed)
Patient ID: Jonathan Hensley, male   DOB: 1941/04/05, 77 y.o.   MRN: [id number]   Reason for Appointment: follow-up of blood sugar problems  History of Present Illness    Type 2 diabetes mellitus, date of diagnosis: 1994.   He has been on insulin for the last few years, starting with only bedtime NPH and subsequently progressing to basal bolus regimen Also taking glipizide and metformin long-term  The HbgA1c was highest in 2013 at 8.2 Because of more fluctuation in his blood sugars at all times, difficulty with compliance and inconvenience of the multiple insulin dose regimen as well as needing higher doses of insulin he was started on the V.-go pump in 05/2013  Recent history:   The insulin regimen is: 18 Toujeo in the morning  Previous regimen: Basal rate of 20 units on V- go pump; boluses with carbohydrate coverage 1:10; usually 6-8 units 2-3 times a day   His A1c has been consistently over 7% and recently is lower than usual at 6.9  Current blood sugar patterns and problems identified:  He did bring his monitor for download   Since he was having hypoglycemia tendency on the hospital V-go-pump he was switched to basal insulin only  Because of his difficulty with memory and comprehension he was started only on one kind of insulin for  He is however able to use the flow sheet given to gradually adjust the dose from the 14 units starting dose  FASTING readings are much higher than expected even with going up to 18 units of Toujeo yesterday and they are well over 200  Postprandial readings are as high as 373 depending on his meal size  He thinks he is mostly eating 2 meals a day and not much at lunch usually  Not clear why his weight appears to be lower today than before   he continues to take metformin ER and no side effects with this currently  Even though he had done well with the pump he thinks that he does not like it anymore and it is uncomfortable and will not  use it despite having a large supply at home . He knows how to count carbohydrates usually Difficulties with medication adherence: Bolusing for meals after eating at times, not covering snacks or desserts with boluses   Glucose monitoring: Freestyle, Blood Glucose readings  Recent FASTING range 189-283 Recent nonfasting 214-382   AVERAGE 220, previously was averaging 132   Wt Readings from Last 3 Encounters:  06/18/17 219 lb 12.8 oz (99.7 kg)  06/11/17 228 lb 9.6 oz (103.7 kg)  05/23/17 224 lb (101.6 kg)   Meals: 2-3 meals per day and sometimes no lunch.    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 Always use your most recent med list.          benazepril 40 MG tablet Commonly known as:  LOTENSIN Take 40 mg by mouth at bedtime.   ELIQUIS 5 MG Tabs tablet Generic drug:  apixaban Take 5 mg by mouth 2 (two) times daily.   ezetimibe 10 MG tablet Commonly known as:  ZETIA Take 10 mg by mouth at bedtime.   freestyle lancets Use as instructed to check blood sugar 4 times per day dx code 250.00   FREESTYLE LITE test strip Generic drug:  glucose blood USE TO CHECK BLOOD SUGAR FOUR TIMES A DAY AS INSTRUCTED   insulin glargine 100 unit/mL Sopn Commonly known as:  LANTUS Inject 14 units daily   Insulin  Pen Needle 31G X 5 MM Misc Use to inject insulin once daily   metFORMIN 500 MG 24 hr tablet Commonly known as:  GLUCOPHAGE-XR Take 3 tablets (1,500 mg total) by mouth daily with supper.   simvastatin 20 MG tablet Commonly known as:  ZOCOR Take 20 mg by mouth at bedtime.       Allergies:  Allergies  Allergen Reactions  . Other Other (See Comments)    Beta or alpha blockers: severe dizziness  . Tramadol Other (See Comments)    Dizziness     Past Medical History:  Diagnosis Date  . Arthritis    rheumatoid in his hands  . Atrial fibrillation ([place])   . Bulging lumbar disc   . Cancer ([place])   . DDD (degenerative disc disease), cervical   . Diabetes mellitus   . Diabetic retinopathy ([place])   . Double vision   . Dysrhythmia    Atrial Fibrillation  . History of prostate cancer   . Hypertension   . Low back pain   . Nephrolithiasis   . Sleep apnea    uses cpap    Past Surgical History:  Procedure Laterality Date  . APPENDECTOMY    . BACK SURGERY    . Bilateral L4-5 lumbar decompression including bilateral  11/2008  . brachytherapy    . COLONOSCOPY    . CYSTOSCOPY    . Left L4-L5 lumbar laminotomy and microdiskectomy with  07/14/2008    . LITHOTRIPSY    . LUMBAR LAMINECTOMY/DECOMPRESSION MICRODISCECTOMY Left 08/18/2013   Procedure: Left Lumbar Five-Sacral One Microdiskectomy;  Surgeon: [name], MD;  Location: [facility];  Service: Neurosurgery;  Laterality: Left;  Left Lumbar Five-Sacral One Microdiskectomy  . rotator cuff surgery    . Scleral buckle, laser photocoagulation, and anterior chamber  01/04/2009    Family History  Problem Relation Age of Onset  . Heart failure Father   . Heart failure Mother   . Heart failure Brother        Half brother.  . Colon cancer Neg Hx     Social History:  reports that he quit smoking about 44 years ago. he has never used smokeless tobacco. He reports that he does not drink alcohol or use drugs.  Review of  Systems  Following is a copy of the previous note  He has been referred to the neurologist because of difficulty with word finding and continuing his sentence. He refuses to take the Aricept because it had a side effect of dizziness and he was told there is no other medication However he still has difficulty remembering Offered him to take Exelon but since  it has a side effect of potential dizziness he will not take this     Hypertension:  Blood pressure is being treated  with benazepril 40 mg, also followed by cardiologist  He was asked to start HCTZ but he says he did not take it because he wanted to have his back problem treated first  Currently checking at home with the new wrist instrument, he thinks recently blood pressure was 135/77   BP Readings from Last 3 Encounters:  06/18/17 (!) 163/77  06/11/17 (!) 146/67  05/23/17 (!) 162/77     He is taking sodium bicarbonate for his history of kidney stones from urologist  HYPERLIPIDEMIA: Has been on simvastatin and Zetia with good results    Lab Results  Component Value Date   CHOL 117 06/11/2017   HDL 48.70 06/11/2017   LDLCALC 48 06/11/2017   TRIG 103.0 06/11/2017   CHOLHDL 2 06/11/2017     He has had retinal detachment treated surgically in the past, followed by ophthalmologist regularly  Has decreased vision in the left eye   Sleep apnea, is on CPAP   Anemia :mild and stable  Last   Iron saturation was normal . He was taking OTC B-12 But for sometime has not taken this, he says he does not like to take a lot of medications    Lab Results  Component Value Date   WBC 6.6 06/11/2017   HGB 11.5 (L) 06/11/2017   HCT 33.9 (L) 06/11/2017   MCV 91.8 06/11/2017   PLT 260.0 06/11/2017     Examination:   BP (!) 163/77 (BP Location: Left Arm, Patient Position: Sitting, Cuff Size: Large)   Ht 5\' 10"  (1.778 m)   Wt 219 lb 12.8 oz (99.7 kg)   BMI 31.54 kg/m   Body mass index is 31.54 kg/m.     Assesment:      Diabetes type 2  See history of present illness for  discussion of current blood sugar patterns, problems identified and day-to-day management  As discussed above his insulin requirement appears to be higher with injections compared to the V-go pump Blood sugars are well over 200 most of the time and higher after meals  Today discussed in detail the need for mealtime insulin to cover postprandial spikes, action of mealtime insulin, use of the insulin pen, timing and action of the rapid acting insulin as well as starting dose and dosage titration to target the two-hour reading of under 180  He has difficulty understanding the need for multiple injections and mealtime insulin even though he had been used to doing boluses with the pump I have also discussed that we need to continue titrating the TOUJEO insulin For now will increase the dose by at least 4 units, advised him that this should help his control overnight Despite discussing the advantages of the pump he completely refuses to continue using this  HYPERTENSION: Will follow up on next visit, he is somewhat anxious today  Counseling time on subjects discussed in assessment and plan sections is over 50% of today's 25 minute visit  There are no Patient Instructions on file for this visit.   [name] 06/18/2017, 10:17 AM

## 2017-06-18 NOTE — Patient Instructions (Addendum)
TOUJEO 22 UNITS IN AM  Novolog BEFORE Breakfast take 5 units and at 7 units at dinner   If eating lunch also will need 5 units  It sugar going below 120 AFTER MEALS then reduce Novolog by 2 uniuts for that meal  Lantus same doise as Toujeo

## 2017-06-19 NOTE — Progress Notes (Signed)
Pt. Was trained on how/when to use the Toujeo pen.  Written instructions were  Given to him by [name] to start with 14 units.  We discussed the dosage adjustment schedule, and he reported good understanding of this with no final questions.  He reports that he does not like wearing the V-Go, and that he thinks he will like this much better.   We also reviewed low blood sugars--symptoms and treatment.  He reported good understanding of this, with no final questions.

## 2017-06-19 NOTE — Patient Instructions (Addendum)
Take 14u of Toujeo once a day.  Stop the V-go Increase the dose of Toujeo every 3 days by 2u if FBSs for last 3 days are over 140. Call if questions.

## 2017-06-22 ENCOUNTER — Telehealth: Payer: Self-pay | Admitting: Endocrinology

## 2017-06-22 NOTE — Telephone Encounter (Signed)
He can start back on the V-go pump and please find out if he has some at home

## 2017-06-22 NOTE — Telephone Encounter (Signed)
Please advise 

## 2017-06-22 NOTE — Telephone Encounter (Signed)
Pt is aware and he has "plenty" at home

## 2017-06-22 NOTE — Telephone Encounter (Signed)
Patient want to start back taking the Vgo 20, nothing else is working for him. Patient want a call back today.

## 2017-06-26 ENCOUNTER — Other Ambulatory Visit: Payer: Self-pay

## 2017-07-04 ENCOUNTER — Other Ambulatory Visit: Payer: Self-pay

## 2017-07-04 MED ORDER — METFORMIN HCL ER 500 MG PO TB24: 1500.0000 mg | ORAL_TABLET | Freq: Every day | ORAL | 3 refills | Status: DC

## 2017-07-04 NOTE — Telephone Encounter (Signed)
Sent!

## 2017-07-04 NOTE — Telephone Encounter (Signed)
°  Pt needs refill sent for.  metFORMIN (GLUCOPHAGE-XR) 500 MG 24 hr tablet      [place], [place]

## 2017-07-18 ENCOUNTER — Ambulatory Visit (INDEPENDENT_AMBULATORY_CARE_PROVIDER_SITE_OTHER): Payer: Medicare Other | Admitting: Endocrinology

## 2017-07-18 ENCOUNTER — Other Ambulatory Visit: Payer: Self-pay | Admitting: Endocrinology

## 2017-07-18 ENCOUNTER — Encounter: Payer: Self-pay | Admitting: Endocrinology

## 2017-07-18 VITALS — BP 122/70 | HR 65 | Ht 70.0 in | Wt 219.0 lb

## 2017-07-18 DIAGNOSIS — E1165 Type 2 diabetes mellitus with hyperglycemia: Secondary | ICD-10-CM | POA: Diagnosis not present

## 2017-07-18 DIAGNOSIS — D508 Other iron deficiency anemias: Secondary | ICD-10-CM

## 2017-07-18 DIAGNOSIS — E538 Deficiency of other specified B group vitamins: Secondary | ICD-10-CM | POA: Diagnosis not present

## 2017-07-18 DIAGNOSIS — Z794 Long term (current) use of insulin: Secondary | ICD-10-CM

## 2017-07-18 NOTE — Progress Notes (Signed)
Patient ID: Jonathan Hensley, male   DOB: June 12, 1940, 77 y.o.   MRN: [id number]   Reason for Appointment: follow-up of blood sugar problems  History of Present Illness    Type 2 diabetes mellitus, date of diagnosis: 1994.   He has been on insulin for the last few years, starting with only bedtime NPH and subsequently progressing to basal bolus regimen Also taking glipizide and metformin long-term  The HbgA1c was highest in 2013 at 8.2 Because of more fluctuation in his blood sugars at all times, difficulty with compliance and inconvenience of the multiple insulin dose regimen as well as needing higher doses of insulin he was started on the V.-go pump in 05/2013  Recent history:   The insulin regimen is: 18 [place] in the morning  Previous regimen: Basal rate of 20 units on V- go pump; boluses with carbohydrate coverage 1:10; usually 4 units 2-3 times a day   His A1c has been consistently over 7% and recently is lower than usual at 6.9  Current blood sugar patterns and problems identified:  He did bring his freestyle monitor for download   He was tried on basal bolus insulin because of tendency to hypoglycemia with the V-go pump overnight  However he could not follow the instructions and blood sugars were poorly controlled with this regimen in February and blood sugars were averaging 220  He has gone back to the V-go pump before  Although his blood sugars are considerably better he is starting to get low normal readings fasting and he thinks he may be getting symptoms of low sugars with this  Also has a low normal reading in the early afternoon on Sunday  Blood sugars at his dinnertime and after dinner are variable based on what he is eating during the day and evening  Currently not counting carbohydrates or adjusting his boluses based on what he is eating since he cannot follow instructions for this  However postprandial readings are averaging about 170  He is eating a  bowl of cereal at bedtime for a snack without a bolus also  He is starting to do a little more activity but no formal exercise  He thinks he is taking his Metformin 1500 mg consistently  Also he thinks he is changing his V-go pump daily .  Difficulties with medication adherence: Bolusing for meals after eating at times, not covering snacks or desserts with boluses   Glucose monitoring: Freestyle and now checking nearly 3 times a day  Blood Glucose readings from download  Mean values apply above for all meters except median for One Touch  PRE-MEAL Fasting Lunch Dinner Bedtime Overall  Glucose range: 67-199   88-214    Mean/median:  122    147   POST-MEAL PC Breakfast PC Lunch PC Dinner  Glucose range:   69-187  114-246  Mean/median:    170    Wt Readings from Last 3 Encounters:  07/18/17 219 lb (99.3 kg)  06/18/17 219 lb 12.8 oz (99.7 kg)  06/11/17 228 lb 9.6 oz (103.7 kg)   Meals: 2-3 meals per day and sometimes no lunch.    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 Always use your most recent med list.          benazepril 40 MG tablet Commonly known as:  LOTENSIN Take 40 mg by mouth at bedtime.   ELIQUIS 5 MG Tabs tablet Generic drug:  apixaban Take 5 mg by mouth 2 (two) times daily.     ezetimibe 10 MG tablet Commonly known as:  ZETIA Take 10 mg by mouth at bedtime.   freestyle lancets Use as instructed to check blood sugar 4 times per day dx code 250.00   FREESTYLE LITE test strip Generic drug:  glucose blood USE TO CHECK BLOOD SUGAR FOUR TIMES A DAY AS INSTRUCTED   metFORMIN 500 MG 24 hr tablet Commonly known as:  GLUCOPHAGE-XR Take 3 tablets (1,500 mg total) by mouth daily with supper.   simvastatin 20 MG tablet Commonly known as:  ZOCOR Take 20 mg by mouth at bedtime.   V-GO 20 Kit       Allergies:  Allergies  Allergen Reactions  . Other Other (See Comments)    Beta or alpha blockers: severe dizziness  . Tramadol Other (See Comments)    Dizziness     Past Medical History:  Diagnosis Date  . Arthritis    rheumatoid in his hands  . Atrial fibrillation ([place])   . Bulging lumbar disc   . Cancer ([place])   . DDD (degenerative disc disease), cervical   . Diabetes mellitus   . Diabetic retinopathy ([place])   . Double vision   . Dysrhythmia    Atrial Fibrillation  . History of prostate cancer   . Hypertension   . Low back pain   . Nephrolithiasis   . Sleep apnea    uses cpap    Past Surgical History:  Procedure Laterality Date  . APPENDECTOMY    . BACK SURGERY    . Bilateral L4-5 lumbar decompression including bilateral  11/2008  . brachytherapy    . COLONOSCOPY    . CYSTOSCOPY    . Left L4-L5 lumbar laminotomy and microdiskectomy with  07/14/2008    . LITHOTRIPSY    . LUMBAR LAMINECTOMY/DECOMPRESSION MICRODISCECTOMY Left 08/18/2013   Procedure: Left Lumbar Five-Sacral One Microdiskectomy;  Surgeon: [name], MD;  Location: [facility];  Service: Neurosurgery;  Laterality: Left;  Left Lumbar Five-Sacral One Microdiskectomy  . rotator cuff surgery    . Scleral buckle, laser photocoagulation, and anterior chamber  01/04/2009    Family History  Problem Relation Age of Onset  . Heart failure Father   . Heart failure Mother   .  Heart failure Brother        Half brother.  . Colon cancer Neg Hx     Social History:  reports that he quit smoking about 44 years ago. he has never used smokeless tobacco. He reports that he does not drink alcohol or use drugs.  Review of Systems    He has been referred to the neurologist because  of difficulty with word finding and continuing his sentence. He refuses to take the Aricept because it had a side effect of dizziness and he was told there is no other medication However he still has difficulty remembering  He was seen for second opinion in 1/19 and no pharmacological treatment recommended     Hypertension:  Blood pressure is being treated  with benazepril 40 mg, also followed by cardiologist   He is checking at home and he thinks blood pressure is about the same at home also    BP Readings from Last 3 Encounters:  07/18/17 122/70  06/18/17 (!) 163/77  06/11/17 (!) 146/67     He is taking sodium bicarbonate for his history of kidney stones from urologist  HYPERLIPIDEMIA: Has been on simvastatin and Zetia with the following results:  Lab Results  Component Value Date   CHOL 117 06/11/2017   HDL 48.70 06/11/2017   [place] 48 06/11/2017   TRIG 103.0 06/11/2017   CHOLHDL 2 06/11/2017     He has had retinal detachment treated surgically in the past, followed by ophthalmologist regularly  Has decreased vision in the left eye   Sleep apnea, better on CPAP   Anemia :  This has been persistent and somewhat variable  Last   Iron saturation was normal . He was reminded about taking OTC B-12 on his last visit when he had some increased anemia   Lab Results  Component Value Date   WBC 6.6 06/11/2017   HGB 11.5 (L) 06/11/2017   HCT 33.9 (L) 06/11/2017   MCV 91.8 06/11/2017   PLT 260.0 06/11/2017   Lab Results  Component Value Date   VITAMINB12 258 06/11/2017     Examination:   BP 122/70 (BP Location: Left Arm, Patient Position: Sitting, Cuff Size:  Large)   Pulse 65   Ht _0  (1.778 m)   Wt 219 lb (99.3 kg)   SpO2 98%   BMI 31.42 kg/m   Body mass index is 31.42 kg/m.     Assesment:     Diabetes type 2  See history of present illness for  discussion of current blood sugar patterns, problems identified and day-to-day management  As discussed above his blood sugars are much better with using the V-go pump again More recently has started getting low normal fasting readings indicating excessive basal insulin However he is not able to take insulin with multiple injections because of compliance issues Blood sugars are variable in the evenings and based on his diet and not clear if he is always compliant with his boluses  Discussed need to somewhat adjust the boluses based on pre-meal blood sugar or meal size but he probably will not understand this or remember to do it However discussed that we can do the following to reduce overnight hypoglycemia at least for now  Take only 1 metformin daily  Stop eating cereal at bedtime and use peanut butter crackers or other protein  Also reminded him that he will let us know if he continues to have low sugars or symptoms of hypoglycemia especially overnight Discussed that his weight is leveled off and needs to maintain this now  HYPERTENSION: Blood pressure is better today  ANEMIA: Reminded him to take his B12 regularly  Patient Instructions  Reduce METFORMIN to ONE tab daily  If eating more carbs or bigger meals click 3 times  Call if still getting  Eat a snack with protein at bedtime like peanut butter crackers  Take  B 12 daily  Counseling time on subjects discussed in assessment and plan sections is over 50% of today's 25 minute visit   Jonathan Hensley 07/18/2017, 2:39 PM

## 2017-07-18 NOTE — Patient Instructions (Addendum)
Reduce METFORMIN to ONE tab daily  If eating more carbs or bigger meals click 3 times  Call if still getting  Eat a snack with protein at bedtime like peanut butter crackers  Take B 12 daily

## 2017-07-20 ENCOUNTER — Telehealth: Payer: Self-pay | Admitting: Endocrinology

## 2017-07-20 NOTE — Telephone Encounter (Signed)
Please advise 

## 2017-07-20 NOTE — Telephone Encounter (Signed)
Please let him know that this is fine for now.  If his blood sugar is over 150 tomorrow in the morning he can increase metformin to 2 tablets a day, meanwhile stay on one a day

## 2017-07-20 NOTE — Telephone Encounter (Signed)
Patient is calling about blood sugar reading for  Yesterday Before breakfast - 124 This morning before breakfast- 161

## 2017-07-23 DIAGNOSIS — H3561 Retinal hemorrhage, right eye: Secondary | ICD-10-CM | POA: Diagnosis not present

## 2017-07-23 DIAGNOSIS — E113291 Type 2 diabetes mellitus with mild nonproliferative diabetic retinopathy without macular edema, right eye: Secondary | ICD-10-CM | POA: Diagnosis not present

## 2017-07-23 DIAGNOSIS — H35371 Puckering of macula, right eye: Secondary | ICD-10-CM | POA: Diagnosis not present

## 2017-07-23 DIAGNOSIS — E113212 Type 2 diabetes mellitus with mild nonproliferative diabetic retinopathy with macular edema, left eye: Secondary | ICD-10-CM | POA: Diagnosis not present

## 2017-07-24 NOTE — Telephone Encounter (Signed)
Patient notified and verbalized understanding. 

## 2017-08-08 DIAGNOSIS — B351 Tinea unguium: Secondary | ICD-10-CM | POA: Diagnosis not present

## 2017-08-08 DIAGNOSIS — E1151 Type 2 diabetes mellitus with diabetic peripheral angiopathy without gangrene: Secondary | ICD-10-CM | POA: Diagnosis not present

## 2017-08-08 DIAGNOSIS — M79675 Pain in left toe(s): Secondary | ICD-10-CM | POA: Diagnosis not present

## 2017-08-08 DIAGNOSIS — L851 Acquired keratosis [keratoderma] palmaris et plantaris: Secondary | ICD-10-CM | POA: Diagnosis not present

## 2017-08-08 DIAGNOSIS — M79674 Pain in right toe(s): Secondary | ICD-10-CM | POA: Diagnosis not present

## 2017-09-20 ENCOUNTER — Encounter: Payer: Self-pay | Admitting: Endocrinology

## 2017-09-20 ENCOUNTER — Ambulatory Visit (INDEPENDENT_AMBULATORY_CARE_PROVIDER_SITE_OTHER): Payer: Medicare Other | Admitting: Endocrinology

## 2017-09-20 VITALS — BP 140/72 | HR 68 | Ht 70.0 in | Wt 218.2 lb

## 2017-09-20 DIAGNOSIS — Z794 Long term (current) use of insulin: Secondary | ICD-10-CM | POA: Diagnosis not present

## 2017-09-20 DIAGNOSIS — E538 Deficiency of other specified B group vitamins: Secondary | ICD-10-CM | POA: Diagnosis not present

## 2017-09-20 DIAGNOSIS — E1165 Type 2 diabetes mellitus with hyperglycemia: Secondary | ICD-10-CM | POA: Diagnosis not present

## 2017-09-20 DIAGNOSIS — D508 Other iron deficiency anemias: Secondary | ICD-10-CM | POA: Diagnosis not present

## 2017-09-20 DIAGNOSIS — Z8546 Personal history of malignant neoplasm of prostate: Secondary | ICD-10-CM | POA: Diagnosis not present

## 2017-09-20 LAB — IBC PANEL
Iron: 68 ug/dL (ref 42–165)
SATURATION RATIOS: 22.1 % (ref 20.0–50.0)
TRANSFERRIN: 220 mg/dL (ref 212.0–360.0)

## 2017-09-20 LAB — COMPREHENSIVE METABOLIC PANEL
ALT: 9 U/L (ref 0–53)
AST: 11 U/L (ref 0–37)
Albumin: 4 g/dL (ref 3.5–5.2)
Alkaline Phosphatase: 46 U/L (ref 39–117)
BUN: 25 mg/dL — ABNORMAL HIGH (ref 6–23)
CALCIUM: 9.4 mg/dL (ref 8.4–10.5)
CO2: 30 meq/L (ref 19–32)
Chloride: 104 mEq/L (ref 96–112)
Creatinine, Ser: 1.18 mg/dL (ref 0.40–1.50)
GFR: 63.63 mL/min (ref >60.00)
Glucose, Bld: 151 mg/dL — ABNORMAL HIGH (ref 70–99)
Potassium: 4.5 mEq/L (ref 3.5–5.1)
Sodium: 139 mEq/L (ref 135–145)
Total Bilirubin: 0.3 mg/dL (ref 0.2–1.2)
Total Protein: 6.7 g/dL (ref 6.0–8.3)

## 2017-09-20 LAB — CBC WITH DIFFERENTIAL/PLATELET
BASOS ABS: 0 10*3/uL (ref 0.0–0.1)
BASOS PCT: 0.6 % (ref 0.0–3.0)
Eosinophils Absolute: 0.2 10*3/uL (ref 0.0–0.7)
Eosinophils Relative: 2.3 % (ref 0.0–5.0)
HEMATOCRIT: 35.1 % — AB (ref 39.0–52.0)
Hemoglobin: 11.8 g/dL — ABNORMAL LOW (ref 13.0–17.0)
LYMPHS ABS: 1.6 10*3/uL (ref 0.7–4.0)
LYMPHS PCT: 24.1 % (ref 12.0–46.0)
MCHC: 33.6 g/dL (ref 30.0–36.0)
MCV: 91.7 fl (ref 78.0–100.0)
Monocytes Absolute: 0.6 10*3/uL (ref 0.1–1.0)
Monocytes Relative: 8.6 % (ref 3.0–12.0)
NEUTROS ABS: 4.2 10*3/uL (ref 1.4–7.7)
NEUTROS PCT: 64.4 % (ref 43.0–77.0)
PLATELETS: 251 10*3/uL (ref 150.0–400.0)
RBC: 3.83 Mil/uL — ABNORMAL LOW (ref 4.22–5.81)
RDW: 13 % (ref 11.5–15.5)
WBC: 6.5 10*3/uL (ref 4.0–10.5)

## 2017-09-20 LAB — MICROALBUMIN / CREATININE URINE RATIO
CREATININE, U: 56.1 mg/dL
MICROALB UR: 0.8 mg/dL (ref 0.0–1.9)
MICROALB/CREAT RATIO: 1.4 mg/g (ref 0.0–30.0)

## 2017-09-20 LAB — VITAMIN B12: Vitamin B-12: 223 pg/mL (ref 211–911)

## 2017-09-20 LAB — POCT GLYCOSYLATED HEMOGLOBIN (HGB A1C): Hemoglobin A1C: 7.7

## 2017-09-20 NOTE — Patient Instructions (Addendum)
Must check sugars at bedtime  Try taking only metformin in am and not at nite  If sugar stays > 150 in am go back to 2 pills   Please take Vitamin B12 500mg  regularly

## 2017-09-20 NOTE — Progress Notes (Signed)
Patient ID: Jonathan Hensley, male   DOB: 10/21/1940, 77 y.o.   MRN: [id number]   Reason for Appointment: follow-up of blood sugar   History of Present Illness    Type 2 diabetes mellitus, date of diagnosis: 1994.   He has been on insulin for the last few years, starting with only bedtime NPH and subsequently progressing to basal bolus regimen Also taking glipizide and metformin long-term  The HbgA1c was highest in 2013 at 8.2 Because of more fluctuation in his blood sugars at all times, difficulty with compliance and inconvenience of the multiple insulin dose regimen as well as needing higher doses of insulin he was started on the V.-go pump in 05/2013  Recent history:   The insulin regimen is: Basal rate of 20 units on V- go pump; boluses usually 4 units 2-3 times a day   His A1c has been consistently over 7% and now back to 7.7 although previously was lower than usual at 6.9  Current blood sugar patterns and problems identified:  He did bring his freestyle monitor for download, this was downloaded on a different program  He is not taking his blood sugars much except in the morning  Although his FASTING blood sugars are excellent with little fluctuation he has marked increase in blood sugars sporadically in the afternoon, taking them usually between 4-6 PM probably before supper  Was advised to take only one metformin on his last visit but he thinks his blood sugars were higher and he started taking a second tablet on his own  He has not reported any hypoglycemia at home with lowest blood sugar 70 3 in the morning  He is changing his V-go pump daily  However he is likely not taking his bolus insulin before evening meal and he thinks his high readings may be related to forgetting to bolus especially at lunchtime  Not checking blood sugars after meals especially in the evening and not clear if he is getting adequate insulin for the meal  He does not adjust his boluses  based on what he is eating as he is not able to count carbohydrates or judge his meal as well as before  He is a little more active recently and his weight has stayed down .  Difficulties with medication adherence: Bolusing for meals after eating at times, not covering snacks or desserts with boluses   Glucose monitoring: Freestyle    Blood Glucose readings from download  Mean values apply above for all meters except median for One Touch  PRE-MEAL Fasting Lunch Dinner Bedtime Overall  Glucose range: 73-161   119-329    Mean/median: 112   200  148   POST-MEAL PC Breakfast PC Lunch PC Dinner  Glucose range:   ?  Mean/median:       Previous readings  Mean values apply above for all meters except median for One Touch  PRE-MEAL Fasting Lunch Dinner Bedtime Overall  Glucose range: 67-199   88-214    Mean/median:  122    147   POST-MEAL PC Breakfast PC Lunch PC Dinner  Glucose range:   69-187  114-246  Mean/median:    170    Wt Readings from Last 3 Encounters:  09/20/17 218 lb 3.2 oz (99 kg)  07/18/17 219 lb (99.3 kg)  06/18/17 219 lb 12.8 oz (99.7 kg)   Meals: 2-3 meals per day and sometimes no lunch.    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 Always use your most recent med list.            benazepril 40 MG tablet Commonly known as:  LOTENSIN Take 40 mg by mouth at bedtime.   ELIQUIS 5 MG Tabs tablet Generic drug:  apixaban Take 5 mg by mouth 2 (two) times daily.   ezetimibe 10 MG tablet Commonly known as:  ZETIA Take 10 mg by mouth at bedtime.   freestyle lancets Use as instructed to check blood sugar 4 times per day dx code 250.00   FREESTYLE LITE test strip Generic drug:  glucose blood USE TO CHECK BLOOD SUGAR FOUR TIMES A DAY AS INSTRUCTED   metFORMIN 500 MG 24 hr tablet Commonly known as:  GLUCOPHAGE-XR Take 3 tablets (1,500 mg total) by mouth daily with supper.   simvastatin 20 MG tablet Commonly known as:  ZOCOR Take 20 mg by mouth at bedtime.   V-GO 20 Kit       Allergies:  Allergies  Allergen Reactions  . Other Other (See Comments)    Beta or alpha blockers: severe dizziness  . Tramadol Other (See Comments)    Dizziness     Past Medical History:  Diagnosis Date  . Arthritis    rheumatoid in his hands  . Atrial fibrillation ([place])   . Bulging lumbar disc   . Cancer ([place])   . DDD (degenerative disc disease), cervical   . Diabetes mellitus   . Diabetic retinopathy ([place])   . Double vision   . Dysrhythmia    Atrial Fibrillation  . History of prostate cancer   . Hypertension   . Low back pain   . Nephrolithiasis   . Sleep apnea    uses cpap    Past Surgical History:  Procedure Laterality Date  . APPENDECTOMY    . BACK SURGERY    . Bilateral L4-5 lumbar decompression including bilateral  11/2008  . brachytherapy    . COLONOSCOPY    . CYSTOSCOPY    . Left L4-L5 lumbar laminotomy and microdiskectomy with  07/14/2008    . LITHOTRIPSY    . LUMBAR LAMINECTOMY/DECOMPRESSION MICRODISCECTOMY Left 08/18/2013   Procedure: Left Lumbar Five-Sacral One Microdiskectomy;  Surgeon: [name], MD;  Location: [facility];  Service: Neurosurgery;  Laterality: Left;  Left Lumbar Five-Sacral One Microdiskectomy  . rotator cuff surgery    .  Scleral buckle, laser photocoagulation, and anterior chamber  01/04/2009    Family History  Problem Relation Age of Onset  . Heart failure Father   . Heart failure Mother   . Heart failure Brother        Half  brother.  . Colon cancer Neg Hx     Social History:  reports that he quit smoking about 44 years ago. He has never used smokeless tobacco. He reports that he does not drink alcohol or use drugs.  Review of Systems    He has been referred to the neurologist because of difficulty with word finding and continuing his sentence. He refuses to take the Aricept because it had a side effect of dizziness and he was told there is no other medication However he still has difficulty remembering  He was seen for second opinion in 1/19 and no pharmacological treatment recommended     Hypertension:  Blood pressure is being treated  with benazepril 40 mg, also followed by cardiologist  He is checking at home and he does not remember readings   BP Readings from Last 3 Encounters:  09/20/17 140/72  07/18/17 122/70  06/18/17 (!) 163/77     He is taking sodium bicarbonate for his history of kidney stones from urologist  HYPERLIPIDEMIA: Has been on simvastatin and Zetia with the following results:  Lab Results  Component Value Date   CHOL 117 06/11/2017   HDL 48.70 06/11/2017   [place] 48 06/11/2017   TRIG 103.0 06/11/2017   CHOLHDL 2 06/11/2017     He has had retinal detachment treated surgically in the past, followed by ophthalmologist regularly  Has decreased vision in the left eye   Sleep apnea,  on CPAP   Anemia :  This has been persistent and usually mild Most recent saturation was normal and pending from today.  He was reminded about taking OTC B-12 on his last visit but he does not think he takes this and does not realize the need for doing this especially with his low normal level and slightly worsening anemia   Lab Results  Component Value Date   WBC 6.6  06/11/2017   HGB 11.5 (L) 06/11/2017   HCT 33.9 (L) 06/11/2017   MCV 91.8 06/11/2017   PLT 260.0 06/11/2017   Lab Results  Component Value Date   VITAMINB12 258 06/11/2017     Examination:   BP 140/72 (BP Location: Left Arm, Patient Position: Sitting, Cuff Size: Normal)   Pulse 68   Ht _0  (1.778 m)   Wt 218 lb 3.2 oz (99 kg)   SpO2 98%   BMI 31.31 kg/m   Body mass index is 31.31 kg/m.     Assesment:     Diabetes type 2 on insulin  See history of present illness for  discussion of current blood sugar patterns, problems identified and day-to-day management  Although his blood sugars have initially improved with going back to the V-go pump he is having a higher A1c of 7.7  This is likely to be from poor compliance with mealtime boluses because of his memory issues Fasting readings are near normal but as low as 73 and may have a potential for hypoglycemia He does think she is getting some protein at bedtime for his neck  Recommendations:  Take only 1 metformin daily in the morning and not increase the dose without calling us  Discussed that this will tend to bring his blood sugars down overnight and we cannot adjust his basal rate on the pump overnight with his 20 units basal pump  To call if blood sugars are higher in the morning  Try to bolus consistently before every meal and also avoid large snacks in the afternoon  More readings after supper/at  bedtime and also some midday, discussed blood sugar targets after meals   HYPERTENSION: Blood pressure is normal  He says that his urologist wants a PSA done  ANEMIA: Reminded him to start B12 again and take this indefinitely Labs to be rechecked today  Patient Instructions  Must check sugars at bedtime  Try taking only metformin in am and not at nite  If sugar stays > 150 in am go back to 2 pills   Please take Vitamin B12 531m regularly   Counseling time on subjects discussed in assessment and plan  sections is over 50% of today's 25 minute visit   AElayne Snare5/01/2018, 12:12 PM

## 2017-09-21 LAB — PSA: PSA: 0 ng/mL — ABNORMAL LOW (ref 0.10–4.00)

## 2017-09-24 DIAGNOSIS — E113291 Type 2 diabetes mellitus with mild nonproliferative diabetic retinopathy without macular edema, right eye: Secondary | ICD-10-CM | POA: Diagnosis not present

## 2017-09-24 DIAGNOSIS — E113212 Type 2 diabetes mellitus with mild nonproliferative diabetic retinopathy with macular edema, left eye: Secondary | ICD-10-CM | POA: Diagnosis not present

## 2017-09-24 DIAGNOSIS — H43813 Vitreous degeneration, bilateral: Secondary | ICD-10-CM | POA: Diagnosis not present

## 2017-09-24 DIAGNOSIS — H35371 Puckering of macula, right eye: Secondary | ICD-10-CM | POA: Diagnosis not present

## 2017-10-15 DIAGNOSIS — M79675 Pain in left toe(s): Secondary | ICD-10-CM | POA: Diagnosis not present

## 2017-10-15 DIAGNOSIS — L851 Acquired keratosis [keratoderma] palmaris et plantaris: Secondary | ICD-10-CM | POA: Diagnosis not present

## 2017-10-15 DIAGNOSIS — E1151 Type 2 diabetes mellitus with diabetic peripheral angiopathy without gangrene: Secondary | ICD-10-CM | POA: Diagnosis not present

## 2017-10-15 DIAGNOSIS — M79674 Pain in right toe(s): Secondary | ICD-10-CM | POA: Diagnosis not present

## 2017-10-15 DIAGNOSIS — B351 Tinea unguium: Secondary | ICD-10-CM | POA: Diagnosis not present

## 2017-10-23 ENCOUNTER — Telehealth: Payer: Self-pay | Admitting: Endocrinology

## 2017-10-23 NOTE — Telephone Encounter (Signed)
Called patient and left detailed voicemail with MD message.

## 2017-10-23 NOTE — Telephone Encounter (Signed)
Please check with him about who is prescribing his CPAP treatment, I do not remember treating him.  If he has no other physician for this we can call him the supplies as required

## 2017-10-23 NOTE — Telephone Encounter (Signed)
Patient was getting all CPAP supplies from [facility] but they have closed down. Patient now needs all CPAP supplies to be sent to [facility], [address], [place], [place], [place], [place] Directph# [phone], Fax# [phone] If questions please contact patient at ph# [phone]

## 2017-10-23 NOTE — Telephone Encounter (Signed)
Please be advised.  °

## 2017-10-26 ENCOUNTER — Ambulatory Visit: Payer: Medicare Other | Admitting: Nurse Practitioner

## 2017-10-26 ENCOUNTER — Telehealth: Payer: Self-pay | Admitting: Endocrinology

## 2017-10-26 NOTE — Telephone Encounter (Signed)
Pt came to office to speak with staff about issues obtaining CPAP supplies. Pt stated that [name] has filled out forms for him in the past (approx 1 year ago) and this information was sent to [facility] so pt could obtain CPAP supplies. Pt gave staff a copy of business card with the name of Katina Degree, [place] and phone number [phone] and she was called to obtain new paperwork via fax for [name] to fill out. Deanna Young did not answer when called, but a voicemail was left asking her to call this office back.

## 2017-10-26 NOTE — Telephone Encounter (Signed)
Pt called back and insisted that [name] has been the MD supplying him with CPAP supplies. Pt stated  That he came to the office Monday and gave front desk staff paperwork and he states that he watched the front desk staff give the copy to [name] and then he states that [name] was in the room with a pt at this time. Pt became confused, and stated that he would bring the proof to the office. I told pt that it would not be necessary, because staff believes him if he says he brought paperwork to the office. We would search for it, and call pt when it is located. Pt verbalized understanding.

## 2017-10-26 NOTE — Telephone Encounter (Signed)
Patient is not understanding the detailed message you left on his voice, please call patient he is confused.  Thank you

## 2017-10-26 NOTE — Telephone Encounter (Signed)
Left message for pt to call back  °

## 2017-10-29 ENCOUNTER — Telehealth: Payer: Self-pay | Admitting: Endocrinology

## 2017-10-29 NOTE — Telephone Encounter (Signed)
Hunter is calling in regards to patient's CPAP Supplies. And would like a call back   Deanna [phone]

## 2017-10-29 NOTE — Telephone Encounter (Signed)
Jonathan Hensley called back and stated that she needed a new Rx for CPAP supplies that contains the ICD10 code, which is G47.33, a demographics sheet, and the last office note in which OSA was discussed. She was informed that according to records, [name] has not discussed this with him since his visit on 11/26/14. She stated that he would need to be seen again then and she would call him and notify him that he needs to call this office and schedule an appointment. Once pt has been seen for an appointment regarding OSA, she can accept the other info.

## 2017-10-29 NOTE — Telephone Encounter (Signed)
Apparently has been prescribed by another provider, please confirm

## 2017-10-29 NOTE — Telephone Encounter (Signed)
Called and left a voicemail for [place] asking her to call this office back.

## 2017-10-29 NOTE — Telephone Encounter (Signed)
Spoke with Tilda Burrow again at [facility]. She spoke with pt and explained that he needed to be seen by a pulmonary medicine doctor. According to records they have, Dr. Marina Gravel with [facility] has provided care to pt in the past regarding his OSA. She told pt that he needed to call this MD and schedule a f/u appt. In order to continue getting supplies. She stated that pt verbalized understanding and he stated that he would call [facility] tomorrow and schedule an appointment.

## 2017-10-31 DIAGNOSIS — E669 Obesity, unspecified: Secondary | ICD-10-CM | POA: Diagnosis not present

## 2017-10-31 DIAGNOSIS — I4891 Unspecified atrial fibrillation: Secondary | ICD-10-CM | POA: Diagnosis not present

## 2017-10-31 DIAGNOSIS — I10 Essential (primary) hypertension: Secondary | ICD-10-CM | POA: Diagnosis not present

## 2017-10-31 DIAGNOSIS — G4733 Obstructive sleep apnea (adult) (pediatric): Secondary | ICD-10-CM | POA: Diagnosis not present

## 2017-11-01 DIAGNOSIS — H33031 Retinal detachment with giant retinal tear, right eye: Secondary | ICD-10-CM | POA: Diagnosis not present

## 2017-11-01 DIAGNOSIS — E119 Type 2 diabetes mellitus without complications: Secondary | ICD-10-CM | POA: Diagnosis not present

## 2017-11-01 DIAGNOSIS — H35072 Retinal telangiectasis, left eye: Secondary | ICD-10-CM | POA: Diagnosis not present

## 2017-11-01 DIAGNOSIS — Z794 Long term (current) use of insulin: Secondary | ICD-10-CM | POA: Diagnosis not present

## 2017-11-17 ENCOUNTER — Other Ambulatory Visit: Payer: Self-pay | Admitting: Endocrinology

## 2017-12-20 DIAGNOSIS — I10 Essential (primary) hypertension: Secondary | ICD-10-CM | POA: Diagnosis not present

## 2017-12-20 DIAGNOSIS — I480 Paroxysmal atrial fibrillation: Secondary | ICD-10-CM | POA: Diagnosis not present

## 2017-12-20 DIAGNOSIS — G4733 Obstructive sleep apnea (adult) (pediatric): Secondary | ICD-10-CM | POA: Diagnosis not present

## 2017-12-20 DIAGNOSIS — E113552 Type 2 diabetes mellitus with stable proliferative diabetic retinopathy, left eye: Secondary | ICD-10-CM | POA: Diagnosis not present

## 2017-12-21 ENCOUNTER — Ambulatory Visit (INDEPENDENT_AMBULATORY_CARE_PROVIDER_SITE_OTHER): Payer: Medicare Other | Admitting: Endocrinology

## 2017-12-21 ENCOUNTER — Encounter: Payer: Self-pay | Admitting: Endocrinology

## 2017-12-21 VITALS — BP 152/84 | HR 64 | Ht 70.0 in | Wt 217.0 lb

## 2017-12-21 DIAGNOSIS — I10 Essential (primary) hypertension: Secondary | ICD-10-CM | POA: Diagnosis not present

## 2017-12-21 DIAGNOSIS — E1165 Type 2 diabetes mellitus with hyperglycemia: Secondary | ICD-10-CM | POA: Diagnosis not present

## 2017-12-21 DIAGNOSIS — D638 Anemia in other chronic diseases classified elsewhere: Secondary | ICD-10-CM

## 2017-12-21 DIAGNOSIS — H8110 Benign paroxysmal vertigo, unspecified ear: Secondary | ICD-10-CM | POA: Diagnosis not present

## 2017-12-21 DIAGNOSIS — Z794 Long term (current) use of insulin: Secondary | ICD-10-CM | POA: Diagnosis not present

## 2017-12-21 LAB — COMPREHENSIVE METABOLIC PANEL
ALT: 17 U/L (ref 0–53)
AST: 27 U/L (ref 0–37)
Albumin: 4 g/dL (ref 3.5–5.2)
Alkaline Phosphatase: 56 U/L (ref 39–117)
BILIRUBIN TOTAL: 0.4 mg/dL (ref 0.2–1.2)
BUN: 28 mg/dL — ABNORMAL HIGH (ref 6–23)
CO2: 28 mEq/L (ref 19–32)
CREATININE: 1.38 mg/dL (ref 0.40–1.50)
Calcium: 9.7 mg/dL (ref 8.4–10.5)
Chloride: 103 mEq/L (ref 96–112)
GFR: 53.08 mL/min — AB (ref >60.00)
GLUCOSE: 227 mg/dL — AB (ref 70–99)
Potassium: 4.4 mEq/L (ref 3.5–5.1)
Sodium: 138 mEq/L (ref 135–145)
TOTAL PROTEIN: 7.5 g/dL (ref 6.0–8.3)

## 2017-12-21 LAB — LIPID PANEL
Cholesterol: 127 mg/dL (ref 0–200)
HDL: 49.5 mg/dL (ref >39.00)
LDL Cholesterol: 53 mg/dL (ref 0–99)
NonHDL: 77.55
TRIGLYCERIDES: 125 mg/dL (ref 0.0–149.0)
Total CHOL/HDL Ratio: 3
VLDL: 25 mg/dL (ref 0.0–40.0)

## 2017-12-21 LAB — CBC WITH DIFFERENTIAL/PLATELET
BASOS ABS: 0 10*3/uL (ref 0.0–0.1)
Basophils Relative: 0.4 % (ref 0.0–3.0)
EOS ABS: 0.1 10*3/uL (ref 0.0–0.7)
Eosinophils Relative: 1.9 % (ref 0.0–5.0)
HCT: 34.2 % — ABNORMAL LOW (ref 39.0–52.0)
Hemoglobin: 11.6 g/dL — ABNORMAL LOW (ref 13.0–17.0)
LYMPHS ABS: 1.5 10*3/uL (ref 0.7–4.0)
Lymphocytes Relative: 19.9 % (ref 12.0–46.0)
MCHC: 34 g/dL (ref 30.0–36.0)
MCV: 92.3 fl (ref 78.0–100.0)
MONO ABS: 0.6 10*3/uL (ref 0.1–1.0)
MONOS PCT: 8 % (ref 3.0–12.0)
NEUTROS PCT: 69.8 % (ref 43.0–77.0)
Neutro Abs: 5.4 10*3/uL (ref 1.4–7.7)
Platelets: 236 10*3/uL (ref 150.0–400.0)
RBC: 3.71 Mil/uL — AB (ref 4.22–5.81)
RDW: 13.2 % (ref 11.5–15.5)
WBC: 7.7 10*3/uL (ref 4.0–10.5)

## 2017-12-21 LAB — POCT GLYCOSYLATED HEMOGLOBIN (HGB A1C): Hemoglobin A1C: 7.5 % — AB (ref 4.0–5.6)

## 2017-12-21 LAB — TSH: TSH: 3.27 u[IU]/mL (ref 0.35–4.50)

## 2017-12-21 NOTE — Progress Notes (Addendum)
Patient ID: Jonathan Hensley, male   DOB: 11-Sep-1940, 77 y.o.   MRN: [id number]   Reason for Appointment: follow-up of blood sugar   History of Present Illness    Type 2 diabetes mellitus, date of diagnosis: 1994.   He has been on insulin for the last few years, starting with only bedtime NPH and subsequently progressing to basal bolus regimen Also taking glipizide and metformin long-term  The HbgA1c was highest in 2013 at 8.2 Because of more fluctuation in his blood sugars at all times, difficulty with compliance and inconvenience of the multiple insulin dose regimen as well as needing higher doses of insulin he was started on the V.-go pump in 05/2013  Recent history:   The insulin regimen is: Basal rate of 20 units on V- go pump; boluses usually 4 units 2-3 times a day   His A1c has been consistently over 7% and now 7.5  Current blood sugar patterns and problems identified:  He did bring his freestyle monitor for download  He checks his blood sugars only fasting and randomly in the evenings probably more before dinnertime  Although he thinks he is bolusing consistently at meals he has a few readings over 200 both in the morning and evening which he cannot explain  Also fasting readings have been as low as 80 but as high as 255 also  He still thinks he is changing his V-go pump at the same time daily  He was told to adjust his boluses based on how much he is eating but he still taking only 2 clicks with every meal  He has a few readings over 200 later in the evening but again not clear if these are before or after eating   Glucose monitoring: Freestyle    Blood Glucose readings from download  Mean values apply above for all meters except median for One Touch  PRE-MEAL Fasting Lunch Dinner Bedtime Overall  Glucose range:  80-255   103-225    Mean/median:  147   175  165   POST-MEAL PC Breakfast PC Lunch PC Dinner  Glucose range:    215-262  Mean/median:         Wt Readings from Last 3 Encounters:  12/21/17 217 lb (98.4 kg)  09/20/17 218 lb 3.2 oz (99 kg)  07/18/17 219 lb (99.3 kg)   Meals: 2-3 meals per day and sometimes no lunch.    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 Always use your most recent med list.          benazepril 40 MG tablet Commonly known as:  LOTENSIN Take 40 mg by mouth at bedtime.   ELIQUIS 5 MG Tabs tablet Generic drug:  apixaban Take 5 mg by mouth 2 (two) times daily.   ezetimibe 10 MG tablet Commonly known as:  ZETIA Take 10 mg by mouth at bedtime.   freestyle lancets Use as instructed to check blood sugar 4 times per day dx code 250.00   FREESTYLE LITE test strip Generic drug:  glucose blood USE TO CHECK BLOOD SUGAR FOUR TIMES A DAY AS INSTRUCTED   metFORMIN 500 MG 24 hr tablet Commonly known as:  GLUCOPHAGE-XR Take 3 tablets (1,500 mg total) by mouth daily with supper.   NOVOLOG 100 UNIT/ML injection Generic drug:  insulin aspart USE UP TO 56 UNITS IN V-GO PUMP DAILY AS DIRECTED   simvastatin 20 MG tablet Commonly known as:  ZOCOR Take 20 mg by mouth at bedtime.   V-GO 20 Kit       Allergies:  Allergies  Allergen Reactions  . Other Other (See Comments)    Beta or alpha blockers: severe dizziness  .  Tramadol Other (See Comments)    Dizziness     Past Medical History:  Diagnosis Date  . Arthritis    rheumatoid in his hands  . Atrial fibrillation ([place])   . Bulging lumbar disc   . Cancer ([place])   . DDD (degenerative disc disease), cervical   . Diabetes mellitus   . Diabetic retinopathy ([place])   . Double vision   . Dysrhythmia    Atrial Fibrillation  . History of prostate cancer   . Hypertension   . Low back pain   . Nephrolithiasis   . Sleep apnea    uses cpap    Past Surgical History:  Procedure Laterality Date  . APPENDECTOMY    . BACK SURGERY    . Bilateral L4-5 lumbar decompression including bilateral  11/2008  . brachytherapy    . COLONOSCOPY    . CYSTOSCOPY    . Left L4-L5 lumbar laminotomy and microdiskectomy with  07/14/2008    . LITHOTRIPSY    . LUMBAR LAMINECTOMY/DECOMPRESSION MICRODISCECTOMY Left 08/18/2013   Procedure: Left Lumbar Five-Sacral One Microdiskectomy;  Surgeon: [name], MD;  Location: [facility];  Service: Neurosurgery;  Laterality: Left;  Left Lumbar Five-Sacral One Microdiskectomy  . rotator cuff surgery    . Scleral buckle, laser photocoagulation, and anterior chamber  01/04/2009    Family History  Problem Relation Age of Onset  . Heart failure Father   . Heart failure Mother   . Heart failure Brother        Half brother.  . Colon cancer Neg Hx     Social History:  reports that he quit smoking about 44 years ago. He has never used smokeless tobacco. He reports that he does not drink alcohol or use drugs.  Review of Systems    He has been referred to the neurologist because of difficulty with word finding and continuing his sentence. He thinks he tried Aricept and it caused dizziness Has not followed up with neurologist since 1/19     Hypertension:  Blood pressure is being treated  with benazepril 40 mg, also followed by cardiologist  He is checking at home: Today blood pressure was 143/72 He thinks his blood  pressure goes up when he comes in because of high very traffic  BP Readings from Last 3 Encounters:  12/21/17 (!) 152/84  09/20/17 140/72  07/18/17 122/70     He is  taking sodium bicarbonate for his history of kidney stones from urologist  HYPERLIPIDEMIA: Has been on simvastatin and Zetia with the following results:  Lab Results  Component Value Date   CHOL 127 12/21/2017   HDL 49.50 12/21/2017   LDLCALC 53 12/21/2017   TRIG 125.0 12/21/2017   CHOLHDL 3 12/21/2017     He has had retinal detachment treated surgically in the past, followed by ophthalmologist regularly  Has decreased vision in the left eye   Has had sleep apnea,  on CPAP   Anemia :  This has been usually mild  On B12 supplements, he thinks he is taking them since his last visit   Lab Results  Component Value Date   WBC 7.7 12/21/2017   HGB 11.6 (L) 12/21/2017   HCT 34.2 (L) 12/21/2017   MCV 92.3 12/21/2017   PLT 236.0 12/21/2017   Lab Results  Component Value Date   VITAMINB12 223 09/20/2017    Vertigo: He has had swimmy headedness and swimming sensation with movements for the last 2 days or so This is better now Not clear if he has had relief from meclizine previously    Examination:   BP (!) 152/84 (BP Location: Left Arm, Patient Position: Sitting, Cuff Size: Normal)   Pulse 64   Ht 5' 10"  (1.778 m)   Wt 217 lb (98.4 kg)   SpO2 98%   BMI 31.14 kg/m   Body mass index is 31.14 kg/m.     Assesment:     Diabetes type 2 on insulin  See history of present illness for  discussion of current blood sugar patterns, problems identified and day-to-day management  His A1c is 7.5  Currently with his V-go pump he has somewhat variable blood sugars both morning and evening Does not do readings after meals and not clear why his blood sugar fluctuates Most likely he needs 3 clicks on his pump at suppertime but he has difficulty remembering instructions Again reminded him to increase boluses by  1 click if his blood sugar is over 200 before any meal To change pump at the same time daily For now he can continue taking only 1 tablet of metformin since he may occasionally have low normal readings fasting if taking 2 tablets daily   HYPERTENSION: Blood pressure is high normal but apparently better with cardiologist yesterday and will continue same regimen He will call if his blood pressure is around 150 or more consistently  Periodic vertigo: He can have meclizine as needed  ANEMIA: Labs to be rechecked today Again reminded him to take B12 vitamins daily consistently because of history of deficiency and is taking metformin  Patient is wanting to find a doctor in the [place] at [place] as he does not feel like he can drive on his own and given the name of a local endocrinologist  Total visit time for evaluation and management of multiple problems and counseling =25 minutes  Patient Instructions  Check blood sugars on waking up  4/7  Also check blood sugars about 2 hours after a meal and do this after different meals by rotation  Recommended blood sugar levels on waking up is 90-130 and about 2 hours after meal is 130-160  Please bring your blood sugar monitor to each visit, thank you  Extra click if sugar >784  Take B 12 551m daily  DR NIDA in [place]8/03/2018, 9:28 PM     ADDENDUM: Anemia is stable, chemistry normal No  change in medication recommended

## 2017-12-21 NOTE — Patient Instructions (Addendum)
Check blood sugars on waking up  4/7  Also check blood sugars about 2 hours after a meal and do this after different meals by rotation  Recommended blood sugar levels on waking up is 90-130 and about 2 hours after meal is 130-160  Please bring your blood sugar monitor to each visit, thank you  Extra click if sugar >971  Take B 12 500mg  daily  DR NIDA in [place]

## 2017-12-23 MED ORDER — MECLIZINE HCL 12.5 MG PO TABS: 12.5000 mg | ORAL_TABLET | Freq: Three times a day (TID) | ORAL | 0 refills | Status: DC | PRN

## 2017-12-23 NOTE — Addendum Note (Signed)
Addended by: [name] on: 12/23/2017 09:35 PM   Modules accepted: Orders, Level of Service

## 2017-12-24 DIAGNOSIS — M79675 Pain in left toe(s): Secondary | ICD-10-CM | POA: Diagnosis not present

## 2017-12-24 DIAGNOSIS — E1151 Type 2 diabetes mellitus with diabetic peripheral angiopathy without gangrene: Secondary | ICD-10-CM | POA: Diagnosis not present

## 2017-12-24 DIAGNOSIS — M79674 Pain in right toe(s): Secondary | ICD-10-CM | POA: Diagnosis not present

## 2017-12-24 DIAGNOSIS — L851 Acquired keratosis [keratoderma] palmaris et plantaris: Secondary | ICD-10-CM | POA: Diagnosis not present

## 2017-12-24 DIAGNOSIS — B351 Tinea unguium: Secondary | ICD-10-CM | POA: Diagnosis not present

## 2017-12-27 ENCOUNTER — Other Ambulatory Visit: Payer: Self-pay | Admitting: Endocrinology

## 2018-01-17 ENCOUNTER — Ambulatory Visit (INDEPENDENT_AMBULATORY_CARE_PROVIDER_SITE_OTHER): Payer: Medicare Other | Admitting: Otolaryngology

## 2018-01-17 DIAGNOSIS — H903 Sensorineural hearing loss, bilateral: Secondary | ICD-10-CM | POA: Diagnosis not present

## 2018-01-17 DIAGNOSIS — H6122 Impacted cerumen, left ear: Secondary | ICD-10-CM

## 2018-03-04 DIAGNOSIS — M79675 Pain in left toe(s): Secondary | ICD-10-CM | POA: Diagnosis not present

## 2018-03-04 DIAGNOSIS — E1151 Type 2 diabetes mellitus with diabetic peripheral angiopathy without gangrene: Secondary | ICD-10-CM | POA: Diagnosis not present

## 2018-03-04 DIAGNOSIS — B351 Tinea unguium: Secondary | ICD-10-CM | POA: Diagnosis not present

## 2018-03-04 DIAGNOSIS — L851 Acquired keratosis [keratoderma] palmaris et plantaris: Secondary | ICD-10-CM | POA: Diagnosis not present

## 2018-03-04 DIAGNOSIS — M79674 Pain in right toe(s): Secondary | ICD-10-CM | POA: Diagnosis not present

## 2018-03-25 DIAGNOSIS — E113293 Type 2 diabetes mellitus with mild nonproliferative diabetic retinopathy without macular edema, bilateral: Secondary | ICD-10-CM | POA: Diagnosis not present

## 2018-03-25 DIAGNOSIS — H35371 Puckering of macula, right eye: Secondary | ICD-10-CM | POA: Diagnosis not present

## 2018-03-25 DIAGNOSIS — H43813 Vitreous degeneration, bilateral: Secondary | ICD-10-CM | POA: Diagnosis not present

## 2018-03-26 ENCOUNTER — Other Ambulatory Visit: Payer: Self-pay | Admitting: Endocrinology

## 2018-03-26 ENCOUNTER — Encounter: Payer: Self-pay | Admitting: Endocrinology

## 2018-03-26 ENCOUNTER — Ambulatory Visit (INDEPENDENT_AMBULATORY_CARE_PROVIDER_SITE_OTHER): Payer: Medicare Other | Admitting: Endocrinology

## 2018-03-26 VITALS — BP 138/68 | HR 68 | Ht 70.0 in | Wt 217.0 lb

## 2018-03-26 DIAGNOSIS — D508 Other iron deficiency anemias: Secondary | ICD-10-CM | POA: Diagnosis not present

## 2018-03-26 DIAGNOSIS — E1165 Type 2 diabetes mellitus with hyperglycemia: Secondary | ICD-10-CM | POA: Diagnosis not present

## 2018-03-26 DIAGNOSIS — Z23 Encounter for immunization: Secondary | ICD-10-CM

## 2018-03-26 DIAGNOSIS — Z794 Long term (current) use of insulin: Secondary | ICD-10-CM | POA: Diagnosis not present

## 2018-03-26 LAB — COMPREHENSIVE METABOLIC PANEL
ALT: 12 U/L (ref 0–53)
AST: 16 U/L (ref 0–37)
Albumin: 4.3 g/dL (ref 3.5–5.2)
Alkaline Phosphatase: 44 U/L (ref 39–117)
BUN: 33 mg/dL — AB (ref 6–23)
CHLORIDE: 105 meq/L (ref 96–112)
CO2: 27 meq/L (ref 19–32)
CREATININE: 1.27 mg/dL (ref 0.40–1.50)
Calcium: 9.7 mg/dL (ref 8.4–10.5)
GFR: 58.38 mL/min — ABNORMAL LOW (ref >60.00)
Glucose, Bld: 106 mg/dL — ABNORMAL HIGH (ref 70–99)
POTASSIUM: 4.3 meq/L (ref 3.5–5.1)
SODIUM: 140 meq/L (ref 135–145)
Total Bilirubin: 0.3 mg/dL (ref 0.2–1.2)
Total Protein: 7.5 g/dL (ref 6.0–8.3)

## 2018-03-26 LAB — CBC WITH DIFFERENTIAL/PLATELET
BASOS ABS: 0 10*3/uL (ref 0.0–0.1)
BASOS PCT: 0.6 % (ref 0.0–3.0)
EOS ABS: 0.2 10*3/uL (ref 0.0–0.7)
Eosinophils Relative: 2.1 % (ref 0.0–5.0)
HEMATOCRIT: 36.9 % — AB (ref 39.0–52.0)
HEMOGLOBIN: 12.2 g/dL — AB (ref 13.0–17.0)
LYMPHS PCT: 22.8 % (ref 12.0–46.0)
Lymphs Abs: 1.7 10*3/uL (ref 0.7–4.0)
MCHC: 33.1 g/dL (ref 30.0–36.0)
MCV: 93.3 fl (ref 78.0–100.0)
MONOS PCT: 8.8 % (ref 3.0–12.0)
Monocytes Absolute: 0.7 10*3/uL (ref 0.1–1.0)
NEUTROS ABS: 4.9 10*3/uL (ref 1.4–7.7)
Neutrophils Relative %: 65.7 % (ref 43.0–77.0)
PLATELETS: 249 10*3/uL (ref 150.0–400.0)
RBC: 3.95 Mil/uL — ABNORMAL LOW (ref 4.22–5.81)
RDW: 13.5 % (ref 11.5–15.5)
WBC: 7.5 10*3/uL (ref 4.0–10.5)

## 2018-03-26 LAB — POCT GLYCOSYLATED HEMOGLOBIN (HGB A1C): Hemoglobin A1C: 7.4 % — AB (ref 4.0–5.6)

## 2018-03-26 LAB — IBC PANEL
IRON: 64 ug/dL (ref 42–165)
Saturation Ratios: 19.6 % — ABNORMAL LOW (ref 20.0–50.0)
TRANSFERRIN: 233 mg/dL (ref 212.0–360.0)

## 2018-03-26 NOTE — Patient Instructions (Addendum)
Make sure and bolus click 1-2 times at lunch time   Must check sugars midday or bedtime  Check blood sugars on waking up 4 days a week  Also check blood sugars about 2 hours after meals and do this after different meals by rotation  Recommended blood sugar levels on waking up are 90-130 and about 2 hours after meal is 130-160  Please bring your blood sugar monitor to each visit, thank you

## 2018-03-26 NOTE — Progress Notes (Signed)
Please call to let patient know that the lab results are good except iron slightly low, if he is not taking iron he should take OTC ferrous sulfate 3 days a week

## 2018-03-26 NOTE — Progress Notes (Signed)
Patient ID: Jonathan Hensley, male   DOB: 02-Jan-1941, 77 y.o.   MRN: [id number]   Reason for Appointment: follow-up for medical care  History of Present Illness   PROBLEM 1  Type 2 diabetes mellitus, date of diagnosis: 1994.   He has been on insulin for the last few years, starting with only bedtime NPH and subsequently progressing to basal bolus regimen Also taking glipizide and metformin long-term  The HbgA1c was highest in 2013 at 8.2 Because of more fluctuation in his blood sugars at all times, difficulty with compliance and inconvenience of the multiple insulin dose regimen as well as needing higher doses of insulin he was started on the V.-go pump in 05/2013  Recent history:   The insulin regimen is: Basal rate of 20 units on V- go pump; boluses 2-4 units 2-3 times a day   His A1c has been consistently over 7% and now 7.4, previously 7.5  Current blood sugar patterns and problems identified:  He did bring his freestyle monitor for download  However he is checking his blood sugar mostly in the morning and only in the last few days a few readings before dinner around 6 PM  Although he thinks he is not eating anything at lunch his glucose is generally high dinnertime except yesterday evening  Also not clear if he has high readings after breakfast when he takes only 2 units bolus instead of taking 4 units as usual  Also unclear what his blood sugars are after evening meal  He still likes using the V-go pump and is likely not interested in the Omnipod pump which he was not able to handle  His weight is about the same  He still thinks he is changing his V-go pump at the same time daily at nite  No hypoglycemia reported, previously had occasional readings as low as 80 and was told to try only 500 mg metformin daily  His weight is stable Fasting readings are mildly increased overall and as high as 221, likely to be related to his compliance with diet or boluses the  evening before  Glucose monitoring: Freestyle    Blood Glucose readings from download   PRE-MEAL Fasting Lunch Dinner Bedtime Overall  Glucose range:  101-221   137-255    Mean/median: 159   200  166   POST-MEAL PC Breakfast PC Lunch PC Dinner  Glucose range:   ?  Mean/median:      Previous readings  PRE-MEAL Fasting Lunch Dinner Bedtime Overall  Glucose range:  80-255   103-225    Mean/median:  147   175  165   POST-MEAL PC Breakfast PC Lunch PC Dinner  Glucose range:    215-262  Mean/median:        Wt Readings from Last 3 Encounters:  03/26/18 217 lb (98.4 kg)  12/21/17 217 lb (98.4 kg)  09/20/17 218 lb 3.2 oz (99 kg)   Meals: 2-3 meals per day and sometimes no lunch.    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 Always use your most recent med list.          benazepril 40 MG tablet Commonly known as:  LOTENSIN Take 40 mg by mouth at bedtime.   ELIQUIS 5 MG Tabs tablet Generic drug:  apixaban Take 5 mg by mouth 2 (two) times daily.   ezetimibe 10 MG tablet Commonly known as:  ZETIA Take 10 mg by mouth at bedtime.   freestyle lancets Use as instructed to check blood sugar 4 times per day dx code 250.00   FREESTYLE LITE test strip Generic drug:  glucose  blood USE TO CHECK BLOOD SUGAR FOUR TIMES A DAY AS INSTRUCTED   meclizine 12.5 MG tablet Commonly known as:  ANTIVERT Take 1 tablet (12.5 mg total) by mouth 3 (three) times daily as needed for dizziness.   metFORMIN 500 MG 24 hr tablet Commonly known as:  GLUCOPHAGE-XR Take 3 tablets (1,500 mg total) by mouth daily with supper.   NOVOLOG 100 UNIT/ML injection Generic drug:  insulin aspart USE UP TO 56 UNITS IN V-GO PUMP DAILY AS DIRECTED   simvastatin 20 MG tablet Commonly known as:  ZOCOR Take 20 mg by mouth at bedtime.   V-GO 20 Kit       Allergies:  Allergies  Allergen Reactions  . Other Other (See Comments)    Beta or alpha blockers: severe dizziness  . Tramadol Other (See Comments)    Dizziness     Past Medical History:  Diagnosis Date  . Arthritis    rheumatoid in his hands  . Atrial fibrillation ([place])   . Bulging lumbar disc   . Cancer ([place])   . DDD (degenerative disc disease), cervical   . Diabetes mellitus   . Diabetic retinopathy ([place])   . Double vision   . Dysrhythmia    Atrial Fibrillation  . History of prostate cancer   . Hypertension   . Low back pain   . Nephrolithiasis   . Sleep apnea    uses cpap    Past Surgical History:  Procedure Laterality Date  . APPENDECTOMY    . BACK SURGERY    . Bilateral L4-5 lumbar decompression including bilateral  11/2008  . brachytherapy    . COLONOSCOPY    . CYSTOSCOPY    . Left L4-L5 lumbar laminotomy and microdiskectomy with  07/14/2008    . LITHOTRIPSY    . LUMBAR LAMINECTOMY/DECOMPRESSION MICRODISCECTOMY Left 08/18/2013   Procedure: Left Lumbar Five-Sacral One Microdiskectomy;  Surgeon: [name], MD;  Location: [facility];  Service: Neurosurgery;  Laterality: Left;  Left Lumbar Five-Sacral One Microdiskectomy  . rotator cuff surgery    . Scleral buckle, laser photocoagulation, and anterior chamber  01/04/2009    Family History  Problem Relation Age of Onset  . Heart failure Father   .  Heart failure Mother   . Heart failure Brother        Half brother.  . Colon cancer Neg Hx     Social History:  reports that he quit smoking about 45 years ago.  He has never used smokeless tobacco. He reports that he does not drink alcohol or use drugs.  Review of Systems    He has been referred to the neurologist because of difficulty with word finding and continuing his sentence. He thinks he tried Aricept and it caused dizziness Has not followed up with neurologist as recommended since 1/19     Hypertension:  Blood pressure is being treated  with benazepril 40 mg, also followed by cardiologist  He is checking at home: Today blood pressure was 134/71   BP Readings from Last 3 Encounters:  03/26/18 138/68  12/21/17 (!) 152/84  09/20/17 140/72     He is taking sodium bicarbonate for his history of kidney stones from urologist  HYPERLIPIDEMIA: Has been on simvastatin and Zetia with the following results:  Lab Results  Component Value Date   CHOL 127 12/21/2017   HDL 49.50 12/21/2017   [place] 53 12/21/2017   TRIG 125.0 12/21/2017   CHOLHDL 3 12/21/2017     He has had retinal detachment treated surgically in the past, followed by ophthalmologist regularly  Has decreased vision in the left eye   Has had sleep apnea,  on CPAP   Anemia :  This has been usually mild and stable  On B12 supplements, was told to resume in 5/19  He has refused to go for colonoscopy in the past  Lab Results  Component Value Date   WBC 7.7 12/21/2017   HGB 11.6 (L) 12/21/2017   HCT 34.2 (L) 12/21/2017   MCV 92.3 12/21/2017   PLT 236.0 12/21/2017   Lab Results  Component Value Date   VITAMINB12 223 09/20/2017    Lab Results  Component Value Date   OCCULTBLD POSITIVE (A) 06/13/2016   OCCULTBLD Negative 03/22/2015       Examination:   BP 138/68 (Cuff Size: Normal)   Pulse 68   Ht 5' 10"  (1.778 m)   Wt 217 lb (98.4 kg)   SpO2 97%   BMI 31.14 kg/m   Body mass index is  31.14 kg/m.     Assesment:     Diabetes type 2 on insulin with the V-go pump  See history of present illness for  discussion of current blood sugar patterns, problems identified and day-to-day management  His A1c is 7.4 stable  With his mild dementia difficult to get his compliance as needed for monitoring his blood sugar and taking boluses all the time Fasting readings are mildly increased overall Can go back to taking metformin twice a day now especially since his readings are mostly high at suppertime Reminded him to take a bolus for any snack or meal during the day since blood sugars are higher at suppertime   HYPERTENSION: Blood pressure is better today  ANEMIA: Labs to be rechecked today  Flu vaccine given  Patient Instructions  Make sure and bolus click 1-2 times at lunch time   Must check sugars midday or bedtime  Check blood sugars on waking up 4 days a week  Also check blood sugars about 2 hours after meals and do this after different meals by rotation  Recommended blood sugar levels on waking up are 90-130 and about 2 hours after meal is 130-160  Please bring your blood sugar monitor to each visit, thank you        [name] 03/26/2018, 2:45 PM

## 2018-03-28 ENCOUNTER — Telehealth: Payer: Self-pay | Admitting: Endocrinology

## 2018-03-28 NOTE — Telephone Encounter (Signed)
Patient returning call regarding lab results  Good call back number is [phone]

## 2018-03-29 NOTE — Telephone Encounter (Signed)
LMTCB

## 2018-04-23 ENCOUNTER — Ambulatory Visit (INDEPENDENT_AMBULATORY_CARE_PROVIDER_SITE_OTHER): Payer: Medicare Other | Admitting: Urology

## 2018-04-23 DIAGNOSIS — C61 Malignant neoplasm of prostate: Secondary | ICD-10-CM | POA: Diagnosis not present

## 2018-04-23 DIAGNOSIS — Z8546 Personal history of malignant neoplasm of prostate: Secondary | ICD-10-CM

## 2018-05-20 DIAGNOSIS — M79674 Pain in right toe(s): Secondary | ICD-10-CM | POA: Diagnosis not present

## 2018-05-20 DIAGNOSIS — B351 Tinea unguium: Secondary | ICD-10-CM | POA: Diagnosis not present

## 2018-05-20 DIAGNOSIS — E1151 Type 2 diabetes mellitus with diabetic peripheral angiopathy without gangrene: Secondary | ICD-10-CM | POA: Diagnosis not present

## 2018-05-20 DIAGNOSIS — M79675 Pain in left toe(s): Secondary | ICD-10-CM | POA: Diagnosis not present

## 2018-05-20 DIAGNOSIS — L851 Acquired keratosis [keratoderma] palmaris et plantaris: Secondary | ICD-10-CM | POA: Diagnosis not present

## 2018-05-28 ENCOUNTER — Ambulatory Visit: Payer: Medicare Other | Admitting: Diagnostic Neuroimaging

## 2018-06-12 ENCOUNTER — Other Ambulatory Visit: Payer: Self-pay | Admitting: Endocrinology

## 2018-06-14 DIAGNOSIS — H524 Presbyopia: Secondary | ICD-10-CM | POA: Diagnosis not present

## 2018-06-17 DIAGNOSIS — G4733 Obstructive sleep apnea (adult) (pediatric): Secondary | ICD-10-CM | POA: Diagnosis not present

## 2018-06-17 DIAGNOSIS — I10 Essential (primary) hypertension: Secondary | ICD-10-CM | POA: Diagnosis not present

## 2018-06-17 DIAGNOSIS — E669 Obesity, unspecified: Secondary | ICD-10-CM | POA: Diagnosis not present

## 2018-06-17 DIAGNOSIS — I4891 Unspecified atrial fibrillation: Secondary | ICD-10-CM | POA: Diagnosis not present

## 2018-06-18 ENCOUNTER — Other Ambulatory Visit: Payer: Self-pay

## 2018-06-18 ENCOUNTER — Ambulatory Visit (INDEPENDENT_AMBULATORY_CARE_PROVIDER_SITE_OTHER): Payer: Medicare Other | Admitting: Endocrinology

## 2018-06-18 ENCOUNTER — Encounter: Payer: Self-pay | Admitting: Endocrinology

## 2018-06-18 VITALS — BP 142/80 | HR 58 | Ht 70.0 in | Wt 223.6 lb

## 2018-06-18 DIAGNOSIS — D638 Anemia in other chronic diseases classified elsewhere: Secondary | ICD-10-CM | POA: Diagnosis not present

## 2018-06-18 DIAGNOSIS — I10 Essential (primary) hypertension: Secondary | ICD-10-CM | POA: Diagnosis not present

## 2018-06-18 DIAGNOSIS — Z794 Long term (current) use of insulin: Secondary | ICD-10-CM

## 2018-06-18 DIAGNOSIS — E1165 Type 2 diabetes mellitus with hyperglycemia: Secondary | ICD-10-CM | POA: Diagnosis not present

## 2018-06-18 LAB — COMPREHENSIVE METABOLIC PANEL
ALT: 11 U/L (ref 0–53)
AST: 12 U/L (ref 0–37)
Albumin: 4.1 g/dL (ref 3.5–5.2)
Alkaline Phosphatase: 43 U/L (ref 39–117)
BUN: 27 mg/dL — ABNORMAL HIGH (ref 6–23)
CO2: 27 mEq/L (ref 19–32)
Calcium: 9.8 mg/dL (ref 8.4–10.5)
Chloride: 105 mEq/L (ref 96–112)
Creatinine, Ser: 1.13 mg/dL (ref 0.40–1.50)
GFR: 62.81 mL/min (ref >60.00)
Glucose, Bld: 107 mg/dL — ABNORMAL HIGH (ref 70–99)
Potassium: 4.4 mEq/L (ref 3.5–5.1)
Sodium: 139 mEq/L (ref 135–145)
Total Bilirubin: 0.4 mg/dL (ref 0.2–1.2)
Total Protein: 6.8 g/dL (ref 6.0–8.3)

## 2018-06-18 LAB — CBC WITH DIFFERENTIAL/PLATELET
Basophils Absolute: 0 10*3/uL (ref 0.0–0.1)
Basophils Relative: 0.5 % (ref 0.0–3.0)
Eosinophils Absolute: 0.1 10*3/uL (ref 0.0–0.7)
Eosinophils Relative: 2.3 % (ref 0.0–5.0)
HEMATOCRIT: 36.2 % — AB (ref 39.0–52.0)
Hemoglobin: 12 g/dL — ABNORMAL LOW (ref 13.0–17.0)
Lymphocytes Relative: 26.1 % (ref 12.0–46.0)
Lymphs Abs: 1.7 10*3/uL (ref 0.7–4.0)
MCHC: 33.1 g/dL (ref 30.0–36.0)
MCV: 93.8 fl (ref 78.0–100.0)
Monocytes Absolute: 0.6 10*3/uL (ref 0.1–1.0)
Monocytes Relative: 9.9 % (ref 3.0–12.0)
Neutro Abs: 3.9 10*3/uL (ref 1.4–7.7)
Neutrophils Relative %: 61.2 % (ref 43.0–77.0)
PLATELETS: 219 10*3/uL (ref 150.0–400.0)
RBC: 3.86 Mil/uL — ABNORMAL LOW (ref 4.22–5.81)
RDW: 12.9 % (ref 11.5–15.5)
WBC: 6.4 10*3/uL (ref 4.0–10.5)

## 2018-06-18 LAB — POCT GLYCOSYLATED HEMOGLOBIN (HGB A1C): Hemoglobin A1C: 7.9 % — AB (ref 4.0–5.6)

## 2018-06-18 LAB — GLUCOSE, POCT (MANUAL RESULT ENTRY): POC Glucose: 92 mg/dl (ref 70–99)

## 2018-06-18 MED ORDER — FREESTYLE FREEDOM LITE W/DEVICE KIT: 1.0000 | PACK | Freq: Four times a day (QID) | 0 refills | Status: DC

## 2018-06-18 NOTE — Progress Notes (Signed)
Patient ID: Jonathan Hensley, male   DOB: [birth year], 78 y.o.   MRN: [id number]   Reason for Appointment: follow-up for various problems  History of Present Illness   PROBLEM 1   Type 2 diabetes mellitus, date of diagnosis: 1994.   He has been on insulin for the last few years, starting with only bedtime NPH and subsequently progressing to basal bolus regimen Also taking glipizide and metformin long-term  The HbgA1c was highest in 2013 at 8.2 Because of more fluctuation in his blood sugars at all times, difficulty with compliance and inconvenience of the multiple insulin dose regimen as well as needing higher doses of insulin he was started on the V.-go pump in 05/2013  Recent history:   The insulin regimen is: Basal rate of 20 units on V- go pump; boluses 2-4 units 2-3 times a day Oral hypoglycemic drugs: Metformin twice a day  His A1c has been gradually increasing and now 7.9  Current blood sugar patterns and problems identified:  He is complaining that sometimes if he will accidentally hit his V-go pump on his abdomen it might come loose and he will have to change it  However he has not tried any other sites  He is wanting to use a different method of controlling his diabetes but is forgetting that he had poor control when he was tried on Lantus and NovoLog  He also thinks that his blood sugar is worse in the last 2 weeks although review of his monitor indicates only sporadic high readings in the evenings as before  Also not clear why his blood sugar was 200 this morning even though fasting readings are usually fairly good, he thinks he bolus last evening as indicated for his dinner with 2 clicks  FASTING readings are lower on an average compared to his last visit as also evening readings  This may be from increasing metformin to twice a day  Blood sugar in the office is 92 even though it was 200 this morning and he is sure that he did not do a correction bolus  He  does not report any hypoglycemia  Usually not checking blood sugars at lunchtime and he thinks he does not eat a meal during the day  WEIGHT: This appears to have increased and he does not know why, he still thinks he is eating small portions and not excessive snacks As before history is difficult to obtain since he has memory issues  Glucose monitoring: Freestyle    Blood Glucose readings from download   PRE-MEAL Fasting Lunch Dinner Bedtime Overall  Glucose range:  75- 200  168  75-225    Mean/median:  123   180   150   POST-MEAL PC Breakfast PC Lunch PC Dinner  Glucose range:    205  Mean/median:      PREVIOUS readings  PRE-MEAL Fasting Lunch Dinner Bedtime Overall  Glucose range:  101-221   137-255    Mean/median: 159   200  166   POST-MEAL PC Breakfast PC Lunch PC Dinner  Glucose range:   ?  Mean/median:         Wt Readings from Last 3 Encounters:  06/18/18 223 lb 9.6 oz (101.4 kg)  03/26/18 217 lb (98.4 kg)  12/21/17 217 lb (98.4 kg)   Meals: 2-3 meals per day and sometimes no lunch.    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 Always use your most recent med list.        benazepril 40 MG  tablet Commonly known as:  LOTENSIN Take 40 mg by mouth at bedtime.   ELIQUIS 5 MG Tabs tablet Generic drug:  apixaban Take 5 mg by mouth 2 (two) times daily.   ezetimibe 10 MG tablet Commonly known as:  ZETIA Take 10 mg by mouth at bedtime.   freestyle lancets Use as instructed to check blood sugar 4 times per day dx code 250.00   FREESTYLE LITE test strip Generic drug:  glucose blood USE TO CHECK BLOOD SUGAR FOUR TIMES A DAY AS INSTRUCTED   meclizine 12.5 MG tablet Commonly known as:  ANTIVERT Take 1 tablet (12.5 mg total) by mouth 3 (three) times daily as needed for dizziness.   metFORMIN 500 MG 24 hr tablet Commonly known as:  GLUCOPHAGE-XR TAKE 3 TABLETS DAILY WITH SUPPER   NOVOLOG 100 UNIT/ML injection Generic drug:  insulin aspart USE UP TO 56 UNITS IN V-GO PUMP DAILY AS DIRECTED   simvastatin 20 MG tablet Commonly known as:  ZOCOR Take 20 mg by mouth at bedtime.       Allergies:  Allergies  Allergen Reactions  . Other Other (See Comments)    Beta or alpha blockers: severe dizziness  . Tramadol Other (See Comments)    Dizziness     Past Medical History:  Diagnosis Date  . Arthritis    rheumatoid in his hands  . Atrial fibrillation ([place])   . Bulging lumbar disc   . Cancer ([place])   . DDD (degenerative disc disease), cervical   . Diabetes mellitus   . Diabetic retinopathy ([place])   . Double vision   . Dysrhythmia    Atrial Fibrillation  . History of prostate cancer   . Hypertension   . Low back pain   . Nephrolithiasis   . Sleep apnea    uses cpap    Past Surgical History:  Procedure Laterality Date  . APPENDECTOMY    . BACK SURGERY    . Bilateral L4-5 lumbar decompression including bilateral  11/2008  . brachytherapy    . COLONOSCOPY    . CYSTOSCOPY    . Left L4-L5 lumbar laminotomy and microdiskectomy with  07/14/2008    . LITHOTRIPSY    . LUMBAR LAMINECTOMY/DECOMPRESSION MICRODISCECTOMY Left 08/18/2013   Procedure: Left Lumbar  Five-Sacral One Microdiskectomy;  Surgeon: [name], MD;  Location: [facility];  Service: Neurosurgery;  Laterality: Left;  Left Lumbar Five-Sacral One Microdiskectomy  . rotator cuff surgery    . Scleral buckle, laser photocoagulation,  and anterior chamber  01/04/2009    Family History  Problem Relation Age of Onset  . Heart failure Father   . Heart failure Mother   . Heart failure Brother        Half brother.  . Colon cancer Neg Hx     Social History:  reports that he quit smoking about 45 years ago. He has never used smokeless tobacco. He reports that he does not drink alcohol or use drugs.   Review of Systems  Dementia: He has been referred to the neurologist because of difficulty with word finding and continuing his sentence. He thinks he tried Aricept and it caused dizziness Has not followed up with neurologist as recommended since 1/19, he says that they did not do anything for him     Hypertension:  Blood pressure is being treated  with benazepril 40 mg, also followed by cardiologist  He is checking at home with a wrist unit   BP Readings from Last 3 Encounters:  06/18/18 (!) 142/80  03/26/18 138/68  12/21/17 (!) 152/84     He was taking sodium bicarbonate for his history of kidney stones from urologist  HYPERLIPIDEMIA: Has been on simvastatin and Zetia with the following results:  Lab Results  Component Value Date   CHOL 127 12/21/2017   HDL 49.50 12/21/2017   [place] 53 12/21/2017   TRIG 125.0 12/21/2017   CHOLHDL 3 12/21/2017     He has had retinal detachment treated surgically in the past, followed by ophthalmologist regularly  Has decreased vision in the left eye   Has had sleep apnea,  on CPAP   Anemia :  This has been usually mild and stable  On B12 supplements He thinks he is taking iron but not clear how often, last iron saturation was just below normal  He has refused to go for colonoscopy in the past  Lab Results  Component  Value Date   WBC 7.5 03/26/2018   HGB 12.2 (L) 03/26/2018   HCT 36.9 (L) 03/26/2018   MCV 93.3 03/26/2018   PLT 249.0 03/26/2018   Lab Results  Component Value Date   VITAMINB12 223 09/20/2017    Lab Results  Component Value Date   OCCULTBLD POSITIVE (A) 06/13/2016   OCCULTBLD Negative 03/22/2015       Examination:   BP (!) 142/80 (BP Location: Left Arm, Patient Position: Sitting, Cuff Size: Normal)   Pulse (!) 58   Ht 5\' 10"  (1.778 m)   Wt 223 lb 9.6 oz (101.4 kg)   SpO2 98%   BMI 32.08 kg/m   Body mass index is 32.08 kg/m.     Assesment:     Diabetes type 2 on insulin with the V-go pump  See history of present illness for  discussion of current blood sugar patterns, problems identified and day-to-day management  His A1c is 7.9  Not clear why his A1c is rising even though his average blood sugar at home is better Patient thinks that his meter is not working well and difficult to correlate this Although his sugar was high fasting this morning it was lower in the office about 3 hours later With his dementia difficult to be sure if he is following his diet, bolusing at mealtimes Blood sugars are variable in the evening and not clear if some of the readings are after meals He thinks he is bolusing before eating as before  He is complaining about the potential shortage of the V-go pump supply  However he has plenty at home now Also discussed other options such as the OmniPod but he is not going to do this because of the complexity Reassured him that he is on the optimal treatment for his diabetes since he cannot cope with multiple injections He does need to rotate the sites of his V-go application and try the arm also ,showed him where to apply the V-go He will call if he is running short of the V-go supplies Will also give him a new meter in case his present meter is not accurate  HYPERTENSION: Blood pressure is consistently controlled now He is only using a  wrist instrument and not clear how accurate this is He can continue to follow-up with his cardiologist periodically also  ANEMIA: Labs to be rechecked today for CBC  Counseling time on subjects discussed in assessment and plan sections is over 50% of today's 25 minute visit  Patient Instructions  Check blood sugars on waking up 7 days a week  Also check blood sugars about 2 hours after meals and do this after different meals by rotation  Recommended blood sugar levels on waking up are 90-130 and about 2 hours after meal is 130-180  Please bring your blood sugar monitor to each visit, thank you  Iron pills 3 days a week        [name] 06/18/2018, 9:26 AM

## 2018-06-18 NOTE — Patient Instructions (Addendum)
Check blood sugars on waking up 7 days a week  Also check blood sugars about 2 hours after meals and do this after different meals by rotation  Recommended blood sugar levels on waking up are 90-130 and about 2 hours after meal is 130-180  Please bring your blood sugar monitor to each visit, thank you  Iron pills 3 days a week  May use V-go on arm

## 2018-07-02 ENCOUNTER — Ambulatory Visit: Payer: Medicare Other | Admitting: Endocrinology

## 2018-07-08 ENCOUNTER — Telehealth: Payer: Self-pay | Admitting: Endocrinology

## 2018-07-08 NOTE — Telephone Encounter (Signed)
Patient requests a referral to [facility], [place], [place] ph# [phone] due to the above office is located much closer to patient's home

## 2018-07-08 NOTE — Telephone Encounter (Signed)
error 

## 2018-07-11 ENCOUNTER — Other Ambulatory Visit: Payer: Self-pay | Admitting: Endocrinology

## 2018-07-11 DIAGNOSIS — Z794 Long term (current) use of insulin: Principal | ICD-10-CM

## 2018-07-11 DIAGNOSIS — E1165 Type 2 diabetes mellitus with hyperglycemia: Secondary | ICD-10-CM

## 2018-07-11 NOTE — Telephone Encounter (Signed)
Referral has been made.

## 2018-09-04 ENCOUNTER — Ambulatory Visit: Payer: Medicare Other | Admitting: "Endocrinology

## 2018-09-16 DIAGNOSIS — I4891 Unspecified atrial fibrillation: Secondary | ICD-10-CM | POA: Diagnosis not present

## 2018-09-16 DIAGNOSIS — G4733 Obstructive sleep apnea (adult) (pediatric): Secondary | ICD-10-CM | POA: Diagnosis not present

## 2018-09-16 DIAGNOSIS — I10 Essential (primary) hypertension: Secondary | ICD-10-CM | POA: Diagnosis not present

## 2018-09-19 ENCOUNTER — Ambulatory Visit: Payer: Medicare Other | Admitting: "Endocrinology

## 2018-09-30 ENCOUNTER — Ambulatory Visit (INDEPENDENT_AMBULATORY_CARE_PROVIDER_SITE_OTHER): Payer: Medicare Other | Admitting: "Endocrinology

## 2018-09-30 ENCOUNTER — Other Ambulatory Visit: Payer: Self-pay

## 2018-09-30 ENCOUNTER — Encounter: Payer: Self-pay | Admitting: "Endocrinology

## 2018-09-30 VITALS — BP 105/63 | HR 62 | Ht 70.0 in | Wt 216.0 lb

## 2018-09-30 DIAGNOSIS — E559 Vitamin D deficiency, unspecified: Secondary | ICD-10-CM | POA: Diagnosis not present

## 2018-09-30 DIAGNOSIS — I10 Essential (primary) hypertension: Secondary | ICD-10-CM | POA: Diagnosis not present

## 2018-09-30 DIAGNOSIS — E1165 Type 2 diabetes mellitus with hyperglycemia: Secondary | ICD-10-CM | POA: Insufficient documentation

## 2018-09-30 DIAGNOSIS — E782 Mixed hyperlipidemia: Secondary | ICD-10-CM

## 2018-09-30 MED ORDER — METFORMIN HCL ER 500 MG PO TB24: 500.0000 mg | ORAL_TABLET | Freq: Two times a day (BID) | ORAL | 1 refills | Status: DC

## 2018-09-30 NOTE — Progress Notes (Signed)
Endocrinology Consult Note       09/30/2018, 9:41 AM   Subjective:    Patient ID: Jonathan Hensley, male    DOB: [birth year].  Jonathan Hensley is being seen in consultation for management of currently uncontrolled symptomatic diabetes requested by  [name], MD.   Past Medical History:  Diagnosis Date  . Arthritis    rheumatoid in his hands  . Atrial fibrillation ([place])   . Bulging lumbar disc   . Cancer (Jonathan Hensley)   . DDD (degenerative disc disease), cervical   . Diabetes mellitus   . Diabetic retinopathy (Jonathan Hensley)   . Double vision   . Dysrhythmia    Atrial Fibrillation  . History of prostate cancer   . Hypertension   . Low back pain   . Nephrolithiasis   . Sleep apnea    uses cpap    Past Surgical History:  Procedure Laterality Date  . APPENDECTOMY    . BACK SURGERY    . Bilateral L4-5 lumbar decompression including bilateral  11/2008  . brachytherapy    . COLONOSCOPY    . CYSTOSCOPY    . Left L4-L5 lumbar laminotomy and microdiskectomy with  07/14/2008    . LITHOTRIPSY    . LUMBAR LAMINECTOMY/DECOMPRESSION MICRODISCECTOMY Left 08/18/2013   Procedure: Left Lumbar Five-Sacral One Microdiskectomy;  Surgeon: Jonathan Spangle, MD;  Location: [facility];  Service: Neurosurgery;  Laterality: Left;  Left Lumbar Five-Sacral One Microdiskectomy  . rotator cuff surgery    . Scleral buckle, laser photocoagulation, and anterior chamber  01/04/2009    Social History   Socioeconomic History  . Marital status: Married    Spouse name: Jonathan Hensley  . Number of children: 2  . Years of education: 9  . Highest education level: Not on file  Occupational History  . Not on file  Social Needs  . Financial resource strain: Not on file  . Food insecurity:    Worry: Not on file    Inability: Not on file  . Transportation needs:    Medical: Not on file    Non-medical: Not on file  Tobacco Use  . Smoking  status: Former Smoker    Last attempt to quit: 02/12/1973    Years since quitting: 45.6  . Smokeless tobacco: Never Used  Substance and Sexual Activity  . Alcohol use: No  . Drug use: No  . Sexual activity: Yes  Lifestyle  . Physical activity:    Days per week: Not on file    Minutes per session: Not on file  . Stress: Not on file  Relationships  . Social connections:    Talks on phone: Not on file    Gets together: Not on file    Attends religious service: Not on file    Active member of club or organization: Not on file    Attends meetings of clubs or organizations: Not on file    Relationship status: Not on file  Other Topics Concern  . Not on file  Social History Narrative   Lives at home with wife   Drinks 4 cups of coffee a day  Family History  Problem Relation Age of Onset  . Heart failure Father   . Heart failure Mother   . Heart failure Brother        Half brother.  . Colon cancer Neg Hx     Outpatient Encounter Medications as of 09/30/2018  Medication Sig  . apixaban (ELIQUIS) 5 MG TABS tablet Take 5 mg by mouth 2 (two) times daily.  . benazepril (LOTENSIN) 40 MG tablet Take 40 mg by mouth at bedtime.   . Blood Glucose Monitoring Suppl (FREESTYLE FREEDOM LITE) w/Device KIT 1 each by Does not apply route 4 (four) times daily. USE BLOOD GLUCOSE MONITOR TO CHECK BLOOD SUGAR.  Marland Kitchen ezetimibe (ZETIA) 10 MG tablet Take 10 mg by mouth at bedtime.   Marland Kitchen FREESTYLE LITE test strip USE TO CHECK BLOOD SUGAR FOUR TIMES A DAY AS INSTRUCTED  . Lancets (FREESTYLE) lancets Use as instructed to check blood sugar 4 times per day dx code 250.00  . metFORMIN (GLUCOPHAGE-XR) 500 MG 24 hr tablet Take 1 tablet (500 mg total) by mouth 2 (two) times daily with a meal.  . NOVOLOG 100 UNIT/ML injection USE UP TO 56 UNITS IN V-GO PUMP DAILY AS DIRECTED  . simvastatin (ZOCOR) 20 MG tablet Take 20 mg by mouth at bedtime.   . [DISCONTINUED] meclizine (ANTIVERT) 12.5 MG tablet Take 1 tablet (12.5  mg total) by mouth 3 (three) times daily as needed for dizziness.  . [DISCONTINUED] metFORMIN (GLUCOPHAGE-XR) 500 MG 24 hr tablet TAKE 3 TABLETS DAILY WITH SUPPER   No facility-administered encounter medications on file as of 09/30/2018.     ALLERGIES: Allergies  Allergen Reactions  . Other Other (See Comments)    Beta or alpha blockers: severe dizziness  . Tramadol Other (See Comments)    Dizziness     VACCINATION STATUS: Immunization History  Administered Date(s) Administered  . Influenza Split 02/12/2013  . Influenza, High Dose Seasonal PF 03/26/2018  . Influenza,inj,Quad PF,6+ Mos 02/26/2015  . Influenza-Unspecified 02/17/2014  . Pneumococcal Conjugate-13 11/26/2014  . Tdap 07/21/2012    Diabetes  He presents for his initial diabetic visit. He has type 2 diabetes mellitus. Onset time: He was diagnosed at approximate age of 59 years. His disease course has been stable. There are no hypoglycemic associated symptoms. Pertinent negatives for hypoglycemia include no confusion, headaches, pallor or seizures. There are no diabetic associated symptoms. Pertinent negatives for diabetes include no chest pain, no fatigue, no polydipsia, no polyphagia, no polyuria and no weakness. There are no hypoglycemic complications. Symptoms are stable. Diabetic complications include retinopathy. Risk factors for coronary artery disease include dyslipidemia, family history, male sex, sedentary lifestyle and tobacco exposure. Current diabetic treatment includes insulin injections and oral agent (monotherapy) (He is currently on insulin utilizing V-go, as well as metformin 500 mg ER twice daily.). His weight is fluctuating minimally. He is following a generally unhealthy diet. When asked about meal planning, he reported none. He has not had a previous visit with a dietitian. He participates in exercise intermittently. (His average blood glucose is 162 for the last 7 days, recent A1c was 7.9%.) An ACE  inhibitor/angiotensin II receptor blocker is being taken. Eye exam is current.  Hyperlipidemia  This is a chronic problem. The current episode started more than 1 year ago. The problem is controlled. Exacerbating diseases include diabetes and obesity. Pertinent negatives include no chest pain, myalgias or shortness of breath. Current antihyperlipidemic treatment includes statins. Risk factors for coronary artery disease include diabetes mellitus,  dyslipidemia, hypertension, male sex and a sedentary lifestyle.  Hypertension  This is a chronic problem. The current episode started more than 1 year ago. Pertinent negatives include no chest pain, headaches, neck pain, palpitations or shortness of breath. Risk factors for coronary artery disease include dyslipidemia, diabetes mellitus, male gender, obesity, sedentary lifestyle and smoking/tobacco exposure. Past treatments include ACE inhibitors. Hypertensive end-organ damage includes retinopathy.     Review of Systems  Constitutional: Negative for chills, fatigue, fever and unexpected weight change.  HENT: Negative for dental problem, mouth sores and trouble swallowing.   Eyes: Negative for visual disturbance.  Respiratory: Negative for cough, choking, chest tightness, shortness of breath and wheezing.   Cardiovascular: Negative for chest pain, palpitations and leg swelling.  Gastrointestinal: Negative for abdominal distention, abdominal pain, constipation, diarrhea, nausea and vomiting.  Endocrine: Negative for polydipsia, polyphagia and polyuria.  Genitourinary: Negative for dysuria, flank pain, hematuria and urgency.  Musculoskeletal: Negative for back pain, gait problem, myalgias and neck pain.  Skin: Negative for pallor, rash and wound.  Neurological: Negative for seizures, syncope, weakness, numbness and headaches.  Psychiatric/Behavioral: Negative for confusion and dysphoric mood.    Objective:    BP 105/63   Pulse 62   Ht _0  (1.778  m)   Wt 216 lb (98 kg)   BMI 30.99 kg/m   Wt Readings from Last 3 Encounters:  09/30/18 216 lb (98 kg)  06/18/18 223 lb 9.6 oz (101.4 kg)  03/26/18 217 lb (98.4 kg)     Physical Exam Constitutional:      General: He is not in acute distress.    Appearance: He is well-developed.  HENT:     Head: Normocephalic and atraumatic.  Neck:     Musculoskeletal: Normal range of motion and neck supple.     Thyroid: No thyromegaly.     Trachea: No tracheal deviation.  Cardiovascular:     Rate and Rhythm: Normal rate.     Pulses:          Dorsalis pedis pulses are 1+ on the right side and 1+ on the left side.       Posterior tibial pulses are 1+ on the right side and 1+ on the left side.     Heart sounds: Normal heart sounds, S1 normal and S2 normal. No murmur. No gallop.   Pulmonary:     Effort: Pulmonary effort is normal. No respiratory distress.     Breath sounds: No wheezing.  Abdominal:     General: There is no distension.     Tenderness: There is no abdominal tenderness. There is no guarding.  Musculoskeletal:     Right shoulder: He exhibits no swelling and no deformity.  Skin:    General: Skin is warm and dry.     Findings: No rash.     Nails: There is no clubbing.   Neurological:     Mental Status: He is alert and oriented to person, place, and time.     Cranial Nerves: No cranial nerve deficit.     Sensory: No sensory deficit.     Gait: Gait normal.     Deep Tendon Reflexes: Reflexes are normal and symmetric.  Psychiatric:        Mood and Affect: Mood normal.        Speech: Speech normal.        Behavior: Behavior normal. Behavior is cooperative.        Thought Content: Thought content normal.  Judgment: Judgment normal.       CMP ( most recent) CMP     Component Value Date/Time   NA 139 06/18/2018 0941   K 4.4 06/18/2018 0941   CL 105 06/18/2018 0941   CO2 27 06/18/2018 0941   GLUCOSE 107 (H) 06/18/2018 0941   BUN 27 (H) 06/18/2018 0941   CREATININE  1.13 06/18/2018 0941   CREATININE 1.08 06/02/2013 1243   CALCIUM 9.8 06/18/2018 0941   PROT 6.8 06/18/2018 0941   ALBUMIN 4.1 06/18/2018 0941   AST 12 06/18/2018 0941   ALT 11 06/18/2018 0941   ALKPHOS 43 06/18/2018 0941   BILITOT 0.4 06/18/2018 0941   GFRNONAA 50 (L) 06/14/2016 0426   GFRAA 58 (L) 06/14/2016 0426     Diabetic Labs (most recent): Lab Results  Component Value Date   HGBA1C 7.9 (A) 06/18/2018   HGBA1C 7.4 (A) 03/26/2018   HGBA1C 7.5 (A) 12/21/2017     Lipid Panel ( most recent) Lipid Panel     Component Value Date/Time   CHOL 127 12/21/2017 1053   TRIG 125.0 12/21/2017 1053   HDL 49.50 12/21/2017 1053   CHOLHDL 3 12/21/2017 1053   VLDL 25.0 12/21/2017 1053   LDLCALC 53 12/21/2017 1053      Lab Results  Component Value Date   TSH 3.27 12/21/2017   TSH 2.60 06/11/2017   TSH 2.36 05/22/2016   TSH 2.18 06/16/2015   TSH 2.370 12/19/2011   FREET4 0.92 06/11/2017   FREET4 0.95 05/22/2016   FREET4 0.92 06/16/2015      Assessment & Plan:   1. Uncontrolled type 2 diabetes mellitus with hyperglycemia ([place])  - Jonathan Hensley has currently uncontrolled symptomatic type 2 DM since  78 years of age,  with most recent A1c of 7.9 %. Recent labs reviewed. - I had a long discussion with him about the progressive nature of diabetes and the pathology behind its complications. -his diabetes is complicated by retinopathy and he remains at a high risk for more acute and chronic complications which include CAD, CVA, CKD, retinopathy, and neuropathy. These are all discussed in detail with him.  - I have counseled him on diet management and weight loss, by adopting a carbohydrate restricted/protein rich diet. - he admits that there is a room for improvement in his food and drink choices. - Suggestion is made for him to avoid simple carbohydrates  from his diet including Cakes, Sweet Desserts, Ice Cream, Soda (diet and regular), Sweet Tea, Candies, Chips, Cookies, Store  Bought Juices, Alcohol in Excess of  1-2 drinks a day, Artificial Sweeteners,  Coffee Creamer, and "Sugar-free" Products. This will help patient to have more stable blood glucose profile and potentially avoid unintended weight gain.  - I encouraged him to switch to  unprocessed or minimally processed complex starch and increased protein intake (animal or plant source), fruits, and vegetables.  - he is advised to stick to a routine mealtimes to eat 3 meals  a day and avoid unnecessary snacks ( to snack only to correct hypoglycemia).   - he will be scheduled with Jearld Fenton, RDN, CDE for individualized diabetes education.  - I have approached him with the following individualized plan to manage diabetes and patient agrees:   -Patient expresses dissatisfaction with his insulin delivery with V-go , and would like to come off of it.  The #1 priority in treating his diabetes is to avoid hypoglycemia, and he will benefit from simplified treatment regimen. -In preparation,  I advised him to start monitoring of blood glucose strictly 4 times a day before meals and at bedtime document on a log sheet and return in 10 days with his meter and logs. - he is encouraged to call clinic for blood glucose levels less than 70 or above 300 mg /dl. -He will be considered for basal insulin with long-acting insulin using insulin pen if he continues to require insulin treatment.   -He is advised to continue metformin 500 mg ER p.o. twice daily after breakfast and supper.   - he will be considered for incretin therapy as appropriate next visit.  - Patient specific target  A1c;  LDL, HDL, Triglycerides, and  Waist Circumference were discussed in detail.  2) Blood Pressure /Hypertension:  his blood pressure is  controlled to target.   he is advised to continue his current medications including benazepril 40 mg p.o. daily with breakfast . 3) Lipids/Hyperlipidemia:   Review of his recent lipid panel showed  controlled   LDL at 53 .  he  is advised to continue    Zocor 20 mg daily at bedtime, as well as Zetia 10 mg p.o. nightly.   Side effects and precautions discussed with him.  4)  Weight/Diet:  Body mass index is 30.99 kg/m.  -   clearly complicating his diabetes care.  I discussed with him the fact that loss of 5 - 10% of his  current body weight will have the most impact on his diabetes management.  CDE Consult will be initiated . Exercise, and detailed carbohydrates information provided  -  detailed on discharge instructions.  5) Chronic Care/Health Maintenance:  -he  is on ACEI/ARB and Statin medications and  is encouraged to initiate and continue to follow up with Ophthalmology, Dentist,  Podiatrist at least yearly or according to recommendations, and advised to   stay away from smoking. I have recommended yearly flu vaccine and pneumonia vaccine at least every 5 years; moderate intensity exercise for up to 150 minutes weekly; and  sleep for at least 7 hours a day.  - he is  advised to maintain close follow up with [name], MD for primary care needs, as well as his other providers for optimal and coordinated care.  - Time spent with the patient: 45 minutes, of which >50% was spent in obtaining information about his symptoms, reviewing his previous labs/studies, evaluations, and treatments, counseling him about his currently uncontrolled type 2 diabetes, hyperlipidemia, hypertension, and developing plans for long term treatment based on the latest standards of care/guidelines.  Please refer to " Patient Self Inventory" in the Media  tab for reviewed elements of pertinent patient history.  Jonathan Hensley participated in the discussions, expressed understanding, and voiced agreement with the above plans.  All questions were answered to his satisfaction. he is encouraged to contact clinic should he have any questions or concerns prior to his return visit.  Follow up plan: - Return in about 10 days (around  10/10/2018) for Labs Today- Non-Fasting Ok, Follow up with Pre-visit Labs, Meter, and Logs.  Glade Lloyd, MD [facility] [address] [place], [place] [address] Phone: [phone]  Fax: [phone]    09/30/2018, 9:41 AM  This note was partially dictated with voice recognition software. Similar sounding words can be transcribed inadequately or may not  be corrected upon review.

## 2018-09-30 NOTE — Patient Instructions (Signed)

## 2018-10-01 ENCOUNTER — Other Ambulatory Visit: Payer: Self-pay | Admitting: Cardiology

## 2018-10-01 LAB — COMPLETE METABOLIC PANEL WITH GFR
AG Ratio: 1.5 (calc) (ref 1.0–2.5)
ALT: 11 U/L (ref 9–46)
AST: 13 U/L (ref 10–35)
Albumin: 4.1 g/dL (ref 3.6–5.1)
Alkaline phosphatase (APISO): 46 U/L (ref 35–144)
BUN/Creatinine Ratio: 31 (calc) — ABNORMAL HIGH (ref 6–22)
BUN: 39 mg/dL — ABNORMAL HIGH (ref 7–25)
CO2: 27 mmol/L (ref 20–32)
Calcium: 9.5 mg/dL (ref 8.6–10.3)
Chloride: 110 mmol/L (ref 98–110)
Creat: 1.26 mg/dL — ABNORMAL HIGH (ref 0.70–1.18)
GFR, Est African American: 63 mL/min/{1.73_m2} (ref >=60)
GFR, Est Non African American: 55 mL/min/{1.73_m2} — ABNORMAL LOW (ref >=60)
Globulin: 2.8 g/dL (calc) (ref 1.9–3.7)
Glucose, Bld: 94 mg/dL (ref 65–99)
Potassium: 5.2 mmol/L (ref 3.5–5.3)
Sodium: 141 mmol/L (ref 135–146)
Total Bilirubin: 0.3 mg/dL (ref 0.2–1.2)
Total Protein: 6.9 g/dL (ref 6.1–8.1)

## 2018-10-01 LAB — LIPID PANEL
Cholesterol: 146 mg/dL (ref <200)
HDL: 49 mg/dL (ref >=40)
LDL Cholesterol (Calc): 78 mg/dL (calc)
Non-HDL Cholesterol (Calc): 97 mg/dL (calc) (ref <130)
Total CHOL/HDL Ratio: 3 (calc) (ref <5.0)
Triglycerides: 103 mg/dL (ref <150)

## 2018-10-01 LAB — MICROALBUMIN / CREATININE URINE RATIO
Creatinine, Urine: 149 mg/dL (ref 20–320)
Microalb Creat Ratio: 3 mcg/mg creat (ref <30)
Microalb, Ur: 0.4 mg/dL

## 2018-10-01 LAB — TSH: TSH: 4.8 mIU/L — ABNORMAL HIGH (ref 0.40–4.50)

## 2018-10-01 LAB — HEMOGLOBIN A1C
Hgb A1c MFr Bld: 8.1 % of total Hgb — ABNORMAL HIGH (ref <5.7)
Mean Plasma Glucose: 186 (calc)
eAG (mmol/L): 10.3 (calc)

## 2018-10-01 LAB — T4, FREE: Free T4: 0.9 ng/dL (ref 0.8–1.8)

## 2018-10-01 LAB — VITAMIN D 25 HYDROXY (VIT D DEFICIENCY, FRACTURES): Vit D, 25-Hydroxy: 39 ng/mL (ref 30–100)

## 2018-10-09 ENCOUNTER — Encounter: Payer: Medicare Other | Attending: "Endocrinology | Admitting: Nutrition

## 2018-10-09 ENCOUNTER — Other Ambulatory Visit: Payer: Self-pay

## 2018-10-09 DIAGNOSIS — E119 Type 2 diabetes mellitus without complications: Secondary | ICD-10-CM | POA: Insufficient documentation

## 2018-10-09 DIAGNOSIS — Z794 Long term (current) use of insulin: Secondary | ICD-10-CM | POA: Insufficient documentation

## 2018-10-09 NOTE — Progress Notes (Signed)
  Medical Nutrition Therapy:  Appt start time: 4163 end time:  1630.   Assessment:  Primary concerns today: [facility] Visit.  Testing blood sugars 4 x day. Sees Dr. Dorris Fetch, Endocrinology. Had seen [name] previously. BB 135-200    BL 167-323   BS    275-193    Bedtime 226-266 mg/dl. Hasn't been taking any insulin since  The 18th per Dr. Dorris Fetch. However, he will take some of his  insulin when is BS are near 300's. BP was 107/63  Eats 2-3 meals per day. Skips meals at lunchtime at times. Walks some around the house. Does some yard work.  Willing to make changes with diet and medications to improve his DM and reduce complications. He has retinopathy and Hypothyroidism.  Lab Results  Component Value Date   HGBA1C 8.1 (H) 09/30/2018   CMP Latest Ref Rng & Units 09/30/2018 06/18/2018 03/26/2018  Glucose 65 - 99 mg/dL 94 107(H) 106(H)  BUN 7 - 25 mg/dL 39(H) 27(H) 33(H)  Creatinine 0.70 - 1.18 mg/dL 1.26(H) 1.13 1.27  Sodium 135 - 146 mmol/L 141 139 140  Potassium 3.5 - 5.3 mmol/L 5.2 4.4 4.3  Chloride 98 - 110 mmol/L 110 105 105  CO2 20 - 32 mmol/L 27 27 27   Calcium 8.6 - 10.3 mg/dL 9.5 9.8 9.7  Total Protein 6.1 - 8.1 g/dL 6.9 6.8 7.5  Total Bilirubin 0.2 - 1.2 mg/dL 0.3 0.4 0.3  Alkaline Phos 39 - 117 U/L - 43 44  AST 10 - 35 U/L 13 12 16   ALT 9 - 46 U/L 11 11 12      Preferred Learning Style:   No preference indicated   Learning Readiness:   Ready  Change in progress   MEDICATIONS:   DIETARY INTAKE:  24-hr recall:  B ( AM):FBS:    eggs, coffee and hot tea, tea, Snk ( AM):  L ( PM): 3 slices pizza  Snk ( PM): D ( PM): Ham sandwich with lettuce, pumpbernieckle bread, swiss chieese, ,unsweet tea Snk ( PM):  Beverages: water, unsweet tea  Usual physical activity: None  Estimated energy needs: 1800  calories 200 g carbohydrates 135 g protein 50 g fat  Progress Towards Goal(s):  In progress.   Nutritional Diagnosis:  NB-1.1 Food and nutrition-related knowledge deficit  As related to Diabetes .  As evidenced by A1C .    Intervention:  Nutrition and Diabetes education provided on My Plate, CHO counting, meal planning, portion sizes, timing of meals, avoiding snacks between meals unless having a low blood sugar, target ranges for A1C and blood sugars, signs/symptoms and treatment of hyper/hypoglycemia, monitoring blood sugars, taking medications as prescribed, benefits of exercising 30 minutes per day and prevention of complications of DM.  Goals Follow My Plate Eat three meals per day at times discussed. Eat 3-4 carb choices per meal. Take insulin as  Dr. Liliane Channel recommendations. You need to increase your water intake to prevent dehydration. 6 bottles per day. Test BS per Dr. Liliane Channel recommendations.   Teaching Method Utilized: Visual Auditory Hands on  Handouts given during visit include:  Verbally went over The Plate Method    Barriers to learning/adherence to lifestyle change: none  Demonstrated degree of understanding via:  Teach Back   Monitoring/Evaluation:  Dietary intake, exercise, , and body weight in 1 month(s).

## 2018-10-11 ENCOUNTER — Telehealth: Payer: Self-pay

## 2018-10-11 ENCOUNTER — Ambulatory Visit (INDEPENDENT_AMBULATORY_CARE_PROVIDER_SITE_OTHER): Payer: Medicare Other | Admitting: "Endocrinology

## 2018-10-11 ENCOUNTER — Encounter: Payer: Self-pay | Admitting: "Endocrinology

## 2018-10-11 ENCOUNTER — Other Ambulatory Visit: Payer: Self-pay

## 2018-10-11 DIAGNOSIS — E1165 Type 2 diabetes mellitus with hyperglycemia: Secondary | ICD-10-CM

## 2018-10-11 DIAGNOSIS — E039 Hypothyroidism, unspecified: Secondary | ICD-10-CM

## 2018-10-11 DIAGNOSIS — E782 Mixed hyperlipidemia: Secondary | ICD-10-CM | POA: Diagnosis not present

## 2018-10-11 DIAGNOSIS — I10 Essential (primary) hypertension: Secondary | ICD-10-CM

## 2018-10-11 MED ORDER — INSULIN PEN NEEDLE 31G X 8 MM MISC: 1.0000 | 1 refills | Status: DC

## 2018-10-11 MED ORDER — LEVOTHYROXINE SODIUM 25 MCG PO TABS: 25.0000 ug | ORAL_TABLET | Freq: Every day | ORAL | 1 refills | Status: DC

## 2018-10-11 MED ORDER — INSULIN GLARGINE (2 UNIT DIAL) 300 UNIT/ML ~~LOC~~ SOPN: 20.0000 [IU] | PEN_INJECTOR | Freq: Every day | SUBCUTANEOUS | 1 refills | Status: DC

## 2018-10-11 MED ORDER — INSULIN GLARGINE (2 UNIT DIAL) 300 UNIT/ML ~~LOC~~ SOPN: 20.0000 [IU] | PEN_INJECTOR | Freq: Every day | SUBCUTANEOUS | 0 refills | Status: DC

## 2018-10-11 MED ORDER — INSULIN PEN NEEDLE 31G X 8 MM MISC: 1.0000 | 3 refills | Status: DC

## 2018-10-11 MED ORDER — LEVOTHYROXINE SODIUM 25 MCG PO TABS: 25.0000 ug | ORAL_TABLET | Freq: Every day | ORAL | 0 refills | Status: DC

## 2018-10-11 NOTE — Progress Notes (Signed)
10/11/2018, 11:49 AM                                                    Endocrinology Telehealth Visit Follow up Note -During COVID -19 Pandemic  This visit type was conducted due to national recommendations for restrictions regarding the COVID-19 Pandemic  in an effort to limit this patient's exposure and mitigate transmission of the corona virus.  Due to his co-morbid illnesses, Jonathan Hensley is at  moderate to high risk for complications without adequate follow up.  This format is felt to be most appropriate for him at this time.  I connected with this patient on 10/11/2018   by telephone and verified that I am speaking with the correct person using two identifiers. Jonathan Hensley, November 11, 1940. he has verbally consented to this visit. All issues noted in this document were discussed and addressed. The format was not optimal for physical exam.    Subjective:    Patient ID: Jonathan Hensley, male    DOB: Jul 18, 1940.  Jonathan Hensley is being seen in f/u for management of currently uncontrolled symptomatic diabetes. Previously seen by [name].  Past Medical History:  Diagnosis Date  . Arthritis    rheumatoid in his hands  . Atrial fibrillation ([place])   . Bulging lumbar disc   . Cancer ([place])   . DDD (degenerative disc disease), cervical   . Diabetes mellitus   . Diabetic retinopathy ([place])   . Double vision   . Dysrhythmia    Atrial Fibrillation  . History of prostate cancer   . Hypertension   . Low back pain   . Nephrolithiasis   . Sleep apnea    uses cpap    Past Surgical History:  Procedure Laterality Date  . APPENDECTOMY    . BACK SURGERY    . Bilateral L4-5 lumbar decompression including bilateral  11/2008  . brachytherapy    . COLONOSCOPY    . CYSTOSCOPY    . Left L4-L5 lumbar laminotomy and microdiskectomy with  07/14/2008    . LITHOTRIPSY    . LUMBAR LAMINECTOMY/DECOMPRESSION  MICRODISCECTOMY Left 08/18/2013   Procedure: Left Lumbar Five-Sacral One Microdiskectomy;  Surgeon: [name], MD;  Location: [facility];  Service: Neurosurgery;  Laterality: Left;  Left Lumbar Five-Sacral One Microdiskectomy  . rotator cuff surgery    . Scleral buckle, laser photocoagulation, and anterior chamber  01/04/2009    Social History   Socioeconomic History  . Marital status: Married    Spouse name: [name]  . Number of children: 2  . Years of education: 33  . Highest education level: Not on file  Occupational History  . Not on file  Social Needs  . Financial resource strain: Not on file  . Food insecurity:    Worry: Not on file    Inability: Not on file  . Transportation needs:    Medical: Not on file  Non-medical: Not on file  Tobacco Use  . Smoking status: Former Smoker    Last attempt to quit: 02/12/1973    Years since quitting: 45.6  . Smokeless tobacco: Never Used  Substance and Sexual Activity  . Alcohol use: No  . Drug use: No  . Sexual activity: Yes  Lifestyle  . Physical activity:    Days per week: Not on file    Minutes per session: Not on file  . Stress: Not on file  Relationships  . Social connections:    Talks on phone: Not on file    Gets together: Not on file    Attends religious service: Not on file    Active member of club or organization: Not on file    Attends meetings of clubs or organizations: Not on file    Relationship status: Not on file  Other Topics Concern  . Not on file  Social History Narrative   Lives at home with wife   Drinks 4 cups of coffee a day    Family History  Problem Relation Age of Onset  . Heart failure Father   . Heart failure Mother   . Heart failure Brother        Half brother.  . Colon cancer Neg Hx     Outpatient Encounter Medications as of 10/11/2018  Medication Sig  . benazepril (LOTENSIN) 40 MG tablet Take 40 mg by mouth at bedtime.   . Blood Glucose Monitoring Suppl (FREESTYLE  FREEDOM LITE) w/Device KIT 1 each by Does not apply route 4 (four) times daily. USE BLOOD GLUCOSE MONITOR TO CHECK BLOOD SUGAR.  Marland Kitchen ELIQUIS 5 MG TABS tablet TAKE 1 TABLET TWICE A DAY  . ezetimibe (ZETIA) 10 MG tablet TAKE 1 TABLET EVERY EVENING AFTER DINNER  . FREESTYLE LITE test strip USE TO CHECK BLOOD SUGAR FOUR TIMES A DAY AS INSTRUCTED  . Insulin Glargine, 2 Unit Dial, (TOUJEO MAX SOLOSTAR) 300 UNIT/ML SOPN Inject 20 Units into the skin at bedtime.  . Lancets (FREESTYLE) lancets Use as instructed to check blood sugar 4 times per day dx code 250.00  . levothyroxine (SYNTHROID) 25 MCG tablet Take 1 tablet (25 mcg total) by mouth daily before breakfast.  . metFORMIN (GLUCOPHAGE-XR) 500 MG 24 hr tablet Take 1 tablet (500 mg total) by mouth 2 (two) times daily with a meal.  . NOVOLOG 100 UNIT/ML injection USE UP TO 56 UNITS IN V-GO PUMP DAILY AS DIRECTED  . simvastatin (ZOCOR) 20 MG tablet TAKE 1 TABLET EVERY EVENING AFTER DINNER  . [DISCONTINUED] Insulin Glargine, 2 Unit Dial, (TOUJEO MAX SOLOSTAR) 300 UNIT/ML SOPN Inject 20 Units into the skin at bedtime.  . [DISCONTINUED] Insulin Pen Needle (B-D ULTRAFINE III SHORT PEN) 31G X 8 MM MISC 1 each by Does not apply route as directed.  . [DISCONTINUED] Insulin Pen Needle (B-D ULTRAFINE III SHORT PEN) 31G X 8 MM MISC 1 each by Does not apply route as directed.  . [DISCONTINUED] levothyroxine (SYNTHROID) 25 MCG tablet Take 1 tablet (25 mcg total) by mouth daily before breakfast.   No facility-administered encounter medications on file as of 10/11/2018.     ALLERGIES: Allergies  Allergen Reactions  . Other Other (See Comments)    Beta or alpha blockers: severe dizziness  . Tramadol Other (See Comments)    Dizziness     VACCINATION STATUS: Immunization History  Administered Date(s) Administered  . Influenza Split 02/12/2013  . Influenza, High Dose Seasonal PF 03/26/2018  . Influenza,inj,Quad  PF,6+ Mos 02/26/2015  . Influenza-Unspecified  02/17/2014  . Pneumococcal Conjugate-13 11/26/2014  . Tdap 07/21/2012    Diabetes  He presents for his follow-up diabetic visit. He has type 2 diabetes mellitus. Onset time: He was diagnosed at approximate age of 78 years. His disease course has been improving. There are no hypoglycemic associated symptoms. Pertinent negatives for hypoglycemia include no confusion, headaches, pallor or seizures. There are no diabetic associated symptoms. Pertinent negatives for diabetes include no chest pain, no fatigue, no polydipsia, no polyphagia, no polyuria and no weakness. There are no hypoglycemic complications. Symptoms are improving. Diabetic complications include retinopathy. Risk factors for coronary artery disease include dyslipidemia, family history, male sex, sedentary lifestyle and tobacco exposure. Current diabetic treatment includes insulin injections and oral agent (monotherapy) (He is currently on insulin utilizing V-go, as well as metformin 500 mg ER twice daily.). His weight is fluctuating minimally. He is following a generally unhealthy diet. When asked about meal planning, he reported none. He has not had a previous visit with a dietitian. He participates in exercise intermittently. His home blood glucose trend is increasing steadily. His breakfast blood glucose range is generally 140-180 mg/dl. His lunch blood glucose range is generally 140-180 mg/dl. His dinner blood glucose range is generally 140-180 mg/dl. His bedtime blood glucose range is generally 140-180 mg/dl. His overall blood glucose range is 140-180 mg/dl. (His average blood glucose is 162 for the last 7 days, recent A1c was 7.9%.) An ACE inhibitor/angiotensin II receptor blocker is being taken. Eye exam is current.  Hyperlipidemia  This is a chronic problem. The current episode started more than 1 year ago. The problem is controlled. Exacerbating diseases include diabetes and obesity. Pertinent negatives include no chest pain, myalgias or  shortness of breath. Current antihyperlipidemic treatment includes statins. Risk factors for coronary artery disease include diabetes mellitus, dyslipidemia, hypertension, male sex and a sedentary lifestyle.  Hypertension  This is a chronic problem. The current episode started more than 1 year ago. Pertinent negatives include no chest pain, headaches, neck pain, palpitations or shortness of breath. Risk factors for coronary artery disease include dyslipidemia, diabetes mellitus, male gender, obesity, sedentary lifestyle and smoking/tobacco exposure. Past treatments include ACE inhibitors. Hypertensive end-organ damage includes retinopathy.   Review of systems Limited as above.   Objective:    There were no vitals taken for this visit.  Wt Readings from Last 3 Encounters:  09/30/18 216 lb (98 kg)  06/18/18 223 lb 9.6 oz (101.4 kg)  03/26/18 217 lb (98.4 kg)     CMP ( most recent) CMP     Component Value Date/Time   NA 141 09/30/2018 0918   K 5.2 09/30/2018 0918   CL 110 09/30/2018 0918   CO2 27 09/30/2018 0918   GLUCOSE 94 09/30/2018 0918   BUN 39 (H) 09/30/2018 0918   CREATININE 1.26 (H) 09/30/2018 0918   CALCIUM 9.5 09/30/2018 0918   PROT 6.9 09/30/2018 0918   ALBUMIN 4.1 06/18/2018 0941   AST 13 09/30/2018 0918   ALT 11 09/30/2018 0918   ALKPHOS 43 06/18/2018 0941   BILITOT 0.3 09/30/2018 0918   GFRNONAA 55 (L) 09/30/2018 0918   GFRAA 63 09/30/2018 0918     Diabetic Labs (most recent): Lab Results  Component Value Date   HGBA1C 8.1 (H) 09/30/2018   HGBA1C 7.9 (A) 06/18/2018   HGBA1C 7.4 (A) 03/26/2018     Lipid Panel ( most recent) Lipid Panel     Component Value Date/Time  CHOL 146 09/30/2018 0918   TRIG 103 09/30/2018 0918   HDL 49 09/30/2018 0918   CHOLHDL 3.0 09/30/2018 0918   VLDL 25.0 12/21/2017 1053   LDLCALC 78 09/30/2018 0918      Lab Results  Component Value Date   TSH 4.80 (H) 09/30/2018   TSH 3.27 12/21/2017   TSH 2.60 06/11/2017   TSH  2.36 05/22/2016   TSH 2.18 06/16/2015   TSH 2.370 12/19/2011   FREET4 0.9 09/30/2018   FREET4 0.92 06/11/2017   FREET4 0.95 05/22/2016   FREET4 0.92 06/16/2015      Assessment & Plan:   1. Uncontrolled type 2 diabetes mellitus with hyperglycemia (HCC)  - Jonathan Hensley has currently uncontrolled symptomatic type 2 DM since  78 years of age. -His most recent labs show A1c of 8.1%, and hypothyroidism.  -His previsit glycemic profile is consistent with uncontrolled glucose levels averaging 200 mg per DL. -his diabetes is complicated by retinopathy and he remains at a high risk for more acute and chronic complications which include CAD, CVA, CKD, retinopathy, and neuropathy. These are all discussed in detail with him.  - I have counseled him on diet management and weight loss, by adopting a carbohydrate restricted/protein rich diet.  - Patient admits there is a room for improvement in his diet and drink choices. -  Suggestion is made for him to avoid simple carbohydrates  from his diet including Cakes, Sweet Desserts / Pastries, Ice Cream, Soda (diet and regular), Sweet Tea, Candies, Chips, Cookies, Store Bought Juices, Alcohol in Excess of  1-2 drinks a day, Artificial Sweeteners, and "Sugar-free" Products. This will help patient to have stable blood glucose profile and potentially avoid unintended weight gain.   - I encouraged him to switch to  unprocessed or minimally processed complex starch and increased protein intake (animal or plant source), fruits, and vegetables.  - he is advised to stick to a routine mealtimes to eat 3 meals  a day and avoid unnecessary snacks ( to snack only to correct hypoglycemia).   - he will be scheduled with Jearld Fenton, RDN, CDE for individualized diabetes education.  - I have approached him with the following individualized plan to manage diabetes and patient agrees:    -In his current glycemic burden, he will need insulin treatment in order for  him to achieve control of diabetes to target.  He will benefit from simplified treatment approach. -He will be considered for basal insulin, discussed and initiated Toujeo 20 units nightly, associated with monitoring of blood glucose twice a day-daily before breakfast and at bedtime.  -He is advised to continue metformin 500 mg ER p.o. twice daily after breakfast and supper.   - he will be considered for incretin therapy as appropriate next visit.  - Patient specific target  A1c;  LDL, HDL, Triglycerides, and  Waist Circumference were discussed in detail.  2) Blood Pressure /Hypertension:  he is advised to home monitor blood pressure and report if > 140/90 on 2 separate readings.  he is advised to continue his current medications including benazepril 40 mg p.o. daily with breakfast .   3) Lipids/Hyperlipidemia:   Review of his recent lipid panel showed  controlled  LDL at 53 .  he  is advised to continue    Zocor 20 mg daily at bedtime, as well as Zetia 10 mg p.o. nightly.   Side effects and precautions discussed with him.  4)  Weight/Diet:    I discussed with him  the fact that loss of 5 - 10% of his  current body weight will have the most impact on his diabetes management.  CDE Consult will be initiated . Exercise, and detailed carbohydrates information provided  -  detailed on discharge instructions.   5) Hypothyroidism: New Diagnosis I discussed and initiated a low dose LT4 48mg po qam for him.  - We discussed about the correct intake of his thyroid hormone, on empty stomach at fasting, with water, separated by at least 30 minutes from breakfast and other medications,  and separated by more than 4 hours from calcium, iron, multivitamins, acid reflux medications (PPIs). -Patient is made aware of the fact that thyroid hormone replacement is needed for life, dose to be adjusted by periodic monitoring of thyroid function tests.  6) Chronic Care/Health Maintenance:  -he  is on ACEI/ARB and  Statin medications and  is encouraged to initiate and continue to follow up with Ophthalmology, Dentist,  Podiatrist at least yearly or according to recommendations, and advised to   stay away from smoking. I have recommended yearly flu vaccine and pneumonia vaccine at least every 5 years; moderate intensity exercise for up to 150 minutes weekly; and  sleep for at least 7 hours a day.  - he is  advised to maintain close follow up with KElayne Snare MD for primary care needs, as well as his other providers for optimal and coordinated care.  - Patient Care Time Today:  25 min, of which >50% was spent in reviewing his  current and  previous labs/studies, previous treatments, and medications doses and developing a plan for long-term care based on the latest recommendations for standards of care.  AFredia Hensley As well as his wife BPamala Hurryparticipated in the discussions, expressed understanding, and voiced agreement with the above plans.  All questions were answered to his satisfaction. he is encouraged to contact clinic should he have any questions or concerns prior to his return visit.   Follow up plan: - Return in about 10 days (around 10/21/2018) for Follow up with [facility] and Logs Only - no Labs.  GGlade Lloyd MD CThe [facility] ValleyGroup R[facility]ines[address] [address]Phone: [phone] Fax: [phone]   10/11/2018, 11:49 AM  This note was partially dictated with voice recognition software. Similar sounding words can be transcribed inadequately or may not  be corrected upon review.

## 2018-10-11 NOTE — Telephone Encounter (Signed)
LeighAnn Keera Altidor, CMA  

## 2018-10-21 ENCOUNTER — Telehealth: Payer: Self-pay

## 2018-10-21 ENCOUNTER — Other Ambulatory Visit: Payer: Self-pay

## 2018-10-21 ENCOUNTER — Ambulatory Visit (INDEPENDENT_AMBULATORY_CARE_PROVIDER_SITE_OTHER): Payer: Medicare Other | Admitting: "Endocrinology

## 2018-10-21 ENCOUNTER — Encounter: Payer: Self-pay | Admitting: "Endocrinology

## 2018-10-21 VITALS — BP 110/69 | HR 64 | Ht 70.0 in | Wt 211.0 lb

## 2018-10-21 DIAGNOSIS — E1165 Type 2 diabetes mellitus with hyperglycemia: Secondary | ICD-10-CM

## 2018-10-21 DIAGNOSIS — I10 Essential (primary) hypertension: Secondary | ICD-10-CM | POA: Diagnosis not present

## 2018-10-21 DIAGNOSIS — E782 Mixed hyperlipidemia: Secondary | ICD-10-CM

## 2018-10-21 DIAGNOSIS — E039 Hypothyroidism, unspecified: Secondary | ICD-10-CM | POA: Diagnosis not present

## 2018-10-21 DIAGNOSIS — B351 Tinea unguium: Secondary | ICD-10-CM | POA: Diagnosis not present

## 2018-10-21 DIAGNOSIS — M79674 Pain in right toe(s): Secondary | ICD-10-CM | POA: Diagnosis not present

## 2018-10-21 DIAGNOSIS — L851 Acquired keratosis [keratoderma] palmaris et plantaris: Secondary | ICD-10-CM | POA: Diagnosis not present

## 2018-10-21 DIAGNOSIS — E1151 Type 2 diabetes mellitus with diabetic peripheral angiopathy without gangrene: Secondary | ICD-10-CM | POA: Diagnosis not present

## 2018-10-21 DIAGNOSIS — M79675 Pain in left toe(s): Secondary | ICD-10-CM | POA: Diagnosis not present

## 2018-10-21 MED ORDER — INSULIN GLARGINE 100 UNIT/ML SOLOSTAR PEN: 30.0000 [IU] | PEN_INJECTOR | Freq: Every day | SUBCUTANEOUS | 1 refills | Status: DC

## 2018-10-21 MED ORDER — SYNTHROID 25 MCG PO TABS: 25.0000 ug | ORAL_TABLET | Freq: Every day | ORAL | 1 refills | Status: DC

## 2018-10-21 NOTE — Patient Instructions (Signed)

## 2018-10-21 NOTE — Telephone Encounter (Signed)
Stop benazepril completely and let me know and continue to monitor the BP

## 2018-10-21 NOTE — Progress Notes (Signed)
10/21/2018, 4:06 PM   Endocrinology follow-up note  Subjective:    Patient ID: Jonathan Hensley, male    DOB: February 13, 1941.  Jonathan Hensley is being seen in f/u for management of currently uncontrolled symptomatic diabetes. Previously seen by [name].  Past Medical History:  Diagnosis Date  . Arthritis    rheumatoid in his hands  . Atrial fibrillation ([place])   . Bulging lumbar disc   . Cancer ([place])   . DDD (degenerative disc disease), cervical   . Diabetes mellitus   . Diabetic retinopathy ([place])   . Double vision   . Dysrhythmia    Atrial Fibrillation  . History of prostate cancer   . Hypertension   . Low back pain   . Nephrolithiasis   . Sleep apnea    uses cpap    Past Surgical History:  Procedure Laterality Date  . APPENDECTOMY    . BACK SURGERY    . Bilateral L4-5 lumbar decompression including bilateral  11/2008  . brachytherapy    . COLONOSCOPY    . CYSTOSCOPY    . Left L4-L5 lumbar laminotomy and microdiskectomy with  07/14/2008    . LITHOTRIPSY    . LUMBAR LAMINECTOMY/DECOMPRESSION MICRODISCECTOMY Left 08/18/2013   Procedure: Left Lumbar Five-Sacral One Microdiskectomy;  Surgeon: [name], MD;  Location: [facility];  Service: Neurosurgery;  Laterality: Left;  Left Lumbar Five-Sacral One Microdiskectomy  . rotator cuff surgery    . Scleral buckle, laser photocoagulation, and anterior chamber  01/04/2009    Social History   Socioeconomic History  . Marital status: Married    Spouse name: Jonathan Hensley  . Number of children: 2  . Years of education: 61  . Highest education level: Not on file  Occupational History  . Not on file  Social Needs  . Financial resource strain: Not on file  . Food insecurity:    Worry: Not on file    Inability: Not on file  . Transportation needs:    Medical: Not on file    Non-medical: Not on file  Tobacco Use  . Smoking status:  Former Smoker    Last attempt to quit: 02/12/1973    Years since quitting: 45.7  . Smokeless tobacco: Never Used  Substance and Sexual Activity  . Alcohol use: No  . Drug use: No  . Sexual activity: Yes  Lifestyle  . Physical activity:    Days per week: Not on file    Minutes per session: Not on file  . Stress: Not on file  Relationships  . Social connections:    Talks on phone: Not on file    Gets together: Not on file    Attends religious service: Not on file    Active member of club or organization: Not on file    Attends meetings of clubs or organizations: Not on file    Relationship status: Not on file  Other Topics Concern  . Not on file  Social History Narrative   Lives at home with wife   Drinks 4 cups of coffee a day  Family History  Problem Relation Age of Onset  . Heart failure Father   . Heart failure Mother   . Heart failure Brother        Half brother.  . Colon cancer Neg Hx     Outpatient Encounter Medications as of 10/21/2018  Medication Sig  . benazepril (LOTENSIN) 40 MG tablet Take 40 mg by mouth at bedtime.   . Blood Glucose Monitoring Suppl (FREESTYLE FREEDOM LITE) w/Device KIT 1 each by Does not apply route 4 (four) times daily. USE BLOOD GLUCOSE MONITOR TO CHECK BLOOD SUGAR.  Marland Kitchen ELIQUIS 5 MG TABS tablet TAKE 1 TABLET TWICE A DAY  . ezetimibe (ZETIA) 10 MG tablet TAKE 1 TABLET EVERY EVENING AFTER DINNER  . FREESTYLE LITE test strip USE TO CHECK BLOOD SUGAR FOUR TIMES A DAY AS INSTRUCTED  . Insulin Glargine (LANTUS SOLOSTAR) 100 UNIT/ML Solostar Pen Inject 30 Units into the skin daily.  . Insulin Pen Needle (B-D ULTRAFINE III SHORT PEN) 31G X 8 MM MISC 1 each by Does not apply route as directed.  . Lancets (FREESTYLE) lancets Use as instructed to check blood sugar 4 times per day dx code 250.00  . metFORMIN (GLUCOPHAGE-XR) 500 MG 24 hr tablet Take 1 tablet (500 mg total) by mouth 2 (two) times daily with a meal.  . NOVOLOG 100 UNIT/ML injection USE UP  TO 56 UNITS IN V-GO PUMP DAILY AS DIRECTED  . simvastatin (ZOCOR) 20 MG tablet TAKE 1 TABLET EVERY EVENING AFTER DINNER  . SYNTHROID 25 MCG tablet Take 1 tablet (25 mcg total) by mouth daily before breakfast.  . [DISCONTINUED] Insulin Glargine, 2 Unit Dial, (TOUJEO MAX SOLOSTAR) 300 UNIT/ML SOPN Inject 20 Units into the skin at bedtime.  . [DISCONTINUED] levothyroxine (SYNTHROID) 25 MCG tablet Take 1 tablet (25 mcg total) by mouth daily before breakfast. (Patient not taking: Reported on 10/21/2018)   No facility-administered encounter medications on file as of 10/21/2018.     ALLERGIES: Allergies  Allergen Reactions  . Other Other (See Comments)    Beta or alpha blockers: severe dizziness  . Tramadol Other (See Comments)    Dizziness     VACCINATION STATUS: Immunization History  Administered Date(s) Administered  . Influenza Split 02/12/2013  . Influenza, High Dose Seasonal PF 03/26/2018  . Influenza,inj,Quad PF,6+ Mos 02/26/2015  . Influenza-Unspecified 02/17/2014  . Pneumococcal Conjugate-13 11/26/2014  . Tdap 07/21/2012    Diabetes  He presents for his follow-up diabetic visit. He has type 2 diabetes mellitus. Onset time: He was diagnosed at approximate age of 38 years. His disease course has been worsening. There are no hypoglycemic associated symptoms. Pertinent negatives for hypoglycemia include no confusion, headaches, pallor or seizures. Pertinent negatives for diabetes include no chest pain, no fatigue, no polydipsia, no polyphagia, no polyuria and no weakness. There are no hypoglycemic complications. Symptoms are improving. Diabetic complications include retinopathy. Risk factors for coronary artery disease include dyslipidemia, family history, male sex, sedentary lifestyle and tobacco exposure. Current diabetic treatment includes insulin injections and oral agent (monotherapy) (He is currently on insulin utilizing V-go, as well as metformin 500 mg ER twice daily.). His weight is  fluctuating minimally. He is following a generally unhealthy diet. When asked about meal planning, he reported none. He has not had a previous visit with a dietitian. He participates in exercise intermittently. His home blood glucose trend is increasing steadily. His breakfast blood glucose range is generally 180-200 mg/dl. His bedtime blood glucose range is generally >200 mg/dl. His  overall blood glucose range is >200 mg/dl. (His recent labs show A1c of 8.1%.) An ACE inhibitor/angiotensin II receptor blocker is being taken. Eye exam is current.  Hyperlipidemia  This is a chronic problem. The current episode started more than 1 year ago. The problem is controlled. Exacerbating diseases include diabetes and obesity. Pertinent negatives include no chest pain, myalgias or shortness of breath. Current antihyperlipidemic treatment includes statins. Risk factors for coronary artery disease include diabetes mellitus, dyslipidemia, hypertension, male sex and a sedentary lifestyle.  Hypertension  This is a chronic problem. The current episode started more than 1 year ago. Pertinent negatives include no chest pain, headaches, neck pain, palpitations or shortness of breath. Risk factors for coronary artery disease include dyslipidemia, diabetes mellitus, male gender, obesity, sedentary lifestyle and smoking/tobacco exposure. Past treatments include ACE inhibitors. Hypertensive end-organ damage includes retinopathy.   Review of systems Limited as above.  Review of Systems  Constitutional: Negative for fatigue.  Respiratory: Negative for shortness of breath.   Cardiovascular: Negative for chest pain and palpitations.  Musculoskeletal: Negative for myalgias and neck pain.  Skin: Negative for pallor.  Neurological: Negative for seizures, weakness and headaches.  Endo/Heme/Allergies: Negative for polydipsia and polyphagia.  Psychiatric/Behavioral: Negative for confusion.     Objective:    BP 110/69   Pulse 64    Ht 5' 10"  (1.778 m)   Wt 211 lb (95.7 kg)   BMI 30.28 kg/m   Wt Readings from Last 3 Encounters:  10/21/18 211 lb (95.7 kg)  09/30/18 216 lb (98 kg)  06/18/18 223 lb 9.6 oz (101.4 kg)    Constitutional:  not in acute distress, normal state of mind Eyes:  EOMI, no exophthalmos Neck: Supple Respiratory: Adequate breathing efforts Musculoskeletal: no gross deformities, strength intact in all four extremities Skin:  no rashes, no hyperemia Neurological: no tremor with outstretched hands.  CMP ( most recent) CMP     Component Value Date/Time   NA 141 09/30/2018 0918   K 5.2 09/30/2018 0918   CL 110 09/30/2018 0918   CO2 27 09/30/2018 0918   GLUCOSE 94 09/30/2018 0918   BUN 39 (H) 09/30/2018 0918   CREATININE 1.26 (H) 09/30/2018 0918   CALCIUM 9.5 09/30/2018 0918   PROT 6.9 09/30/2018 0918   ALBUMIN 4.1 06/18/2018 0941   AST 13 09/30/2018 0918   ALT 11 09/30/2018 0918   ALKPHOS 43 06/18/2018 0941   BILITOT 0.3 09/30/2018 0918   GFRNONAA 55 (L) 09/30/2018 0918   GFRAA 63 09/30/2018 0918     Diabetic Labs (most recent): Lab Results  Component Value Date   HGBA1C 8.1 (H) 09/30/2018   HGBA1C 7.9 (A) 06/18/2018   HGBA1C 7.4 (A) 03/26/2018     Lipid Panel ( most recent) Lipid Panel     Component Value Date/Time   CHOL 146 09/30/2018 0918   TRIG 103 09/30/2018 0918   HDL 49 09/30/2018 0918   CHOLHDL 3.0 09/30/2018 0918   VLDL 25.0 12/21/2017 1053   LDLCALC 78 09/30/2018 0918      Lab Results  Component Value Date   TSH 4.80 (H) 09/30/2018   TSH 3.27 12/21/2017   TSH 2.60 06/11/2017   TSH 2.36 05/22/2016   TSH 2.18 06/16/2015   TSH 2.370 12/19/2011   FREET4 0.9 09/30/2018   FREET4 0.92 06/11/2017   FREET4 0.95 05/22/2016   FREET4 0.92 06/16/2015      Assessment & Plan:   1. Uncontrolled type 2 diabetes mellitus with hyperglycemia ([place])  -  [name] has currently uncontrolled symptomatic type 2 DM since  78 years of age. -His most recent labs  show A1c of 8.1%, and hypothyroidism.  -His blood glucose profile shows improved fasting glycemia, still above target.    -his diabetes is complicated by retinopathy and he remains at a high risk for more acute and chronic complications which include CAD, CVA, CKD, retinopathy, and neuropathy. These are all discussed in detail with him.  - I have counseled him on diet management and weight loss, by adopting a carbohydrate restricted/protein rich diet.  - he  admits there is a room for improvement in his diet and drink choices. -  Suggestion is made for him to avoid simple carbohydrates  from his diet including Cakes, Sweet Desserts / Pastries, Ice Cream, Soda (diet and regular), Sweet Tea, Candies, Chips, Cookies, Sweet Pastries,  Store Bought Juices, Alcohol in Excess of  1-2 drinks a day, Artificial Sweeteners, Coffee Creamer, and "Sugar-free" Products. This will help patient to have stable blood glucose profile and potentially avoid unintended weight gain.  - I encouraged him to switch to  unprocessed or minimally processed complex starch and increased protein intake (animal or plant source), fruits, and vegetables.  - he is advised to stick to a routine mealtimes to eat 3 meals  a day and avoid unnecessary snacks ( to snack only to correct hypoglycemia).   - he will be scheduled with Jearld Fenton, RDN, CDE for individualized diabetes education.  - I have approached him with the following individualized plan to manage diabetes and patient agrees:    -Given  his current glycemic burden, he will continue to need insulin treatment in order for him to achieve control of diabetes to target.  -He is already benefited from simplified treatment approach.  -His insurance will cover Lantus instead of Toujeo.  I discussed and increased his Lantus to 30 units nightly, associated with monitoring of blood glucose twice a day-daily before breakfast and at bedtime.  -He is advised to continue metformin  500 mg ER p.o. twice daily after breakfast and supper.   - he will be considered for incretin therapy as appropriate next visit.  - Patient specific target  A1c;  LDL, HDL, Triglycerides, and  Waist Circumference were discussed in detail.  2) Blood Pressure /Hypertension: His blood pressure is controlled to target. he is advised to home monitor blood pressure and report if > 140/90 on 2 separate readings.  he is advised to continue his current medications including benazepril 40 mg p.o. daily with breakfast .   3) Lipids/Hyperlipidemia:   Review of his recent lipid panel showed  controlled  LDL at 53 .  he  is advised to continue    Zocor 20 mg daily at bedtime, as well as Zetia 10 mg p.o. nightly.   Side effects and precautions discussed with him.  4)  Weight/Diet:    I discussed with him the fact that loss of 5 - 10% of his  current body weight will have the most impact on his diabetes management.  CDE Consult will be initiated . Exercise, and detailed carbohydrates information provided  -  detailed on discharge instructions.   5) Hypothyroidism: New Diagnosis -He did not tolerate levothyroxine.  I discussed and switched his thyroid hormone to Synthroid 25 mcg p.o. daily before breakfast.  - We discussed about the correct intake of his thyroid hormone, on empty stomach at fasting, with water, separated by at least 30 minutes from breakfast  and other medications,  and separated by more than 4 hours from calcium, iron, multivitamins, acid reflux medications (PPIs). -Patient is made aware of the fact that thyroid hormone replacement is needed for life, dose to be adjusted by periodic monitoring of thyroid function tests.  6) Chronic Care/Health Maintenance:  -he  is on ACEI/ARB and Statin medications and  is encouraged to initiate and continue to follow up with Ophthalmology, Dentist,  Podiatrist at least yearly or according to recommendations, and advised to   stay away from smoking. I have  recommended yearly flu vaccine and pneumonia vaccine at least every 5 years; moderate intensity exercise for up to 150 minutes weekly; and  sleep for at least 7 hours a day.  - he is  advised to maintain close follow up with [name], MD for primary care needs, as well as his other providers for optimal and coordinated care.  - Time spent with the patient: 25 min, of which >50% was spent in reviewing his blood glucose logs , discussing his hypoglycemia and hyperglycemia episodes, reviewing his current and  previous labs / studies and medications  doses and developing a plan to avoid hypoglycemia and hyperglycemia. Please refer to Patient Instructions for Blood Glucose Monitoring and Insulin/Medications Dosing Guide"  in media tab for additional information. Please  also refer to " Patient Self Inventory" in the Media  tab for reviewed elements of pertinent patient history.  [name] participated in the discussions, expressed understanding, and voiced agreement with the above plans.  All questions were answered to his satisfaction. he is encouraged to contact clinic should he have any questions or concerns prior to his return visit.    Follow up plan: - Return in about 3 months (around 01/21/2019) for Meter, and Logs.  Glade Lloyd, MD [facility] [address] [place], [place] [address] Phone: [phone]  Fax: [phone]    10/21/2018, 4:06 PM  This note was partially dictated with voice recognition software. Similar sounding words can be transcribed inadequately or may not  be corrected upon review.

## 2018-10-21 NOTE — Telephone Encounter (Signed)
Pt's wife called and stated he is losing weight 62 pounds in 8 monts, and now has Orthostatic Hypotension and gets dizzy when leaning over. bp is 116/63, sometimes diastolic pressure   gets in to the low 50's, can they try half the dose

## 2018-10-23 ENCOUNTER — Other Ambulatory Visit: Payer: Self-pay

## 2018-10-24 ENCOUNTER — Ambulatory Visit (INDEPENDENT_AMBULATORY_CARE_PROVIDER_SITE_OTHER): Payer: Medicare Other | Admitting: Otolaryngology

## 2018-10-31 ENCOUNTER — Encounter: Payer: Self-pay | Admitting: Nutrition

## 2018-10-31 NOTE — Patient Instructions (Signed)
Goals Follow My Plate Eat three meals per day at times discussed. Eat 3-4 carb choices per meal. Take insulin as  Dr. Liliane Channel recommendations. You need to increase your water intake to prevent dehydration. 6 bottles per day. Test BS per Dr. Liliane Channel recommendations.

## 2018-11-21 DIAGNOSIS — H43813 Vitreous degeneration, bilateral: Secondary | ICD-10-CM | POA: Diagnosis not present

## 2018-11-21 DIAGNOSIS — E113293 Type 2 diabetes mellitus with mild nonproliferative diabetic retinopathy without macular edema, bilateral: Secondary | ICD-10-CM | POA: Diagnosis not present

## 2018-11-21 DIAGNOSIS — H35371 Puckering of macula, right eye: Secondary | ICD-10-CM | POA: Diagnosis not present

## 2018-11-21 DIAGNOSIS — H31093 Other chorioretinal scars, bilateral: Secondary | ICD-10-CM | POA: Diagnosis not present

## 2018-12-11 ENCOUNTER — Encounter: Payer: Self-pay | Admitting: Nutrition

## 2018-12-11 ENCOUNTER — Other Ambulatory Visit: Payer: Self-pay

## 2018-12-11 ENCOUNTER — Encounter: Payer: Medicare Other | Attending: "Endocrinology | Admitting: Nutrition

## 2018-12-11 DIAGNOSIS — E118 Type 2 diabetes mellitus with unspecified complications: Secondary | ICD-10-CM | POA: Insufficient documentation

## 2018-12-11 DIAGNOSIS — E1165 Type 2 diabetes mellitus with hyperglycemia: Secondary | ICD-10-CM | POA: Insufficient documentation

## 2018-12-11 DIAGNOSIS — IMO0002 Reserved for concepts with insufficient information to code with codable children: Secondary | ICD-10-CM

## 2018-12-11 NOTE — Patient Instructions (Signed)
Goals Keep up the great job! Stay active Test BS in am Goal is FBS less than 130 mg/dl. Let Dr. Dorris Fetch know if BS are less than 80 mg/dl 2-3 times in a row. Get A1C to 7% or less.

## 2018-12-11 NOTE — Progress Notes (Signed)
  Medical Nutrition Therapy:  Appt start time: 1000 end time:  1030.   Assessment:  Primary concerns today: PHone Visit.  Testing blood in am.. Sees Dr. Dorris Fetch, Endocrinology. Had seen [name] previously. Has been following the plate method, eating more fresh fruits and vegetables. BB) 94-120's.  Lantus 30 units a day.  Drinking more water. Stays busy working outside. He has retinopathy and Hypothyroidism. Doing very well. No low blood sugars. Scheduled to see Dr. Dorris Fetch, September 2020.  Lab Results  Component Value Date   HGBA1C 8.1 (H) 09/30/2018   CMP Latest Ref Rng & Units 09/30/2018 06/18/2018 03/26/2018  Glucose 65 - 99 mg/dL 94 107(H) 106(H)  BUN 7 - 25 mg/dL 39(H) 27(H) 33(H)  Creatinine 0.70 - 1.18 mg/dL 1.26(H) 1.13 1.27  Sodium 135 - 146 mmol/L 141 139 140  Potassium 3.5 - 5.3 mmol/L 5.2 4.4 4.3  Chloride 98 - 110 mmol/L 110 105 105  CO2 20 - 32 mmol/L 27 27 27   Calcium 8.6 - 10.3 mg/dL 9.5 9.8 9.7  Total Protein 6.1 - 8.1 g/dL 6.9 6.8 7.5  Total Bilirubin 0.2 - 1.2 mg/dL 0.3 0.4 0.3  Alkaline Phos 39 - 117 U/L - 43 44  AST 10 - 35 U/L 13 12 16   ALT 9 - 46 U/L 11 11 12      Preferred Learning Style:   No preference indicated   Learning Readiness:   Ready  Change in progress   MEDICATIONS:   DIETARY INTAKE:  24-hr recall:  B ( AM):FBS:  Eggs, toast, fruit, coffee and hot tea, tea, Snk ( AM):  L ( PM): Meat and vegetables, water Snk ( PM): D ( PM):Meat, vegetables, fruit, water, ,unsweet tea Snk ( PM):  Beverages: water, unsweet tea  Usual physical activity: None  Estimated energy needs: 1800  calories 200 g carbohydrates 135 g protein 50 g fat  Progress Towards Goal(s):  In progress.   Nutritional Diagnosis:  NB-1.1 Food and nutrition-related knowledge deficit As related to Diabetes .  As evidenced by A1C .    Intervention:  Nutrition and Diabetes education provided on My Plate, CHO counting, meal planning, portion sizes, timing of meals,  avoiding snacks between meals unless having a low blood sugar, target ranges for A1C and blood sugars, signs/symptoms and treatment of hyper/hypoglycemia, monitoring blood sugars, taking medications as prescribed, benefits of exercising 30 minutes per day and prevention of complications of DM. Goals Keep up the great job! Stay active Test BS in am Goal is FBS less than 130 mg/dl. Let Dr. Dorris Fetch know if BS are less than 80 mg/dl 2-3 times in a row. Get A1C to 7% or less.   Teaching Method Utilized: Visual Auditory Hands on  Handouts given during visit include:  Verbally went over The Plate Method    Barriers to learning/adherence to lifestyle change: none  Demonstrated degree of understanding via:  Teach Back   Monitoring/Evaluation:  Dietary intake, exercise, , and body weight  3 months.Marland Kitchen

## 2018-12-25 ENCOUNTER — Other Ambulatory Visit: Payer: Self-pay

## 2018-12-25 ENCOUNTER — Ambulatory Visit (INDEPENDENT_AMBULATORY_CARE_PROVIDER_SITE_OTHER): Payer: Medicare Other | Admitting: Cardiology

## 2018-12-25 ENCOUNTER — Encounter: Payer: Self-pay | Admitting: Cardiology

## 2018-12-25 VITALS — BP 130/80 | HR 90 | Ht 70.0 in | Wt 204.0 lb

## 2018-12-25 DIAGNOSIS — Z9989 Dependence on other enabling machines and devices: Secondary | ICD-10-CM | POA: Diagnosis not present

## 2018-12-25 DIAGNOSIS — I480 Paroxysmal atrial fibrillation: Secondary | ICD-10-CM | POA: Diagnosis not present

## 2018-12-25 DIAGNOSIS — IMO0002 Reserved for concepts with insufficient information to code with codable children: Secondary | ICD-10-CM

## 2018-12-25 DIAGNOSIS — E1165 Type 2 diabetes mellitus with hyperglycemia: Secondary | ICD-10-CM

## 2018-12-25 DIAGNOSIS — E113552 Type 2 diabetes mellitus with stable proliferative diabetic retinopathy, left eye: Secondary | ICD-10-CM

## 2018-12-25 DIAGNOSIS — G4733 Obstructive sleep apnea (adult) (pediatric): Secondary | ICD-10-CM

## 2018-12-25 DIAGNOSIS — Z794 Long term (current) use of insulin: Secondary | ICD-10-CM | POA: Diagnosis not present

## 2018-12-25 DIAGNOSIS — I10 Essential (primary) hypertension: Secondary | ICD-10-CM | POA: Diagnosis not present

## 2018-12-25 NOTE — Progress Notes (Signed)
Primary Physician:  Cassandria Anger, MD   Patient ID: Jonathan Hensley, male    DOB: 1940/08/21, 78 y.o.   MRN: [id number]  Subjective:    Chief Complaint  Patient presents with  . Atrial Fibrillation  . Follow-up    34yr   HPI: Jonathan Hensley is a 78y.o. male  with history of diabetes mellitus uncontrolled, hypertension and hyperlipidemia presents here for follow-up of paroxysmal atrial fibrillation. last Episode of A. Fib was on 06/14/2016 and converted to sinus on Diltazem.  He has double vision and prism colors due to h/o retinal detachment (chronic) but has also developed diabetic retinopathy left eye, chronic vertigo.He denies any chest pain, shortness of breath. He has sleep apnea and uses CPAP on a regular basis. Has back issues that are stable with using a back brace, he has had 4 surgeries in past. He states he is otherwise going well. No palpitations, fatigue or dyspnea.   Past Medical History:  Diagnosis Date  . Arthritis    rheumatoid in his hands  . Atrial fibrillation ([place]   . Bulging lumbar disc   . Cancer ([place]   . DDD (degenerative disc disease), cervical   . Diabetes mellitus   . Diabetic retinopathy ([place]   . Double vision   . Dysrhythmia    Atrial Fibrillation  . History of prostate cancer   . Hypertension   . Low back pain   . Nephrolithiasis   . Sleep apnea    uses cpap    Past Surgical History:  Procedure Laterality Date  . APPENDECTOMY    . BACK SURGERY    . Bilateral L4-5 lumbar decompression including bilateral  11/2008  . brachytherapy    . COLONOSCOPY    . CYSTOSCOPY    . Left L4-L5 lumbar laminotomy and microdiskectomy with  07/14/2008    . LITHOTRIPSY    . LUMBAR LAMINECTOMY/DECOMPRESSION MICRODISCECTOMY Left 08/18/2013   Procedure: Left Lumbar Five-Sacral One Microdiskectomy;  Surgeon: RHosie Spangle MD;  [facility];  Service: Neurosurgery;  Laterality: Left;  Left Lumbar Five-Sacral One Microdiskectomy  .  rotator cuff surgery    . Scleral buckle, laser photocoagulation, and anterior chamber  01/04/2009    Social History   Socioeconomic History  . Marital status: Married    Spouse name: BPamala Hurry . Number of children: 2  . Years of education: 137 . Highest education level: Not on file  Occupational History  . Not on file  Social Needs  . Financial resource strain: Not on file  . Food insecurity    Worry: Not on file    Inability: Not on file  . Transportation needs    Medical: Not on file    Non-medical: Not on file  Tobacco Use  . Smoking status: Former Smoker    Quit date: 02/12/1973    Years since quitting: 45.8  . Smokeless tobacco: Never Used  . Tobacco comment: starting at age 78 Substance and Sexual Activity  . Alcohol use: No  . Drug use: No  . Sexual activity: Yes  Lifestyle  . Physical activity    Days per week: Not on file    Minutes per session: Not on file  . Stress: Not on file  Relationships  . Social [place]bal[place] phone: Not on file    Gets together: Not on file    Attends religious service: Not on file  Active member of club or organization: Not on file    Attends meetings of clubs or organizations: Not on file    Relationship status: Not on file  . Intimate partner violence    Fear of current or ex partner: Not on file    Emotionally abused: Not on file    Physically abused: Not on file    Forced sexual activity: Not on file  Other Topics Concern  . Not on file  Social History Narrative   Lives at home with wife   Drinks 4 cups of coffee a day    Review of Systems  Constitution: Negative for decreased appetite, malaise/fatigue, weight gain and weight loss.  Eyes: Positive for double vision (history of retinal detachment s/p repair, left diabetic retinopathy). Negative for visual disturbance.  Cardiovascular: Negative for chest pain, claudication, dyspnea on exertion, leg swelling, orthopnea, palpitations and syncope.   Respiratory: Negative for hemoptysis and wheezing.   Endocrine: Negative for cold intolerance and heat intolerance.  Hematologic/Lymphatic: Does not bruise/bleed easily.  Skin: Negative for nail changes.  Musculoskeletal: Positive for back pain. Negative for muscle weakness and myalgias.  Gastrointestinal: Negative for abdominal pain, change in bowel habit, nausea and vomiting.  Neurological: Positive for dizziness (chronic). Negative for difficulty with concentration, focal weakness and headaches.  Psychiatric/Behavioral: Negative for altered mental status and suicidal ideas.  All other systems reviewed and are negative.     Objective:  Blood pressure (!) 169/86, pulse (!) 104, height 5' 10"  (1.778 m), weight 204 lb (92.5 kg), SpO2 96 %. Body mass index is 29.27 kg/m.    Physical Exam  Constitutional: He appears well-developed and well-nourished. No distress.  HENT:  Head: Atraumatic.  Eyes: Conjunctivae are normal.  Neck: Neck supple. No JVD present. No thyromegaly present.  Cardiovascular: Intact distal pulses and normal pulses. An irregularly irregular rhythm present. Exam reveals no gallop, no S3 and no S4.  No murmur heard. S1 is variable, S2 is normal.   Pulmonary/Chest: Effort normal and breath sounds normal.  Abdominal: Soft. Bowel sounds are normal.  Musculoskeletal: Normal range of motion.        General: No edema.  Neurological: He is alert.  Skin: Skin is warm and dry.  Psychiatric: He has a normal mood and affect.   Radiology: No results found.  Laboratory examination:    Labs 11/20/2016: CMP normal, creatinine 1.19, potassium 4.6. EGFR greater than 60 mL. HB 12.3/HCT 37.0, platelets 256. Mild microcytic index. A1c 7.4%.  CMP Latest Ref Rng & Units 09/30/2018 06/18/2018 03/26/2018  Glucose 65 - 99 mg/dL 94 107(H) 106(H)  BUN 7 - 25 mg/dL 39(H) 27(H) 33(H)  Creatinine 0.70 - 1.18 mg/dL 1.26(H) 1.13 1.27  Sodium 135 - 146 mmol/L 141 139 140  Potassium 3.5 - 5.3  mmol/L 5.2 4.4 4.3  Chloride 98 - 110 mmol/L 110 105 105  CO2 20 - 32 mmol/L 27 27 27   Calcium 8.6 - 10.3 mg/dL 9.5 9.8 9.7  Total Protein 6.1 - 8.1 g/dL 6.9 6.8 7.5  Total Bilirubin 0.2 - 1.2 mg/dL 0.3 0.4 0.3  Alkaline Phos 39 - 117 U/L - 43 44  AST 10 - 35 U/L 13 12 16   ALT 9 - 46 U/L 11 11 12    CBC Latest Ref Rng & Units 06/18/2018 03/26/2018 12/21/2017  WBC 4.0 - 10.5 K/uL 6.4 7.5 7.7  Hemoglobin 13.0 - 17.0 g/dL 12.0(L) 12.2(L) 11.6(L)  Hematocrit 39.0 - 52.0 % 36.2(L) 36.9(L) 34.2(L)  Platelets 150.0 -  400.0 K/uL 219.0 249.0 236.0   Lipid Panel     Component Value Date/Time   CHOL 146 09/30/2018 0918   TRIG 103 09/30/2018 0918   HDL 49 09/30/2018 0918   CHOLHDL 3.0 09/30/2018 0918   VLDL 25.0 12/21/2017 1053   LDLCALC 78 09/30/2018 0918   HEMOGLOBIN A1C Lab Results  Component Value Date   HGBA1C 8.1 (H) 09/30/2018   MPG 186 09/30/2018   TSH Recent Labs    09/30/18 0918  TSH 4.80*    PRN Meds:. Medications Discontinued During This Encounter  Medication Reason  . Insulin Glargine (LANTUS SOLOSTAR) 100 UNIT/ML Solostar Pen Change in therapy  . NOVOLOG 100 UNIT/ML injection Change in therapy   Current Meds  Medication Sig  . ELIQUIS 5 MG TABS tablet TAKE 1 TABLET TWICE A DAY  . ezetimibe (ZETIA) 10 MG tablet TAKE 1 TABLET EVERY EVENING AFTER DINNER  . Insulin Glargine, 2 Unit Dial, (TOUJEO MAX SOLOSTAR) 300 UNIT/ML SOPN Inject 30 Units into the skin at bedtime.  . metFORMIN (GLUCOPHAGE-XR) 500 MG 24 hr tablet Take 1 tablet (500 mg total) by mouth 2 (two) times daily with a meal.  . simvastatin (ZOCOR) 20 MG tablet TAKE 1 TABLET EVERY EVENING AFTER DINNER  . SYNTHROID 25 MCG tablet Take 1 tablet (25 mcg total) by mouth daily before breakfast.    Cardiac Studies:   Lexiscan stress 02/23/12: Normal perfusion. No ischemia. Patient in [facility] echo 12/20/11: Normal LVEF. Mild MV calcification. Normal LA size.  Holter Monitor 12/26/11: Brief A. fibrillation with  RVR . NO symptoms reported. Paroxysmal epidoses. ED visit 06/14/2016: A. Fib with RVR.  Assessment:     ICD-10-CM   1. Paroxysmal atrial fibrillation (HCC)  I48.0 EKG 12-Lead  2. Essential hypertension  I10   3. Obstructive sleep apnea on CPAP  G47.33    Z99.89   4. Uncontrolled type 2 diabetes mellitus with stable proliferative retinopathy of left eye, with long-term current use of insulin ([place])  Z02.5852    E11.65    Z79.4    EKG 12/25/2018: Atypical atrial flutter with variable AV conduction, ventricular rate 98 bpm, normal axis.  No evidence of ischemia.  Normal QT interval.  Recommendations:   Patient was [place] here for annual visit and follow-up of paroxysmal atrial fibrillation, today he is in atypical atrial flutter with variable AV conduction. BP is controlled.  He is presently on anticoagulation and is tolerating this well and is being managed by his PCP. He is asymptomatic with regards to A. Flutter and hence no indication for cardioversion.   I reviewed his labs, lipids are under excellent control, blood pressure is also very well controlled.  His diabetes continues to be uncontrolled but is improving now.  He remains asymptomatic from cardiac standpoint.  I'll see him back on an annual basis.  Adrian Prows, MD, [facility] 12/25/2018, 11:12 AM [facility] [place] Pager: [phone] Office: [phone] If no answer Cell [phone]

## 2018-12-30 DIAGNOSIS — L851 Acquired keratosis [keratoderma] palmaris et plantaris: Secondary | ICD-10-CM | POA: Diagnosis not present

## 2018-12-30 DIAGNOSIS — B351 Tinea unguium: Secondary | ICD-10-CM | POA: Diagnosis not present

## 2018-12-30 DIAGNOSIS — E1151 Type 2 diabetes mellitus with diabetic peripheral angiopathy without gangrene: Secondary | ICD-10-CM | POA: Diagnosis not present

## 2018-12-30 DIAGNOSIS — M79674 Pain in right toe(s): Secondary | ICD-10-CM | POA: Diagnosis not present

## 2018-12-30 DIAGNOSIS — M79675 Pain in left toe(s): Secondary | ICD-10-CM | POA: Diagnosis not present

## 2019-01-14 DIAGNOSIS — E039 Hypothyroidism, unspecified: Secondary | ICD-10-CM | POA: Diagnosis not present

## 2019-01-14 DIAGNOSIS — E1165 Type 2 diabetes mellitus with hyperglycemia: Secondary | ICD-10-CM | POA: Diagnosis not present

## 2019-01-15 LAB — COMPLETE METABOLIC PANEL WITH GFR
AG Ratio: 1.5 (calc) (ref 1.0–2.5)
ALT: 10 U/L (ref 9–46)
AST: 12 U/L (ref 10–35)
Albumin: 4 g/dL (ref 3.6–5.1)
Alkaline phosphatase (APISO): 47 U/L (ref 35–144)
BUN/Creatinine Ratio: 30 (calc) — ABNORMAL HIGH (ref 6–22)
BUN: 39 mg/dL — ABNORMAL HIGH (ref 7–25)
CO2: 27 mmol/L (ref 20–32)
Calcium: 9.5 mg/dL (ref 8.6–10.3)
Chloride: 104 mmol/L (ref 98–110)
Creat: 1.32 mg/dL — ABNORMAL HIGH (ref 0.70–1.18)
GFR, Est African American: 59 mL/min/{1.73_m2} — ABNORMAL LOW (ref >=60)
GFR, Est Non African American: 51 mL/min/{1.73_m2} — ABNORMAL LOW (ref >=60)
Globulin: 2.6 g/dL (calc) (ref 1.9–3.7)
Glucose, Bld: 206 mg/dL — ABNORMAL HIGH (ref 65–139)
Potassium: 4.9 mmol/L (ref 3.5–5.3)
Sodium: 138 mmol/L (ref 135–146)
Total Bilirubin: 0.3 mg/dL (ref 0.2–1.2)
Total Protein: 6.6 g/dL (ref 6.1–8.1)

## 2019-01-15 LAB — HEMOGLOBIN A1C
Hgb A1c MFr Bld: 8.8 % of total Hgb — ABNORMAL HIGH (ref <5.7)
Mean Plasma Glucose: 206 (calc)
eAG (mmol/L): 11.4 (calc)

## 2019-01-15 LAB — T4, FREE: Free T4: 1.2 ng/dL (ref 0.8–1.8)

## 2019-01-15 LAB — TSH: TSH: 3.05 mIU/L (ref 0.40–4.50)

## 2019-01-21 ENCOUNTER — Encounter: Payer: Self-pay | Admitting: "Endocrinology

## 2019-01-21 ENCOUNTER — Other Ambulatory Visit: Payer: Self-pay

## 2019-01-21 ENCOUNTER — Ambulatory Visit: Payer: Medicare Other | Admitting: "Endocrinology

## 2019-01-21 ENCOUNTER — Ambulatory Visit (INDEPENDENT_AMBULATORY_CARE_PROVIDER_SITE_OTHER): Payer: Medicare Other | Admitting: "Endocrinology

## 2019-01-21 DIAGNOSIS — E039 Hypothyroidism, unspecified: Secondary | ICD-10-CM

## 2019-01-21 DIAGNOSIS — E1165 Type 2 diabetes mellitus with hyperglycemia: Secondary | ICD-10-CM

## 2019-01-21 DIAGNOSIS — I10 Essential (primary) hypertension: Secondary | ICD-10-CM | POA: Diagnosis not present

## 2019-01-21 DIAGNOSIS — E782 Mixed hyperlipidemia: Secondary | ICD-10-CM | POA: Diagnosis not present

## 2019-01-21 NOTE — Progress Notes (Signed)
01/21/2019, 4:04 PM                                                      Endocrinology Telehealth Visit Follow up Note -During COVID -19 Pandemic  This visit type was conducted due to national recommendations for restrictions regarding the COVID-19 Pandemic  in an effort to limit this patient's exposure and mitigate transmission of the corona virus.  Due to his co-morbid illnesses, Jonathan Hensley is at  moderate to high risk for complications without adequate follow up.  This format is felt to be most appropriate for him at this time.  I connected with this patient on 01/21/2019   by telephone and verified that I am speaking with the correct person using two identifiers. Jonathan Hensley, 08/29/1940. he has verbally consented to this visit. All issues noted in this document were discussed and addressed. The format was not optimal for physical exam.    Subjective:    Patient ID: Jonathan Hensley, male    DOB: 03-29-1941.  Jonathan Hensley is being engaged in telehealth via telephone in f/u for management of currently uncontrolled symptomatic diabetes. Previously seen by [name].  Past Medical History:  Diagnosis Date  . Arthritis    rheumatoid in his hands  . Atrial fibrillation ([place])   . Bulging lumbar disc   . Cancer ([place])   . DDD (degenerative disc disease), cervical   . Diabetes mellitus   . Diabetic retinopathy ([place])   . Double vision   . Dysrhythmia    Atrial Fibrillation  . History of prostate cancer   . Hypertension   . Low back pain   . Nephrolithiasis   . Sleep apnea    uses cpap    Past Surgical History:  Procedure Laterality Date  . APPENDECTOMY    . BACK SURGERY    . Bilateral L4-5 lumbar decompression including bilateral  11/2008  . brachytherapy    . COLONOSCOPY    . CYSTOSCOPY    . Left L4-L5 lumbar laminotomy and microdiskectomy with  07/14/2008    . LITHOTRIPSY    . LUMBAR  LAMINECTOMY/DECOMPRESSION MICRODISCECTOMY Left 08/18/2013   Procedure: Left Lumbar Five-Sacral One Microdiskectomy;  Surgeon: [name], MD;  Location: [place] [facility];  Service: Neurosurgery;  Laterality: Left;  Left Lumbar Five-Sacral One Microdiskectomy  . rotator cuff surgery    . Scleral buckle, laser photocoagulation, and anterior chamber  01/04/2009    Social History   Socioeconomic History  . Marital status: Married    Spouse name: [name]  . Number of children: 2  . Years of education: 54  . Highest education level: Not on file  Occupational History  . Not on file  Social Needs  . Financial resource strain: Not on file  . Food insecurity    Worry: Not on file    Inability: Not on file  . Transportation needs    Medical:  Not on file    Non-medical: Not on file  Tobacco Use  . Smoking status: Former Smoker    Quit date: 02/12/1973    Years since quitting: 45.9  . Smokeless tobacco: Never Used  . Tobacco comment: starting at age 75  Substance and Sexual Activity  . Alcohol use: No  . Drug use: No  . Sexual activity: Yes  Lifestyle  . Physical activity    Days per week: Not on file    Minutes per session: Not on file  . Stress: Not on file  Relationships  . Social Herbalist on phone: Not on file    Gets together: Not on file    Attends religious service: Not on file    Active member of club or organization: Not on file    Attends meetings of clubs or organizations: Not on file    Relationship status: Not on file  Other Topics Concern  . Not on file  Social History Narrative   Lives at home with wife   Drinks 4 cups of coffee a day    Family History  Problem Relation Age of Onset  . Heart failure Father   . Heart failure Mother   . Heart failure Brother        Half brother.  . Colon cancer Neg Hx     Outpatient Encounter Medications as of 01/21/2019  Medication Sig  . Blood Glucose Monitoring Suppl (FREESTYLE FREEDOM LITE) w/Device KIT 1  each by Does not apply route 4 (four) times daily. USE BLOOD GLUCOSE MONITOR TO CHECK BLOOD SUGAR.  Marland Kitchen ELIQUIS 5 MG TABS tablet TAKE 1 TABLET TWICE A DAY  . ezetimibe (ZETIA) 10 MG tablet TAKE 1 TABLET EVERY EVENING AFTER DINNER  . FREESTYLE LITE test strip USE TO CHECK BLOOD SUGAR FOUR TIMES A DAY AS INSTRUCTED  . Insulin Glargine, 2 Unit Dial, (TOUJEO MAX SOLOSTAR) 300 UNIT/ML SOPN Inject 34 Units into the skin at bedtime.  . Insulin Pen Needle (B-D ULTRAFINE III SHORT PEN) 31G X 8 MM MISC 1 each by Does not apply route as directed.  . Lancets (FREESTYLE) lancets Use as instructed to check blood sugar 4 times per day dx code 250.00  . metFORMIN (GLUCOPHAGE-XR) 500 MG 24 hr tablet Take 1 tablet (500 mg total) by mouth 2 (two) times daily with a meal.  . simvastatin (ZOCOR) 20 MG tablet TAKE 1 TABLET EVERY EVENING AFTER DINNER  . SYNTHROID 25 MCG tablet Take 1 tablet (25 mcg total) by mouth daily before breakfast.   No facility-administered encounter medications on file as of 01/21/2019.     ALLERGIES: Allergies  Allergen Reactions  . Other Other (See Comments)    Beta or alpha blockers: severe dizziness  . Tramadol Other (See Comments)    Dizziness     VACCINATION STATUS: Immunization History  Administered Date(s) Administered  . Influenza Split 02/12/2013  . Influenza, High Dose Seasonal PF 03/26/2018  . Influenza,inj,Quad PF,6+ Mos 02/26/2015  . Influenza-Unspecified 02/17/2014  . Pneumococcal Conjugate-13 11/26/2014  . Tdap 07/21/2012    Diabetes He presents for his follow-up diabetic visit. He has type 2 diabetes mellitus. Onset time: He was diagnosed at approximate age of 65 years. His disease course has been fluctuating. There are no hypoglycemic associated symptoms. Pertinent negatives for hypoglycemia include no confusion, headaches, pallor or seizures. Pertinent negatives for diabetes include no chest pain, no fatigue, no polydipsia, no polyphagia, no polyuria and no  weakness. There  are no hypoglycemic complications. Symptoms are worsening. Diabetic complications include retinopathy. Risk factors for coronary artery disease include dyslipidemia, family history, male sex, sedentary lifestyle and tobacco exposure. Current diabetic treatment includes insulin injections and oral agent (monotherapy) (He is currently on insulin utilizing V-go, as well as metformin 500 mg ER twice daily.). His weight is fluctuating minimally. He is following a generally unhealthy diet. When asked about meal planning, he reported none. He has not had a previous visit with a dietitian. He participates in exercise intermittently. His home blood glucose trend is increasing steadily. His breakfast blood glucose range is generally 180-200 mg/dl. His bedtime blood glucose range is generally >200 mg/dl. His overall blood glucose range is >200 mg/dl. (His recent labs show A1c of 8.1%.) An ACE inhibitor/angiotensin II receptor blocker is being taken. Eye exam is current.  Hyperlipidemia This is a chronic problem. The current episode started more than 1 year ago. The problem is controlled. Exacerbating diseases include diabetes and obesity. Pertinent negatives include no chest pain, myalgias or shortness of breath. Current antihyperlipidemic treatment includes statins. Risk factors for coronary artery disease include diabetes mellitus, dyslipidemia, hypertension, male sex and a sedentary lifestyle.  Hypertension This is a chronic problem. The current episode started more than 1 year ago. Pertinent negatives include no chest pain, headaches, neck pain, palpitations or shortness of breath. Risk factors for coronary artery disease include dyslipidemia, diabetes mellitus, male gender, obesity, sedentary lifestyle and smoking/tobacco exposure. Past treatments include ACE inhibitors. Hypertensive end-organ damage includes retinopathy.   Review of systems Limited as above.    Objective:    There were no  vitals taken for this visit.  Wt Readings from Last 3 Encounters:  12/25/18 204 lb (92.5 kg)  10/21/18 211 lb (95.7 kg)  09/30/18 216 lb (98 kg)   CMP ( most recent) CMP     Component Value Date/Time   NA 138 01/14/2019 0813   K 4.9 01/14/2019 0813   CL 104 01/14/2019 0813   CO2 27 01/14/2019 0813   GLUCOSE 206 (H) 01/14/2019 0813   BUN 39 (H) 01/14/2019 0813   CREATININE 1.32 (H) 01/14/2019 0813   CALCIUM 9.5 01/14/2019 0813   PROT 6.6 01/14/2019 0813   ALBUMIN 4.1 06/18/2018 0941   AST 12 01/14/2019 0813   ALT 10 01/14/2019 0813   ALKPHOS 43 06/18/2018 0941   BILITOT 0.3 01/14/2019 0813   GFRNONAA 51 (L) 01/14/2019 0813   GFRAA 59 (L) 01/14/2019 0813     Diabetic Labs (most recent): Lab Results  Component Value Date   HGBA1C 8.8 (H) 01/14/2019   HGBA1C 8.1 (H) 09/30/2018   HGBA1C 7.9 (A) 06/18/2018     Lipid Panel ( most recent) Lipid Panel     Component Value Date/Time   CHOL 146 09/30/2018 0918   TRIG 103 09/30/2018 0918   HDL 49 09/30/2018 0918   CHOLHDL 3.0 09/30/2018 0918   VLDL 25.0 12/21/2017 1053   LDLCALC 78 09/30/2018 0918      Lab Results  Component Value Date   TSH 3.05 01/14/2019   TSH 4.80 (H) 09/30/2018   TSH 3.27 12/21/2017   TSH 2.60 06/11/2017   TSH 2.36 05/22/2016   TSH 2.18 06/16/2015   TSH 2.370 12/19/2011   FREET4 1.2 01/14/2019   FREET4 0.9 09/30/2018   FREET4 0.92 06/11/2017   FREET4 0.95 05/22/2016   FREET4 0.92 06/16/2015      Assessment & Plan:   1. Uncontrolled type 2 diabetes mellitus with hyperglycemia (  Jonathan Hensley)  - Jonathan Hensley has currently uncontrolled symptomatic type 2 DM since  78 years of age. -His most recent labs show A1c of 8.8%, and hypothyroidism.  -His blood glucose profile shows slightly above target profile.     -his diabetes is complicated by retinopathy and he remains at a high risk for more acute and chronic complications which include CAD, CVA, CKD, retinopathy, and neuropathy. These are all  discussed in detail with him.  - I have counseled him on diet management and weight loss, by adopting a carbohydrate restricted/protein rich diet.  - he  admits there is a room for improvement in his diet and drink choices. -  Suggestion is made for him to avoid simple carbohydrates  from his diet including Cakes, Sweet Desserts / Pastries, Ice Cream, Soda (diet and regular), Sweet Tea, Candies, Chips, Cookies, Sweet Pastries,  Store Bought Juices, Alcohol in Excess of  1-2 drinks a day, Artificial Sweeteners, Coffee Creamer, and "Sugar-free" Products. This will help patient to have stable blood glucose profile and potentially avoid unintended weight gain.   - I encouraged him to switch to  unprocessed or minimally processed complex starch and increased protein intake (animal or plant source), fruits, and vegetables.  - he is advised to stick to a routine mealtimes to eat 3 meals  a day and avoid unnecessary snacks ( to snack only to correct hypoglycemia).    - I have approached him with the following individualized plan to manage diabetes and patient agrees:    -Given  his current glycemic burden, he will continue to need insulin treatment in order for him to achieve control of diabetes to target.  -He is already benefited from simplified treatment approach.  -I discussed in increase his Lantus to 34 units nightly , associated with monitoring of blood glucose twice a day-daily before breakfast and at bedtime.  -He is advised to continue metformin 500 mg ER p.o. twice daily after breakfast and supper.   - he will be considered for incretin therapy as appropriate next visit.  - Patient specific target  A1c;  LDL, HDL, Triglycerides, and  Waist Circumference were discussed in detail.  2) Blood Pressure /Hypertension: he is advised to home monitor blood pressure and report if > 140/90 on 2 separate readings.  he is advised to continue his current medications including benazepril 40 mg p.o.  daily with breakfast .   3) Lipids/Hyperlipidemia:   Review of his recent lipid panel showed  controlled  LDL at 53 .  he  is advised to continue    Zocor 20 mg daily at bedtime, as well as Zetia 10 mg p.o. nightly.   Side effects and precautions discussed with him.  4)  Weight/Diet:    I discussed with him the fact that loss of 5 - 10% of his  current body weight will have the most impact on his diabetes management.  CDE Consult will be initiated . Exercise, and detailed carbohydrates information provided  -  detailed on discharge instructions.   5) Hypothyroidism:   -He tolerates Synthroid for hypothyroidism, advised to continue Synthroid 25 mcg p.o. daily before breakfast.  - We discussed about the correct intake of his thyroid hormone, on empty stomach at fasting, with water, separated by at least 30 minutes from breakfast and other medications,  and separated by more than 4 hours from calcium, iron, multivitamins, acid reflux medications (PPIs). -Patient is made aware of the fact that thyroid hormone replacement is needed  for life, dose to be adjusted by periodic monitoring of thyroid function tests.   6) Chronic Care/Health Maintenance:  -he  is on ACEI/ARB and Statin medications and  is encouraged to initiate and continue to follow up with Ophthalmology, Dentist,  Podiatrist at least yearly or according to recommendations, and advised to   stay away from smoking. I have recommended yearly flu vaccine and pneumonia vaccine at least every 5 years; moderate intensity exercise for up to 150 minutes weekly; and  sleep for at least 7 hours a day.  - he is  advised to maintain close follow up with Andromeda Poppen, Marella Chimes, MD for primary care needs, as well as his other providers for optimal and coordinated care.  - Patient Care Time Today:  25 min, of which >50% was spent in  counseling and the rest reviewing his  current and  previous labs/studies, previous treatments, his blood glucose  readings, and medications' doses and developing a plan for long-term care based on the latest recommendations for standards of care.   Jonathan Hensley participated in the discussions, expressed understanding, and voiced agreement with the above plans.  All questions were answered to his satisfaction. he is encouraged to contact clinic should he have any questions or concerns prior to his return visit.   Follow up plan: - Return in about 4 months (around 05/23/2019) for Bring Meter and Logs- A1c in Office.  Glade Lloyd, MD [facility] [address] [place], [place] [address] Phone: [phone]  Fax: [phone]    01/21/2019, 4:04 PM  This note was partially dictated with voice recognition software. Similar sounding words can be transcribed inadequately or may not  be corrected upon review.

## 2019-02-06 DIAGNOSIS — Z23 Encounter for immunization: Secondary | ICD-10-CM | POA: Diagnosis not present

## 2019-03-17 DIAGNOSIS — E1151 Type 2 diabetes mellitus with diabetic peripheral angiopathy without gangrene: Secondary | ICD-10-CM | POA: Diagnosis not present

## 2019-03-17 DIAGNOSIS — L851 Acquired keratosis [keratoderma] palmaris et plantaris: Secondary | ICD-10-CM | POA: Diagnosis not present

## 2019-03-17 DIAGNOSIS — B351 Tinea unguium: Secondary | ICD-10-CM | POA: Diagnosis not present

## 2019-03-17 DIAGNOSIS — M79675 Pain in left toe(s): Secondary | ICD-10-CM | POA: Diagnosis not present

## 2019-03-17 DIAGNOSIS — M79674 Pain in right toe(s): Secondary | ICD-10-CM | POA: Diagnosis not present

## 2019-03-25 DIAGNOSIS — H35072 Retinal telangiectasis, left eye: Secondary | ICD-10-CM | POA: Diagnosis not present

## 2019-03-25 DIAGNOSIS — E119 Type 2 diabetes mellitus without complications: Secondary | ICD-10-CM | POA: Diagnosis not present

## 2019-03-25 DIAGNOSIS — H33031 Retinal detachment with giant retinal tear, right eye: Secondary | ICD-10-CM | POA: Diagnosis not present

## 2019-03-25 DIAGNOSIS — Z794 Long term (current) use of insulin: Secondary | ICD-10-CM | POA: Diagnosis not present

## 2019-04-01 ENCOUNTER — Ambulatory Visit (INDEPENDENT_AMBULATORY_CARE_PROVIDER_SITE_OTHER): Payer: Medicare Other | Admitting: Urology

## 2019-04-01 DIAGNOSIS — Z8546 Personal history of malignant neoplasm of prostate: Secondary | ICD-10-CM | POA: Diagnosis not present

## 2019-05-16 ENCOUNTER — Emergency Department (HOSPITAL_COMMUNITY): Payer: Medicare Other

## 2019-05-16 ENCOUNTER — Emergency Department (HOSPITAL_COMMUNITY)
Admission: EM | Admit: 2019-05-16 | Discharge: 2019-05-16 | Disposition: A | Discharge status: Home / Self Care | Payer: Medicare Other | Attending: Emergency Medicine | Admitting: Emergency Medicine

## 2019-05-16 ENCOUNTER — Other Ambulatory Visit: Payer: Self-pay

## 2019-05-16 ENCOUNTER — Encounter (HOSPITAL_COMMUNITY): Payer: Self-pay

## 2019-05-16 DIAGNOSIS — I10 Essential (primary) hypertension: Secondary | ICD-10-CM | POA: Diagnosis not present

## 2019-05-16 DIAGNOSIS — Z7901 Long term (current) use of anticoagulants: Secondary | ICD-10-CM | POA: Diagnosis not present

## 2019-05-16 DIAGNOSIS — Z79899 Other long term (current) drug therapy: Secondary | ICD-10-CM | POA: Diagnosis not present

## 2019-05-16 DIAGNOSIS — E039 Hypothyroidism, unspecified: Secondary | ICD-10-CM | POA: Insufficient documentation

## 2019-05-16 DIAGNOSIS — Z8546 Personal history of malignant neoplasm of prostate: Secondary | ICD-10-CM | POA: Insufficient documentation

## 2019-05-16 DIAGNOSIS — Z888 Allergy status to other drugs, medicaments and biological substances status: Secondary | ICD-10-CM | POA: Diagnosis not present

## 2019-05-16 DIAGNOSIS — E119 Type 2 diabetes mellitus without complications: Secondary | ICD-10-CM | POA: Insufficient documentation

## 2019-05-16 DIAGNOSIS — Z794 Long term (current) use of insulin: Secondary | ICD-10-CM | POA: Insufficient documentation

## 2019-05-16 DIAGNOSIS — M545 Low back pain, unspecified: Secondary | ICD-10-CM

## 2019-05-16 DIAGNOSIS — Z87891 Personal history of nicotine dependence: Secondary | ICD-10-CM | POA: Insufficient documentation

## 2019-05-16 DIAGNOSIS — I4891 Unspecified atrial fibrillation: Secondary | ICD-10-CM | POA: Insufficient documentation

## 2019-05-16 DIAGNOSIS — E782 Mixed hyperlipidemia: Secondary | ICD-10-CM | POA: Insufficient documentation

## 2019-05-16 LAB — CBC WITH DIFFERENTIAL/PLATELET
Abs Immature Granulocytes: 0.01 10*3/uL (ref 0.00–0.07)
Basophils Absolute: 0 10*3/uL (ref 0.0–0.1)
Basophils Relative: 1 %
Eosinophils Absolute: 0 10*3/uL (ref 0.0–0.5)
Eosinophils Relative: 1 %
HCT: 38.9 % — ABNORMAL LOW (ref 39.0–52.0)
Hemoglobin: 12.9 g/dL — ABNORMAL LOW (ref 13.0–17.0)
Immature Granulocytes: 0 %
Lymphocytes Relative: 19 %
Lymphs Abs: 0.8 10*3/uL (ref 0.7–4.0)
MCH: 30.9 pg (ref 26.0–34.0)
MCHC: 33.2 g/dL (ref 30.0–36.0)
MCV: 93.3 fL (ref 80.0–100.0)
Monocytes Absolute: 0.7 10*3/uL (ref 0.1–1.0)
Monocytes Relative: 18 %
Neutro Abs: 2.4 10*3/uL (ref 1.7–7.7)
Neutrophils Relative %: 61 %
Platelets: 208 10*3/uL (ref 150–400)
RBC: 4.17 MIL/uL — ABNORMAL LOW (ref 4.22–5.81)
RDW: 12.6 % (ref 11.5–15.5)
WBC: 3.9 10*3/uL — ABNORMAL LOW (ref 4.0–10.5)
nRBC: 0 % (ref 0.0–0.2)

## 2019-05-16 LAB — COMPREHENSIVE METABOLIC PANEL
ALT: 15 U/L (ref 0–44)
AST: 17 U/L (ref 15–41)
Albumin: 4 g/dL (ref 3.5–5.0)
Alkaline Phosphatase: 50 U/L (ref 38–126)
Anion gap: 10 (ref 5–15)
BUN: 28 mg/dL — ABNORMAL HIGH (ref 8–23)
CO2: 21 mmol/L — ABNORMAL LOW (ref 22–32)
Calcium: 9.1 mg/dL (ref 8.9–10.3)
Chloride: 103 mmol/L (ref 98–111)
Creatinine, Ser: 1.33 mg/dL — ABNORMAL HIGH (ref 0.61–1.24)
GFR calc Af Amer: 59 mL/min — ABNORMAL LOW (ref >60)
GFR calc non Af Amer: 51 mL/min — ABNORMAL LOW (ref >60)
Glucose, Bld: 222 mg/dL — ABNORMAL HIGH (ref 70–99)
Potassium: 4.1 mmol/L (ref 3.5–5.1)
Sodium: 134 mmol/L — ABNORMAL LOW (ref 135–145)
Total Bilirubin: 0.5 mg/dL (ref 0.3–1.2)
Total Protein: 7.4 g/dL (ref 6.5–8.1)

## 2019-05-16 LAB — URINALYSIS, ROUTINE W REFLEX MICROSCOPIC
Bacteria, UA: NONE SEEN
Bilirubin Urine: NEGATIVE
Glucose, UA: 50 mg/dL — AB
Ketones, ur: 5 mg/dL — AB
Leukocytes,Ua: NEGATIVE
Nitrite: NEGATIVE
Protein, ur: 30 mg/dL — AB
Specific Gravity, Urine: 1.015 (ref 1.005–1.030)
pH: 5 (ref 5.0–8.0)

## 2019-05-16 LAB — CBG MONITORING, ED: Glucose-Capillary: 215 mg/dL — ABNORMAL HIGH (ref 70–99)

## 2019-05-16 MED ORDER — KETOROLAC TROMETHAMINE 30 MG/ML IJ SOLN
15.0000 mg | Freq: Once | INTRAMUSCULAR | Status: AC
  Administered 2019-05-16: 09:00:00 15 mg via INTRAVENOUS
  Filled 2019-05-16: qty 1

## 2019-05-16 MED ORDER — LIDOCAINE 5 % EX PTCH
1.0000 | MEDICATED_PATCH | CUTANEOUS | Status: DC
  Administered 2019-05-16: 1 via TRANSDERMAL
  Filled 2019-05-16: qty 1

## 2019-05-16 MED ORDER — SODIUM CHLORIDE 0.9 % IV BOLUS
1000.0000 mL | Freq: Once | INTRAVENOUS | Status: AC
  Administered 2019-05-16: 1000 mL via INTRAVENOUS

## 2019-05-16 MED ORDER — ONDANSETRON HCL 4 MG/2ML IJ SOLN
4.0000 mg | Freq: Once | INTRAMUSCULAR | Status: AC
  Administered 2019-05-16: 09:00:00 4 mg via INTRAVENOUS
  Filled 2019-05-16: qty 2

## 2019-05-16 MED ORDER — DILTIAZEM HCL ER COATED BEADS 120 MG PO CP24: 120.0000 mg | ORAL_CAPSULE | Freq: Every day | ORAL | 0 refills | Status: DC

## 2019-05-16 MED ORDER — DILTIAZEM HCL 30 MG PO TABS
30.0000 mg | ORAL_TABLET | Freq: Once | ORAL | Status: AC
  Administered 2019-05-16: 09:00:00 30 mg via ORAL
  Filled 2019-05-16: qty 1

## 2019-05-16 MED ORDER — METHOCARBAMOL 500 MG PO TABS: 500.0000 mg | ORAL_TABLET | Freq: Two times a day (BID) | ORAL | 0 refills | Status: DC

## 2019-05-16 MED ORDER — MORPHINE SULFATE (PF) 4 MG/ML IV SOLN
4.0000 mg | Freq: Once | INTRAVENOUS | Status: DC
  Filled 2019-05-16: qty 1

## 2019-05-16 MED ORDER — ONDANSETRON HCL 4 MG/2ML IJ SOLN
4.0000 mg | Freq: Once | INTRAMUSCULAR | Status: AC
  Administered 2019-05-16: 4 mg via INTRAVENOUS
  Filled 2019-05-16: qty 2

## 2019-05-16 MED ORDER — LIDOCAINE 5 % EX PTCH: 1.0000 | MEDICATED_PATCH | CUTANEOUS | 0 refills | Status: DC

## 2019-05-16 MED ORDER — FENTANYL CITRATE (PF) 100 MCG/2ML IJ SOLN
50.0000 ug | Freq: Once | INTRAMUSCULAR | Status: AC
  Administered 2019-05-16: 09:00:00 50 ug via INTRAVENOUS
  Filled 2019-05-16: qty 2

## 2019-05-16 NOTE — ED Triage Notes (Signed)
Pt reports left flank pain for 2 days. No urinary issues

## 2019-05-16 NOTE — ED Provider Notes (Signed)
[facility] EMERGENCY DEPARTMENT Provider Note   CSN: [id number] Arrival date & time: 05/16/19  0750     History Chief Complaint  Patient presents with  . Flank Pain    Jonathan Hensley is a 79 y.o. male.  HPI      Jonathan Hensley is a 79 y.o. male, with a history of A. fib on Eliquis, DM, HTN, degenerative disc disease, presenting to the ED with left lower back pain beginning 2 days ago.  Patient has difficulty describing the pain, simply stating, "it hurts a lot."  Pain is severe, waxing and waning, worse with movement and palpation, and nonradiating.  Accompanied by nausea.  He has had a history of back pain and multiple spinal surgeries, however, he has not had pain similar to his present pain.  He adds that this morning he tried to get up out of a chair, was hit with a sudden increase in his pain, and had to lower himself to the ground.  Denies head injury or any other injuries. Last bowel movement was yesterday and was normal.  Denies fever/chills, vomiting, diarrhea, hematochezia/melena, numbness/weakness, chest pain, shortness of breath, abdominal pain, urinary symptoms, changes in bowel or bladder function, saddle anesthesias, trauma, or any other complaints.  Past Medical History:  Diagnosis Date  . Arthritis    rheumatoid in his hands  . Atrial fibrillation ([place])   . Bulging lumbar disc   . Cancer ([place])   . DDD (degenerative disc disease), cervical   . Diabetes mellitus   . Diabetic retinopathy ([place])   . Double vision   . Dysrhythmia    Atrial Fibrillation  . History of prostate cancer   . Hypertension   . Low back pain   . Nephrolithiasis   . Sleep apnea    uses cpap    Patient Active Problem List   Diagnosis Date Noted  . Hypothyroidism 10/11/2018  . Uncontrolled type 2 diabetes mellitus with hyperglycemia ([place]) 09/30/2018  . Essential hypertension, benign 09/30/2018  . Word finding difficulty 12/01/2016  . Obstructive sleep apnea on CPAP 11/02/2016  .  Atrial fibrillation with rapid ventricular response ([place]) 06/13/2016  . Prostatitis 06/13/2016  . Proctitis   . Rectal bleeding   . OSA (obstructive sleep apnea) 09/02/2014  . HNP (herniated nucleus pulposus), lumbar 08/18/2013  . Uncontrolled type 2 diabetes mellitus with stable proliferative retinopathy of left eye, with long-term current use of insulin ([address]) 12/03/2012  . Anemia 12/03/2012  . Occipital scalp laceration 12/19/2011  . DM type 2 (diabetes mellitus, type 2) ([place]) 12/19/2011  . Essential hypertension 12/19/2011  . Diabetic retinopathy associated with type 2 diabetes mellitus ([place]) 12/19/2011  . Mixed hyperlipidemia 12/19/2011  . History of prostate cancer   . Prostate cancer ([place]) 03/31/2011    Past Surgical History:  Procedure Laterality Date  . APPENDECTOMY    . BACK SURGERY    . Bilateral L4-5 lumbar decompression including bilateral  11/2008  . brachytherapy    . COLONOSCOPY    . CYSTOSCOPY    . Left L4-L5 lumbar laminotomy and microdiskectomy with  07/14/2008    . LITHOTRIPSY    . LUMBAR LAMINECTOMY/DECOMPRESSION MICRODISCECTOMY Left 08/18/2013   Procedure: Left Lumbar Five-Sacral One Microdiskectomy;  Surgeon: [name], MD;  Location: [place] [facility];  Service: Neurosurgery;  Laterality: Left;  Left Lumbar Five-Sacral One Microdiskectomy  . rotator cuff surgery    . Scleral buckle, laser photocoagulation, and anterior chamber  01/04/2009  Family History  Problem Relation Age of Onset  . Heart failure Father   . Heart failure Mother   . Heart failure Brother        Half brother.  . Colon cancer Neg Hx     Social History   Tobacco Use  . Smoking status: Former Smoker    Quit date: 02/12/1973    Years since quitting: 46.2  . Smokeless tobacco: Never Used  . Tobacco comment: starting at age 89  Substance Use Topics  . Alcohol use: No  . Drug use: No    Home Medications Prior to Admission medications   Medication Sig Start Date End  Date Taking? Authorizing Provider  Blood Glucose Monitoring Suppl (FREESTYLE FREEDOM LITE) w/Device KIT 1 each by Does not apply route 4 (four) times daily. USE BLOOD GLUCOSE MONITOR TO CHECK BLOOD SUGAR. 06/18/18   [name], MD  diltiazem (CARDIZEM CD) 120 MG 24 hr capsule Take 1 capsule (120 mg total) by mouth daily. 05/16/19   Harlowe Dowler C, PA-C  ELIQUIS 5 MG TABS tablet TAKE 1 TABLET TWICE A DAY 10/01/18   Adrian Prows, MD  ezetimibe (ZETIA) 10 MG tablet TAKE 1 TABLET EVERY EVENING AFTER DINNER 10/01/18   Adrian Prows, MD  FREESTYLE LITE test strip USE TO CHECK BLOOD SUGAR FOUR TIMES A DAY AS INSTRUCTED 12/27/17   [name], MD  Insulin Glargine, 2 Unit Dial, (TOUJEO MAX SOLOSTAR) 300 UNIT/ML SOPN Inject 34 Units into the skin at bedtime.    [provider]  Insulin Pen Needle (B-D ULTRAFINE III SHORT PEN) 31G X 8 MM MISC 1 each by Does not apply route as directed. 10/11/18   Cassandria Anger, MD  Lancets (FREESTYLE) lancets Use as instructed to check blood sugar 4 times per day dx code 250.00 12/10/13   [name], MD  lidocaine (LIDODERM) 5 % Place 1 patch onto the skin daily. Remove & Discard patch within 12 hours or as directed by MD 05/16/19   Letonya Mangels, Helane Gunther, PA-C  metFORMIN (GLUCOPHAGE-XR) 500 MG 24 hr tablet Take 1 tablet (500 mg total) by mouth 2 (two) times daily with a meal. 09/30/18   Nida, Marella Chimes, MD  methocarbamol (ROBAXIN) 500 MG tablet Take 1 tablet (500 mg total) by mouth 2 (two) times daily. 05/16/19   Teala Daffron C, PA-C  simvastatin (ZOCOR) 20 MG tablet TAKE 1 TABLET EVERY EVENING AFTER DINNER 10/01/18   Adrian Prows, MD  SYNTHROID 25 MCG tablet Take 1 tablet (25 mcg total) by mouth daily before breakfast. 10/21/18   Cassandria Anger, MD    Allergies    Beta adrenergic blockers, Other, and Tramadol  Review of Systems   Review of Systems  Constitutional: Negative for chills, diaphoresis and fever.  Respiratory: Negative for cough and shortness of breath.     Cardiovascular: Negative for chest pain and leg swelling.  Gastrointestinal: Positive for nausea. Negative for abdominal pain, blood in stool, constipation, diarrhea and vomiting.  Genitourinary: Negative for difficulty urinating, dysuria, flank pain, frequency, hematuria, scrotal swelling and testicular pain.  Musculoskeletal: Positive for back pain.  Neurological: Negative for dizziness, syncope, weakness and numbness.  All other systems reviewed and are negative.   Physical Exam Updated Vital Signs BP (!) 145/93 (BP Location: Left Arm)   Pulse (!) 114   Temp 98 F (36.7 C) (Oral)   Resp 18   Wt 92.1 kg   SpO2 96%   BMI 29.13 kg/m   Physical Exam  Vitals and nursing note reviewed.  Constitutional:      General: He is not in acute distress.    Appearance: He is well-developed. He is not diaphoretic.  HENT:     Head: Normocephalic and atraumatic.     Mouth/Throat:     Mouth: Mucous membranes are moist.     Pharynx: Oropharynx is clear.  Eyes:     Conjunctiva/sclera: Conjunctivae normal.  Cardiovascular:     Rate and Rhythm: Tachycardia present. Rhythm irregularly irregular.     Pulses: Normal pulses.          Radial pulses are 2+ on the right side and 2+ on the left side.       Posterior tibial pulses are 2+ on the right side and 2+ on the left side.     Heart sounds: Normal heart sounds.     Comments: Tactile temperature in the extremities appropriate and equal bilaterally. Pulmonary:     Effort: Pulmonary effort is normal. No respiratory distress.     Breath sounds: Normal breath sounds.  Abdominal:     Palpations: Abdomen is soft.     Tenderness: There is no abdominal tenderness. There is no guarding.  Musculoskeletal:     Cervical back: Neck supple.     Lumbar back: Tenderness present. No bony tenderness.       Back:     Right lower leg: No edema.     Left lower leg: No edema.     Comments: Tenderness to the left lumbar musculature with even light  palpation. Normal motor function intact in all extremities. No midline spinal tenderness.   Lymphadenopathy:     Cervical: No cervical adenopathy.  Skin:    General: Skin is warm and dry.  Neurological:     Mental Status: He is alert.     Comments: Sensation grossly intact to light touch in the extremities.  Grip strengths equal bilaterally.  Strength 5/5 in all extremities. Coordination intact. Cranial nerves III-XII grossly intact. No facial droop.   After pain was adequately controlled, patient ambulated without noted difficulty or onset of symptoms.  Psychiatric:        Mood and Affect: Mood and affect normal.        Speech: Speech normal.        Behavior: Behavior normal.     ED Results / Procedures / Treatments   Labs (all labs ordered are listed, but only abnormal results are displayed) Labs Reviewed  COMPREHENSIVE METABOLIC PANEL - Abnormal; Notable for the following components:      Result Value   Sodium 134 (*)    CO2 21 (*)    Glucose, Bld 222 (*)    BUN 28 (*)    Creatinine, Ser 1.33 (*)    GFR calc non Af Amer 51 (*)    GFR calc Af Amer 59 (*)    All other components within normal limits  CBC WITH DIFFERENTIAL/PLATELET - Abnormal; Notable for the following components:   WBC 3.9 (*)    RBC 4.17 (*)    Hemoglobin 12.9 (*)    HCT 38.9 (*)    All other components within normal limits  URINALYSIS, ROUTINE W REFLEX MICROSCOPIC - Abnormal; Notable for the following components:   Glucose, UA 50 (*)    Hgb urine dipstick MODERATE (*)    Ketones, ur 5 (*)    Protein, ur 30 (*)    All other components within normal limits  CBG MONITORING, ED - Abnormal;  Notable for the following components:   Glucose-Capillary 215 (*)    All other components within normal limits  URINE CULTURE    BUN  Date Value Ref Range Status  05/16/2019 28 (H) 8 - 23 mg/dL Final  01/14/2019 39 (H) 7 - 25 mg/dL Final  09/30/2018 39 (H) 7 - 25 mg/dL Final  06/18/2018 27 (H) 6 - 23 mg/dL  Final   Creat  Date Value Ref Range Status  01/14/2019 1.32 (H) 0.70 - 1.18 mg/dL Final    Comment:    For patients >54 years of age, the reference limit for Creatinine is approximately 13% higher for people identified as African-American. .   09/30/2018 1.26 (H) 0.70 - 1.18 mg/dL Final    Comment:    For patients >13 years of age, the reference limit for Creatinine is approximately 13% higher for people identified as African-American. Marland Kitchen   06/02/2013 1.08 0.50 - 1.35 mg/dL Final   Creatinine, Ser  Date Value Ref Range Status  05/16/2019 1.33 (H) 0.61 - 1.24 mg/dL Final  06/18/2018 1.13 0.40 - 1.50 mg/dL Final  03/26/2018 1.27 0.40 - 1.50 mg/dL Final  12/21/2017 1.38 0.40 - 1.50 mg/dL Final     EKG EKG Interpretation  Date/Time:  Friday May 16 2019 08:09:10 EST Ventricular Rate:  114 PR Interval:    QRS Duration: 96 QT Interval:  342 QTC Calculation: 471 R Axis:   79 Text Interpretation: Atrial fibrillation Since last tracing rate faster Confirmed by Noemi Chapel [phone]) on 05/16/2019 8:56:48 AM    Radiology CT Renal Stone Study  Result Date: 05/16/2019 CLINICAL DATA:  Left flank pain for 2 days. Nausea. History of diabetes. Prostate cancer. Hypertension. Prior lithotripsy. Appendectomy. EXAM: CT ABDOMEN AND PELVIS WITHOUT CONTRAST TECHNIQUE: Multidetector CT imaging of the abdomen and pelvis was performed following the standard protocol without IV contrast. COMPARISON:  06/13/2016 FINDINGS: Lower chest: Clear lung bases. Normal heart size without pericardial or pleural effusion. Left circumflex coronary artery atherosclerosis. Hepatobiliary: Well-circumscribed low-density liver lesions are likely cysts. Normal gallbladder, without biliary ductal dilatation. Pancreas: Fatty replacement involving the pancreatic head and uncinate process. No duct dilatation. Spleen: Normal in size, without focal abnormality. Adrenals/Urinary Tract: Normal right adrenal gland. Mild left  adrenal thickening and nodularity are similar. Mild left renal cortical thinning. Left renal vascular calcification. No renal calculi or hydronephrosis. No hydroureter or ureteric calculi. No bladder calculi. Stomach/Bowel: Normal stomach, without wall thickening. Colonic stool burden suggests constipation. Normal terminal ileum. Normal small bowel. Vascular/Lymphatic: Aortic and branch vessel atherosclerosis. No abdominopelvic adenopathy. Reproductive: Radiation seeds in the prostate. Other: No significant free fluid. Musculoskeletal: No acute osseous abnormality. L4-5 trans pedicle screw fixation IMPRESSION: 1. Left renal vascular calcifications, without convincing evidence of renal calculi. No obstructive uropathy or distal stone. 2.  Possible constipation. 3. Coronary artery atherosclerosis. Aortic Atherosclerosis (ICD10-I70.0). 4. Radiation seeds in the prostate; no evidence of metastatic disease. Electronically Signed   By: Abigail Miyamoto M.D.   On: 05/16/2019 10:00    Procedures Procedures (including critical care time)  Medications Ordered in ED Medications  lidocaine (LIDODERM) 5 % 1 patch (1 patch Transdermal Patch Applied 05/16/19 1144)  ondansetron (ZOFRAN) injection 4 mg (4 mg Intravenous Given 05/16/19 0852)  sodium chloride 0.9 % bolus 1,000 mL (0 mLs Intravenous Stopped 05/16/19 1032)  diltiazem (CARDIZEM) tablet 30 mg (30 mg Oral Given 05/16/19 0903)  ketorolac (TORADOL) 30 MG/ML injection 15 mg (15 mg Intravenous Given 05/16/19 0900)  fentaNYL (SUBLIMAZE) injection 50 mcg (  50 mcg Intravenous Given 05/16/19 0903)  ondansetron (ZOFRAN) injection 4 mg (4 mg Intravenous Given 05/16/19 0946)    ED Course  I have reviewed the triage vital signs and the nursing notes.  Pertinent labs & imaging results that were available during my care of the patient were reviewed by me and considered in my medical decision making (see chart for details).  Clinical Course as of May 15 1222  Fri May 16, 2019  0819  Patient states he has a distant history of A. fib  Pulse Rate(!): 114 [SJ]  0845 Spoke with Donna Christen, pharmacist, to discuss oral dosing of diltiazem for rate control.  He states a good starting point for immediate release diltiazem 30 mg.  If this is effective and we were to start him on an extended release dosing, we could then prescribe 120 mg extended release with first dose given about 6 hours after the immediate release.   [SJ]  0930 Patient states pain has significantly improved. He does voice recurrence of his nausea.   [SJ]    Clinical Course User Index [SJ] Liahna Brickner, Helane Gunther, PA-C   MDM Rules/Calculators/A&P                      Patient presents with lower left back pain. Patient is nontoxic appearing, afebrile, not tachypneic, not hypotensive, maintains excellent SPO2 on room air. Initially tachycardic and noted to be in A. fib.  He does not seem to be symptomatic to this.  Adequate oral rate control with oral diltiazem. Lab work overall reassuring.  No acute abnormalities noted on CT renal stone study. We were able to achieve adequate pain control.  Suspect patient's pain may stem from muscular source. Dissection was considered, but thought less likely due to the duration of symptoms, reproducibility on exam, focal rather than diffuse area of pain, equal bilateral pulses, lack of neurodeficits.  Atherosclerosis in the abdominal aorta without aortic aneurysm or other noted abnormality on CT of the abdomen/pelvis January 2018.  A message was sent to the patient's cardiologist, Dr. Einar Gip, for follow-up on the patient's A. fib. The patient was given instructions for home care as well as return precautions. Patient voices understanding of these instructions, accepts the plan, and is comfortable with discharge.  Findings and plan of care discussed with Noemi Chapel, MD. Dr. Sabra Heck personally evaluated and examined this patient.  Vitals:   05/16/19 0945 05/16/19 1000 05/16/19 1030 05/16/19  1100  BP:  119/73 115/74 117/69  Pulse: 94 88 90 95  Resp: _0 Temp:      TempSrc:      SpO2: 94% 94% 94% 94%  Weight:         Final Clinical Impression(s) / ED Diagnoses Final diagnoses:  Acute left-sided low back pain without sciatica    Rx / DC Orders ED Discharge Orders         Ordered    diltiazem (CARDIZEM CD) 120 MG 24 hr capsule  Daily     05/16/19 1200    methocarbamol (ROBAXIN) 500 MG tablet  2 times daily     05/16/19 1200    lidocaine (LIDODERM) 5 %  Every 24 hours     05/16/19 1200           Lorayne Bender, PA-C 05/16/19 1227    Noemi Chapel, MD 05/26/19 1747

## 2019-05-16 NOTE — ED Notes (Signed)
Pt ambulated with pulse ox, pt HR in 120s-130s while ambulating, PA notified

## 2019-05-16 NOTE — Discharge Instructions (Addendum)
Take it easy, but do not lay around too much as this may make any stiffness worse.  Acetaminophen: May take acetaminophen (generic for Tylenol), as needed, for pain. Your daily total maximum amount of acetaminophen from all sources should be limited to 4000mg /day for persons without liver problems, or 2000mg /day for those with liver problems. Methocarbamol: Methocarbamol (generic for Robaxin) is a muscle relaxer and can help relieve stiff muscles or muscle spasms.  Do not drive or perform other dangerous activities while taking this medication as it can cause drowsiness as well as changes in reaction time and judgement. Lidocaine patches: These are available via either prescription or over-the-counter. The over-the-counter option may be more economical one and are likely just as effective. There are multiple over-the-counter brands, such as Salonpas. Exercises: Be sure to perform the attached exercises starting with three times a week and working up to performing them daily. This is an essential part of preventing long term problems.  Follow up: Follow up with a primary care provider for any future management of these complaints. Be sure to follow up within 7-10 days. Return: Return to the ED should symptoms worsen.  For prescription assistance, may try using prescription discount sites or apps, such as goodrx.com  Your heart rate was higher than normal today and you were noted to be in A. fib.  We have prescribed a medication to help control the higher heart rate in the setting of A. fib.  Please follow-up with your cardiologist as soon as possible on this matter.  Return to the emergency department for shortness of breath, chest pain, dizziness, passing out, or any other major concerns.

## 2019-05-16 NOTE — ED Notes (Signed)
Pt reports having the opportunity to have his questions answered, pt verbalizes understanding of follow up care, medication regimen, and discharge instructions

## 2019-05-16 NOTE — ED Provider Notes (Signed)
Reproducible lower back pain to the left lower back - reproducible to palpation - worse with movement.  No urinary sx - no fevers no n/v. No meds prior to arrival.  Mild tachycarida on arrival, no fever.  He is in afib and is anticoagulated.  Control pain - r/o KS.  Seems uncomfortable with moving around - seems MSK in origin,   On eliquis, no rate control.  With blood pressure of 145/93 and a heart rate of 115 on average the patient would benefit from a dose of metoprolol though this may be pain related.  Medical screening examination/treatment/procedure(s) were conducted as a shared visit with non-physician practitioner(s) and myself.  I personally evaluated the patient during the encounter.  Clinical Impression:   Final diagnoses:  Acute left-sided low back pain without sciatica           Noemi Chapel, MD 05/26/19 1746

## 2019-05-17 LAB — URINE CULTURE: Culture: <10000 — AB

## 2019-05-21 ENCOUNTER — Telehealth: Payer: Self-pay

## 2019-05-21 NOTE — Telephone Encounter (Signed)
-----   Message from Adrian Prows, MD sent at 05/18/2019  4:11 PM EST ----- Regarding: FW: Can you call and see if he is doing well and if he would like to come in to be seen.  JG ----- Message ----- From: Layla Maw Sent: 05/16/2019  11:32 AM EST To: Adrian Prows, MD  Dr. Einar Gip,  I saw this patient in the ED for back pain, however, while here we noted him to be in Afib with a mild RVR component. He did not seem to be symptomatic to it and we were able to obtain a better heart rate after controlling his pain and giving an oral dose of diltiazem. We were hoping he could follow up with your regarding any further management of this issue.    Thank you,    Shawn   Lorayne Bender, PA-C

## 2019-05-21 NOTE — Telephone Encounter (Signed)
LMTCB if any problems with HR or AFIB

## 2019-05-22 ENCOUNTER — Ambulatory Visit: Payer: Medicare Other | Attending: Internal Medicine

## 2019-05-22 ENCOUNTER — Other Ambulatory Visit: Payer: Self-pay

## 2019-05-22 DIAGNOSIS — Z20822 Contact with and (suspected) exposure to covid-19: Secondary | ICD-10-CM

## 2019-05-24 LAB — NOVEL CORONAVIRUS, NAA: SARS-CoV-2, NAA: DETECTED — AB

## 2019-05-26 ENCOUNTER — Ambulatory Visit: Payer: Medicare Other | Admitting: "Endocrinology

## 2019-05-29 ENCOUNTER — Other Ambulatory Visit: Payer: Self-pay

## 2019-05-29 DIAGNOSIS — C61 Malignant neoplasm of prostate: Secondary | ICD-10-CM

## 2019-06-10 ENCOUNTER — Ambulatory Visit: Payer: Medicare Other | Admitting: Nutrition

## 2019-06-16 ENCOUNTER — Telehealth: Payer: Self-pay

## 2019-06-16 NOTE — Telephone Encounter (Signed)
Patient's wife called and stated that patient had forgotten to take his Eliquis since Dec 30th, 2020. She was asking if it would be ok for him to restart immediately or should he be seen first. Please advise.

## 2019-06-16 NOTE — Telephone Encounter (Signed)
Can restart. No need to see. IT is safe to do so

## 2019-06-16 NOTE — Telephone Encounter (Signed)
Spoke with patient wife. He will restart.

## 2019-06-26 ENCOUNTER — Ambulatory Visit: Payer: Medicare Other | Admitting: "Endocrinology

## 2019-06-26 ENCOUNTER — Ambulatory Visit (INDEPENDENT_AMBULATORY_CARE_PROVIDER_SITE_OTHER): Payer: Medicare Other | Admitting: "Endocrinology

## 2019-06-26 ENCOUNTER — Other Ambulatory Visit: Payer: Self-pay

## 2019-06-26 ENCOUNTER — Encounter: Payer: Self-pay | Admitting: "Endocrinology

## 2019-06-26 VITALS — BP 152/74 | HR 70 | Ht 70.0 in | Wt 205.0 lb

## 2019-06-26 DIAGNOSIS — E782 Mixed hyperlipidemia: Secondary | ICD-10-CM | POA: Diagnosis not present

## 2019-06-26 DIAGNOSIS — I10 Essential (primary) hypertension: Secondary | ICD-10-CM | POA: Diagnosis not present

## 2019-06-26 DIAGNOSIS — E1165 Type 2 diabetes mellitus with hyperglycemia: Secondary | ICD-10-CM

## 2019-06-26 DIAGNOSIS — E039 Hypothyroidism, unspecified: Secondary | ICD-10-CM

## 2019-06-26 LAB — POCT GLYCOSYLATED HEMOGLOBIN (HGB A1C): Hemoglobin A1C: 9.1 % — AB (ref 4.0–5.6)

## 2019-06-26 MED ORDER — EZETIMIBE 10 MG PO TABS: ORAL_TABLET | ORAL | 1 refills | Status: DC

## 2019-06-26 MED ORDER — SYNTHROID 25 MCG PO TABS: 25.0000 ug | ORAL_TABLET | Freq: Every day | ORAL | 1 refills | Status: DC

## 2019-06-26 MED ORDER — GLIPIZIDE ER 5 MG PO TB24: 5.0000 mg | ORAL_TABLET | Freq: Every day | ORAL | 3 refills | Status: DC

## 2019-06-26 MED ORDER — SIMVASTATIN 20 MG PO TABS: ORAL_TABLET | ORAL | 1 refills | Status: DC

## 2019-06-26 NOTE — Progress Notes (Signed)
06/26/2019, 12:00 PM     Endocrinology follow-up note    Subjective:    Patient ID: Jonathan Hensley, male    DOB: 03-01-1941.  Jonathan Hensley is being seen in f/u for management of currently uncontrolled symptomatic diabetes. Previously seen by [name].  Past Medical History:  Diagnosis Date  . Arthritis    rheumatoid in his hands  . Atrial fibrillation ([employer])   . Bulging lumbar disc   . Cancer ([place])   . DDD (degenerative disc disease), cervical   . Diabetes mellitus   . Diabetic retinopathy ([place])   . Double vision   . Dysrhythmia    Atrial Fibrillation  . History of prostate cancer   . Hypertension   . Low back pain   . Nephrolithiasis   . Sleep apnea    uses cpap    Past Surgical History:  Procedure Laterality Date  . APPENDECTOMY    . BACK SURGERY    . Bilateral L4-5 lumbar decompression including bilateral  11/2008  . brachytherapy    . COLONOSCOPY    . CYSTOSCOPY    . Left L4-L5 lumbar laminotomy and microdiskectomy with  07/14/2008    . LITHOTRIPSY    . LUMBAR LAMINECTOMY/DECOMPRESSION MICRODISCECTOMY Left 08/18/2013   Procedure: Left Lumbar Five-Sacral One Microdiskectomy;  Surgeon: [name], MD;  Location: [facility];  Service: Neurosurgery;  Laterality: Left;  Left Lumbar Five-Sacral One Microdiskectomy  . rotator cuff surgery    . Scleral buckle, laser photocoagulation, and anterior chamber  01/04/2009    Social History   Socioeconomic History  . Marital status: Married    Spouse name: [name]  . Number of children: 2  . Years of education: 62  . Highest education level: Not on file  Occupational History  . Not on file  Tobacco Use  . Smoking status: Former Smoker    Quit date: 02/12/1973    Years since quitting: 46.3  . Smokeless tobacco: Never Used  . Tobacco comment: starting at age 69  Substance and Sexual Activity  . Alcohol use: No  . Drug  use: No  . Sexual activity: Yes  Other Topics Concern  . Not on file  Social History Narrative   Lives at home with wife   Drinks 4 cups of coffee a day   Social Determinants of Health   Financial Resource Strain:   . Difficulty of Paying Living Expenses: Not on file  Food Insecurity:   . Worried About Charity fundraiser in the Last Year: Not on file  . Ran Out of Food in the Last Year: Not on file  Transportation Needs:   . Lack of Transportation (Medical): Not on file  . Lack of Transportation (Non-Medical): Not on file  Physical Activity:   . Days of Exercise per Week: Not on file  . Minutes of Exercise per Session: Not on file  Stress:   . Feeling of Stress : Not on file  Social Connections:   . Frequency of Communication with Friends and Family: Not on file  . Frequency of Social Gatherings  with Friends and Family: Not on file  . Attends Religious Services: Not on file  . Active Member of Clubs or Organizations: Not on file  . Attends Archivist Meetings: Not on file  . Marital Status: Not on file    Family History  Problem Relation Age of Onset  . Heart failure Father   . Heart failure Mother   . Heart failure Brother        Half brother.  . Colon cancer Neg Hx     Outpatient Encounter Medications as of 06/26/2019  Medication Sig  . Blood Glucose Monitoring Suppl (FREESTYLE FREEDOM LITE) w/Device KIT 1 each by Does not apply route 4 (four) times daily. USE BLOOD GLUCOSE MONITOR TO CHECK BLOOD SUGAR.  Marland Kitchen diltiazem (CARDIZEM CD) 120 MG 24 hr capsule Take 1 capsule (120 mg total) by mouth daily.  Marland Kitchen ELIQUIS 5 MG TABS tablet TAKE 1 TABLET TWICE A DAY  . ezetimibe (ZETIA) 10 MG tablet TAKE 1 TABLET EVERY EVENING AFTER DINNER  . FREESTYLE LITE test strip USE TO CHECK BLOOD SUGAR FOUR TIMES A DAY AS INSTRUCTED  . glipiZIDE (GLUCOTROL XL) 5 MG 24 hr tablet Take 1 tablet (5 mg total) by mouth daily with breakfast.  . Insulin Glargine, 2 Unit Dial, (TOUJEO MAX  SOLOSTAR) 300 UNIT/ML SOPN Inject 36 Units into the skin at bedtime.  . Insulin Pen Needle (B-D ULTRAFINE III SHORT PEN) 31G X 8 MM MISC 1 each by Does not apply route as directed.  . Lancets (FREESTYLE) lancets Use as instructed to check blood sugar 4 times per day dx code 250.00  . lidocaine (LIDODERM) 5 % Place 1 patch onto the skin daily. Remove & Discard patch within 12 hours or as directed by MD  . simvastatin (ZOCOR) 20 MG tablet TAKE 1 TABLET EVERY EVENING AFTER DINNER  . SYNTHROID 25 MCG tablet Take 1 tablet (25 mcg total) by mouth daily before breakfast.  . [DISCONTINUED] ezetimibe (ZETIA) 10 MG tablet TAKE 1 TABLET EVERY EVENING AFTER DINNER  . [DISCONTINUED] metFORMIN (GLUCOPHAGE-XR) 500 MG 24 hr tablet Take 1 tablet (500 mg total) by mouth 2 (two) times daily with a meal.  . [DISCONTINUED] methocarbamol (ROBAXIN) 500 MG tablet Take 1 tablet (500 mg total) by mouth 2 (two) times daily.  . [DISCONTINUED] simvastatin (ZOCOR) 20 MG tablet TAKE 1 TABLET EVERY EVENING AFTER DINNER  . [DISCONTINUED] SYNTHROID 25 MCG tablet Take 1 tablet (25 mcg total) by mouth daily before breakfast.   No facility-administered encounter medications on file as of 06/26/2019.    ALLERGIES: Allergies  Allergen Reactions  . Beta Adrenergic Blockers Other (See Comments)    Severe dizziness  . Other Other (See Comments)    Beta or alpha blockers: severe dizziness  . Tramadol Other (See Comments)    Dizziness     VACCINATION STATUS: Immunization History  Administered Date(s) Administered  . Influenza Split 02/12/2013  . Influenza, High Dose Seasonal PF 03/26/2018  . Influenza,inj,Quad PF,6+ Mos 02/26/2015  . Influenza-Unspecified 02/17/2014  . Pneumococcal Conjugate-13 11/26/2014  . Tdap 07/21/2012    Diabetes He presents for his follow-up diabetic visit. He has type 2 diabetes mellitus. Onset time: He was diagnosed at approximate age of 75 years. His disease course has been worsening. There are  no hypoglycemic associated symptoms. Pertinent negatives for hypoglycemia include no confusion, headaches, pallor or seizures. Pertinent negatives for diabetes include no chest pain, no fatigue, no polydipsia, no polyphagia, no polyuria and no weakness. There  are no hypoglycemic complications. Symptoms are improving. Diabetic complications include retinopathy. Risk factors for coronary artery disease include dyslipidemia, family history, male sex, sedentary lifestyle and tobacco exposure. Current diabetic treatment includes insulin injections and oral agent (monotherapy) (He is currently on insulin utilizing V-go, as well as metformin 500 mg ER twice daily.). His weight is fluctuating minimally. He is following a generally unhealthy diet. When asked about meal planning, he reported none. He has not had a previous visit with a dietitian. He participates in exercise intermittently. His home blood glucose trend is increasing steadily. His breakfast blood glucose range is generally 130-140 mg/dl. His bedtime blood glucose range is generally 180-200 mg/dl. His overall blood glucose range is 180-200 mg/dl. (His presents with slightly above target glycemic profile was fasting and postprandial.  He discontinued his Metformin.  He did have COVID-19 infection in January, recovered without needing hospital care.  His point-of-care A1c today is 9.1%.) An ACE inhibitor/angiotensin II receptor blocker is being taken. Eye exam is current.  Hyperlipidemia This is a chronic problem. The current episode started more than 1 year ago. The problem is controlled. Exacerbating diseases include diabetes and obesity. Pertinent negatives include no chest pain, myalgias or shortness of breath. Current antihyperlipidemic treatment includes statins. Risk factors for coronary artery disease include diabetes mellitus, dyslipidemia, hypertension, male sex and a sedentary lifestyle.  Hypertension This is a chronic problem. The current episode  started more than 1 year ago. Pertinent negatives include no chest pain, headaches, neck pain, palpitations or shortness of breath. Risk factors for coronary artery disease include dyslipidemia, diabetes mellitus, male gender, obesity, sedentary lifestyle and smoking/tobacco exposure. Past treatments include ACE inhibitors. Hypertensive end-organ damage includes retinopathy.    Review of systems  Constitutional: + Minimally fluctuating body weight,  current  Body mass index is 29.41 kg/m. , no fatigue, no subjective hyperthermia, no subjective hypothermia Eyes: no blurry vision, no xerophthalmia ENT: no sore throat, no nodules palpated in throat, no dysphagia/odynophagia, no hoarseness, + hard of hearing. Cardiovascular: no Chest Pain, no Shortness of Breath, no palpitations, no leg swelling Respiratory: no cough, no shortness of breath Gastrointestinal: no Nausea/Vomiting/Diarhhea Musculoskeletal: no muscle/joint aches Skin: no rashes, no hyperemia Neurological: no tremors, no numbness, no tingling, no dizziness Psychiatric: no depression, no anxiety     Objective:    BP (!) 152/74   Pulse 70   Ht '5\' 10"'$  (1.778 m)   Wt 205 lb (93 kg)   BMI 29.41 kg/m   Wt Readings from Last 3 Encounters:  06/26/19 205 lb (93 kg)  05/16/19 203 lb (92.1 kg)  12/25/18 204 lb (92.5 kg)    Physical Exam- Limited  Constitutional:  Body mass index is 29.41 kg/m. , not in acute distress, normal state of mind Eyes:  EOMI, no exophthalmos Neck: Supple Thyroid: No gross goiter Respiratory: Adequate breathing efforts Musculoskeletal: no gross deformities, strength intact in all four extremities, no gross restriction of joint movements Skin:  no rashes, no hyperemia Neurological: no tremor with outstretched hands, + hard of hearing   CMP     Component Value Date/Time   NA 134 (L) 05/16/2019 0819   K 4.1 05/16/2019 0819   CL 103 05/16/2019 0819   CO2 21 (L) 05/16/2019 0819   GLUCOSE 222 (H)  05/16/2019 0819   BUN 28 (H) 05/16/2019 0819   CREATININE 1.33 (H) 05/16/2019 0819   CREATININE 1.32 (H) 01/14/2019 0813   CALCIUM 9.1 05/16/2019 0819   PROT 7.4 05/16/2019 0819  ALBUMIN 4.0 05/16/2019 0819   AST 17 05/16/2019 0819   ALT 15 05/16/2019 0819   ALKPHOS 50 05/16/2019 0819   BILITOT 0.5 05/16/2019 0819   GFRNONAA 51 (L) 05/16/2019 0819   GFRNONAA 51 (L) 01/14/2019 0813   GFRAA 59 (L) 05/16/2019 0819   GFRAA 59 (L) 01/14/2019 0813     Diabetic Labs (most recent): Lab Results  Component Value Date   HGBA1C 9.1 (A) 06/26/2019   HGBA1C 8.8 (H) 01/14/2019   HGBA1C 8.1 (H) 09/30/2018     Lipid Panel ( most recent) Lipid Panel     Component Value Date/Time   CHOL 146 09/30/2018 0918   TRIG 103 09/30/2018 0918   HDL 49 09/30/2018 0918   CHOLHDL 3.0 09/30/2018 0918   VLDL 25.0 12/21/2017 1053   LDLCALC 78 09/30/2018 0918      Lab Results  Component Value Date   TSH 3.05 01/14/2019   TSH 4.80 (H) 09/30/2018   TSH 3.27 12/21/2017   TSH 2.60 06/11/2017   TSH 2.36 05/22/2016   TSH 2.18 06/16/2015   TSH 2.370 12/19/2011   FREET4 1.2 01/14/2019   FREET4 0.9 09/30/2018   FREET4 0.92 06/11/2017   FREET4 0.95 05/22/2016   FREET4 0.92 06/16/2015      Assessment & Plan:   1. Uncontrolled type 2 diabetes mellitus with hyperglycemia ([place])  - Jonathan Hensley has currently uncontrolled symptomatic type 2 DM since  79 years of age. His presents with slightly above target glycemic profile was fasting and postprandial.  He discontinued his Metformin.  He did have COVID-19 infection in January, recovered without needing hospital care.  His point-of-care A1c today is 9.1%.  -his diabetes is complicated by retinopathy and he remains at a high risk for more acute and chronic complications which include CAD, CVA, CKD, retinopathy, and neuropathy. These are all discussed in detail with him.  - I have counseled him on diet management and weight loss, by adopting a  carbohydrate restricted/protein rich diet.  - he  admits there is a room for improvement in his diet and drink choices. -  Suggestion is made for him to avoid simple carbohydrates  from his diet including Cakes, Sweet Desserts / Pastries, Ice Cream, Soda (diet and regular), Sweet Tea, Candies, Chips, Cookies, Sweet Pastries,  Store Bought Juices, Alcohol in Excess of  1-2 drinks a day, Artificial Sweeteners, Coffee Creamer, and "Sugar-free" Products. This will help patient to have stable blood glucose profile and potentially avoid unintended weight gain.   - I encouraged him to switch to  unprocessed or minimally processed complex starch and increased protein intake (animal or plant source), fruits, and vegetables.  - he is advised to stick to a routine mealtimes to eat 3 meals  a day and avoid unnecessary snacks ( to snack only to correct hypoglycemia).    - I have approached him with the following individualized plan to manage diabetes and patient agrees:    -Given  his current glycemic burden, he will continue to need insulin treatment in order for him to achieve control of diabetes to target.  -He is already benefited from simplified treatment approach.  -I discussed and increase his Lantus to 36 units nightly,  associated with monitoring of blood glucose twice a day-daily before breakfast and at bedtime.  -He discontinued his Metformin on his own.  He will benefit from low-dose glipizide therapy.  I discussed and added glipizide 5 mg XL p.o. daily at breakfast.    -  he will be considered for incretin therapy as appropriate next visit.  - Patient specific target  A1c;  LDL, HDL, Triglycerides, and  Waist Circumference were discussed in detail.  2) Blood Pressure /Hypertension: His blood pressure is slightly above target.  he is advised to continue his current medications including benazepril 40 mg p.o. daily with breakfast.    3) Lipids/Hyperlipidemia:   Review of his recent lipid  panel showed  controlled  LDL at 53 .  he  is advised to continue Zocor 40 mg p.o. daily at bedtime, as well as Zetia 10 mg p.o. nightly.   Side effects and precautions discussed with him.  4)  Weight/Diet: His BMI is 29.1.  He is a candidate for modest weight loss.  I discussed with him the fact that loss of 5 - 10% of his  current body weight will have the most impact on his diabetes management.  CDE Consult will be initiated . Exercise, and detailed carbohydrates information provided  -  detailed on discharge instructions.   5) Hypothyroidism:   -He tolerates Synthroid for hypothyroidism, he is advised to continue Synthroid 25 mcg p.o. daily before breakfast.     - We discussed about the correct intake of his thyroid hormone, on empty stomach at fasting, with water, separated by at least 30 minutes from breakfast and other medications,  and separated by more than 4 hours from calcium, iron, multivitamins, acid reflux medications (PPIs). -Patient is made aware of the fact that thyroid hormone replacement is needed for life, dose to be adjusted by periodic monitoring of thyroid function tests.   6) Chronic Care/Health Maintenance:  -he  is on ACEI/ARB and Statin medications and  is encouraged to initiate and continue to follow up with Ophthalmology, Dentist,  Podiatrist at least yearly or according to recommendations, and advised to   stay away from smoking. I have recommended yearly flu vaccine and pneumonia vaccine at least every 5 years; moderate intensity exercise for up to 150 minutes weekly; and  sleep for at least 7 hours a day.  - he is  advised to maintain close follow up with Arion Shankles, Marella Chimes, MD for primary care needs, as well as his other providers for optimal and coordinated care.  - Time spent on this patient care encounter:  35 min, of which > 50% was spent in  counseling and the rest reviewing his blood glucose logs , discussing his hypoglycemia and hyperglycemia episodes,  reviewing his current and  previous labs / studies  ( including abstraction from other facilities) and medications  doses and developing a  long term treatment plan and documenting his care.   Please refer to Patient Instructions for Blood Glucose Monitoring and Insulin/Medications Dosing Guide"  in media tab for additional information. Please  also refer to " Patient Self Inventory" in the Media  tab for reviewed elements of pertinent patient history.  Jonathan Hensley participated in the discussions, expressed understanding, and voiced agreement with the above plans.  All questions were answered to his satisfaction. he is encouraged to contact clinic should he have any questions or concerns prior to his return visit.   Follow up plan: - Return in about 3 months (around 09/23/2019) for Bring Meter and Logs- A1c in Office, Follow up with Pre-visit Labs.  Glade Lloyd, MD [facility] [address] [place],  [address] Phone: [phone]  Fax: [phone]    06/26/2019, 12:00 PM  This note was  partially dictated with voice recognition software. Similar sounding words can be transcribed inadequately or may not  be corrected upon review.

## 2019-06-26 NOTE — Patient Instructions (Signed)

## 2019-06-30 ENCOUNTER — Other Ambulatory Visit: Payer: Self-pay | Admitting: "Endocrinology

## 2019-07-28 ENCOUNTER — Telehealth: Payer: Self-pay | Admitting: "Endocrinology

## 2019-07-28 DIAGNOSIS — E1165 Type 2 diabetes mellitus with hyperglycemia: Secondary | ICD-10-CM

## 2019-07-28 MED ORDER — BD PEN NEEDLE SHORT U/F 31G X 8 MM MISC: 1.0000 | 1 refills | Status: DC

## 2019-07-28 NOTE — Telephone Encounter (Signed)
Rx refill sent.

## 2019-07-28 NOTE — Telephone Encounter (Signed)
Pt needs refill on Insulin Pen Needle (B-D ULTRAFINE III SHORT PEN) 31G X 8 MM MISC. [employer]

## 2019-07-29 ENCOUNTER — Ambulatory Visit (INDEPENDENT_AMBULATORY_CARE_PROVIDER_SITE_OTHER): Payer: Medicare Other | Admitting: Diagnostic Neuroimaging

## 2019-07-29 ENCOUNTER — Other Ambulatory Visit: Payer: Self-pay

## 2019-07-29 ENCOUNTER — Encounter: Payer: Self-pay | Admitting: Diagnostic Neuroimaging

## 2019-07-29 VITALS — BP 136/72 | HR 70 | Temp 96.8°F | Ht 70.0 in | Wt 209.0 lb

## 2019-07-29 DIAGNOSIS — G3101 Pick's disease: Secondary | ICD-10-CM | POA: Diagnosis not present

## 2019-07-29 DIAGNOSIS — F0280 Dementia in other diseases classified elsewhere without behavioral disturbance: Secondary | ICD-10-CM

## 2019-07-29 MED ORDER — MEMANTINE HCL 10 MG PO TABS: 10.0000 mg | ORAL_TABLET | Freq: Two times a day (BID) | ORAL | 4 refills | Status: DC

## 2019-07-29 NOTE — Progress Notes (Signed)
[facility]  PATIENT: Jonathan Hensley DOB: Mar 23, 1941  REFERRING CLINICIAN: Eduard Clos, MD HISTORY FROM: patient and wife and chart review  REASON FOR VISIT: new consult    HISTORICAL  CHIEF COMPLAINT:  Chief Complaint  Patient presents with  . Word finding difficulty    rm 6, wifePamala Hurry  MMSE 12    HISTORY OF PRESENT ILLNESS:   UPDATE (07/29/19, VRP): Since last visit, symptoms have progressed. Significant language diff and word finding difficulty. Hearing and vision declining as well. Able to maintain personal hygiene, personal ADLs. Needs help outside of home. Wife goes most places with him.    PRIOR HPI (05/23/17): 79 year old male here for evaluation of memory disorder.  Past 1-2 years patient has had mild difficulty with forgetting tasks, shopping items, certain words, and recent conversations.  These are mild problems and not affecting his day-to-day life.  He is able to drive, shop, take care of personal needs, and all of his activities of daily living.  Patient was evaluated last year for these problems and MOCA  testing was 28 out of 30.  Patient was offered donepezil empirically to see if this may help in case this was the early onset of dementia.  Upon going to the pharmacy and finding out about side effects of this medication patient declined to start the medication.  In addition they requested second opinion evaluation.  Patient has long history of motion sickness, dizziness, spinning sensation since childhood.  Patient still feels traumatized by these dizzy attacks throughout his life.  These were exacerbated in the past by other medications which aggravated dizziness.  Therefore patient is very reluctant about starting new medications which have dizziness as listed as a side effect.  Patient was offered to go through neuropsychological testing but has declined thus far.    REVIEW OF SYSTEMS: Full 14 system review of systems performed and negative with  exception of: as per HPI.  ALLERGIES: Allergies  Allergen Reactions  . Beta Adrenergic Blockers Other (See Comments)    Severe dizziness  . Other Other (See Comments)    Beta or alpha blockers: severe dizziness  . Tramadol Other (See Comments)    Dizziness     HOME MEDICATIONS: Outpatient Medications Prior to Visit  Medication Sig Dispense Refill  . Blood Glucose Monitoring Suppl (FREESTYLE FREEDOM LITE) w/Device KIT 1 each by Does not apply route 4 (four) times daily. USE BLOOD GLUCOSE MONITOR TO CHECK BLOOD SUGAR. 1 each 0  . diltiazem (CARDIZEM CD) 120 MG 24 hr capsule Take 1 capsule (120 mg total) by mouth daily. 30 capsule 0  . ELIQUIS 5 MG TABS tablet TAKE 1 TABLET TWICE A DAY 180 tablet 3  . ezetimibe (ZETIA) 10 MG tablet TAKE 1 TABLET EVERY EVENING AFTER DINNER 90 tablet 1  . FREESTYLE LITE test strip USE TO CHECK BLOOD SUGAR FOUR TIMES A DAY AS INSTRUCTED 400 each 1  . glipiZIDE (GLUCOTROL XL) 5 MG 24 hr tablet Take 1 tablet (5 mg total) by mouth daily with breakfast. 30 tablet 3  . Insulin Glargine (LANTUS SOLOSTAR) 100 UNIT/ML Solostar Pen Inject 36 Units into the skin daily. 45 mL 0  . Insulin Pen Needle (B-D ULTRAFINE III SHORT PEN) 31G X 8 MM MISC 1 each by Does not apply route as directed. 100 each 1  . Lancets (FREESTYLE) lancets Use as instructed to check blood sugar 4 times per day dx code 250.00 400 each 3  . lidocaine (LIDODERM) 5 %  Place 1 patch onto the skin daily. Remove & Discard patch within 12 hours or as directed by MD 30 patch 0  . simvastatin (ZOCOR) 20 MG tablet TAKE 1 TABLET EVERY EVENING AFTER DINNER 90 tablet 1  . SYNTHROID 25 MCG tablet Take 1 tablet (25 mcg total) by mouth daily before breakfast. 90 tablet 1  . Insulin Glargine, 2 Unit Dial, (TOUJEO MAX SOLOSTAR) 300 UNIT/ML SOPN Inject 36 Units into the skin at bedtime.     No facility-administered medications prior to visit.    PAST MEDICAL HISTORY: Past Medical History:  Diagnosis Date  .  Arthritis    rheumatoid in his hands  . Atrial fibrillation ([place])   . Bulging lumbar disc   . Cancer ([place])   . DDD (degenerative disc disease), cervical   . Diabetes mellitus   . Diabetic retinopathy ([place])   . Double vision   . Dysrhythmia    Atrial Fibrillation  . History of prostate cancer   . Hypertension   . Low back pain   . Nephrolithiasis   . Sleep apnea    uses cpap    PAST SURGICAL HISTORY: Past Surgical History:  Procedure Laterality Date  . APPENDECTOMY    . BACK SURGERY    . Bilateral L4-5 lumbar decompression including bilateral  11/2008  . brachytherapy    . COLONOSCOPY    . CYSTOSCOPY    . Left L4-L5 lumbar laminotomy and microdiskectomy with  07/14/2008    . LITHOTRIPSY    . LUMBAR LAMINECTOMY/DECOMPRESSION MICRODISCECTOMY Left 08/18/2013   Procedure: Left Lumbar Five-Sacral One Microdiskectomy;  Surgeon: [name], MD;  Location: [facility];  Service: Neurosurgery;  Laterality: Left;  Left Lumbar Five-Sacral One Microdiskectomy  . rotator cuff surgery    . Scleral buckle, laser photocoagulation, and anterior chamber  01/04/2009    FAMILY HISTORY: Family History  Problem Relation Age of Onset  . Heart failure Father   . Heart failure Mother   . Heart failure Brother        Half brother.  . Colon cancer Neg Hx     SOCIAL HISTORY:  Social History   Socioeconomic History  . Marital status: Married    Spouse name: [name]  . Number of children: 2  . Years of education: 16  . Highest education level: Not on file  Occupational History  . Not on file  Tobacco Use  . Smoking status: Former Smoker    Quit date: 02/12/1973    Years since quitting: 46.4  . Smokeless tobacco: Never Used  . Tobacco comment: starting at age 25  Substance and Sexual Activity  . Alcohol use: No  . Drug use: No  . Sexual activity: Yes  Other Topics Concern  . Not on file  Social History Narrative   Lives at home with wife   Drinks 4 cups of coffee a day    Social Determinants of Health   Financial Resource Strain:   . Difficulty of Paying Living Expenses:   Food Insecurity:   . Worried About Charity fundraiser in the Last Year:   . Arboriculturist in the Last Year:   Transportation Needs:   . Film/video editor (Medical):   Marland Kitchen Lack of Transportation (Non-Medical):   Physical Activity:   . Days of Exercise per Week:   . Minutes of Exercise per Session:   Stress:   . Feeling of Stress :   Social Connections:   .  Frequency of Communication with Friends and Family:   . Frequency of Social Gatherings with Friends and Family:   . Attends Religious Services:   . Active Member of Clubs or Organizations:   . Attends Archivist Meetings:   Marland Kitchen Marital Status:   Intimate Partner Violence:   . Fear of Current or Ex-Partner:   . Emotionally Abused:   Marland Kitchen Physically Abused:   . Sexually Abused:      PHYSICAL EXAM  GENERAL EXAM/CONSTITUTIONAL: Vitals:  Vitals:   07/29/19 1044  BP: 136/72  Pulse: 70  Temp: (!) 96.8 F (36 C)  Weight: 209 lb (94.8 kg)  Height: _0  (1.778 m)   Body mass index is 29.99 kg/m. No exam data present  Patient is in no distress; well developed, nourished and groomed; neck is supple  CARDIOVASCULAR:  Examination of carotid arteries is normal; no carotid bruits  Regular rate and rhythm, no murmurs  Examination of peripheral vascular system by observation and palpation is normal  EYES:  Ophthalmoscopic exam of optic discs and posterior segments is normal; no papilledema or hemorrhages  MUSCULOSKELETAL:  Gait, strength, tone, movements noted in Neurologic exam below  NEUROLOGIC: MENTAL STATUS:  MMSE - [place] Exam 07/29/2019 05/23/2017  Orientation to time 3 5  Orientation to Place 0 4  Registration 1 3  Attention/ Calculation 0 4  Recall 1 3  Language- name 2 objects 2 2  Language- repeat 0 1  Language- follow 3 step command 3 3  Language- read & follow direction  1 1  Write a sentence 0 1  Copy design 1 1  Total score 12 28    awake, alert, oriented to person  DECR memory   DECR attention and concentration  SIGNIFICANT DECR FLUENCY; DECR comprehension; SIG IMPAIRED NAMING; able to repeat  fund of knowledge appropriate  CRANIAL NERVE:   2nd - no papilledema on fundoscopic exam  2nd, 3rd, 4th, 6th - pupils equal and reactive to light, visual fields full to confrontation, extraocular muscles intact, no nystagmus  5th - facial sensation symmetric  7th - facial strength symmetric  8th - hearing intact  9th - palate elevates symmetrically, uvula midline  11th - shoulder shrug symmetric  12th - tongue protrusion midline  MOTOR:   normal bulk and tone, full strength in the BUE, BLE  SENSORY:   normal and symmetric to light touch, temperature, vibration  COORDINATION:   finger-nose-finger, fine finger movements normal  REFLEXES:   deep tendon reflexes present and symmetric  GAIT/STATION:   narrow based gait    DIAGNOSTIC DATA (LABS, IMAGING, TESTING) - I reviewed patient records, labs, notes, testing and imaging myself where available.  Lab Results  Component Value Date   WBC 3.9 (L) 05/16/2019   HGB 12.9 (L) 05/16/2019   HCT 38.9 (L) 05/16/2019   MCV 93.3 05/16/2019   PLT 208 05/16/2019      Component Value Date/Time   NA 134 (L) 05/16/2019 0819   K 4.1 05/16/2019 0819   CL 103 05/16/2019 0819   CO2 21 (L) 05/16/2019 0819   GLUCOSE 222 (H) 05/16/2019 0819   BUN 28 (H) 05/16/2019 0819   CREATININE 1.33 (H) 05/16/2019 0819   CREATININE 1.32 (H) 01/14/2019 0813   CALCIUM 9.1 05/16/2019 0819   PROT 7.4 05/16/2019 0819   ALBUMIN 4.0 05/16/2019 0819   AST 17 05/16/2019 0819   ALT 15 05/16/2019 0819   ALKPHOS 50 05/16/2019 0819   BILITOT 0.5  05/16/2019 0819   GFRNONAA 51 (L) 05/16/2019 0819   GFRNONAA 51 (L) 01/14/2019 0813   GFRAA 59 (L) 05/16/2019 0819   GFRAA 59 (L) 01/14/2019 0813   Lab Results   Component Value Date   CHOL 146 09/30/2018   HDL 49 09/30/2018   LDLCALC 78 09/30/2018   TRIG 103 09/30/2018   CHOLHDL 3.0 09/30/2018   Lab Results  Component Value Date   HGBA1C 9.1 (A) 06/26/2019   Lab Results  Component Value Date   PTCKFWBL10 289 09/20/2017   Lab Results  Component Value Date   TSH 3.05 01/14/2019    09/22/14 MRI brain and IAC 1. Mild perisylvian and moderate mesial temporal atrophy. 2. Mild periventricular and subcortical foci of chronic small vessel ischemic disease. 3. Thin cut views of internal auditory canals, semicircular canals, brainstem structures are normal. 4. No acute findings.  09/22/14 MRI orbits - Unremarkable MRI orbits (with and without). Right scleral banding noted. No abnormal enhancing, inflammatory or compressive lesions are seen.  07/17/16 MRI brain 1. Stable atrophy and moderate diffuse white matter disease. This likely reflects the sequela of chronic microvascular ischemia.  2. Remote lacunar infarcts of the basal ganglia bilaterally are stable.     ASSESSMENT AND PLAN  79 y.o. year old male here with progressive language and memory difficulty since ~2016. MMSE 12/30. ADLs slightly affected.   Dx:   1. Primary progressive aphasia ([place])     PLAN:  MODERATE DEMENTIA (suspect primary progressive aphasia) - start memantine 6m at bedtime; increase to twice a day after 1-2 weeks - safety / supervision issues reviewed - caregiver resources provided - caution with driving and finances - encouraged brain healthy activities  Meds ordered this encounter  Medications  . memantine (NAMENDA) 10 MG tablet    Sig: Take 1 tablet (10 mg total) by mouth 2 (two) times daily.    Dispense:  180 tablet    Refill:  4   Return for pending if symptoms worsen or fail to improve, return to PCP.    VPenni Bombard MD 30/22/8406 198:[id number] Certified in Neurology, Neurophysiology and Neuroimaging  [facility] [address] [place] [phone]

## 2019-07-29 NOTE — Patient Instructions (Signed)
-   start memantine 10mg  at bedtime; increase to twice a day after 1-2 weeks - safety / supervision issues reviewed - caregiver resources provided - caution with driving and finances - encouraged brain healthy activities

## 2019-08-04 ENCOUNTER — Telehealth: Payer: Self-pay | Admitting: Diagnostic Neuroimaging

## 2019-08-04 NOTE — Telephone Encounter (Signed)
Pt wife called stating she needs RN to notify [facility] to stop refilling medication states they have contacted them and advised to cancel but they are needing the OK from provider to cancel remaining refills.

## 2019-08-04 NOTE — Telephone Encounter (Signed)
Called wife who stated that she and patient don't like side effects of dizziness and sleepiness. She stated they have stairs in their home, and he's still driving. She stated MD told them if he didn't like med they could stop it. She requested [facility] be notified med was d/c. I advised we can take care of this. Wife verbalized understanding, appreciation. Rx discontinued.

## 2019-08-06 ENCOUNTER — Ambulatory Visit: Payer: Medicare Other | Admitting: Nutrition

## 2019-08-11 MED ORDER — BD PEN NEEDLE SHORT U/F 31G X 8 MM MISC: 1.0000 | 1 refills | Status: DC

## 2019-08-11 NOTE — Telephone Encounter (Signed)
pts wife said these were suppose to go to [facility]. Pen  Needles

## 2019-08-11 NOTE — Addendum Note (Signed)
Addended by: Ellin Saba on: 08/11/2019 01:52 PM   Modules accepted: Orders

## 2019-08-11 NOTE — Telephone Encounter (Signed)
Rx refill resent to [facility].

## 2019-09-17 LAB — LIPID PANEL
Cholesterol: 198 mg/dL (ref <200)
HDL: 49 mg/dL (ref >=40)
LDL Cholesterol (Calc): 130 mg/dL (calc) — ABNORMAL HIGH
Non-HDL Cholesterol (Calc): 149 mg/dL (calc) — ABNORMAL HIGH (ref <130)
Total CHOL/HDL Ratio: 4 (calc) (ref <5.0)
Triglycerides: 86 mg/dL (ref <150)

## 2019-09-17 LAB — MICROALBUMIN / CREATININE URINE RATIO
Creatinine, Urine: 138 mg/dL (ref 20–320)
Microalb Creat Ratio: 3 mcg/mg creat (ref <30)
Microalb, Ur: 0.4 mg/dL

## 2019-09-17 LAB — COMPLETE METABOLIC PANEL WITH GFR
AG Ratio: 1.4 (calc) (ref 1.0–2.5)
ALT: 10 U/L (ref 9–46)
AST: 14 U/L (ref 10–35)
Albumin: 4 g/dL (ref 3.6–5.1)
Alkaline phosphatase (APISO): 43 U/L (ref 35–144)
BUN/Creatinine Ratio: 30 (calc) — ABNORMAL HIGH (ref 6–22)
BUN: 37 mg/dL — ABNORMAL HIGH (ref 7–25)
CO2: 28 mmol/L (ref 20–32)
Calcium: 9.7 mg/dL (ref 8.6–10.3)
Chloride: 106 mmol/L (ref 98–110)
Creat: 1.23 mg/dL — ABNORMAL HIGH (ref 0.70–1.18)
GFR, Est African American: 65 mL/min/{1.73_m2} (ref >=60)
GFR, Est Non African American: 56 mL/min/{1.73_m2} — ABNORMAL LOW (ref >=60)
Globulin: 2.8 g/dL (calc) (ref 1.9–3.7)
Glucose, Bld: 98 mg/dL (ref 65–99)
Potassium: 4.9 mmol/L (ref 3.5–5.3)
Sodium: 140 mmol/L (ref 135–146)
Total Bilirubin: 0.4 mg/dL (ref 0.2–1.2)
Total Protein: 6.8 g/dL (ref 6.1–8.1)

## 2019-09-17 LAB — VITAMIN D 25 HYDROXY (VIT D DEFICIENCY, FRACTURES): Vit D, 25-Hydroxy: 33 ng/mL (ref 30–100)

## 2019-09-17 LAB — T4, FREE: Free T4: 1 ng/dL (ref 0.8–1.8)

## 2019-09-17 LAB — TSH: TSH: 3.7 mIU/L (ref 0.40–4.50)

## 2019-09-24 ENCOUNTER — Other Ambulatory Visit: Payer: Self-pay

## 2019-09-24 ENCOUNTER — Ambulatory Visit (INDEPENDENT_AMBULATORY_CARE_PROVIDER_SITE_OTHER): Payer: Medicare Other | Admitting: "Endocrinology

## 2019-09-24 VITALS — BP 147/77 | HR 65 | Ht 70.0 in | Wt 214.6 lb

## 2019-09-24 DIAGNOSIS — E1165 Type 2 diabetes mellitus with hyperglycemia: Secondary | ICD-10-CM | POA: Diagnosis not present

## 2019-09-24 DIAGNOSIS — I10 Essential (primary) hypertension: Secondary | ICD-10-CM | POA: Diagnosis not present

## 2019-09-24 DIAGNOSIS — E782 Mixed hyperlipidemia: Secondary | ICD-10-CM | POA: Diagnosis not present

## 2019-09-24 DIAGNOSIS — E039 Hypothyroidism, unspecified: Secondary | ICD-10-CM | POA: Diagnosis not present

## 2019-09-24 LAB — POCT GLYCOSYLATED HEMOGLOBIN (HGB A1C): Hemoglobin A1C: 7.5 % — AB (ref 4.0–5.6)

## 2019-09-24 MED ORDER — LANTUS SOLOSTAR 100 UNIT/ML ~~LOC~~ SOPN: 36.0000 [IU] | PEN_INJECTOR | Freq: Every day | SUBCUTANEOUS | 1 refills | Status: DC

## 2019-09-24 MED ORDER — SYNTHROID 50 MCG PO TABS: 50.0000 ug | ORAL_TABLET | Freq: Every day | ORAL | 1 refills | Status: DC

## 2019-09-24 MED ORDER — SIMVASTATIN 20 MG PO TABS: ORAL_TABLET | ORAL | 1 refills | Status: DC

## 2019-09-24 MED ORDER — GLIPIZIDE ER 5 MG PO TB24: 5.0000 mg | ORAL_TABLET | Freq: Every day | ORAL | 1 refills | Status: DC

## 2019-09-24 NOTE — Patient Instructions (Signed)

## 2019-09-24 NOTE — Progress Notes (Signed)
09/24/2019, 1:03 PM     Endocrinology follow-up note    Subjective:    Patient ID: Jonathan Hensley, male    DOB: 22-Sep-1940.  Jonathan Hensley is being seen in follow-up for the  management of currently uncontrolled symptomatic diabetes. Previously seen by [name].  Past Medical History:  Diagnosis Date  . Arthritis    rheumatoid in his hands  . Atrial fibrillation ([place])   . Bulging lumbar disc   . Cancer ([place])   . DDD (degenerative disc disease), cervical   . Diabetes mellitus   . Diabetic retinopathy ([place])   . Double vision   . Dysrhythmia    Atrial Fibrillation  . History of prostate cancer   . Hypertension   . Low back pain   . Nephrolithiasis   . Sleep apnea    uses cpap    Past Surgical History:  Procedure Laterality Date  . APPENDECTOMY    . BACK SURGERY    . Bilateral L4-5 lumbar decompression including bilateral  11/2008  . brachytherapy    . COLONOSCOPY    . CYSTOSCOPY    . Left L4-L5 lumbar laminotomy and microdiskectomy with  07/14/2008    . LITHOTRIPSY    . LUMBAR LAMINECTOMY/DECOMPRESSION MICRODISCECTOMY Left 08/18/2013   Procedure: Left Lumbar Five-Sacral One Microdiskectomy;  Surgeon: [name], MD;  Location: [facility];  Service: Neurosurgery;  Laterality: Left;  Left Lumbar Five-Sacral One Microdiskectomy  . rotator cuff surgery    . Scleral buckle, laser photocoagulation, and anterior chamber  01/04/2009    Social History   Socioeconomic History  . Marital status: Married    Spouse name: [name]  . Number of children: 2  . Years of education: 67  . Highest education level: Not on file  Occupational History  . Not on file  Tobacco Use  . Smoking status: Former Smoker    Quit date: 02/12/1973    Years since quitting: 46.6  . Smokeless tobacco: Never Used  . Tobacco comment: starting at age 89  Substance and Sexual Activity  . Alcohol use:  No  . Drug use: No  . Sexual activity: Yes  Other Topics Concern  . Not on file  Social History Narrative   Lives at home with wife   Drinks 4 cups of coffee a day   Social Determinants of Health   Financial Resource Strain:   . Difficulty of Paying Living Expenses:   Food Insecurity:   . Worried About Charity fundraiser in the Last Year:   . Arboriculturist in the Last Year:   Transportation Needs:   . Film/video editor (Medical):   Marland Kitchen Lack of Transportation (Non-Medical):   Physical Activity:   . Days of Exercise per Week:   . Minutes of Exercise per Session:   Stress:   . Feeling of Stress :   Social Connections:   . Frequency of Communication with Friends and Family:   . Frequency of Social Gatherings with Friends and Family:   . Attends Religious Services:   . Active Member of  Clubs or Organizations:   . Attends Archivist Meetings:   Marland Kitchen Marital Status:     Family History  Problem Relation Age of Onset  . Heart failure Father   . Heart failure Mother   . Heart failure Brother        Half brother.  . Colon cancer Neg Hx     Outpatient Encounter Medications as of 09/24/2019  Medication Sig  . Blood Glucose Monitoring Suppl (FREESTYLE FREEDOM LITE) w/Device KIT 1 each by Does not apply route 4 (four) times daily. USE BLOOD GLUCOSE MONITOR TO CHECK BLOOD SUGAR.  Marland Kitchen diltiazem (CARDIZEM CD) 120 MG 24 hr capsule Take 1 capsule (120 mg total) by mouth daily.  Marland Kitchen ELIQUIS 5 MG TABS tablet TAKE 1 TABLET TWICE A DAY  . ezetimibe (ZETIA) 10 MG tablet TAKE 1 TABLET EVERY EVENING AFTER DINNER  . FREESTYLE LITE test strip USE TO CHECK BLOOD SUGAR FOUR TIMES A DAY AS INSTRUCTED  . glipiZIDE (GLUCOTROL XL) 5 MG 24 hr tablet Take 1 tablet (5 mg total) by mouth daily with breakfast.  . insulin glargine (LANTUS SOLOSTAR) 100 UNIT/ML Solostar Pen Inject 36 Units into the skin daily.  . Insulin Pen Needle (B-D ULTRAFINE III SHORT PEN) 31G X 8 MM MISC 1 each by Does not  apply route as directed.  . Lancets (FREESTYLE) lancets Use as instructed to check blood sugar 4 times per day dx code 250.00  . lidocaine (LIDODERM) 5 % Place 1 patch onto the skin daily. Remove & Discard patch within 12 hours or as directed by MD  . simvastatin (ZOCOR) 20 MG tablet TAKE 1 TABLET EVERY EVENING AFTER DINNER  . SYNTHROID 50 MCG tablet Take 1 tablet (50 mcg total) by mouth daily before breakfast.  . [DISCONTINUED] glipiZIDE (GLUCOTROL XL) 5 MG 24 hr tablet Take 1 tablet (5 mg total) by mouth daily with breakfast.  . [DISCONTINUED] Insulin Glargine (LANTUS SOLOSTAR) 100 UNIT/ML Solostar Pen Inject 36 Units into the skin daily.  . [DISCONTINUED] simvastatin (ZOCOR) 20 MG tablet TAKE 1 TABLET EVERY EVENING AFTER DINNER  . [DISCONTINUED] SYNTHROID 25 MCG tablet Take 1 tablet (25 mcg total) by mouth daily before breakfast.   No facility-administered encounter medications on file as of 09/24/2019.    ALLERGIES: Allergies  Allergen Reactions  . Beta Adrenergic Blockers Other (See Comments)    Severe dizziness  . Other Other (See Comments)    Beta or alpha blockers: severe dizziness  . Tramadol Other (See Comments)    Dizziness     VACCINATION STATUS: Immunization History  Administered Date(s) Administered  . Influenza Split 02/12/2013  . Influenza, High Dose Seasonal PF 03/26/2018  . Influenza,inj,Quad PF,6+ Mos 02/26/2015  . Influenza-Unspecified 02/17/2014  . Pneumococcal Conjugate-13 11/26/2014  . Tdap 07/21/2012    Diabetes He presents for his follow-up diabetic visit. He has type 2 diabetes mellitus. Onset time: He was diagnosed at approximate age of 16 years. His disease course has been improving. There are no hypoglycemic associated symptoms. Pertinent negatives for hypoglycemia include no confusion, headaches, pallor or seizures. Pertinent negatives for diabetes include no chest pain, no fatigue, no polydipsia, no polyphagia, no polyuria and no weakness. There are  no hypoglycemic complications. Symptoms are improving. Diabetic complications include nephropathy and retinopathy. Risk factors for coronary artery disease include dyslipidemia, family history, male sex, sedentary lifestyle and tobacco exposure. Current diabetic treatment includes insulin injections and oral agent (monotherapy) (He is currently on Lantus 36 units nightly, glipizide 5  mg daily. ). He is compliant with treatment most of the time. His weight is increasing steadily. He is following a generally unhealthy diet. When asked about meal planning, he reported none. He has not had a previous visit with a dietitian. He participates in exercise intermittently. His home blood glucose trend is decreasing steadily. His breakfast blood glucose range is generally 130-140 mg/dl. His bedtime blood glucose range is generally 180-200 mg/dl. His overall blood glucose range is 140-180 mg/dl. (His presents with significantly improved glycemic profile both fasting and postprandial.  His point-of-care A1c today 7.5% improving from 9.1%.  He did not document or report major hypoglycemia.   ) An ACE inhibitor/angiotensin II receptor blocker is being taken. Eye exam is current.  Hyperlipidemia This is a chronic problem. The current episode started more than 1 year ago. The problem is controlled. Exacerbating diseases include diabetes and obesity. Pertinent negatives include no chest pain, myalgias or shortness of breath. Current antihyperlipidemic treatment includes statins. Risk factors for coronary artery disease include diabetes mellitus, dyslipidemia, hypertension, male sex and a sedentary lifestyle.  Hypertension This is a chronic problem. The current episode started more than 1 year ago. Pertinent negatives include no chest pain, headaches, neck pain, palpitations or shortness of breath. Risk factors for coronary artery disease include dyslipidemia, diabetes mellitus, male gender, obesity, sedentary lifestyle and  smoking/tobacco exposure. Past treatments include ACE inhibitors. Hypertensive end-organ damage includes retinopathy.     Review of systems  Constitutional: + Minimally fluctuating body weight,  current  Body mass index is 30.79 kg/m. , no fatigue, no subjective hyperthermia, no subjective hypothermia Eyes: no blurry vision, no xerophthalmia ENT: no sore throat, no nodules palpated in throat, no dysphagia/odynophagia, no hoarseness Cardiovascular: no Chest Pain, no Shortness of Breath, no palpitations, no leg swelling Respiratory: no cough, no shortness of breath Gastrointestinal: no Nausea/Vomiting/Diarhhea Musculoskeletal: no muscle/joint aches Skin: no rashes, no hyperemia Neurological: no tremors, no numbness, no tingling, no dizziness Psychiatric: no depression, no anxiety    Objective:    BP (!) 147/77   Pulse 65   Ht _0  (1.778 m)   Wt 214 lb 9.6 oz (97.3 kg)   BMI 30.79 kg/m   Wt Readings from Last 3 Encounters:  09/24/19 214 lb 9.6 oz (97.3 kg)  07/29/19 209 lb (94.8 kg)  06/26/19 205 lb (93 kg)    Physical Exam- Limited  Constitutional:  Body mass index is 30.79 kg/m. , not in acute distress, normal state of mind Eyes:  EOMI, no exophthalmos Neck: Supple Thyroid: No gross goiter Respiratory: Adequate breathing efforts Musculoskeletal: no gross deformities, strength intact in all four extremities, no gross restriction of joint movements Skin:  no rashes, no hyperemia Neurological: no tremor with outstretched hands,    CMP     Component Value Date/Time   NA 140 09/16/2019 0809   K 4.9 09/16/2019 0809   CL 106 09/16/2019 0809   CO2 28 09/16/2019 0809   GLUCOSE 98 09/16/2019 0809   BUN 37 (H) 09/16/2019 0809   CREATININE 1.23 (H) 09/16/2019 0809   CALCIUM 9.7 09/16/2019 0809   PROT 6.8 09/16/2019 0809   ALBUMIN 4.0 05/16/2019 0819   AST 14 09/16/2019 0809   ALT 10 09/16/2019 0809   ALKPHOS 50 05/16/2019 0819   BILITOT 0.4 09/16/2019 0809    GFRNONAA 56 (L) 09/16/2019 0809   GFRAA 65 09/16/2019 0809     Diabetic Labs (most recent): Lab Results  Component Value Date   HGBA1C 7.5 (  A) 09/24/2019   HGBA1C 9.1 (A) 06/26/2019   HGBA1C 8.8 (H) 01/14/2019     Lipid Panel ( most recent) Lipid Panel     Component Value Date/Time   CHOL 198 09/16/2019 0809   TRIG 86 09/16/2019 0809   HDL 49 09/16/2019 0809   CHOLHDL 4.0 09/16/2019 0809   VLDL 25.0 12/21/2017 1053   LDLCALC 130 (H) 09/16/2019 0809      Lab Results  Component Value Date   TSH 3.70 09/16/2019   TSH 3.05 01/14/2019   TSH 4.80 (H) 09/30/2018   TSH 3.27 12/21/2017   TSH 2.60 06/11/2017   TSH 2.36 05/22/2016   TSH 2.18 06/16/2015   TSH 2.370 12/19/2011   FREET4 1.0 09/16/2019   FREET4 1.2 01/14/2019   FREET4 0.9 09/30/2018   FREET4 0.92 06/11/2017   FREET4 0.95 05/22/2016   FREET4 0.92 06/16/2015      Assessment & Plan:   1. Uncontrolled type 2 diabetes mellitus with hyperglycemia ([place])  - Jonathan Hensley has currently uncontrolled symptomatic type 2 DM since  79 years of age. His presents with significantly improved glycemic profile both fasting and postprandial.  His point-of-care A1c today 7.5% improving from 9.1%.  He did not document or report major hypoglycemia.  Target A1c for him is 7 and 7.5%.   -his diabetes is complicated by retinopathy and he remains at a high risk for more acute and chronic complications which include CAD, CVA, CKD, retinopathy, and neuropathy. These are all discussed in detail with him.  - I have counseled him on diet management and weight loss, by adopting a carbohydrate restricted/protein rich diet.  - he  admits there is a room for improvement in his diet and drink choices. -  Suggestion is made for him to avoid simple carbohydrates  from his diet including Cakes, Sweet Desserts / Pastries, Ice Cream, Soda (diet and regular), Sweet Tea, Candies, Chips, Cookies, Sweet Pastries,  Store Bought Juices, Alcohol in  Excess of  1-2 drinks a day, Artificial Sweeteners, Coffee Creamer, and "Sugar-free" Products. This will help patient to have stable blood glucose profile and potentially avoid unintended weight gain.   - I encouraged him to switch to  unprocessed or minimally processed complex starch and increased protein intake (animal or plant source), fruits, and vegetables.  - he is advised to stick to a routine mealtimes to eat 3 meals  a day and avoid unnecessary snacks ( to snack only to correct hypoglycemia).    - I have approached him with the following individualized plan to manage diabetes and patient agrees:    -Given  his presentation with controlled glycemic profile, he will not need prandial insulin for now.    -He has benefited and will continue to benefit from long-acting insulin as a basal regimen.  He is advised continue Lantus 36 units nightly,   associated with monitoring of blood glucose twice a day-daily before breakfast and at bedtime.  -He has benefited from low-dose glipizide.  He is advised to continue  glipizide 5 mg XL p.o. daily at breakfast.    -He did not tolerate Metformin which she has stopped by his own. he will be considered for incretin therapy as appropriate next visit.  - Patient specific target  A1c;  LDL, HDL, Triglycerides, and  Waist Circumference were discussed in detail.  2) Blood Pressure /Hypertension: -His blood pressure slightly above target.  he is advised to continue his current medications including benazepril 40 mg p.o. daily  with breakfast.  3) Lipids/Hyperlipidemia:   Review of his recent lipid panel showed increased LDL at 130 from 53.  He is asked to resume and continue Zocor 40 mg p.o. daily at bedtime as well as his Zetia 10 mg p.o. nightly.    Side effects and precautions discussed with him.  4)  Weight/Diet: His BMI is 30.7.    He is a candidate for modest weight loss.  I discussed with him the fact that loss of 5 - 10% of his  current body  weight will have the most impact on his diabetes management.  CDE Consult will be initiated . Exercise, and detailed carbohydrates information provided  -  detailed on discharge instructions.   5) Hypothyroidism:   -He tolerates Synthroid for hypothyroidism. -He will benefit from increased dose of Synthroid.  I discussed and increase his Synthroid to 50 mcg daily before breakfast.   - We discussed about the correct intake of his thyroid hormone, on empty stomach at fasting, with water, separated by at least 30 minutes from breakfast and other medications,  and separated by more than 4 hours from calcium, iron, multivitamins, acid reflux medications (PPIs). -Patient is made aware of the fact that thyroid hormone replacement is needed for life, dose to be adjusted by periodic monitoring of thyroid function tests.   6) Chronic Care/Health Maintenance:  -he  is on ACEI/ARB and Statin medications and  is encouraged to initiate and continue to follow up with Ophthalmology, Dentist,  Podiatrist at least yearly or according to recommendations, and advised to   stay away from smoking. I have recommended yearly flu vaccine and pneumonia vaccine at least every 5 years; moderate intensity exercise for up to 150 minutes weekly; and  sleep for at least 7 hours a day.   - he is  advised to maintain close follow up with his PMD  for primary care needs, as well as his other providers for optimal and coordinated care.  - Time spent on this patient care encounter:  35 min, of which > 50% was spent in  counseling and the rest reviewing his blood glucose logs , discussing his hypoglycemia and hyperglycemia episodes, reviewing his current and  previous labs / studies  ( including abstraction from other facilities) and medications  doses and developing a  long term treatment plan and documenting his care.   Please refer to Patient Instructions for Blood Glucose Monitoring and Insulin/Medications Dosing Guide"  in media  tab for additional information. Please  also refer to " Patient Self Inventory" in the Media  tab for reviewed elements of pertinent patient history.  Jonathan Hensley participated in the discussions, expressed understanding, and voiced agreement with the above plans.  All questions were answered to his satisfaction. he is encouraged to contact clinic should he have any questions or concerns prior to his return visit.    Follow up plan: - Return in about 6 months (around 03/26/2020) for F/U with Pre-visit Labs, Meter, Logs, A1c here.Glade Lloyd, MD [facility] [address] [place], [place] [address] Phone: [phone]  Fax: [phone]    09/24/2019, 1:03 PM  This note was partially dictated with voice recognition software. Similar sounding words can be transcribed inadequately or may not  be corrected upon review.

## 2019-12-25 ENCOUNTER — Ambulatory Visit: Payer: TRICARE For Life (TFL) | Admitting: Cardiology

## 2020-01-05 ENCOUNTER — Other Ambulatory Visit: Payer: Self-pay

## 2020-01-05 DIAGNOSIS — E1165 Type 2 diabetes mellitus with hyperglycemia: Secondary | ICD-10-CM

## 2020-01-05 MED ORDER — LANTUS SOLOSTAR 100 UNIT/ML ~~LOC~~ SOPN: 36.0000 [IU] | PEN_INJECTOR | Freq: Every day | SUBCUTANEOUS | 0 refills | Status: DC

## 2020-01-09 ENCOUNTER — Ambulatory Visit: Payer: Medicare Other | Admitting: Cardiology

## 2020-01-09 ENCOUNTER — Other Ambulatory Visit: Payer: Self-pay

## 2020-01-09 ENCOUNTER — Encounter: Payer: Self-pay | Admitting: Cardiology

## 2020-01-09 VITALS — BP 153/84 | HR 96 | Resp 16 | Ht 70.0 in | Wt 216.0 lb

## 2020-01-09 DIAGNOSIS — I10 Essential (primary) hypertension: Secondary | ICD-10-CM

## 2020-01-09 DIAGNOSIS — I480 Paroxysmal atrial fibrillation: Secondary | ICD-10-CM

## 2020-01-09 DIAGNOSIS — E7800 Pure hypercholesterolemia, unspecified: Secondary | ICD-10-CM

## 2020-01-09 DIAGNOSIS — I4819 Other persistent atrial fibrillation: Secondary | ICD-10-CM

## 2020-01-09 DIAGNOSIS — N1831 Chronic kidney disease, stage 3a: Secondary | ICD-10-CM

## 2020-01-09 MED ORDER — DILTIAZEM HCL ER COATED BEADS 240 MG PO CP24: ORAL_CAPSULE | ORAL | 3 refills | Status: DC

## 2020-01-09 MED ORDER — ATORVASTATIN CALCIUM 10 MG PO TABS: 10.0000 mg | ORAL_TABLET | Freq: Every day | ORAL | 3 refills | Status: DC

## 2020-01-09 MED ORDER — BENAZEPRIL HCL 10 MG PO TABS: 10.0000 mg | ORAL_TABLET | Freq: Every evening | ORAL | 3 refills | Status: DC

## 2020-01-09 NOTE — Progress Notes (Signed)
Primary Physician:  No primary care provider on file.   Patient ID: Jonathan Hensley, male    DOB: 1940/07/15, 79 y.o.   MRN: [id number]  Subjective:    Chief Complaint  Patient presents with  . Atrial Fibrillation  . Follow-up    1 year    HPI: Jonathan Hensley  is a 79 y.o. male  with history of diabetes mellitus uncontrolled, hypertension and hyperlipidemia presents here for follow-up of paroxysmal atrial fibrillation. He has double vision and prism colors due to h/o retinal detachment (chronic) but has also developed diabetic retinopathy left eye, chronic vertigo. He has sleep apnea and uses CPAP on a regular basis.  He denies any chest pain, shortness of breath.  Has back issues that are stable with using a back brace, he has had 4 surgeries in past. He states he is otherwise going well. No palpitations, fatigue or dyspnea. Accompanied by his wife.  Past Medical History:  Diagnosis Date  . Arthritis    rheumatoid in his hands  . Atrial fibrillation ([place])   . Bulging lumbar disc   . Cancer ([place])   . DDD (degenerative disc disease), cervical   . Diabetes mellitus   . Diabetic retinopathy ([place])   . Double vision   . Dysrhythmia    Atrial Fibrillation  . History of prostate cancer   . Hypertension   . Low back pain   . Nephrolithiasis   . Sleep apnea    uses cpap    Past Surgical History:  Procedure Laterality Date  . APPENDECTOMY    . BACK SURGERY    . Bilateral L4-5 lumbar decompression including bilateral  11/2008  . brachytherapy    . COLONOSCOPY    . CYSTOSCOPY    . Left L4-L5 lumbar laminotomy and microdiskectomy with  07/14/2008    . LITHOTRIPSY    . LUMBAR LAMINECTOMY/DECOMPRESSION MICRODISCECTOMY Left 08/18/2013   Procedure: Left Lumbar Five-Sacral One Microdiskectomy;  Surgeon: [name], MD;  Location: [facility];  Service: Neurosurgery;  Laterality: Left;  Left Lumbar Five-Sacral One Microdiskectomy  . rotator cuff surgery    . Scleral buckle,  laser photocoagulation, and anterior chamber  01/04/2009   Social History   Tobacco Use  . Smoking status: Former Smoker    Quit date: 02/12/1973    Years since quitting: 46.9  . Smokeless tobacco: Never Used  . Tobacco comment: starting at age 53  Substance Use Topics  . Alcohol use: No   Marital Status: Married   Review of Systems  Cardiovascular: Negative for chest pain, dyspnea on exertion and leg swelling.  Musculoskeletal: Positive for arthritis and back pain.  Gastrointestinal: Negative for melena.  Psychiatric/Behavioral: Positive for memory loss.   Objective:  Blood pressure (!) 153/84, pulse 96, resp. rate 16, height _0  (1.778 m), weight 216 lb (98 kg), SpO2 98 %. Body mass index is 30.99 kg/m.  Vitals with BMI 01/09/2020 09/24/2019 07/29/2019  Height _1  _2  _3   Weight 216 lbs 214 lbs 10 oz 209 lbs  BMI 30.99 24.26 83.41  Systolic 962 229 798  Diastolic 84 77 72  Pulse 96 65 70    Physical Exam Constitutional:      General: He is not in acute distress.    Appearance: He is well-developed.  HENT:     Head: Atraumatic.  Eyes:     Conjunctiva/sclera: Conjunctivae normal.  Neck:     Thyroid: No thyromegaly.  Vascular: No JVD.  Cardiovascular:     Rate and Rhythm: Tachycardia present. Rhythm irregularly irregular.     Pulses: Normal pulses and intact distal pulses.     Heart sounds: No murmur heard.  No gallop. No S3 or S4 sounds.      Comments: S1 is variable, S2 is normal.  Pulmonary:     Effort: Pulmonary effort is normal.     Breath sounds: Normal breath sounds.  Abdominal:     General: Bowel sounds are normal.     Palpations: Abdomen is soft.  Musculoskeletal:        General: Normal range of motion.     Cervical back: Neck supple.  Skin:    General: Skin is warm and dry.  Neurological:     Mental Status: He is alert.    Radiology: No results found.  Laboratory examination:   CMP Latest Ref Rng & Units 09/16/2019 05/16/2019  01/14/2019  Glucose 65 - 99 mg/dL 98 222(H) 206(H)  BUN 7 - 25 mg/dL 37(H) 28(H) 39(H)  Creatinine 0.70 - 1.18 mg/dL 1.23(H) 1.33(H) 1.32(H)  Sodium 135 - 146 mmol/L 140 134(L) 138  Potassium 3.5 - 5.3 mmol/L 4.9 4.1 4.9  Chloride 98 - 110 mmol/L 106 103 104  CO2 20 - 32 mmol/L 28 21(L) 27  Calcium 8.6 - 10.3 mg/dL 9.7 9.1 9.5  Total Protein 6.1 - 8.1 g/dL 6.8 7.4 6.6  Total Bilirubin 0.2 - 1.2 mg/dL 0.4 0.5 0.3  Alkaline Phos 38 - 126 U/L - 50 -  AST 10 - 35 U/L _0 ALT 9 - 46 U/L _1 CBC Latest Ref Rng & Units 05/16/2019 06/18/2018 03/26/2018  WBC 4.0 - 10.5 K/uL 3.9(L) 6.4 7.5  Hemoglobin 13.0 - 17.0 g/dL 12.9(L) 12.0(L) 12.2(L)  Hematocrit 39 - 52 % 38.9(L) 36.2(L) 36.9(L)  Platelets 150 - 400 K/uL 208 219.0 249.0   Lipid Panel Recent Labs    09/16/19 0809  CHOL 198  TRIG 86  LDLCALC 130*  HDL 49  CHOLHDL 4.0    HEMOGLOBIN A1C Lab Results  Component Value Date   HGBA1C 7.5 (A) 09/24/2019   MPG 206 01/14/2019   TSH Recent Labs    01/14/19 0813 09/16/19 0809  TSH 3.05 3.70   Outpatient Medications Prior to Visit  Medication Sig Dispense Refill  . Blood Glucose Monitoring Suppl (FREESTYLE FREEDOM LITE) w/Device KIT 1 each by Does not apply route 4 (four) times daily. USE BLOOD GLUCOSE MONITOR TO CHECK BLOOD SUGAR. 1 each 0  . ELIQUIS 5 MG TABS tablet TAKE 1 TABLET TWICE A DAY 180 tablet 3  . ezetimibe (ZETIA) 10 MG tablet TAKE 1 TABLET EVERY EVENING AFTER DINNER 90 tablet 1  . FREESTYLE LITE test strip USE TO CHECK BLOOD SUGAR FOUR TIMES A DAY AS INSTRUCTED 400 each 1  . glipiZIDE (GLUCOTROL XL) 5 MG 24 hr tablet Take 1 tablet (5 mg total) by mouth daily with breakfast. 90 tablet 1  . Insulin Pen Needle (B-D ULTRAFINE III SHORT PEN) 31G X 8 MM MISC 1 each by Does not apply route as directed. 100 each 1  . Lancets (FREESTYLE) lancets Use as instructed to check blood sugar 4 times per day dx code 250.00 400 each 3  . diltiazem (CARDIZEM CD) 120 MG 24 hr  capsule Take 1 capsule (120 mg total) by mouth daily. 30 capsule 0  . insulin glargine (LANTUS SOLOSTAR) 100 UNIT/ML Solostar Pen Inject 36 Units  into the skin daily. 30 mL 0  . lidocaine (LIDODERM) 5 % Place 1 patch onto the skin daily. Remove & Discard patch within 12 hours or as directed by MD 30 patch 0  . simvastatin (ZOCOR) 20 MG tablet TAKE 1 TABLET EVERY EVENING AFTER DINNER 90 tablet 1  . SYNTHROID 50 MCG tablet Take 1 tablet (50 mcg total) by mouth daily before breakfast. 90 tablet 1   No facility-administered medications prior to visit.   Cardiac Studies:   Lexiscan stress 02/23/12: Normal perfusion. No ischemia. Patient in [facility] echo 12/20/11: Normal LVEF. Mild MV calcification. Normal LA size.  Holter Monitor 12/26/11: Brief A. fibrillation with RVR . NO symptoms reported. Paroxysmal epidoses. ED visit 06/14/2016: A. Fib with RVR.  EKG:  EKG 01/09/2020: Atrial fibrillation with rapid ventricular response at the rate of 120 bpm, normal axis, nonspecific T abnormality.  Compared to April 2020, heart rate was 98 bpm.  Patient previously was in atrial flutter.  Assessment:     ICD-10-CM   1. Persistent atrial fibrillation (HCC)  I48.19 EKG 12-Lead    diltiazem (CARDIZEM CD) 240 MG 24 hr capsule    PCV ECHOCARDIOGRAM COMPLETE  2. Essential hypertension  I10 benazepril (LOTENSIN) 10 MG tablet    PCV ECHOCARDIOGRAM COMPLETE  3. Hypercholesteremia  E78.00 atorvastatin (LIPITOR) 10 MG tablet    Lipid Panel With LDL/HDL Ratio    Lipid Panel With LDL/HDL Ratio  4. Paroxysmal atrial fibrillation (HCC)  I48.0   5. Stage 3a chronic kidney disease  N18.31    Medications Discontinued During This Encounter  Medication Reason  . lidocaine (LIDODERM) 5 % No longer needed (for PRN medications)  . insulin glargine (LANTUS SOLOSTAR) 100 UNIT/ML Solostar Pen No longer needed (for PRN medications)  . simvastatin (ZOCOR) 20 MG tablet Patient Preference  . SYNTHROID 50 MCG tablet  Discontinued by provider  . diltiazem (CARDIZEM CD) 120 MG 24 hr capsule     Meds ordered this encounter  Medications  . atorvastatin (LIPITOR) 10 MG tablet    Sig: Take 1 tablet (10 mg total) by mouth daily.    Dispense:  90 tablet    Refill:  3  . diltiazem (CARDIZEM CD) 240 MG 24 hr capsule    Sig: Daily    Dispense:  90 capsule    Refill:  3  . benazepril (LOTENSIN) 10 MG tablet    Sig: Take 1 tablet (10 mg total) by mouth every evening.    Dispense:  90 tablet    Refill:  3    Jonathan Hensley is 4.  Yearly risk of stroke: 4.8% (A, HTN, DM).  Hensley of 1=0.6; 2=2.2; 3=3.2; 4=4.8; 5=7.2; 6=9.8; 7=>9.8) -(CHF; HTN; vasc disease DM,  Male = 1; Age <65 =0; 65-74 = 1,  >75 =2; stroke/embolism= 2).    Recommendations:   Jonathan Hensley  is a 79 y.o. male  with history of diabetes mellitus uncontrolled, hypertension and hyperlipidemia presents here for follow-up of paroxysmal atrial fibrillation. He has double vision and prism colors due to h/o retinal detachment (chronic) but has also developed diabetic retinopathy left eye, chronic vertigo. He has sleep apnea and uses CPAP on a regular basis.  Today he is back in atrial fibrillation with rapid ventricular response.  He is also hypertensive.  Will increase diltiazem CD from 120 mg to 240 mg daily.  I would like to see him back in 2 weeks, if he is still in persistent atrial fibrillation may consider  direct-current cardioversion to maintain sinus rhythm.  I am beginning to wonder whether he has permanent atrial fibrillation in view of the fact that 6 months ago he had atrial fibrillation on EKG.  I will obtain an echocardiogram to evaluate his LV systolic function and also left atrial size.  With regard to lipids, he has discontinued simvastatin, will start him on atorvastatin 10 mg daily and check lipids in 6 weeks.  Hypertension is being managed by Lotensin and I refilled his also.  I will see him back for follow-up in 4 to 6 weeks.   Continue anticoagulation with Eliquis.  His labs reviewed, he has stable renal function and CBC has remained stable.  His wife is present at the bedside, all the questions regarding management of atrial fibrillation including rhythm control versus rate control, use of antiarrhythmic therapy discussed in detail.  I also discussed with him regarding bleeding complications with Eliquis, renal dysfunction and hypertension.  40-minute office visit encounter.   Adrian Prows, MD, [facility] 01/11/2020, 2:50 PM Office: [phone]

## 2020-01-12 ENCOUNTER — Telehealth: Payer: Self-pay | Admitting: "Endocrinology

## 2020-01-12 DIAGNOSIS — E1165 Type 2 diabetes mellitus with hyperglycemia: Secondary | ICD-10-CM

## 2020-01-12 MED ORDER — BD PEN NEEDLE SHORT U/F 31G X 8 MM MISC: 1.0000 | 1 refills | Status: DC

## 2020-01-12 NOTE — Telephone Encounter (Signed)
Pt requesting refill Insulin Pen Needle (B-D ULTRAFINE III SHORT PEN) 31G X 8 MM MISC and freestyle libre test strips. Please [facility]

## 2020-01-12 NOTE — Telephone Encounter (Signed)
Rx sent 

## 2020-01-14 ENCOUNTER — Encounter: Payer: Self-pay | Admitting: Cardiology

## 2020-01-14 ENCOUNTER — Other Ambulatory Visit: Payer: Self-pay

## 2020-01-14 ENCOUNTER — Ambulatory Visit: Payer: Medicare Other | Admitting: Cardiology

## 2020-01-14 VITALS — BP 139/90 | HR 96 | Resp 16 | Ht 70.0 in | Wt 214.4 lb

## 2020-01-14 DIAGNOSIS — I4819 Other persistent atrial fibrillation: Secondary | ICD-10-CM

## 2020-01-14 DIAGNOSIS — I10 Essential (primary) hypertension: Secondary | ICD-10-CM

## 2020-01-14 DIAGNOSIS — E7800 Pure hypercholesterolemia, unspecified: Secondary | ICD-10-CM

## 2020-01-14 NOTE — Progress Notes (Signed)
Primary Physician:  No primary care provider on file.   Patient ID: Jonathan Hensley, male    DOB: Oct 07, 1940, 80 y.o.   MRN: [id number]  Subjective:    Chief Complaint  Patient presents with  . Atrial Fibrillation  . Follow-up    Reconcile medications    HPI: Jonathan Hensley  is a 79 y.o. male  with history of diabetes mellitus uncontrolled, hypertension and hyperlipidemia presents here for follow-up of paroxysmal atrial fibrillation. He has double vision and prism colors due to h/o retinal detachment (chronic) but has also developed diabetic retinopathy left eye, chronic vertigo. He has sleep apnea and uses CPAP on a regular basis.  He denies any chest pain, shortness of breath.  Has back issues that are stable with using a back brace, he has had 4 surgeries in past. He states he is otherwise going well. No palpitations, fatigue or dyspnea. Accompanied by his wife.  At last visit on 01/09/2020 he was in atrial fibrillation with RVR. Increased diltiazem CD from 120 to 218m daily. Echo scheduled for 01/26/20. Started atorvastatin 157mdaily. Refilled benazepril for hypertension.  They wanted to come in to reconcile the medications.  Patient's wife states that he was never on diltiazem 120 mg p.o. discontinued long time ago and also benazepril was discontinued due to dizziness.  Otherwise remains asymptomatic with no significant change in symptoms since last office visit.   Past Medical History:  Diagnosis Date  . Arthritis    rheumatoid in his hands  . [place]  . Bulging lumbar disc   . [place]  . DDD (degenerative disc disease), cervical   . Diabetes mellitus   . Diabetic retinopathy ([place]  . Double vision   . Dysrhythmia    Atrial Fibrillation  . History of prostate cancer   . Hypertension   . Low back pain   . Nephrolithiasis   . Sleep apnea    uses cpap    Past Surgical History:  Procedure Laterality Date  . APPENDECTOMY    . BACK SURGERY      . Bilateral L4-5 lumbar decompression including bilateral  11/2008  . brachytherapy    . COLONOSCOPY    . CYSTOSCOPY    . Left L4-L5 lumbar laminotomy and microdiskectomy with  07/14/2008    . LITHOTRIPSY    . LUMBAR LAMINECTOMY/DECOMPRESSION MICRODISCECTOMY Left 08/18/2013   Procedure: Left Lumbar Five-Sacral One Microdiskectomy;  Surgeon: RoHosie SpangleMD;  [facility];  Service: Neurosurgery;  Laterality: Left;  Left Lumbar Five-Sacral One Microdiskectomy  . rotator cuff surgery    . Scleral buckle, laser photocoagulation, and anterior chamber  01/04/2009   Social History   Tobacco Use  . Smoking status: Former Smoker    Quit date: 02/12/1973    Years since quitting: 46.9  . Smokeless tobacco: Never Used  . Tobacco comment: starting at age 89Substance Use Topics  . Alcohol use: No   Marital Status: Married   Review of Systems  Cardiovascular: Negative for chest pain, dyspnea on exertion and leg swelling.  Musculoskeletal: Positive for arthritis and back pain.  Gastrointestinal: Negative for melena.  Psychiatric/Behavioral: Positive for memory loss.   Objective:  Blood pressure 139/90, pulse 96, resp. rate 16, height 5' 10"  (1.778 m), weight 214 lb 6.4 oz (97.3 kg). Body mass index is 30.76 kg/m.  Vitals with BMI 01/14/2020 01/09/2020 09/24/2019  Height 5' 10"  5' 10"  5' 10"   Weight  214 lbs 6 oz 216 lbs 214 lbs 10 oz  BMI 30.76 19.50 93.26  Systolic 712 458 099  Diastolic 90 84 77  Pulse 96 96 65    Physical Exam Constitutional:      General: He is not in acute distress.    Appearance: He is well-developed.  HENT:     Head: Atraumatic.  Eyes:     Conjunctiva/sclera: Conjunctivae normal.  Neck:     Thyroid: No thyromegaly.     Vascular: No JVD.  Cardiovascular:     Rate and Rhythm: Tachycardia present. Rhythm irregularly irregular.     Pulses: Normal pulses and intact distal pulses.     Heart sounds: No murmur heard.  No gallop. No S3 or S4 sounds.       Comments: S1 is variable, S2 is normal.  Pulmonary:     Effort: Pulmonary effort is normal.     Breath sounds: Normal breath sounds.  Abdominal:     General: Bowel sounds are normal.     Palpations: Abdomen is soft.  Musculoskeletal:        General: Normal range of motion.     Cervical back: Neck supple.  Skin:    General: Skin is warm and dry.  Neurological:     Mental Status: He is alert.    Radiology: No results found.  Laboratory examination:   CMP Latest Ref Rng & Units 09/16/2019 05/16/2019 01/14/2019  Glucose 65 - 99 mg/dL 98 222(H) 206(H)  BUN 7 - 25 mg/dL 37(H) 28(H) 39(H)  Creatinine 0.70 - 1.18 mg/dL 1.23(H) 1.33(H) 1.32(H)  Sodium 135 - 146 mmol/L 140 134(L) 138  Potassium 3.5 - 5.3 mmol/L 4.9 4.1 4.9  Chloride 98 - 110 mmol/L 106 103 104  CO2 20 - 32 mmol/L 28 21(L) 27  Calcium 8.6 - 10.3 mg/dL 9.7 9.1 9.5  Total Protein 6.1 - 8.1 g/dL 6.8 7.4 6.6  Total Bilirubin 0.2 - 1.2 mg/dL 0.4 0.5 0.3  Alkaline Phos 38 - 126 U/L - 50 -  AST 10 - 35 U/L 14 17 12   ALT 9 - 46 U/L 10 15 10    CBC Latest Ref Rng & Units 05/16/2019 06/18/2018 03/26/2018  WBC 4.0 - 10.5 K/uL 3.9(L) 6.4 7.5  Hemoglobin 13.0 - 17.0 g/dL 12.9(L) 12.0(L) 12.2(L)  Hematocrit 39 - 52 % 38.9(L) 36.2(L) 36.9(L)  Platelets 150 - 400 K/uL 208 219.0 249.0   Lipid Panel Recent Labs    09/16/19 0809  CHOL 198  TRIG 86  LDLCALC 130*  HDL 49  CHOLHDL 4.0    HEMOGLOBIN A1C Lab Results  Component Value Date   HGBA1C 7.5 (A) 09/24/2019   MPG 206 01/14/2019   TSH Recent Labs    09/16/19 0809  TSH 3.70   Outpatient Medications Prior to Visit  Medication Sig Dispense Refill  . atorvastatin (LIPITOR) 10 MG tablet Take 10 mg by mouth daily.    . Blood Glucose Monitoring Suppl (FREESTYLE FREEDOM LITE) w/Device KIT 1 each by Does not apply route 4 (four) times daily. USE BLOOD GLUCOSE MONITOR TO CHECK BLOOD SUGAR. 1 each 0  . ELIQUIS 5 MG TABS tablet TAKE 1 TABLET TWICE A DAY 180 tablet 3  .  ezetimibe (ZETIA) 10 MG tablet TAKE 1 TABLET EVERY EVENING AFTER DINNER 90 tablet 1  . FREESTYLE LITE test strip USE TO CHECK BLOOD SUGAR FOUR TIMES A DAY AS INSTRUCTED 400 each 1  . glipiZIDE (GLUCOTROL XL) 5 MG 24 hr tablet Take  1 tablet (5 mg total) by mouth daily with breakfast. 90 tablet 1  . Insulin Pen Needle (B-D ULTRAFINE III SHORT PEN) 31G X 8 MM MISC 1 each by Does not apply route as directed. 100 each 1  . Lancets (FREESTYLE) lancets Use as instructed to check blood sugar 4 times per day dx code 250.00 400 each 3  . diltiazem (CARDIZEM CD) 240 MG 24 hr capsule Daily (Patient not taking: Reported on 01/14/2020) 90 capsule 3  . atorvastatin (LIPITOR) 10 MG tablet Take 1 tablet (10 mg total) by mouth daily. 90 tablet 3  . benazepril (LOTENSIN) 10 MG tablet Take 1 tablet (10 mg total) by mouth every evening. (Patient not taking: Reported on 01/14/2020) 90 tablet 3  . simvastatin (ZOCOR) 20 MG tablet Take 20 mg by mouth daily.     No facility-administered medications prior to visit.   Cardiac Studies:   Lexiscan stress 02/23/12: Normal perfusion. No ischemia. Patient in [facility] echo 12/20/11: Normal LVEF. Mild MV calcification. Normal LA size.  Holter Monitor 12/26/11: Brief A. fibrillation with RVR . NO symptoms reported. Paroxysmal epidoses. ED visit 06/14/2016: A. Fib with RVR.  EKG:  EKG 01/09/2020: Atrial fibrillation with rapid ventricular response at the rate of 120 bpm, normal axis, nonspecific T abnormality.  Compared to April 2020, heart rate was 98 bpm.  Patient previously was in atrial flutter.  Assessment:     ICD-10-CM   1. Persistent atrial fibrillation (HCC)  I48.19   2. Hypercholesteremia  E78.00   3. Essential hypertension  I10    Medications Discontinued During This Encounter  Medication Reason  . atorvastatin (LIPITOR) 10 MG tablet Change in therapy  . simvastatin (ZOCOR) 20 MG tablet Change in therapy  . benazepril (LOTENSIN) 10 MG tablet Error    No  orders of the defined types were placed in this encounter.   CHA2DS2-VASc Score is 4.  Yearly risk of stroke: 4.8% (A, HTN, DM).  Score of 1=0.6; 2=2.2; 3=3.2; 4=4.8; 5=7.2; 6=9.8; 7=>9.8) -(CHF; HTN; vasc disease DM,  Male = 1; Age <65 =0; 65-74 = 1,  >75 =2; stroke/embolism= 2).    Recommendations:   SHARBEL SAHAGUN  is a 79 y.o. male  with history of diabetes mellitus uncontrolled, hypertension and hyperlipidemia presents here for follow-up of paroxysmal atrial fibrillation. He has double vision and prism colors due to h/o retinal detachment (chronic) but has also developed diabetic retinopathy left eye, chronic vertigo. He has sleep apnea and uses CPAP on a regular basis.  At last visit on 01/09/2020 he was in atrial fibrillation with RVR. Increased diltiazem CD from 120 to 260m daily. Echo scheduled for 01/26/20. Started atorvastatin 14mdaily. Refilled benazepril for hypertension.  They wanted to come in to reconcile the medications.  Patient's wife states that he was never on diltiazem 120 mg p.o. discontinued long time ago and also benazepril was discontinued due to dizziness.  Advised him that in view of A. fib with RVR, elevated blood pressure, we should probably continue diltiazem CD to 40 mg daily.  I would not restarted benazepril.  We will follow up on his echocardiogram prior to his next office visit and then decide whether an attempt at cardioversion is appropriate.  I have reconciled all his medications and the list of the medications were given to the patient and his wife.  Simvastatin was discontinued as lipids were not controlled and he was not on it on his last office visit, atorvastatin has been prescribed.  Advised him to restart scratch that advised him to start taking atorvastatin once his medications are mailed to him.  He will keep his appointment for the coming 3 to 4-week follow-up.   Adrian Prows, MD, [facility] 01/14/2020, 8:53 PM Office: [phone]

## 2020-01-26 ENCOUNTER — Ambulatory Visit: Payer: Medicare Other

## 2020-01-26 ENCOUNTER — Other Ambulatory Visit: Payer: Self-pay

## 2020-01-26 DIAGNOSIS — I4819 Other persistent atrial fibrillation: Secondary | ICD-10-CM

## 2020-01-26 DIAGNOSIS — IMO0002 Reserved for concepts with insufficient information to code with codable children: Secondary | ICD-10-CM

## 2020-01-26 DIAGNOSIS — I10 Essential (primary) hypertension: Secondary | ICD-10-CM

## 2020-01-26 DIAGNOSIS — I428 Other cardiomyopathies: Secondary | ICD-10-CM

## 2020-01-27 NOTE — Progress Notes (Signed)
I have entered a nuclear stress test order. Let patient know the heart function is mildly decreased and he has not had any recent stress in a while and I would like to exclude significant coronary blockages. It will be a chemical stress as he cannot walk fast on treadmill due to back issues. He could try walking if he prefers  [employer]

## 2020-01-28 NOTE — Progress Notes (Signed)
Called patient, NA, LMAM

## 2020-01-29 NOTE — Progress Notes (Signed)
Spoke with patient wife, transferred call to front desk.

## 2020-02-09 ENCOUNTER — Other Ambulatory Visit: Payer: Self-pay

## 2020-02-09 ENCOUNTER — Ambulatory Visit: Payer: Medicare Other

## 2020-02-09 DIAGNOSIS — I428 Other cardiomyopathies: Secondary | ICD-10-CM

## 2020-02-09 DIAGNOSIS — I4819 Other persistent atrial fibrillation: Secondary | ICD-10-CM

## 2020-02-09 DIAGNOSIS — IMO0002 Reserved for concepts with insufficient information to code with codable children: Secondary | ICD-10-CM

## 2020-02-11 ENCOUNTER — Other Ambulatory Visit (HOSPITAL_COMMUNITY)
Admission: RE | Admit: 2020-02-11 | Discharge: 2020-02-11 | Disposition: A | Discharge status: Home / Self Care | Payer: Medicare Other | Source: Ambulatory Visit | Attending: Cardiology | Admitting: Cardiology

## 2020-02-11 DIAGNOSIS — E7800 Pure hypercholesterolemia, unspecified: Secondary | ICD-10-CM | POA: Diagnosis present

## 2020-02-11 LAB — LIPID PANEL
Cholesterol: 143 mg/dL (ref 0–200)
HDL: 52 mg/dL (ref >40)
LDL Cholesterol: 79 mg/dL (ref 0–99)
Total CHOL/HDL Ratio: 2.8 RATIO
Triglycerides: 62 mg/dL (ref <150)
VLDL: 12 mg/dL (ref 0–40)

## 2020-02-12 ENCOUNTER — Other Ambulatory Visit: Payer: Self-pay | Admitting: "Endocrinology

## 2020-02-16 ENCOUNTER — Other Ambulatory Visit: Payer: Self-pay | Admitting: "Endocrinology

## 2020-02-16 ENCOUNTER — Encounter: Payer: Self-pay | Admitting: Cardiology

## 2020-02-16 ENCOUNTER — Other Ambulatory Visit: Payer: Self-pay

## 2020-02-16 ENCOUNTER — Ambulatory Visit: Payer: Medicare Other | Admitting: Cardiology

## 2020-02-16 VITALS — BP 139/71 | HR 73 | Resp 16 | Ht 70.0 in | Wt 213.0 lb

## 2020-02-16 DIAGNOSIS — I4819 Other persistent atrial fibrillation: Secondary | ICD-10-CM

## 2020-02-16 DIAGNOSIS — I428 Other cardiomyopathies: Secondary | ICD-10-CM

## 2020-02-16 DIAGNOSIS — E7800 Pure hypercholesterolemia, unspecified: Secondary | ICD-10-CM

## 2020-02-16 DIAGNOSIS — I10 Essential (primary) hypertension: Secondary | ICD-10-CM

## 2020-02-16 MED ORDER — METOPROLOL SUCCINATE ER 25 MG PO TB24: 25.0000 mg | ORAL_TABLET | Freq: Every day | ORAL | 1 refills | Status: DC

## 2020-02-16 NOTE — Progress Notes (Signed)
 Primary Physician:  Patient, No Pcp Per   Patient ID: Jonathan Hensley, male    DOB: 08/26/1940, 79 y.o.   MRN: [id number]  Subjective:    Chief Complaint  Patient presents with  . Persistent atrial fibrillation (HCC)  . Hypertension  . Follow-up    6 week     HPI: Jonathan Hensley  is a 79 y.o. male  with history of diabetes mellitus uncontrolled, hypertension and hyperlipidemia presents here for follow-up of paroxysmal atrial fibrillation. He has double vision and prism colors due to h/o retinal detachment (chronic) but has also developed diabetic retinopathy left eye, chronic vertigo. He has sleep apnea and uses CPAP on a regular basis.  He denies any chest pain, shortness of breath.  Has back issues that are stable with using a back brace, he has had 4 surgeries in past. He states he is otherwise going well. No palpitations, fatigue or dyspnea. Accompanied by his wife.  Due to A. fib with RVR, I repeated echocardiogram and also due to new onset cardiomyopathy, and nuclear stress test was also performed in September 2021 and he now presents for follow-up.  This is a 1 month follow-up.  He is tolerating increased diltiazem dose from 120 mg to 240 mg and also tolerating atorvastatin which was switched from simvastatin.  Past Medical History:  Diagnosis Date  . Arthritis    rheumatoid in his hands  . Atrial fibrillation (HCC)   . Bulging lumbar disc   . Cancer (HCC)   . DDD (degenerative disc disease), cervical   . Diabetes mellitus   . Diabetic retinopathy (HCC)   . Double vision   . Dysrhythmia    Atrial Fibrillation  . History of prostate cancer   . Hypertension   . Low back pain   . Nephrolithiasis   . Sleep apnea    uses cpap    Past Surgical History:  Procedure Laterality Date  . APPENDECTOMY    . BACK SURGERY    . Bilateral L4-5 lumbar decompression including bilateral  11/2008  . brachytherapy    . COLONOSCOPY    . CYSTOSCOPY    . Left L4-L5 lumbar laminotomy  and microdiskectomy with  07/14/2008    . LITHOTRIPSY    . LUMBAR LAMINECTOMY/DECOMPRESSION MICRODISCECTOMY Left 08/18/2013   Procedure: Left Lumbar Five-Sacral One Microdiskectomy;  Surgeon: Robert W Nudelman, MD;  Location: [facility];  Service: Neurosurgery;  Laterality: Left;  Left Lumbar Five-Sacral One Microdiskectomy  . rotator cuff surgery    . Scleral buckle, laser photocoagulation, and anterior chamber  01/04/2009   Social History   Tobacco Use  . Smoking status: Former Smoker    Quit date: 02/12/1973    Years since quitting: 47.0  . Smokeless tobacco: Never Used  . Tobacco comment: starting at age 16  Substance Use Topics  . Alcohol use: No   Marital Status: Married   Review of Systems  Cardiovascular: Negative for chest pain, dyspnea on exertion and leg swelling.  Musculoskeletal: Positive for arthritis and back pain.  Gastrointestinal: Negative for melena.  Psychiatric/Behavioral: Positive for memory loss.   Objective:  Blood pressure 139/71, pulse 73, resp. rate 16, height 5' 10" (1.778 m), weight 213 lb (96.6 kg), SpO2 97 %. Body mass index is 30.56 kg/m.  Vitals with BMI 02/16/2020 01/14/2020 01/09/2020  Height 5' 10" 5' 10" 5' 10"  Weight 213 lbs 214 lbs 6 oz 216 lbs  BMI 30.56 30.76 30.99  Systolic 139 139   250  Diastolic 71 90 84  Pulse 73 96 96    Physical Exam Constitutional:      General: He is not in acute distress.    Appearance: He is well-developed.  HENT:     Head: Atraumatic.  Eyes:     Conjunctiva/sclera: Conjunctivae normal.  Neck:     Thyroid: No thyromegaly.     Vascular: No JVD.  Cardiovascular:     Rate and Rhythm: Tachycardia present. Rhythm irregularly irregular.     Pulses: Normal pulses and intact distal pulses.     Heart sounds: No murmur heard.  No gallop. No S3 or S4 sounds.      Comments: S1 is variable, S2 is normal.  Pulmonary:     Effort: Pulmonary effort is normal.     Breath sounds: Normal breath sounds.  Abdominal:      General: Bowel sounds are normal.     Palpations: Abdomen is soft.  Musculoskeletal:        General: Normal range of motion.     Cervical back: Neck supple.  Skin:    General: Skin is warm and dry.  Neurological:     Mental Status: He is alert.    Radiology: No results found.  Laboratory examination:   CMP Latest Ref Rng & Units 09/16/2019 05/16/2019 01/14/2019  Glucose 65 - 99 mg/dL 98 222(H) 206(H)  BUN 7 - 25 mg/dL 37(H) 28(H) 39(H)  Creatinine 0.70 - 1.18 mg/dL 1.23(H) 1.33(H) 1.32(H)  Sodium 135 - 146 mmol/L 140 134(L) 138  Potassium 3.5 - 5.3 mmol/L 4.9 4.1 4.9  Chloride 98 - 110 mmol/L 106 103 104  CO2 20 - 32 mmol/L 28 21(L) 27  Calcium 8.6 - 10.3 mg/dL 9.7 9.1 9.5  Total Protein 6.1 - 8.1 g/dL 6.8 7.4 6.6  Total Bilirubin 0.2 - 1.2 mg/dL 0.4 0.5 0.3  Alkaline Phos 38 - 126 U/L - 50 -  AST 10 - 35 U/L _0 ALT 9 - 46 U/L _1 CBC Latest Ref Rng & Units 05/16/2019 06/18/2018 03/26/2018  WBC 4.0 - 10.5 K/uL 3.9(L) 6.4 7.5  Hemoglobin 13.0 - 17.0 g/dL 12.9(L) 12.0(L) 12.2(L)  Hematocrit 39 - 52 % 38.9(L) 36.2(L) 36.9(L)  Platelets 150 - 400 K/uL 208 219.0 249.0   Lipid Panel Recent Labs    09/16/19 0809 02/11/20 1000  CHOL 198 143  TRIG 86 62  LDLCALC 130* 79  VLDL  --  12  HDL 49 52  CHOLHDL 4.0 2.8    HEMOGLOBIN A1C Lab Results  Component Value Date   HGBA1C 7.5 (A) 09/24/2019   MPG 206 01/14/2019   TSH Recent Labs    09/16/19 0809  TSH 3.70   Outpatient Medications Prior to Visit  Medication Sig Dispense Refill  . atorvastatin (LIPITOR) 10 MG tablet Take 10 mg by mouth daily.    . Blood Glucose Monitoring Suppl (FREESTYLE FREEDOM LITE) w/Device KIT 1 each by Does not apply route 4 (four) times daily. USE BLOOD GLUCOSE MONITOR TO CHECK BLOOD SUGAR. 1 each 0  . diltiazem (CARDIZEM CD) 240 MG 24 hr capsule Daily 90 capsule 3  . ELIQUIS 5 MG TABS tablet TAKE 1 TABLET TWICE A DAY 180 tablet 3  . ezetimibe (ZETIA) 10 MG tablet TAKE 1 TABLET  EVERY EVENING AFTER DINNER 90 tablet 3  . FREESTYLE LITE test strip USE TO CHECK BLOOD SUGAR FOUR TIMES A DAY AS INSTRUCTED 400 each 1  . GLIPIZIDE  XL 5 MG 24 hr tablet TAKE 1 TABLET DAILY WITH BREAKFAST 90 tablet 1  . Insulin Pen Needle (B-D ULTRAFINE III SHORT PEN) 31G X 8 MM MISC 1 each by Does not apply route as directed. 100 each 1  . Lancets (FREESTYLE) lancets Use as instructed to check blood sugar 4 times per day dx code 250.00 400 each 3   No facility-administered medications prior to visit.   Cardiac Studies:   Holter Monitor 12/26/11: Brief A. fibrillation with RVR . NO symptoms reported. Paroxysmal epidoses. ED visit 06/14/2016: A. Fib with RVR.  Lexiscan Tetrofosmin Stress Test  02/09/2020: Nondiagnostic ECG stress. Resting EKG demonstrated normal sinus rhythm, occasional premature atrial contractions. Peak EKG, no ST-T wave abnormalities. During infusion the peak ECG revealed brief atrial tachycardia and premature atrial contractions. There is a fixed mild defect in inferior region consistent with diaphragmatic attenuation. Superimposed on this, there is a partially reversible small sized mild defect in the inferior region.  Overall LV systolic function is normal without regional wall motion abnormalities. Stress LV EF: 58%.  Compared to 02/23/12, inferior defect is new. Low risk  Echocardiogram 01/26/2020: Left ventricle cavity is normal in size. Moderate concentric hypertrophy of the left ventricle. Normal global wall motion. Mildly depressed LV systolic function with EF 45-50%%. Unable to evaluate diastolic function due to atrial fibrillation.  Left atrial cavity is severely dilated. Structurally normal mitral valve with no regurgitation. Mild tricuspid regurgitation.  No evidence of pulmonary hypertension.   EKG:   EKG 02/16/2020: Atrial fibrillation with controlled ventricular response at rate of 98 bpm, normal axis, nonspecific T abnormality.     EKG 01/09/2020: Atrial  fibrillation with rapid ventricular response at the rate of 120 bpm, normal axis, nonspecific T abnormality.  Compared to April 2020, heart rate was 98 bpm.  Patient previously was in atrial flutter.  Assessment:     ICD-10-CM   1. Persistent atrial fibrillation (HCC)  I48.19 EKG 12-Lead    metoprolol succinate (TOPROL-XL) 25 MG 24 hr tablet    PCV ECHOCARDIOGRAM COMPLETE  2. Non-ischemic cardiomyopathy (HCC)  I42.8 PCV ECHOCARDIOGRAM COMPLETE  3. Hypercholesteremia  E78.00   4. Essential hypertension  I10    There are no discontinued medications.  Meds ordered this encounter  Medications  . metoprolol succinate (TOPROL-XL) 25 MG 24 hr tablet    Sig: Take 1 tablet (25 mg total) by mouth daily. Take with or immediately following a meal.    Dispense:  30 tablet    Refill:  1    CHA2DS2-VASc Score is 4.  Yearly risk of stroke: 4.8% (A, HTN, DM).  Score of 1=0.6; 2=2.2; 3=3.2; 4=4.8; 5=7.2; 6=9.8; 7=>9.8) -(CHF; HTN; vasc disease DM,  Male = 1; Age <65 =0; 65-74 = 1,  >75 =2; stroke/embolism= 2).    Recommendations:   Jonathan Hensley  is a 79 y.o. male  with history of diabetes mellitus uncontrolled, hypertension and hyperlipidemia presents here for follow-up of paroxysmal atrial fibrillation. He has double vision and prism colors due to h/o retinal detachment (chronic) but has also developed diabetic retinopathy left eye, chronic vertigo. He has sleep apnea and uses CPAP on a regular basis.  Since being on higher dose of diltiazem, heart rate is improved but also blood pressure is improved as well.  However I would like to improve his heart rate even slightly better, previously on beta-blockers he had complained about dizziness but I would like to try metoprolol succinate at 25 mg daily.  In view of advanced age, do not want to cause significant AV nodal blockade with 2 - chronotropic agents.  Due to cardiomyopathy, probably nonischemic from A. fib, I would like to repeat echocardiogram  in 6 months.  Although nuclear stress test reveals very mild ischemia, overall low risk and I prefer to continue medical therapy for now.  Continue anticoagulation.  I would like to see him back in 6 months for follow-up.  If he tolerates metoprolol succinate, will send for 90-day Rx.  With regard to hyperlipidemia, LDL is improved significantly.  Continue present dose Lipitor at 10 mg daily.  I had discontinued simvastatin on his prior office visit.    , MD, FACC 02/16/2020, 3:16 PM Office: [phone]  

## 2020-02-23 ENCOUNTER — Telehealth: Payer: Self-pay

## 2020-02-23 ENCOUNTER — Other Ambulatory Visit: Payer: Self-pay

## 2020-02-23 DIAGNOSIS — I4819 Other persistent atrial fibrillation: Secondary | ICD-10-CM

## 2020-02-23 MED ORDER — METOPROLOL SUCCINATE ER 25 MG PO TB24: 25.0000 mg | ORAL_TABLET | Freq: Every day | ORAL | 1 refills | Status: DC

## 2020-02-23 NOTE — Telephone Encounter (Signed)
Please do.  Thanks.

## 2020-02-23 NOTE — Telephone Encounter (Signed)
Received a call from patient's spouse. Spouse states that patient has been taking the Metoprolol 25mg  and is tolerating it "very well.' Would you like me to call in 90 day supply bc patient is tolerating medication? Please advise. Thanks!

## 2020-02-23 NOTE — Telephone Encounter (Signed)
Sent to pharmacy on file 

## 2020-03-23 ENCOUNTER — Other Ambulatory Visit: Payer: Self-pay | Admitting: "Endocrinology

## 2020-03-23 DIAGNOSIS — E1165 Type 2 diabetes mellitus with hyperglycemia: Secondary | ICD-10-CM

## 2020-03-26 LAB — COMPLETE METABOLIC PANEL WITH GFR
AG Ratio: 1.5 (calc) (ref 1.0–2.5)
ALT: 12 U/L (ref 9–46)
AST: 14 U/L (ref 10–35)
Albumin: 4.1 g/dL (ref 3.6–5.1)
Alkaline phosphatase (APISO): 52 U/L (ref 35–144)
BUN/Creatinine Ratio: 27 (calc) — ABNORMAL HIGH (ref 6–22)
BUN: 41 mg/dL — ABNORMAL HIGH (ref 7–25)
CO2: 28 mmol/L (ref 20–32)
Calcium: 9.4 mg/dL (ref 8.6–10.3)
Chloride: 105 mmol/L (ref 98–110)
Creat: 1.5 mg/dL — ABNORMAL HIGH (ref 0.70–1.18)
GFR, Est African American: 51 mL/min/{1.73_m2} — ABNORMAL LOW (ref >=60)
GFR, Est Non African American: 44 mL/min/{1.73_m2} — ABNORMAL LOW (ref >=60)
Globulin: 2.8 g/dL (calc) (ref 1.9–3.7)
Glucose, Bld: 170 mg/dL — ABNORMAL HIGH (ref 65–139)
Potassium: 4.5 mmol/L (ref 3.5–5.3)
Sodium: 141 mmol/L (ref 135–146)
Total Bilirubin: 0.3 mg/dL (ref 0.2–1.2)
Total Protein: 6.9 g/dL (ref 6.1–8.1)

## 2020-03-26 LAB — TSH: TSH: 5.54 mIU/L — ABNORMAL HIGH (ref 0.40–4.50)

## 2020-03-26 LAB — T4, FREE: Free T4: 1 ng/dL (ref 0.8–1.8)

## 2020-03-31 ENCOUNTER — Other Ambulatory Visit: Payer: Self-pay

## 2020-03-31 ENCOUNTER — Ambulatory Visit (INDEPENDENT_AMBULATORY_CARE_PROVIDER_SITE_OTHER): Payer: Medicare Other | Admitting: "Endocrinology

## 2020-03-31 ENCOUNTER — Encounter: Payer: Self-pay | Admitting: "Endocrinology

## 2020-03-31 VITALS — BP 140/76 | HR 56 | Ht 70.0 in | Wt 217.6 lb

## 2020-03-31 DIAGNOSIS — I10 Essential (primary) hypertension: Secondary | ICD-10-CM

## 2020-03-31 DIAGNOSIS — E782 Mixed hyperlipidemia: Secondary | ICD-10-CM | POA: Diagnosis not present

## 2020-03-31 DIAGNOSIS — E1165 Type 2 diabetes mellitus with hyperglycemia: Secondary | ICD-10-CM | POA: Diagnosis not present

## 2020-03-31 DIAGNOSIS — E039 Hypothyroidism, unspecified: Secondary | ICD-10-CM | POA: Diagnosis not present

## 2020-03-31 LAB — POCT GLYCOSYLATED HEMOGLOBIN (HGB A1C): Hemoglobin A1C: 7.7 % — AB (ref 4.0–5.6)

## 2020-03-31 MED ORDER — LEVOTHYROXINE SODIUM 25 MCG PO TABS: 25.0000 ug | ORAL_TABLET | Freq: Every day | ORAL | 1 refills | Status: DC

## 2020-03-31 MED ORDER — LANTUS SOLOSTAR 100 UNIT/ML ~~LOC~~ SOPN: 38.0000 [IU] | PEN_INJECTOR | Freq: Every day | SUBCUTANEOUS | 1 refills | Status: DC

## 2020-03-31 NOTE — Progress Notes (Signed)
03/31/2020, 10:59 AM    Endocrinology follow-up note    Subjective:    Patient ID: Jonathan Hensley, male    DOB: [birth year].  Jonathan Hensley is being seen in follow-up for the  management of currently uncontrolled symptomatic diabetes. Previously seen by [name].  Past Medical History:  Diagnosis Date  . Arthritis    rheumatoid in his hands  . Atrial fibrillation ([place])   . Bulging lumbar disc   . Cancer ([place])   . DDD (degenerative disc disease), cervical   . Diabetes mellitus   . Diabetic retinopathy ([place])   . Double vision   . Dysrhythmia    Atrial Fibrillation  . History of prostate cancer   . Hypertension   . Low back pain   . Nephrolithiasis   . Sleep apnea    uses cpap    Past Surgical History:  Procedure Laterality Date  . APPENDECTOMY    . BACK SURGERY    . Bilateral L4-5 lumbar decompression including bilateral  11/2008  . brachytherapy    . COLONOSCOPY    . CYSTOSCOPY    . Left L4-L5 lumbar laminotomy and microdiskectomy with  07/14/2008    . LITHOTRIPSY    . LUMBAR LAMINECTOMY/DECOMPRESSION MICRODISCECTOMY Left 08/18/2013   Procedure: Left Lumbar Five-Sacral One Microdiskectomy;  Surgeon: [name], MD;  Location: [facility];  Service: Neurosurgery;  Laterality: Left;  Left Lumbar Five-Sacral One Microdiskectomy  . rotator cuff surgery    . Scleral buckle, laser photocoagulation, and anterior chamber  01/04/2009    Social History   Socioeconomic History  . Marital status: Married    Spouse name: [name]  . Number of children: 2  . Years of education: 47  . Highest education level: Not on file  Occupational History  . Not on file  Tobacco Use  . Smoking status: Former Smoker    Quit date: 02/12/1973    Years since quitting: 47.1  . Smokeless tobacco: Never Used  . Tobacco comment: starting at age 44  Vaping Use  . Vaping Use: Never used   Substance and Sexual Activity  . Alcohol use: No  . Drug use: No  . Sexual activity: Yes  Other Topics Concern  . Not on file  Social History Narrative   Lives at home with wife   Drinks 4 cups of coffee a day   Social Determinants of Health   Financial Resource Strain:   . Difficulty of Paying Living Expenses: Not on file  Food Insecurity:   . Worried About Charity fundraiser in the Last Year: Not on file  . Ran Out of Food in the Last Year: Not on file  Transportation Needs:   . Lack of Transportation (Medical): Not on file  . Lack of Transportation (Non-Medical): Not on file  Physical Activity:   . Days of Exercise per Week: Not on file  . Minutes of Exercise per Session: Not on file  Stress:   . Feeling of Stress : Not on file  Social Connections:   . Frequency of Communication with Friends and  Family: Not on file  . Frequency of Social Gatherings with Friends and Family: Not on file  . Attends Religious Services: Not on file  . Active Member of Clubs or Organizations: Not on file  . Attends Archivist Meetings: Not on file  . Marital Status: Not on file    Family History  Problem Relation Age of Onset  . Heart failure Father   . Heart failure Mother   . Heart failure Brother        Half brother.  . Colon cancer Neg Hx     Outpatient Encounter Medications as of 03/31/2020  Medication Sig  . atorvastatin (LIPITOR) 10 MG tablet Take 10 mg by mouth daily.  . Blood Glucose Monitoring Suppl (FREESTYLE FREEDOM LITE) w/Device KIT 1 each by Does not apply route 4 (four) times daily. USE BLOOD GLUCOSE MONITOR TO CHECK BLOOD SUGAR.  Marland Kitchen diltiazem (CARDIZEM CD) 240 MG 24 hr capsule Daily  . ELIQUIS 5 MG TABS tablet TAKE 1 TABLET TWICE A DAY  . ezetimibe (ZETIA) 10 MG tablet TAKE 1 TABLET EVERY EVENING AFTER DINNER  . FREESTYLE LITE test strip USE TO CHECK BLOOD SUGAR FOUR TIMES A DAY AS INSTRUCTED  . GLIPIZIDE XL 5 MG 24 hr tablet TAKE 1 TABLET DAILY WITH  BREAKFAST  . insulin glargine (LANTUS SOLOSTAR) 100 UNIT/ML Solostar Pen Inject 38 Units into the skin at bedtime.  . Insulin Pen Needle (B-D ULTRAFINE III SHORT PEN) 31G X 8 MM MISC 1 each by Does not apply route as directed.  . Lancets (FREESTYLE) lancets Use as instructed to check blood sugar 4 times per day dx code 250.00  . levothyroxine (SYNTHROID) 25 MCG tablet Take 1 tablet (25 mcg total) by mouth daily before breakfast.  . metoprolol succinate (TOPROL-XL) 25 MG 24 hr tablet Take 1 tablet (25 mg total) by mouth daily. Take with or immediately following a meal.  . [DISCONTINUED] LANTUS SOLOSTAR 100 UNIT/ML Solostar Pen INJECT 36 UNITS UNDER THE SKIN DAILY   No facility-administered encounter medications on file as of 03/31/2020.    ALLERGIES: Allergies  Allergen Reactions  . Beta Adrenergic Blockers Other (See Comments)    Severe dizziness  . Other Other (See Comments)    Beta or alpha blockers: severe dizziness  . Tramadol Other (See Comments)    Dizziness     VACCINATION STATUS: Immunization History  Administered Date(s) Administered  . Influenza Split 02/12/2013  . Influenza, High Dose Seasonal PF 03/26/2018  . Influenza,inj,Quad PF,6+ Mos 02/26/2015  . Influenza-Unspecified 02/17/2014  . Pneumococcal Conjugate-13 11/26/2014  . Tdap 07/21/2012    Diabetes He presents for his follow-up diabetic visit. He has type 2 diabetes mellitus. Onset time: He was diagnosed at approximate age of 40 years. His disease course has been stable. There are no hypoglycemic associated symptoms. Pertinent negatives for hypoglycemia include no confusion, headaches, pallor or seizures. Pertinent negatives for diabetes include no chest pain, no fatigue, no polydipsia, no polyphagia, no polyuria and no weakness. There are no hypoglycemic complications. Symptoms are stable. Diabetic complications include nephropathy and retinopathy. Risk factors for coronary artery disease include dyslipidemia,  family history, male sex, sedentary lifestyle and tobacco exposure. Current diabetic treatment includes insulin injections and oral agent (monotherapy) (He is currently on Lantus 36 units nightly, glipizide 5 mg daily. ). He is compliant with treatment most of the time. His weight is increasing steadily. He is following a generally unhealthy diet. When asked about meal planning, he reported none. He  has not had a previous visit with a dietitian. He participates in exercise intermittently. His home blood glucose trend is decreasing steadily. His breakfast blood glucose range is generally 110-130 mg/dl. His bedtime blood glucose range is generally 180-200 mg/dl. His overall blood glucose range is 140-180 mg/dl. (His presents with fluctuating glycemic profile, controlled fasting and still  slightly above target postprandial blood glucose profile. His point-of-care A1c 7.7%, overall improving from 9.1%. He did not document or report  hypoglycemia. ) An ACE inhibitor/angiotensin II receptor blocker is being taken. Eye exam is current.  Hyperlipidemia This is a chronic problem. The current episode started more than 1 year ago. The problem is controlled. Exacerbating diseases include diabetes and obesity. Pertinent negatives include no chest pain, myalgias or shortness of breath. Current antihyperlipidemic treatment includes statins. Risk factors for coronary artery disease include diabetes mellitus, dyslipidemia, hypertension, male sex and a sedentary lifestyle.  Hypertension This is a chronic problem. The current episode started more than 1 year ago. Pertinent negatives include no chest pain, headaches, neck pain, palpitations or shortness of breath. Risk factors for coronary artery disease include dyslipidemia, diabetes mellitus, male gender, obesity, sedentary lifestyle and smoking/tobacco exposure. Past treatments include ACE inhibitors. Hypertensive end-organ damage includes retinopathy.     Review of  systems  Constitutional: + Minimally fluctuating body weight,  current  Body mass index is 31.22 kg/m. , no fatigue, no subjective hyperthermia, no subjective hypothermia Eyes: no blurry vision, no xerophthalmia ENT: no sore throat, no nodules palpated in throat, no dysphagia/odynophagia, no hoarseness Cardiovascular: no Chest Pain, no Shortness of Breath, no palpitations, no leg swelling Respiratory: no cough, no shortness of breath Gastrointestinal: no Nausea/Vomiting/Diarhhea Musculoskeletal: no muscle/joint aches Skin: no rashes, no hyperemia Neurological: no tremors, no numbness, no tingling, no dizziness Psychiatric: no depression, no anxiety    Objective:    BP 140/76   Pulse (!) 56   Ht 5' 10"  (1.778 m)   Wt 217 lb 9.6 oz (98.7 kg)   BMI 31.22 kg/m   Wt Readings from Last 3 Encounters:  03/31/20 217 lb 9.6 oz (98.7 kg)  02/16/20 213 lb (96.6 kg)  01/14/20 214 lb 6.4 oz (97.3 kg)    Physical Exam- Limited  Constitutional:  Body mass index is 31.22 kg/m. , not in acute distress, normal state of mind Eyes:  EOMI, no exophthalmos Neck: Supple Thyroid: No gross goiter Respiratory: Adequate breathing efforts Musculoskeletal: no gross deformities, strength intact in all four extremities, no gross restriction of joint movements Skin:  no rashes, no hyperemia Neurological: no tremor with outstretched hands,    CMP     Component Value Date/Time   NA 141 03/25/2020 0752   K 4.5 03/25/2020 0752   CL 105 03/25/2020 0752   CO2 28 03/25/2020 0752   GLUCOSE 170 (H) 03/25/2020 0752   BUN 41 (H) 03/25/2020 0752   CREATININE 1.50 (H) 03/25/2020 0752   CALCIUM 9.4 03/25/2020 0752   PROT 6.9 03/25/2020 0752   ALBUMIN 4.0 05/16/2019 0819   AST 14 03/25/2020 0752   ALT 12 03/25/2020 0752   ALKPHOS 50 05/16/2019 0819   BILITOT 0.3 03/25/2020 0752   GFRNONAA 44 (L) 03/25/2020 0752   GFRAA 51 (L) 03/25/2020 0752     Diabetic Labs (most recent): Lab Results  Component  Value Date   HGBA1C 7.7 (A) 03/31/2020   HGBA1C 7.5 (A) 09/24/2019   HGBA1C 9.1 (A) 06/26/2019     Lipid Panel ( most recent) Lipid Panel  Component Value Date/Time   CHOL 143 02/11/2020 1000   TRIG 62 02/11/2020 1000   HDL 52 02/11/2020 1000   CHOLHDL 2.8 02/11/2020 1000   VLDL 12 02/11/2020 1000   LDLCALC 79 02/11/2020 1000   LDLCALC 130 (H) 09/16/2019 0809      Lab Results  Component Value Date   TSH 5.54 (H) 03/25/2020   TSH 3.70 09/16/2019   TSH 3.05 01/14/2019   TSH 4.80 (H) 09/30/2018   TSH 3.27 12/21/2017   TSH 2.60 06/11/2017   TSH 2.36 05/22/2016   TSH 2.18 06/16/2015   TSH 2.370 12/19/2011   FREET4 1.0 03/25/2020   FREET4 1.0 09/16/2019   FREET4 1.2 01/14/2019   FREET4 0.9 09/30/2018   FREET4 0.92 06/11/2017   FREET4 0.95 05/22/2016   FREET4 0.92 06/16/2015      Assessment & Plan:   1. Uncontrolled type 2 diabetes mellitus with hyperglycemia ([place])  - Jonathan Hensley has currently uncontrolled symptomatic type 2 DM since  79 years of age. His presents with fluctuating glycemic profile, controlled fasting and still  slightly above target postprandial blood glucose profile. His point-of-care A1c 7.7%, overall improving from 9.1%. He did not document or report  hypoglycemia.   -his diabetes is complicated by retinopathy and he remains at a high risk for more acute and chronic complications which include CAD, CVA, CKD, retinopathy, and neuropathy. These are all discussed in detail with him.  - I have counseled him on diet management and weight loss, by adopting a carbohydrate restricted/protein rich diet.  - he  admits there is a room for improvement in his diet and drink choices. -  Suggestion is made for him to avoid simple carbohydrates  from his diet including Cakes, Sweet Desserts / Pastries, Ice Cream, Soda (diet and regular), Sweet Tea, Candies, Chips, Cookies, Sweet Pastries,  Store Bought Juices, Alcohol in Excess of  1-2 drinks a day, Artificial  Sweeteners, Coffee Creamer, and "Sugar-free" Products. This will help patient to have stable blood glucose profile and potentially avoid unintended weight gain.    - I encouraged him to switch to  unprocessed or minimally processed complex starch and increased protein intake (animal or plant source), fruits, and vegetables.  - he is advised to stick to a routine mealtimes to eat 3 meals  a day and avoid unnecessary snacks ( to snack only to correct hypoglycemia).   - I have approached him with the following individualized plan to manage diabetes and patient agrees:   -Given  his presentation with controlled fasting and near target postprandial glycemic profile, he will not need prandial insulin for now. Besides, he is unable to execute in complicated insulin regimen.  -He has benefited and will continue to benefit from long-acting insulin as a basal regimen.  He is advised to increase Lantus to 38 units nightly,    associated with monitoring of blood glucose twice a day-daily before breakfast and at bedtime.  -He has benefited from low-dose glipizide.  He is advised to continue  glipizide 5 mg XL p.o. daily at breakfast.    -He has worsening renal function, advised to stay off of Metformin.  he will be considered for incretin therapy as appropriate next visit.  - Patient specific target  A1c;  LDL, HDL, Triglycerides,  were discussed in detail.  2) Blood Pressure /Hypertension: -His blood pressure is controlled to target.  he is advised to continue his current medications including benazepril 40 mg p.o. daily with breakfast.  3) Lipids/Hyperlipidemia:   Review of his recent lipid panel showed increased LDL at 79 improving from 130.  He is asked to  continue Zocor 40 mg p.o. daily at bedtime.  Also on Zetia 10 mg p.o. nightly.     Side effects and precautions discussed with him.  4)  Weight/Diet: His BMI is 31.2 .    He is a candidate for modest weight loss.  I discussed with him the fact  that loss of 5 - 10% of his  current body weight will have the most impact on his diabetes management.  CDE Consult will be initiated . Exercise, and detailed carbohydrates information provided  -  detailed on discharge instructions.   5) Hypothyroidism:   -He decided to stop Synthroid for unclear reasons.  His previsit labs are still consistent with hypothyroidism.  He will benefit from low-dose thyroid hormone supplements.  I will reapproach him with a lower dose of Synthroid, 25 mg p.o. daily before breakfast.     - We discussed about the correct intake of his thyroid hormone, on empty stomach at fasting, with water, separated by at least 30 minutes from breakfast and other medications,  and separated by more than 4 hours from calcium, iron, multivitamins, acid reflux medications (PPIs). -Patient is made aware of the fact that thyroid hormone replacement is needed for life, dose to be adjusted by periodic monitoring of thyroid function tests.  6) Chronic Care/Health Maintenance:  -he  is on ACEI/ARB and Statin medications and  is encouraged to initiate and continue to follow up with Ophthalmology, Dentist,  Podiatrist at least yearly or according to recommendations, and advised to   stay away from smoking. I have recommended yearly flu vaccine and pneumonia vaccine at least every 5 years; moderate intensity exercise for up to 150 minutes weekly; and  sleep for at least 7 hours a day.   - he is  advised to maintain close follow up with his PMD  for primary care needs, as well as his other providers for optimal and coordinated care.  - Time spent on this patient care encounter:  35 min, of which > 50% was spent in  counseling and the rest reviewing his blood glucose logs , discussing his hypoglycemia and hyperglycemia episodes, reviewing his current and  previous labs / studies  ( including abstraction from other facilities) and medications  doses and developing a  long term treatment plan and  documenting his care.   Please refer to Patient Instructions for Blood Glucose Monitoring and Insulin/Medications Dosing Guide"  in media tab for additional information. Please  also refer to " Patient Self Inventory" in the Media  tab for reviewed elements of pertinent patient history.  Jonathan Hensley participated in the discussions, expressed understanding, and voiced agreement with the above plans.  All questions were answered to his satisfaction. he is encouraged to contact clinic should he have any questions or concerns prior to his return visit.    Follow up plan: - Return in about 6 months (around 09/28/2020) for F/U with Pre-visit Labs, Meter, Logs, A1c here.Glade Lloyd, MD [facility] [address] [place], [place] [address] Phone: [phone]  Fax: [phone]    03/31/2020, 10:59 AM  This note was partially dictated with voice recognition software. Similar sounding words can be transcribed inadequately or may not  be corrected upon review.

## 2020-03-31 NOTE — Patient Instructions (Signed)

## 2020-05-25 ENCOUNTER — Other Ambulatory Visit: Payer: Self-pay

## 2020-05-25 MED ORDER — APIXABAN 5 MG PO TABS
5.0000 mg | ORAL_TABLET | Freq: Two times a day (BID) | ORAL | 3 refills | Status: DC
Start: 2020-05-25 — End: 2021-05-10

## 2020-06-07 DIAGNOSIS — B351 Tinea unguium: Secondary | ICD-10-CM | POA: Diagnosis not present

## 2020-06-07 DIAGNOSIS — E1151 Type 2 diabetes mellitus with diabetic peripheral angiopathy without gangrene: Secondary | ICD-10-CM | POA: Diagnosis not present

## 2020-06-07 DIAGNOSIS — M79675 Pain in left toe(s): Secondary | ICD-10-CM | POA: Diagnosis not present

## 2020-06-07 DIAGNOSIS — M79674 Pain in right toe(s): Secondary | ICD-10-CM | POA: Diagnosis not present

## 2020-07-05 DIAGNOSIS — T148XXA Other injury of unspecified body region, initial encounter: Secondary | ICD-10-CM | POA: Diagnosis not present

## 2020-07-05 DIAGNOSIS — M549 Dorsalgia, unspecified: Secondary | ICD-10-CM | POA: Diagnosis not present

## 2020-08-03 ENCOUNTER — Other Ambulatory Visit: Payer: Self-pay | Admitting: Cardiology

## 2020-08-03 DIAGNOSIS — I4819 Other persistent atrial fibrillation: Secondary | ICD-10-CM

## 2020-08-11 ENCOUNTER — Other Ambulatory Visit: Payer: Self-pay | Admitting: "Endocrinology

## 2020-08-11 DIAGNOSIS — E1165 Type 2 diabetes mellitus with hyperglycemia: Secondary | ICD-10-CM

## 2020-08-16 ENCOUNTER — Other Ambulatory Visit: Payer: Self-pay | Admitting: "Endocrinology

## 2020-08-16 DIAGNOSIS — E1151 Type 2 diabetes mellitus with diabetic peripheral angiopathy without gangrene: Secondary | ICD-10-CM | POA: Diagnosis not present

## 2020-08-16 DIAGNOSIS — M79674 Pain in right toe(s): Secondary | ICD-10-CM | POA: Diagnosis not present

## 2020-08-16 DIAGNOSIS — B351 Tinea unguium: Secondary | ICD-10-CM | POA: Diagnosis not present

## 2020-08-16 DIAGNOSIS — M79675 Pain in left toe(s): Secondary | ICD-10-CM | POA: Diagnosis not present

## 2020-08-17 DIAGNOSIS — S8991XA Unspecified injury of right lower leg, initial encounter: Secondary | ICD-10-CM | POA: Diagnosis not present

## 2020-08-17 DIAGNOSIS — M25561 Pain in right knee: Secondary | ICD-10-CM | POA: Diagnosis not present

## 2020-08-23 ENCOUNTER — Other Ambulatory Visit: Payer: Medicare Other

## 2020-08-30 ENCOUNTER — Ambulatory Visit: Payer: Medicare Other | Admitting: Cardiology

## 2020-09-06 ENCOUNTER — Other Ambulatory Visit: Payer: Self-pay | Admitting: "Endocrinology

## 2020-09-06 DIAGNOSIS — E1165 Type 2 diabetes mellitus with hyperglycemia: Secondary | ICD-10-CM

## 2020-09-07 DIAGNOSIS — E119 Type 2 diabetes mellitus without complications: Secondary | ICD-10-CM | POA: Diagnosis not present

## 2020-09-07 DIAGNOSIS — H04123 Dry eye syndrome of bilateral lacrimal glands: Secondary | ICD-10-CM | POA: Diagnosis not present

## 2020-09-07 DIAGNOSIS — H338 Other retinal detachments: Secondary | ICD-10-CM | POA: Diagnosis not present

## 2020-09-07 DIAGNOSIS — H524 Presbyopia: Secondary | ICD-10-CM | POA: Diagnosis not present

## 2020-09-27 ENCOUNTER — Other Ambulatory Visit: Payer: Medicare Other

## 2020-09-28 ENCOUNTER — Ambulatory Visit: Payer: Medicare Other | Admitting: "Endocrinology

## 2020-10-01 DIAGNOSIS — E1165 Type 2 diabetes mellitus with hyperglycemia: Secondary | ICD-10-CM | POA: Diagnosis not present

## 2020-10-01 DIAGNOSIS — E039 Hypothyroidism, unspecified: Secondary | ICD-10-CM | POA: Diagnosis not present

## 2020-10-02 LAB — T4, FREE: Free T4: 1.15 ng/dL (ref 0.82–1.77)

## 2020-10-02 LAB — COMPREHENSIVE METABOLIC PANEL
ALT: 15 IU/L (ref 0–44)
AST: 16 IU/L (ref 0–40)
Albumin/Globulin Ratio: 1.5 (ref 1.2–2.2)
Albumin: 4.1 g/dL (ref 3.7–4.7)
Alkaline Phosphatase: 92 IU/L (ref 44–121)
BUN/Creatinine Ratio: 22 (ref 10–24)
BUN: 26 mg/dL (ref 8–27)
Bilirubin Total: 0.2 mg/dL (ref 0.0–1.2)
CO2: 21 mmol/L (ref 20–29)
Calcium: 9.5 mg/dL (ref 8.6–10.2)
Chloride: 106 mmol/L (ref 96–106)
Creatinine, Ser: 1.2 mg/dL (ref 0.76–1.27)
Globulin, Total: 2.8 g/dL (ref 1.5–4.5)
Glucose: 176 mg/dL — ABNORMAL HIGH (ref 65–99)
Potassium: 4.5 mmol/L (ref 3.5–5.2)
Sodium: 143 mmol/L (ref 134–144)
Total Protein: 6.9 g/dL (ref 6.0–8.5)
eGFR: 62 mL/min/{1.73_m2} (ref >59)

## 2020-10-02 LAB — TSH: TSH: 5.41 u[IU]/mL — ABNORMAL HIGH (ref 0.450–4.500)

## 2020-10-04 ENCOUNTER — Ambulatory Visit: Payer: Medicare Other | Admitting: Student

## 2020-10-04 DIAGNOSIS — B351 Tinea unguium: Secondary | ICD-10-CM | POA: Diagnosis not present

## 2020-10-04 DIAGNOSIS — M79674 Pain in right toe(s): Secondary | ICD-10-CM | POA: Diagnosis not present

## 2020-10-04 DIAGNOSIS — E1151 Type 2 diabetes mellitus with diabetic peripheral angiopathy without gangrene: Secondary | ICD-10-CM | POA: Diagnosis not present

## 2020-10-04 DIAGNOSIS — M79675 Pain in left toe(s): Secondary | ICD-10-CM | POA: Diagnosis not present

## 2020-10-08 ENCOUNTER — Ambulatory Visit (INDEPENDENT_AMBULATORY_CARE_PROVIDER_SITE_OTHER): Payer: Medicare Other | Admitting: "Endocrinology

## 2020-10-08 ENCOUNTER — Other Ambulatory Visit: Payer: Self-pay

## 2020-10-08 ENCOUNTER — Encounter: Payer: Self-pay | Admitting: "Endocrinology

## 2020-10-08 VITALS — BP 102/62 | HR 60 | Ht 70.0 in | Wt 215.0 lb

## 2020-10-08 DIAGNOSIS — I10 Essential (primary) hypertension: Secondary | ICD-10-CM

## 2020-10-08 DIAGNOSIS — E1165 Type 2 diabetes mellitus with hyperglycemia: Secondary | ICD-10-CM

## 2020-10-08 DIAGNOSIS — E782 Mixed hyperlipidemia: Secondary | ICD-10-CM | POA: Diagnosis not present

## 2020-10-08 DIAGNOSIS — E039 Hypothyroidism, unspecified: Secondary | ICD-10-CM | POA: Diagnosis not present

## 2020-10-08 LAB — POCT GLYCOSYLATED HEMOGLOBIN (HGB A1C): HbA1c, POC (controlled diabetic range): 8 % — AB (ref 0.0–7.0)

## 2020-10-08 LAB — HEMOGLOBIN A1C: Hemoglobin A1C: 8

## 2020-10-08 MED ORDER — LANTUS SOLOSTAR 100 UNIT/ML ~~LOC~~ SOPN: 42.0000 [IU] | PEN_INJECTOR | Freq: Every day | SUBCUTANEOUS | 1 refills | Status: DC

## 2020-10-08 NOTE — Progress Notes (Signed)
10/08/2020, 10:36 AM    Endocrinology follow-up note    Subjective:    Patient ID: Jonathan Hensley, male    DOB: 1941/01/01.  Jonathan Hensley is being seen in follow-up for the management of currently uncontrolled type 2 diabetes, hypothyroidism, hyperlipidemia, hypertension.  Previously seen by [name].  Past Medical History:  Diagnosis Date  . Arthritis    rheumatoid in his hands  . Atrial fibrillation ([place])   . Bulging lumbar disc   . Cancer ([place])   . DDD (degenerative disc disease), cervical   . Diabetes mellitus   . Diabetic retinopathy ([place])   . Double vision   . Dysrhythmia    Atrial Fibrillation  . History of prostate cancer   . Hypertension   . Low back pain   . Nephrolithiasis   . Sleep apnea    uses cpap    Past Surgical History:  Procedure Laterality Date  . APPENDECTOMY    . BACK SURGERY    . Bilateral L4-5 lumbar decompression including bilateral  11/2008  . brachytherapy    . COLONOSCOPY    . CYSTOSCOPY    . Left L4-L5 lumbar laminotomy and microdiskectomy with  07/14/2008    . LITHOTRIPSY    . LUMBAR LAMINECTOMY/DECOMPRESSION MICRODISCECTOMY Left 08/18/2013   Procedure: Left Lumbar Five-Sacral One Microdiskectomy;  Surgeon: [name], MD;  Location: [facility];  Service: Neurosurgery;  Laterality: Left;  Left Lumbar Five-Sacral One Microdiskectomy  . rotator cuff surgery    . Scleral buckle, laser photocoagulation, and anterior chamber  01/04/2009    Social History   Socioeconomic History  . Marital status: Married    Spouse name: [name]  . Number of children: 2  . Years of education: 37  . Highest education level: Not on file  Occupational History  . Not on file  Tobacco Use  . Smoking status: Former Smoker    Quit date: 02/12/1973    Years since quitting: 47.6  . Smokeless tobacco: Never Used  . Tobacco comment: starting at age 74  Vaping  Use  . Vaping Use: Never used  Substance and Sexual Activity  . Alcohol use: No  . Drug use: No  . Sexual activity: Yes  Other Topics Concern  . Not on file  Social History Narrative   Lives at home with wife   Drinks 4 cups of coffee a day   Social Determinants of Health   Financial Resource Strain: Not on file  Food Insecurity: Not on file  Transportation Needs: Not on file  Physical Activity: Not on file  Stress: Not on file  Social Connections: Not on file    Family History  Problem Relation Age of Onset  . Heart failure Father   . Heart failure Mother   . Heart failure Brother        Half brother.  . Colon cancer Neg Hx     Outpatient Encounter Medications as of 10/08/2020  Medication Sig  . levothyroxine (SYNTHROID) 50 MCG tablet Take 50 mcg by mouth daily before breakfast.  . apixaban (ELIQUIS) 5 MG  TABS tablet Take 1 tablet (5 mg total) by mouth 2 (two) times daily.  Marland Kitchen atorvastatin (LIPITOR) 10 MG tablet Take 10 mg by mouth daily.  . B-D ULTRAFINE III SHORT PEN 31G X 8 MM MISC USE 1 PEN NEEDLE AS DIRECTED  . Blood Glucose Monitoring Suppl (FREESTYLE FREEDOM LITE) w/Device KIT 1 each by Does not apply route 4 (four) times daily. USE BLOOD GLUCOSE MONITOR TO CHECK BLOOD SUGAR.  Marland Kitchen diltiazem (CARDIZEM CD) 240 MG 24 hr capsule Daily  . ezetimibe (ZETIA) 10 MG tablet TAKE 1 TABLET EVERY EVENING AFTER DINNER  . FREESTYLE LITE test strip USE TO CHECK BLOOD SUGAR FOUR TIMES A DAY AS INSTRUCTED  . GLIPIZIDE XL 5 MG 24 hr tablet TAKE 1 TABLET DAILY WITH BREAKFAST  . insulin glargine (LANTUS SOLOSTAR) 100 UNIT/ML Solostar Pen Inject 42 Units into the skin at bedtime.  . Lancets (FREESTYLE) lancets Use as instructed to check blood sugar 4 times per day dx code 250.00  . metoprolol succinate (TOPROL-XL) 25 MG 24 hr tablet TAKE 1 TABLET DAILY WITH OR IMMEDIATELY FOLLOWING A MEAL  . [DISCONTINUED] insulin glargine (LANTUS SOLOSTAR) 100 UNIT/ML Solostar Pen Inject 38 Units into the  skin at bedtime.  . [DISCONTINUED] levothyroxine (SYNTHROID) 25 MCG tablet Take 1 tablet (25 mcg total) by mouth daily before breakfast.   No facility-administered encounter medications on file as of 10/08/2020.    ALLERGIES: Allergies  Allergen Reactions  . Beta Adrenergic Blockers Other (See Comments)    Severe dizziness  . Other Other (See Comments)    Beta or alpha blockers: severe dizziness  . Tramadol Other (See Comments)    Dizziness     VACCINATION STATUS: Immunization History  Administered Date(s) Administered  . Influenza Split 02/12/2013  . Influenza, High Dose Seasonal PF 03/26/2018  . Influenza,inj,Quad PF,6+ Mos 02/26/2015  . Influenza-Unspecified 02/17/2014  . Pneumococcal Conjugate-13 11/26/2014  . Tdap 07/21/2012    Diabetes He presents for his follow-up diabetic visit. He has type 2 diabetes mellitus. Onset time: He was diagnosed at approximate age of 89 years. His disease course has been worsening. There are no hypoglycemic associated symptoms. Pertinent negatives for hypoglycemia include no confusion, headaches, pallor or seizures. Pertinent negatives for diabetes include no chest pain, no fatigue, no polydipsia, no polyphagia, no polyuria and no weakness. There are no hypoglycemic complications. Symptoms are worsening. Diabetic complications include nephropathy and retinopathy. Risk factors for coronary artery disease include dyslipidemia, family history, male sex, sedentary lifestyle and tobacco exposure. Current diabetic treatment includes insulin injections and oral agent (monotherapy) (He is currently on Lantus 36 units nightly, glipizide 5 mg daily. ). He is compliant with treatment most of the time. His weight is fluctuating minimally. He is following a generally unhealthy diet. When asked about meal planning, he reported none. He has not had a previous visit with a dietitian. He participates in exercise intermittently. His home blood glucose trend is increasing  steadily. His breakfast blood glucose range is generally 140-180 mg/dl. His bedtime blood glucose range is generally 180-200 mg/dl. His overall blood glucose range is 180-200 mg/dl. (His presents with fluctuating glycemic profile, near target fasting glycemic profile.  Significantly above target postprandial readings.  His point-of-care A1c is 8%, increasing from 7.7%.    He did not document any hypoglycemia.   ) An ACE inhibitor/angiotensin II receptor blocker is being taken. Eye exam is current.  Hyperlipidemia This is a chronic problem. The current episode started more than 1 year ago. The  problem is controlled. Exacerbating diseases include diabetes and obesity. Pertinent negatives include no chest pain, myalgias or shortness of breath. Current antihyperlipidemic treatment includes statins. Risk factors for coronary artery disease include diabetes mellitus, dyslipidemia, hypertension, male sex and a sedentary lifestyle.  Hypertension This is a chronic problem. The current episode started more than 1 year ago. Pertinent negatives include no chest pain, headaches, neck pain, palpitations or shortness of breath. Risk factors for coronary artery disease include dyslipidemia, diabetes mellitus, male gender, obesity, sedentary lifestyle and smoking/tobacco exposure. Past treatments include ACE inhibitors. Hypertensive end-organ damage includes retinopathy.     Review of systems  Constitutional: + Minimally fluctuating body weight,  current  Body mass index is 30.85 kg/m. , no fatigue, no subjective hyperthermia, no subjective hypothermia     Objective:    BP 102/62   Pulse 60   Ht _0  (1.778 m)   Wt 215 lb (97.5 kg)   BMI 30.85 kg/m   Wt Readings from Last 3 Encounters:  10/08/20 215 lb (97.5 kg)  03/31/20 217 lb 9.6 oz (98.7 kg)  02/16/20 213 lb (96.6 kg)    Physical Exam- Limited  Constitutional:  Body mass index is 30.85 kg/m. , not in acute distress, normal state of  mind    CMP     Component Value Date/Time   NA 143 10/01/2020 0804   K 4.5 10/01/2020 0804   CL 106 10/01/2020 0804   CO2 21 10/01/2020 0804   GLUCOSE 176 (H) 10/01/2020 0804   GLUCOSE 170 (H) 03/25/2020 0752   BUN 26 10/01/2020 0804   CREATININE 1.20 10/01/2020 0804   CREATININE 1.50 (H) 03/25/2020 0752   CALCIUM 9.5 10/01/2020 0804   PROT 6.9 10/01/2020 0804   ALBUMIN 4.1 10/01/2020 0804   AST 16 10/01/2020 0804   ALT 15 10/01/2020 0804   ALKPHOS 92 10/01/2020 0804   BILITOT 0.2 10/01/2020 0804   GFRNONAA 44 (L) 03/25/2020 0752   GFRAA 51 (L) 03/25/2020 0752     Diabetic Labs (most recent): Lab Results  Component Value Date   HGBA1C 7.7 (A) 03/31/2020   HGBA1C 7.5 (A) 09/24/2019   HGBA1C 9.1 (A) 06/26/2019     Lipid Panel ( most recent) Lipid Panel     Component Value Date/Time   CHOL 143 02/11/2020 1000   TRIG 62 02/11/2020 1000   HDL 52 02/11/2020 1000   CHOLHDL 2.8 02/11/2020 1000   VLDL 12 02/11/2020 1000   LDLCALC 79 02/11/2020 1000   LDLCALC 130 (H) 09/16/2019 0809      Lab Results  Component Value Date   TSH 5.410 (H) 10/01/2020   TSH 5.54 (H) 03/25/2020   TSH 3.70 09/16/2019   TSH 3.05 01/14/2019   TSH 4.80 (H) 09/30/2018   TSH 3.27 12/21/2017   TSH 2.60 06/11/2017   TSH 2.36 05/22/2016   TSH 2.18 06/16/2015   TSH 2.370 12/19/2011   FREET4 1.15 10/01/2020   FREET4 1.0 03/25/2020   FREET4 1.0 09/16/2019   FREET4 1.2 01/14/2019   FREET4 0.9 09/30/2018   FREET4 0.92 06/11/2017   FREET4 0.95 05/22/2016   FREET4 0.92 06/16/2015      Assessment & Plan:   1. Uncontrolled type 2 diabetes mellitus with hyperglycemia ([place])  - Jonathan Hensley has currently uncontrolled symptomatic type 2 DM since  80 years of age. His presents with fluctuating glycemic profile, near target fasting glycemic profile.  Significantly above target postprandial readings.  His point-of-care A1c is 8%, increasing  from 7.7%.   He did not document any hypoglycemia.     -his diabetes is complicated by retinopathy and he remains at a high risk for more acute and chronic complications which include CAD, CVA, CKD, retinopathy, and neuropathy. These are all discussed in detail with him.  - I have counseled him on diet management and weight loss, by adopting a carbohydrate restricted/protein rich diet.  - he acknowledges that there is a room for improvement in his food and drink choices. - Suggestion is made for him to avoid simple carbohydrates  from his diet including Cakes, Sweet Desserts, Ice Cream, Soda (diet and regular), Sweet Tea, Candies, Chips, Cookies, Store Bought Juices, Alcohol in Excess of  1-2 drinks a day, Artificial Sweeteners,  Coffee Creamer, and "Sugar-free" Products, Lemonade. This will help patient to have more stable blood glucose profile and potentially avoid unintended weight gain.   - I encouraged him to switch to  unprocessed or minimally processed complex starch and increased protein intake (animal or plant source), fruits, and vegetables.  - he is advised to stick to a routine mealtimes to eat 3 meals  a day and avoid unnecessary snacks ( to snack only to correct hypoglycemia).   - I have approached him with the following individualized plan to manage diabetes and patient agrees:   -Given  his presentation with near target glycemic profile, he will not need prandial insulin for now.  Besides, he is unable to execute in complicated insulin regimen.  -He has benefited and will continue to benefit from basal insulin.  He is being assisted by his wife.  He is advised to increase Lantus to 42 units nightly,     associated with monitoring of blood glucose twice a day-daily before breakfast and at bedtime.  -He has benefited from low-dose glipizide.  He is advised to continue  glipizide 5 mg XL p.o. daily at breakfast.    -He has worsening renal function, advised to stay off of Metformin.  he will be considered for incretin therapy as  appropriate next visit.  - Patient specific target  A1c;  LDL, HDL, Triglycerides,  were discussed in detail.  2) Blood Pressure /Hypertension: -His blood pressure is controlled to target.  he is advised to continue his current medications including benazepril 40 mg p.o. daily with breakfast.  3) Lipids/Hyperlipidemia:   Review of his recent lipid panel showed increased LDL at 79 improving from 130.  He is asked to  continue simvastatin 40 mg p.o. daily at bedtime.   Also on Zetia 10 mg p.o. nightly.     Side effects and precautions discussed with him.  4)  Weight/Diet: His BMI is 30.8     He is a candidate for modest weight loss.  I discussed with him the fact that loss of 5 - 10% of his  current body weight will have the most impact on his diabetes management.  CDE Consult will be initiated . Exercise, and detailed carbohydrates information provided  -  detailed on discharge instructions.   5) Hypothyroidism:   -He is now taking levothyroxine.  His previsit labs are still consistent with under replacement.  I discussed and increase his Synthroid to 50 mcg daily before breakfast.     - We discussed about the correct intake of his thyroid hormone, on empty stomach at fasting, with water, separated by at least 30 minutes from breakfast and other medications,  and separated by more than 4 hours from calcium, iron, multivitamins, acid  reflux medications (PPIs). -Patient is made aware of the fact that thyroid hormone replacement is needed for life, dose to be adjusted by periodic monitoring of thyroid function tests.   6) Chronic Care/Health Maintenance:  -he  is on ACEI/ARB and Statin medications and  is encouraged to initiate and continue to follow up with Ophthalmology, Dentist,  Podiatrist at least yearly or according to recommendations, and advised to   stay away from smoking. I have recommended yearly flu vaccine and pneumonia vaccine at least every 5 years; moderate intensity exercise for  up to 150 minutes weekly; and  sleep for at least 7 hours a day.   - he is  advised to maintain close follow up with his PMD  for primary care needs, as well as his other providers for optimal and coordinated care.   I spent 41 minutes in the care of the patient today including review of labs from [place], Lipids, Thyroid Function, Hematology (current and previous including abstractions from other facilities); face-to-face time discussing  his blood glucose readings/logs, discussing hypoglycemia and hyperglycemia episodes and symptoms, medications doses, his options of short and long term treatment based on the latest standards of care / guidelines;  discussion about incorporating lifestyle medicine;  and documenting the encounter.    Please refer to Patient Instructions for Blood Glucose Monitoring and Insulin/Medications Dosing Guide"  in media tab for additional information. Please  also refer to " Patient Self Inventory" in the Media  tab for reviewed elements of pertinent patient history.  Jonathan Hensley participated in the discussions, expressed understanding, and voiced agreement with the above plans.  All questions were answered to his satisfaction. he is encouraged to contact clinic should he have any questions or concerns prior to his return visit.   Follow up plan: - Return in about 6 months (around 04/10/2021) for F/U with Pre-visit Labs, Meter, Logs, A1c here.Glade Lloyd, MD [facility] [address] [place], [place] [address] Phone: [phone]  Fax: [phone]    10/08/2020, 10:36 AM  This note was partially dictated with voice recognition software. Similar sounding words can be transcribed inadequately or may not  be corrected upon review.

## 2020-10-08 NOTE — Patient Instructions (Signed)
                                     Advice for Weight Management  -For most of us the best way to lose weight is by diet management. Generally speaking, diet management means consuming less calories intentionally which over time brings about progressive weight loss.  This can be achieved more effectively by restricting carbohydrate consumption to the minimum possible.  So, it is critically important to know your numbers: how much calorie you are consuming and how much calorie you need. More importantly, our carbohydrates sources should be unprocessed or minimally processed complex starch food items.   Sometimes, it is important to balance nutrition by increasing protein intake (animal or plant source), fruits, and vegetables.  -Sticking to a routine mealtime to eat 3 meals a day and avoiding unnecessary snacks is shown to have a big role in weight control. Under normal circumstances, the only time we lose real weight is when we are hungry, so allow hunger to take place- hunger means no food between meal times, only water.  It is not advisable to starve.   -It is better to avoid simple carbohydrates including: Cakes, Sweet Desserts, Ice Cream, Soda (diet and regular), Sweet Tea, Candies, Chips, Cookies, Store Bought Juices, Alcohol in Excess of  1-2 drinks a day, Lemonade,  Artificial Sweeteners, Doughnuts, Coffee Creamers, "Sugar-free" Products, etc, etc.  This is not a complete list.....    -Consulting with certified diabetes educators is proven to provide you with the most accurate and current information on diet.  Also, you may be  interested in discussing diet options/exchanges , we can schedule a visit with Jonathan Hensley, RDN, CDE for individualized nutrition education.  -Exercise: If you are able: 30 -60 minutes a day ,4 days a week, or 150 minutes a week.  The longer the better.  Combine stretch, strength, and aerobic activities.  If you were told in the  past that you have high risk for cardiovascular diseases, you may seek evaluation by your heart doctor prior to initiating moderate to intense exercise programs.                                  Additional Care Considerations for Diabetes   -Diabetes  is a chronic disease.  The most important care consideration is regular follow-up with your diabetes care provider with the goal being avoiding or delaying its complications and to take advantage of advances in medications and technology.    -Type 2 diabetes is known to coexist with other important comorbidities such as high blood pressure and high cholesterol.  It is critical to control not only the diabetes but also the high blood pressure and high cholesterol to minimize and delay the risk of complications including coronary artery disease, stroke, amputations, blindness, etc.    - Studies showed that people with diabetes will benefit from a class of medications known as ACE inhibitors and statins.  Unless there are specific reasons not to be on these medications, the standard of care is to consider getting one from these groups of medications at an optimal doses.  These medications are generally considered safe and proven to help protect the heart and the kidneys.    - People with diabetes are encouraged to initiate and maintain regular follow-up with eye doctors, foot   doctors, dentists , and if necessary heart and kidney doctors.     - It is highly recommended that people with diabetes quit smoking or stay away from smoking, and get yearly  flu vaccine and pneumonia vaccine at least every 5 years.  One other important lifestyle recommendation is to ensure adequate sleep - at least 6-7 hours of uninterrupted sleep at night.  -Exercise: If you are able: 30 -60 minutes a day, 4 days a week, or 150 minutes a week.  The longer the better.  Combine stretch, strength, and aerobic activities.  If you were told in the past that you have high risk for  cardiovascular diseases, you may seek evaluation by your heart doctor prior to initiating moderate to intense exercise programs.          

## 2020-10-12 ENCOUNTER — Telehealth: Payer: Self-pay | Admitting: "Endocrinology

## 2020-10-12 NOTE — Telephone Encounter (Signed)
Discussed with pt's wife, understanding voiced. 

## 2020-10-12 NOTE — Telephone Encounter (Signed)
Ok, they can lower it back to 38 units, call back  if he still gets below 70.

## 2020-10-12 NOTE — Telephone Encounter (Signed)
Pt wife [name] is calling and states that Dr Dorris Fetch increased his insulin to 42 units from 38 units. She states that his sugar was running higher with the 38 units because he was checking his sugar after breakfast instead of before. Since changing on the 42 units Saturday morning it was 78, following morning 51, then 62. They feel it needs to be lowered back to 38 units. Please advise.   [phone]

## 2020-10-18 ENCOUNTER — Ambulatory Visit: Payer: Medicare Other

## 2020-10-18 ENCOUNTER — Other Ambulatory Visit: Payer: Self-pay

## 2020-10-18 DIAGNOSIS — I428 Other cardiomyopathies: Secondary | ICD-10-CM

## 2020-10-18 DIAGNOSIS — I4819 Other persistent atrial fibrillation: Secondary | ICD-10-CM | POA: Diagnosis not present

## 2020-10-24 NOTE — Progress Notes (Signed)
Echocardiogram 10/18/2020: Normal LV systolic function with visual EF 55-60%. Left ventricle cavity is normal in size. Normal global wall motion. Normal diastolic filling pattern, normal LAP. Left atrial cavity is mildly dilated. Mild (Grade I) mitral regurgitation. Mild tricuspid regurgitation. No evidence of pulmonary hypertension. RVSP measures 34 mmHg. Compared to study dated 01/26/2020: LVEF improved from 45-50% to 55-60%, LAE was severely dilated and now mild LAE.  He is seeing you soon

## 2020-10-25 NOTE — Progress Notes (Signed)
Primary Physician:  Patient, No Pcp Per (Inactive)   Patient ID: Jonathan Hensley, male    DOB: June 16, 1940, 80 y.o.   MRN: [id number]  Subjective:    Chief Complaint  Patient presents with   Atrial Fibrillation   Cardiomyopathy   Follow-up    HPI: Jonathan Hensley  is a 80 y.o. male  with history of diabetes mellitus uncontrolled, hypertension and hyperlipidemia, nonischemic cardiomyopathy, paroxysmal atrial fibrillation. He has double vision and prism colors due to h/o retinal detachment (chronic) but has also developed diabetic retinopathy left eye, chronic vertigo. He has sleep apnea and uses CPAP on a regular basis.  Patient presents for 88-monthfollow-up of nonischemic cardiomyopathy, atrial fibrillation, and hypertension.  At last visit added metoprolol succinate 25 mg once daily.  Repeat echocardiogram since improved heart rate control revealed LVEF improved from 45-50% to 55-60% and LA improved from severely to mildly dilated.  Patient reports he is doing relatively well overall without specific complaints today.  Denies chest pain, palpitations, dyspnea, dizziness, syncope, near syncope.  Denies fatigue, orthopnea, leg swelling.  Notably patient has difficulty hearing.  Also unfortunately he did not bring home blood pressure readings for review.  Past Medical History:  Diagnosis Date   Arthritis    rheumatoid in his hands   Atrial fibrillation (HCC)    Bulging lumbar disc    Cancer ([facility]    DDD (degenerative disc disease), cervical    Diabetes mellitus    Diabetic retinopathy ([facility]    Double vision    Dysrhythmia    Atrial Fibrillation   History of prostate cancer    Hypertension    Low back pain    Nephrolithiasis    Sleep apnea    uses cpap    Past Surgical History:  Procedure Laterality Date   APPENDECTOMY     BACK SURGERY     Bilateral L4-5 lumbar decompression including bilateral  11/2008   brachytherapy     COLONOSCOPY     CYSTOSCOPY     Left L4-L5 lumbar  laminotomy and microdiskectomy with  07/14/2008     LITHOTRIPSY     LUMBAR LAMINECTOMY/DECOMPRESSION MICRODISCECTOMY Left 08/18/2013   Procedure: Left Lumbar Five-Sacral One Microdiskectomy;  Surgeon: RHosie Spangle MD;  Location: [facility];  Service: Neurosurgery;  Laterality: Left;  Left Lumbar Five-Sacral One Microdiskectomy   rotator cuff surgery     Scleral buckle, laser photocoagulation, and anterior chamber  01/04/2009   Family History  Problem Relation Age of Onset   Heart failure Father    Heart failure Mother    Heart failure Brother        H63brother.   Colon cancer Neg Hx     Social History   Tobacco Use   Smoking status: Former    Pack years: 0.00    Types: Cigarettes    Quit date: 02/12/1973    Years since quitting: 47.7   Smokeless tobacco: Never   Tobacco comments:    starting at age 80 Substance Use Topics   Alcohol use: No   Marital Status: Married   ROS   Review of Systems  Constitutional: Negative for malaise/fatigue and weight gain.  Cardiovascular:  Negative for chest pain, claudication, dyspnea on exertion, leg swelling, near-syncope, orthopnea, palpitations, paroxysmal nocturnal dyspnea and syncope.  Respiratory:  Negative for shortness of breath.   Musculoskeletal:  Positive for arthritis and back pain.  Gastrointestinal:  Negative for melena.  Neurological:  Negative for  dizziness.  Psychiatric/Behavioral:  Positive for memory loss.    Objective:  Blood pressure 138/76, pulse 65, temperature 98.4 F (36.9 C), height 5' 10"  (1.778 m), weight 216 lb (98 kg), SpO2 97 %. Body mass index is 30.99 kg/m.  Vitals with BMI 10/26/2020 10/08/2020 03/31/2020  Height 5' 10"  5' 10"  5' 10"   Weight 216 lbs 215 lbs 217 lbs 10 oz  BMI 30.99 11.73 56.70  Systolic 141 030 131  Diastolic 76 62 76  Pulse 65 60 56    Physical Exam Constitutional:      General: He is not in acute distress.    Appearance: He is well-developed.  HENT:     Head:  Atraumatic.  Eyes:     Conjunctiva/sclera: Conjunctivae normal.  Neck:     Thyroid: No thyromegaly.     Vascular: No JVD.  Cardiovascular:     Rate and Rhythm: Tachycardia present. Rhythm irregularly irregular.     Pulses: Normal pulses and intact distal pulses.     Heart sounds: No murmur heard.   No gallop. No S3 or S4 sounds.     Comments: S1 is variable, S2 is normal.  Pulmonary:     Effort: Pulmonary effort is normal.     Breath sounds: Normal breath sounds.  Abdominal:     General: Bowel sounds are normal.     Palpations: Abdomen is soft.  Musculoskeletal:        General: Normal range of motion.     Cervical back: Neck supple.  Skin:    General: Skin is warm and dry.  Neurological:     Mental Status: He is alert.    CMP Latest Ref Rng & Units 10/01/2020 03/25/2020 09/16/2019  Glucose 65 - 99 mg/dL 176(H) 170(H) 98  BUN 8 - 27 mg/dL 26 41(H) 37(H)  Creatinine 0.76 - 1.27 mg/dL 1.20 1.50(H) 1.23(H)  Sodium 134 - 144 mmol/L 143 141 140  Potassium 3.5 - 5.2 mmol/L 4.5 4.5 4.9  Chloride 96 - 106 mmol/L 106 105 106  CO2 20 - 29 mmol/L 21 28 28   Calcium 8.6 - 10.2 mg/dL 9.5 9.4 9.7  Total Protein 6.0 - 8.5 g/dL 6.9 6.9 6.8  Total Bilirubin 0.0 - 1.2 mg/dL 0.2 0.3 0.4  Alkaline Phos 44 - 121 IU/L 92 - -  AST 0 - 40 IU/L 16 14 14   ALT 0 - 44 IU/L 15 12 10    CBC Latest Ref Rng & Units 05/16/2019 06/18/2018 03/26/2018  WBC 4.0 - 10.5 K/uL 3.9(L) 6.4 7.5  Hemoglobin 13.0 - 17.0 g/dL 12.9(L) 12.0(L) 12.2(L)  Hematocrit 39.0 - 52.0 % 38.9(L) 36.2(L) 36.9(L)  Platelets 150 - 400 K/uL 208 219.0 249.0   Lipid Panel Recent Labs    02/11/20 1000  CHOL 143  TRIG 62  LDLCALC 79  VLDL 12  HDL 52  CHOLHDL 2.8    HEMOGLOBIN A1C Lab Results  Component Value Date   HGBA1C 8.0 (A) 10/08/2020   MPG 206 01/14/2019   TSH Recent Labs    03/25/20 0752 10/01/20 0804  TSH 5.54* 5.410*   Allergies   Allergies  Allergen Reactions   Beta Adrenergic Blockers Other (See Comments)     Severe dizziness   Other Other (See Comments)    Beta or alpha blockers: severe dizziness   Tramadol Other (See Comments)    Dizziness       Medications Prior to Visit:   Outpatient Medications Prior to Visit  Medication Sig Dispense Refill  apixaban (ELIQUIS) 5 MG TABS tablet Take 1 tablet (5 mg total) by mouth 2 (two) times daily. 180 tablet 3   atorvastatin (LIPITOR) 10 MG tablet Take 10 mg by mouth daily.     B-D ULTRAFINE III SHORT PEN 31G X 8 MM MISC USE 1 PEN NEEDLE AS DIRECTED 100 each 3   Blood Glucose Monitoring Suppl (FREESTYLE FREEDOM LITE) w/Device KIT 1 each by Does not apply route 4 (four) times daily. USE BLOOD GLUCOSE MONITOR TO CHECK BLOOD SUGAR. 1 each 0   cyclobenzaprine (FLEXERIL) 5 MG tablet 1 tablet as needed     ezetimibe (ZETIA) 10 MG tablet TAKE 1 TABLET EVERY EVENING AFTER DINNER 90 tablet 3   FREESTYLE LITE test strip USE TO CHECK BLOOD SUGAR FOUR TIMES A DAY AS INSTRUCTED 400 each 1   GLIPIZIDE XL 5 MG 24 hr tablet TAKE 1 TABLET DAILY WITH BREAKFAST 90 tablet 0   insulin glargine (LANTUS SOLOSTAR) 100 UNIT/ML Solostar Pen Inject 42 Units into the skin at bedtime. (Patient taking differently: Inject 38 Units into the skin at bedtime.) 30 mL 1   levothyroxine (SYNTHROID) 50 MCG tablet Take 50 mcg by mouth daily before breakfast.     metoprolol succinate (TOPROL-XL) 25 MG 24 hr tablet TAKE 1 TABLET DAILY WITH OR IMMEDIATELY FOLLOWING A MEAL 90 tablet 3   levothyroxine (SYNTHROID) 50 MCG tablet      memantine (NAMENDA) 10 MG tablet      simvastatin (ZOCOR) 20 MG tablet      terbinafine (LAMISIL) 250 MG tablet      diltiazem (CARDIZEM CD) 240 MG 24 hr capsule Daily 90 capsule 3   Lancets (FREESTYLE) lancets Use as instructed to check blood sugar 4 times per day dx code 250.00 400 each 3   atorvastatin (LIPITOR) 10 MG tablet 1 tablet     diltiazem (CARDIZEM) 60 MG tablet See admin instructions.     Metoprolol Succinate 25 MG CS24 1 capsule     No  facility-administered medications prior to visit.     Final Medications at End of Visit    Current Meds  Medication Sig   apixaban (ELIQUIS) 5 MG TABS tablet Take 1 tablet (5 mg total) by mouth 2 (two) times daily.   atorvastatin (LIPITOR) 10 MG tablet Take 10 mg by mouth daily.   B-D ULTRAFINE III SHORT PEN 31G X 8 MM MISC USE 1 PEN NEEDLE AS DIRECTED   Blood Glucose Monitoring Suppl (FREESTYLE FREEDOM LITE) w/Device KIT 1 each by Does not apply route 4 (four) times daily. USE BLOOD GLUCOSE MONITOR TO CHECK BLOOD SUGAR.   cyclobenzaprine (FLEXERIL) 5 MG tablet 1 tablet as needed   ezetimibe (ZETIA) 10 MG tablet TAKE 1 TABLET EVERY EVENING AFTER DINNER   FREESTYLE LITE test strip USE TO CHECK BLOOD SUGAR FOUR TIMES A DAY AS INSTRUCTED   GLIPIZIDE XL 5 MG 24 hr tablet TAKE 1 TABLET DAILY WITH BREAKFAST   insulin glargine (LANTUS SOLOSTAR) 100 UNIT/ML Solostar Pen Inject 42 Units into the skin at bedtime. (Patient taking differently: Inject 38 Units into the skin at bedtime.)   levothyroxine (SYNTHROID) 50 MCG tablet Take 50 mcg by mouth daily before breakfast.   metoprolol succinate (TOPROL-XL) 25 MG 24 hr tablet TAKE 1 TABLET DAILY WITH OR IMMEDIATELY FOLLOWING A MEAL   [DISCONTINUED] levothyroxine (SYNTHROID) 50 MCG tablet    [DISCONTINUED] memantine (NAMENDA) 10 MG tablet    [DISCONTINUED] simvastatin (ZOCOR) 20 MG tablet    [DISCONTINUED] terbinafine (  LAMISIL) 250 MG tablet       Radiology   No results found.  Cardiac Studies:   Holter Monitor 12/26/11: Brief A. fibrillation with RVR . NO symptoms reported. Paroxysmal epidoses. ED visit 06/14/2016: A. Fib with RVR.  Lexiscan Tetrofosmin Stress Test  02/09/2020: Nondiagnostic ECG stress. Resting EKG demonstrated normal sinus rhythm, occasional premature atrial contractions. Peak EKG, no ST-T wave abnormalities. During infusion the peak ECG revealed brief atrial tachycardia and premature atrial contractions. There is a fixed mild  defect in inferior region consistent with diaphragmatic attenuation. Superimposed on this, there is a partially reversible small sized mild defect in the inferior region.  Overall LV systolic function is normal without regional wall motion abnormalities. Stress LV EF: 58%.  Compared to 02/23/12, inferior defect is new. Low risk  Echocardiogram 10/18/2020:  Normal LV systolic function with visual EF 55-60%. Left ventricle cavity  is normal in size. Normal global wall motion. Normal diastolic filling  pattern, normal LAP.  Left atrial cavity is mildly dilated.  Mild (Grade I) mitral regurgitation.  Mild tricuspid regurgitation. No evidence of pulmonary hypertension. RVSP  measures 34 mmHg.  Compared to study dated 01/26/2020: LVEF improved from 45-50% to 55-60%,  LAE was severely dilated and now mild LAE.    EKG:   10/26/2020: Sinus rhythm with frequent PACs at a rate of 67 bpm.  Normal axis.  No evidence of ischemia or underlying injury pattern.  EKG 02/16/2020: Atrial fibrillation with controlled ventricular response at rate of 98 bpm, normal axis, nonspecific T abnormality.     EKG 01/09/2020: Atrial fibrillation with rapid ventricular response at the rate of 120 bpm, normal axis, nonspecific T abnormality.  Compared to April 2020, heart rate was 98 bpm.  Patient previously was in atrial flutter.  Assessment:     ICD-10-CM   1. Non-ischemic cardiomyopathy (HCC)  I42.8 EKG 12-Lead    2. Persistent atrial fibrillation (HCC)  I48.19 EKG 12-Lead    3. Hypercholesteremia  E78.00      Medications Discontinued During This Encounter  Medication Reason   simvastatin (ZOCOR) 20 MG tablet Change in therapy   atorvastatin (LIPITOR) 10 MG tablet Duplicate   levothyroxine (SYNTHROID) 50 MCG tablet Error   memantine (NAMENDA) 10 MG tablet Error   terbinafine (LAMISIL) 250 MG tablet Error   diltiazem (CARDIZEM) 60 MG tablet Patient has not taken in last 30 days   Metoprolol Succinate 25 MG  DU20 Duplicate    No orders of the defined types were placed in this encounter.   CHA2DS2-VASc Score is 4.  Yearly risk of stroke: 4.8% (A, HTN, DM).  Score of 1=0.6; 2=2.2; 3=3.2; 4=4.8; 5=7.2; 6=9.8; 7=>9.8) -(CHF; HTN; vasc disease DM,  Male = 1; Age <65 =0; 65-74 = 1,  >75 =2; stroke/embolism= 2).    Recommendations:   Jonathan Hensley  is a 80 y.o. with history of diabetes mellitus uncontrolled, hypertension and hyperlipidemia, nonischemic cardiomyopathy, paroxysmal atrial fibrillation. He has double vision and prism colors due to h/o retinal detachment (chronic) but has also developed diabetic retinopathy left eye, chronic vertigo. He has sleep apnea and uses CPAP on a regular basis.  Patient presents for 80-monthfollow-up of nonischemic cardiomyopathy, atrial fibrillation, and hypertension.  At last visit added metoprolol succinate 25 mg once daily.  Repeat echocardiogram since improved heart rate control revealed LVEF improved from 45-50% to 55-60% and LA improved from severely to mildly dilated.  Reviewed and discussed with patient regarding echocardiogram results, details above.  Patient is feeling relatively well without specific complaints today.  He is presently in sinus rhythm and there are no clinical signs of heart failure.  He is tolerating anticoagulation without bleeding diathesis.  Given CHA2DS2-VASc score of 4 recommend continuing Eliquis.  Patient is tolerating addition of metoprolol succinate without issue.  Blood pressure is relatively well controlled. Will not make changes to medications at this time.   Cardiomyopathy likely non-ischemic and rather due to atrial fibrillation as it has improved with rate control. Although stress test showed mild ischemia patient is on good medical therapy and asymptomatic, would not pursue cardiac catheterization at this time.   Follow up in 6 months, sooner if needed, for atrial fibrillation, cardiomyopathy, hypertension, and  hyperlipidemia.   Alethia Berthold, PA-C 10/29/2020, 3:21 PM Office: [phone]

## 2020-10-26 ENCOUNTER — Ambulatory Visit: Payer: Medicare Other | Admitting: Student

## 2020-10-26 ENCOUNTER — Encounter: Payer: Self-pay | Admitting: Student

## 2020-10-26 ENCOUNTER — Other Ambulatory Visit: Payer: Self-pay

## 2020-10-26 VITALS — BP 138/76 | HR 65 | Temp 98.4°F | Ht 70.0 in | Wt 216.0 lb

## 2020-10-26 DIAGNOSIS — I428 Other cardiomyopathies: Secondary | ICD-10-CM | POA: Diagnosis not present

## 2020-10-26 DIAGNOSIS — I4819 Other persistent atrial fibrillation: Secondary | ICD-10-CM

## 2020-10-26 DIAGNOSIS — E7800 Pure hypercholesterolemia, unspecified: Secondary | ICD-10-CM | POA: Diagnosis not present

## 2020-11-12 ENCOUNTER — Other Ambulatory Visit: Payer: Self-pay | Admitting: "Endocrinology

## 2020-12-17 ENCOUNTER — Other Ambulatory Visit: Payer: Self-pay

## 2020-12-17 MED ORDER — ATORVASTATIN CALCIUM 10 MG PO TABS: 10.0000 mg | ORAL_TABLET | Freq: Every day | ORAL | 0 refills | Status: DC

## 2020-12-31 DIAGNOSIS — B351 Tinea unguium: Secondary | ICD-10-CM | POA: Diagnosis not present

## 2020-12-31 DIAGNOSIS — E1151 Type 2 diabetes mellitus with diabetic peripheral angiopathy without gangrene: Secondary | ICD-10-CM | POA: Diagnosis not present

## 2021-01-10 ENCOUNTER — Other Ambulatory Visit: Payer: Self-pay | Admitting: "Endocrinology

## 2021-02-20 ENCOUNTER — Other Ambulatory Visit: Payer: Self-pay | Admitting: "Endocrinology

## 2021-02-20 ENCOUNTER — Other Ambulatory Visit: Payer: Self-pay | Admitting: Cardiology

## 2021-02-20 DIAGNOSIS — I4819 Other persistent atrial fibrillation: Secondary | ICD-10-CM

## 2021-02-25 ENCOUNTER — Other Ambulatory Visit: Payer: Self-pay | Admitting: "Endocrinology

## 2021-02-25 DIAGNOSIS — E1165 Type 2 diabetes mellitus with hyperglycemia: Secondary | ICD-10-CM

## 2021-03-09 DIAGNOSIS — H338 Other retinal detachments: Secondary | ICD-10-CM | POA: Diagnosis not present

## 2021-03-09 DIAGNOSIS — H524 Presbyopia: Secondary | ICD-10-CM | POA: Diagnosis not present

## 2021-03-09 DIAGNOSIS — E119 Type 2 diabetes mellitus without complications: Secondary | ICD-10-CM | POA: Diagnosis not present

## 2021-03-11 DIAGNOSIS — L84 Corns and callosities: Secondary | ICD-10-CM | POA: Diagnosis not present

## 2021-03-11 DIAGNOSIS — B351 Tinea unguium: Secondary | ICD-10-CM | POA: Diagnosis not present

## 2021-03-11 DIAGNOSIS — E1151 Type 2 diabetes mellitus with diabetic peripheral angiopathy without gangrene: Secondary | ICD-10-CM | POA: Diagnosis not present

## 2021-03-17 ENCOUNTER — Other Ambulatory Visit: Payer: Self-pay | Admitting: Student

## 2021-05-09 ENCOUNTER — Other Ambulatory Visit: Payer: Self-pay | Admitting: Cardiology

## 2021-05-12 ENCOUNTER — Other Ambulatory Visit: Payer: Self-pay | Admitting: "Endocrinology

## 2021-05-26 ENCOUNTER — Ambulatory Visit: Payer: Medicare Other | Admitting: "Endocrinology

## 2021-06-03 LAB — LIPID PANEL
Chol/HDL Ratio: 2.6 ratio (ref 0.0–5.0)
Cholesterol, Total: 144 mg/dL (ref 100–199)
HDL: 56 mg/dL (ref >39)
LDL Chol Calc (NIH): 76 mg/dL (ref 0–99)
Triglycerides: 60 mg/dL (ref 0–149)
VLDL Cholesterol Cal: 12 mg/dL (ref 5–40)

## 2021-06-03 LAB — COMPREHENSIVE METABOLIC PANEL
ALT: 13 IU/L (ref 0–44)
AST: 17 IU/L (ref 0–40)
Albumin/Globulin Ratio: 1.4 (ref 1.2–2.2)
Albumin: 4.1 g/dL (ref 3.7–4.7)
Alkaline Phosphatase: 86 IU/L (ref 44–121)
BUN/Creatinine Ratio: 23 (ref 10–24)
BUN: 29 mg/dL — ABNORMAL HIGH (ref 8–27)
Bilirubin Total: 0.3 mg/dL (ref 0.0–1.2)
CO2: 23 mmol/L (ref 20–29)
Calcium: 9.4 mg/dL (ref 8.6–10.2)
Chloride: 106 mmol/L (ref 96–106)
Creatinine, Ser: 1.28 mg/dL — ABNORMAL HIGH (ref 0.76–1.27)
Globulin, Total: 3 g/dL (ref 1.5–4.5)
Glucose: 73 mg/dL (ref 70–99)
Potassium: 4.5 mmol/L (ref 3.5–5.2)
Sodium: 144 mmol/L (ref 134–144)
Total Protein: 7.1 g/dL (ref 6.0–8.5)
eGFR: 57 mL/min/{1.73_m2} — ABNORMAL LOW (ref >59)

## 2021-06-03 LAB — TSH: TSH: 3.87 u[IU]/mL (ref 0.450–4.500)

## 2021-06-03 LAB — T4, FREE: Free T4: 1.24 ng/dL (ref 0.82–1.77)

## 2021-06-09 ENCOUNTER — Ambulatory Visit: Payer: Medicare Other | Admitting: "Endocrinology

## 2021-06-09 ENCOUNTER — Encounter: Payer: Self-pay | Admitting: "Endocrinology

## 2021-06-09 ENCOUNTER — Ambulatory Visit (INDEPENDENT_AMBULATORY_CARE_PROVIDER_SITE_OTHER): Payer: Medicare Other | Admitting: "Endocrinology

## 2021-06-09 ENCOUNTER — Other Ambulatory Visit: Payer: Self-pay

## 2021-06-09 VITALS — BP 128/74 | HR 68 | Ht 70.0 in | Wt 217.6 lb

## 2021-06-09 DIAGNOSIS — E039 Hypothyroidism, unspecified: Secondary | ICD-10-CM | POA: Diagnosis not present

## 2021-06-09 DIAGNOSIS — E782 Mixed hyperlipidemia: Secondary | ICD-10-CM

## 2021-06-09 DIAGNOSIS — I10 Essential (primary) hypertension: Secondary | ICD-10-CM | POA: Diagnosis not present

## 2021-06-09 DIAGNOSIS — E1165 Type 2 diabetes mellitus with hyperglycemia: Secondary | ICD-10-CM | POA: Diagnosis not present

## 2021-06-09 LAB — POCT GLYCOSYLATED HEMOGLOBIN (HGB A1C): HbA1c, POC (controlled diabetic range): 8.6 % — AB (ref 0.0–7.0)

## 2021-06-09 MED ORDER — FREESTYLE LITE TEST VI STRP: ORAL_STRIP | 1 refills | Status: DC

## 2021-06-09 MED ORDER — LANTUS SOLOSTAR 100 UNIT/ML ~~LOC~~ SOPN: 40.0000 [IU] | PEN_INJECTOR | Freq: Every day | SUBCUTANEOUS | 1 refills | Status: DC

## 2021-06-09 NOTE — Patient Instructions (Signed)

## 2021-06-09 NOTE — Progress Notes (Signed)
06/09/2021, 5:03 PM    Endocrinology follow-up note    Subjective:    Patient ID: JOHNSON ARIZOLA, male    DOB: [birth year].  [name] is being seen in follow-up for the management of currently uncontrolled type 2 diabetes, hypothyroidism, hypertension, hyperlipidemia.   Patient was previously seen by [name] for endocrinology care.  Past Medical History:  Diagnosis Date   Arthritis    rheumatoid in his hands   Atrial fibrillation (HCC)    Bulging lumbar disc    Cancer ([place])    DDD (degenerative disc disease), cervical    Diabetes mellitus    Diabetic retinopathy ([place])    Double vision    Dysrhythmia    Atrial Fibrillation   History of prostate cancer    Hypertension    Low back pain    Nephrolithiasis    Sleep apnea    uses cpap    Past Surgical History:  Procedure Laterality Date   APPENDECTOMY     BACK SURGERY     Bilateral L4-5 lumbar decompression including bilateral  11/2008   brachytherapy     COLONOSCOPY     CYSTOSCOPY     Left L4-L5 lumbar laminotomy and microdiskectomy with  07/14/2008     LITHOTRIPSY     LUMBAR LAMINECTOMY/DECOMPRESSION MICRODISCECTOMY Left 08/18/2013   Procedure: Left Lumbar Five-Sacral One Microdiskectomy;  Surgeon: [name], MD;  Location: [facility];  Service: Neurosurgery;  Laterality: Left;  Left Lumbar Five-Sacral One Microdiskectomy   rotator cuff surgery     Scleral buckle, laser photocoagulation, and anterior chamber  01/04/2009    Social History   Socioeconomic History   Marital status: Married    Spouse name: [name]   Number of children: 2   Years of education: 12   Highest education level: Not on file  Occupational History   Not on file  Tobacco Use   Smoking status: Former    Types: Cigarettes    Quit date: 02/12/1973    Years since quitting: 48.3   Smokeless tobacco: Never   Tobacco comments:    starting at  age 74  Vaping Use   Vaping Use: Never used  Substance and Sexual Activity   Alcohol use: No   Drug use: No   Sexual activity: Yes  Other Topics Concern   Not on file  Social History Narrative   Lives at home with wife   Drinks 4 cups of coffee a day   Social Determinants of Health   Financial Resource Strain: Not on file  Food Insecurity: Not on file  Transportation Needs: Not on file  Physical Activity: Not on file  Stress: Not on file  Social Connections: Not on file    Family History  Problem Relation Age of Onset   Heart failure Father    Heart failure Mother    Heart failure Brother        Half brother.   Colon cancer Neg Hx  TABLET DAILY  ° B-D ULTRAFINE III SHORT PEN 31G X 8 MM MISC USE 1 PEN NEEDLE AS DIRECTED  ° Blood Glucose Monitoring Suppl (FREESTYLE FREEDOM LITE) w/Device KIT 1 each by Does not apply route 4 (four) times daily. USE BLOOD GLUCOSE MONITOR TO CHECK BLOOD SUGAR.  ° diltiazem (CARDIZEM CD) 240 MG 24 hr capsule TAKE 1 CAPSULE DAILY  ° ELIQUIS 5 MG TABS tablet TAKE 1 TABLET TWICE A DAY  ° ezetimibe (ZETIA) 10 MG tablet TAKE 1 TABLET EVERY EVENING AFTER DINNER  ° GLIPIZIDE XL 5 MG 24 hr tablet TAKE 1 TABLET DAILY WITH BREAKFAST  ° glucose blood (FREESTYLE LITE) test strip USE TO CHECK BLOOD SUGAR THREE TIMES A DAY AS INSTRUCTED  ° insulin glargine (LANTUS SOLOSTAR) 100 UNIT/ML Solostar Pen Inject 40 Units into the skin at bedtime.  ° Lancets (FREESTYLE) lancets Use as instructed to check blood sugar 4 times per day dx code 250.00  ° metoprolol succinate (TOPROL-XL) 25 MG 24 hr tablet TAKE 1 TABLET DAILY WITH OR IMMEDIATELY FOLLOWING A MEAL  ° SYNTHROID 50 MCG tablet TAKE 1 TABLET DAILY BEFORE BREAKFAST  ° [DISCONTINUED] cyclobenzaprine (FLEXERIL) 5 MG tablet 1 tablet as needed  ° [DISCONTINUED] FREESTYLE LITE test strip USE TO CHECK BLOOD SUGAR FOUR  TIMES A DAY AS INSTRUCTED (Patient taking differently: Pt is checking BG once a day)  ° [DISCONTINUED] LANTUS SOLOSTAR 100 UNIT/ML Solostar Pen INJECT 42 UNITS UNDER THE SKIN AT BEDTIME (Patient taking differently: 38 Units.)  ° °No facility-administered encounter medications on file as of 06/09/2021.  ° ° °ALLERGIES: °Allergies  °Allergen Reactions  ° Beta Adrenergic Blockers Other (See Comments)  °  Severe dizziness  ° Other Other (See Comments)  °  Beta or alpha blockers: severe dizziness  ° Tramadol Other (See Comments)  °  Dizziness   ° ° °VACCINATION STATUS: °Immunization History  °Administered Date(s) Administered  ° Influenza Split 02/12/2013  ° Influenza, High Dose Seasonal PF 03/26/2018  ° Influenza,inj,Quad PF,6+ Mos 02/26/2015  ° Influenza-Unspecified 02/17/2014  ° Pneumococcal Conjugate-13 11/26/2014  ° Tdap 07/21/2012  ° ° °Diabetes °He presents for his follow-up diabetic visit. He has type 2 diabetes mellitus. Onset time: He was diagnosed at approximate age of 60 years. His disease course has been fluctuating. There are no hypoglycemic associated symptoms. Pertinent negatives for hypoglycemia include no confusion, headaches, pallor or seizures. Pertinent negatives for diabetes include no chest pain, no fatigue, no polydipsia, no polyphagia, no polyuria and no weakness. There are no hypoglycemic complications. Symptoms are worsening. Diabetic complications include nephropathy and retinopathy. Risk factors for coronary artery disease include dyslipidemia, family history, male sex, sedentary lifestyle and tobacco exposure. Current diabetic treatment includes insulin injections and oral agent (monotherapy) (He is currently on Lantus 36 units nightly, glipizide 5 mg daily. ). He is compliant with treatment most of the time. His weight is fluctuating minimally. He is following a generally unhealthy diet. When asked about meal planning, he reported none. He has not had a previous visit with a dietitian. He  participates in exercise intermittently. His home blood glucose trend is increasing steadily. His breakfast blood glucose range is generally 180-200 mg/dl. His bedtime blood glucose range is generally >200 mg/dl. His overall blood glucose range is >200 mg/dl. (Patient is experiencing cognitive decline.  He is being cared for by his wife.  He is most recent A1c today is 8.6%, increasing from 8% during his last visit.  He did not have hypoglycemia.   ° °)   is 8.6%, increasing from 8% during his last visit.  He did not have hypoglycemia.    ) An ACE inhibitor/angiotensin II receptor blocker is being taken. Eye exam is current.  Hyperlipidemia This is a chronic problem. The current episode started more than 1 year ago. The problem is controlled. Exacerbating diseases include diabetes and obesity. Pertinent negatives include no chest pain, myalgias or shortness of breath. Current antihyperlipidemic treatment includes statins. Risk factors for coronary artery disease include diabetes mellitus, dyslipidemia, hypertension, male sex and a sedentary lifestyle.  Hypertension This is a chronic problem. The current episode started more than 1 year ago. Pertinent negatives include no chest pain, headaches, neck pain, palpitations or shortness of breath. Risk factors for coronary artery disease include dyslipidemia, diabetes mellitus, male gender, obesity, sedentary lifestyle and smoking/tobacco exposure. Past treatments include ACE inhibitors. Hypertensive end-organ damage includes retinopathy.    Review of systems  Constitutional: + Minimally fluctuating body weight,  current  Body mass index is 31.22 kg/m. , no fatigue, no subjective hyperthermia, no subjective hypothermia     Objective:    BP 128/74    Pulse 68    Ht 5' 10" (1.778 m)    Wt 217 lb 9.6 oz (98.7 kg)    BMI 31.22 kg/m   Wt Readings from Last 3 Encounters:  06/09/21 217 lb 9.6 oz (98.7 kg)  10/26/20 216 lb (98 kg)  10/08/20 215 lb (97.5 kg)    Physical Exam- Limited  Constitutional:  Body mass index is 31.22 kg/m. , not in  acute distress, normal state of mind    CMP     Component Value Date/Time   NA 144 06/02/2021 0942   K 4.5 06/02/2021 0942   CL 106 06/02/2021 0942   CO2 23 06/02/2021 0942   GLUCOSE 73 06/02/2021 0942   GLUCOSE 170 (H) 03/25/2020 0752   BUN 29 (H) 06/02/2021 0942   CREATININE 1.28 (H) 06/02/2021 0942   CREATININE 1.50 (H) 03/25/2020 0752   CALCIUM 9.4 06/02/2021 0942   PROT 7.1 06/02/2021 0942   ALBUMIN 4.1 06/02/2021 0942   AST 17 06/02/2021 0942   ALT 13 06/02/2021 0942   ALKPHOS 86 06/02/2021 0942   BILITOT 0.3 06/02/2021 0942   GFRNONAA 44 (L) 03/25/2020 0752   GFRAA 51 (L) 03/25/2020 0752     Diabetic Labs (most recent): Lab Results  Component Value Date   HGBA1C 8.6 (A) 06/09/2021   HGBA1C 8.0 (A) 10/08/2020   HGBA1C 8 10/08/2020     Lipid Panel ( most recent) Lipid Panel     Component Value Date/Time   CHOL 144 06/02/2021 0942   TRIG 60 06/02/2021 0942   HDL 56 06/02/2021 0942   CHOLHDL 2.6 06/02/2021 0942   CHOLHDL 2.8 02/11/2020 1000   VLDL 12 02/11/2020 1000   LDLCALC 76 06/02/2021 0942   LDLCALC 130 (H) 09/16/2019 0809      Lab Results  Component Value Date   TSH 3.870 06/02/2021   TSH 5.410 (H) 10/01/2020   TSH 5.54 (H) 03/25/2020   TSH 3.70 09/16/2019   TSH 3.05 01/14/2019   TSH 4.80 (H) 09/30/2018   TSH 3.27 12/21/2017   TSH 2.60 06/11/2017   TSH 2.36 05/22/2016   TSH 2.18 06/16/2015   FREET4 1.24 06/02/2021   FREET4 1.15 10/01/2020   FREET4 1.0 03/25/2020   FREET4 1.0 09/16/2019   FREET4 1.2 01/14/2019   FREET4 0.9 09/30/2018   FREET4 0.92 06/11/2017   FREET4 0.95 05/22/2016  mellitus with hyperglycemia (HCC) ° °- Yahye E Corpus has currently uncontrolled symptomatic type 2 DM since  81 years of age. ° °Patient is experiencing cognitive decline.  He is being cared for by his wife.  He is most recent A1c today is 8.6%, increasing from 8% during  his last visit.  He did not have hypoglycemia.   ° °-his diabetes is complicated by retinopathy and he remains at a high risk for more acute and chronic complications which include CAD, CVA, CKD, retinopathy, and neuropathy. These are all discussed in detail with him. ° °- I have counseled him on diet management and weight loss, by adopting a carbohydrate restricted/protein rich diet. ° °- he acknowledges that there is a room for improvement in his food and drink choices. °- Suggestion is made for him to avoid simple carbohydrates  from his diet including Cakes, Sweet Desserts, Ice Cream, Soda (diet and regular), Sweet Tea, Candies, Chips, Cookies, Store Bought Juices, Alcohol , Artificial Sweeteners,  Coffee Creamer, and "Sugar-free" Products, Lemonade. This will help patient to have more stable blood glucose profile and potentially avoid unintended weight gain. ° °The following Lifestyle Medicine recommendations according to American College of Lifestyle Medicine  ( ACLM) were discussed and and offered to patient and he  agrees to start the journey:  °A. Whole Foods, Plant-Based Nutrition comprising of fruits and vegetables, plant-based proteins, whole-grain carbohydrates was discussed in detail with the patient.   A list for source of those nutrients were also provided to the patient.  Patient will use only water or unsweetened tea for hydration. °B.  The need to stay away from risky substances including alcohol, smoking; obtaining 7 to 9 hours of restorative sleep, at least 150 minutes of moderate intensity exercise weekly, the importance of healthy social connections,  and stress management techniques were discussed. ° °- he is advised to stick to a routine mealtimes to eat 3 meals  a day and avoid unnecessary snacks ( to snack only to correct hypoglycemia).  ° °- I have approached him with the following individualized plan to manage diabetes and patient agrees:  ° °-Given  his presentation with above target  glycemic profile.  He will not need prandial insulin.  This program will be to complicated for the couple.  °-He has benefited and will continue to benefit from simplified treatment with optimal dose of basal insulin.  He is advised to increase insulin Lantus to 40 units nightly, care assisted by his wife.   ° °-I requested for the couple to monitor blood glucose at least twice a day-daily before breakfast and at bedtime. °-He has benefited from low-dose glipizide.  He is advised to continue  glipizide 5 mg XL p.o. daily at breakfast.   ° °-He has worsening renal function, advised to stay off of Metformin.  he will be considered for incretin therapy as appropriate next visit. °He will be considered for premixed insulin twice a day if his response is not satisfactory during next visit. ° ° ° °- Patient specific target  A1c;  LDL, HDL, Triglycerides,  were discussed in detail. ° °2) Blood Pressure /Hypertension: °-His blood pressure is controlled to target. ° he is advised to continue his current medications including benazepril 40 mg p.o. daily with breakfast. ° °3) Lipids/Hyperlipidemia:   Review of his recent lipid panel showed increased LDL at 79 improving from 130.  He is asked to  continue simvastatin 40 mg p.o. daily at bedtime.     Also   on Zetia 10 mg p.o. nightly.    ° Side effects and precautions discussed with him. ° °4)  Weight/Diet: His BMI is 31.2.       He is a candidate for modest weight loss.  I discussed with him the fact that loss of 5 - 10% of his  current body weight will have the most impact on his diabetes management.  CDE Consult will be initiated . Exercise, and detailed carbohydrates information provided  -  detailed on discharge instructions. ° ° °5) Hypothyroidism:  ° °-He is now taking levothyroxine.  His previsit thyroid function tests are consistent with appropriate replacement.  He is advised to continue levothyroxine 50 mcg p.o. daily before breakfast.   ° - We discussed about the  correct intake of his thyroid hormone, on empty stomach at fasting, with water, separated by at least 30 minutes from breakfast and other medications,  and separated by more than 4 hours from calcium, iron, multivitamins, acid reflux medications (PPIs). °-Patient is made aware of the fact that thyroid hormone replacement is needed for life, dose to be adjusted by periodic monitoring of thyroid function tests. ° ° °6) Chronic Care/Health Maintenance: ° °-he  is on ACEI/ARB and Statin medications and  is encouraged to initiate and continue to follow up with Ophthalmology, Dentist,  Podiatrist at least yearly or according to recommendations, and advised to   stay away from smoking. I have recommended yearly flu vaccine and pneumonia vaccine at least every 5 years; moderate intensity exercise for up to 150 minutes weekly; and  sleep for at least 7 hours a day. °  °- he is  advised to maintain close follow up with his PMD  for primary care needs, as well as his other providers for optimal and coordinated care. ° ° °I spent 42 minutes in the care of the patient today including review of labs from CMP, Lipids, Thyroid Function, Hematology (current and previous including abstractions from other facilities); face-to-face time discussing  his blood glucose readings/logs, discussing hypoglycemia and hyperglycemia episodes and symptoms, medications doses, his options of short and long term treatment based on the latest standards of care / guidelines;  discussion about incorporating lifestyle medicine;  and documenting the encounter. ° °  Please refer to Patient Instructions for Blood Glucose Monitoring and Insulin/Medications Dosing Guide"  in media tab for additional information. Please  also refer to " Patient Self Inventory" in the Media  tab for reviewed elements of pertinent patient history. ° °Lauri E Kitson participated in the discussions, expressed understanding, and voiced agreement with the above plans.  All questions  were answered to his satisfaction. he is encouraged to contact clinic should he have any questions or concerns prior to his return visit. ° ° °Follow up plan: °- Return in about 3 months (around 09/07/2021) for Bring Meter and Logs- A1c in Office. ° ° °Gebre , MD °[facility] °[facility] °[address] °[place],  [place] [address] °Phone: [phone]  Fax: [phone]   ° °06/09/2021, 5:03 PM ° °This note was partially dictated with voice recognition software. Similar sounding words can be transcribed inadequately or may not  be corrected upon review. ° °

## 2021-06-10 ENCOUNTER — Telehealth: Payer: Self-pay | Admitting: Cardiology

## 2021-06-10 NOTE — Telephone Encounter (Signed)
Please inform the best person to do this is PCP. I do not have the capacity to do mental status exam

## 2021-06-13 ENCOUNTER — Other Ambulatory Visit: Payer: Self-pay

## 2021-06-13 MED ORDER — FREESTYLE LITE TEST VI STRP: ORAL_STRIP | 1 refills | Status: DC

## 2021-06-21 ENCOUNTER — Other Ambulatory Visit: Payer: Self-pay | Admitting: "Endocrinology

## 2021-06-29 ENCOUNTER — Telehealth: Payer: Self-pay | Admitting: Cardiology

## 2021-06-29 ENCOUNTER — Other Ambulatory Visit: Payer: Self-pay

## 2021-06-29 ENCOUNTER — Ambulatory Visit: Payer: Medicare Other | Admitting: Cardiology

## 2021-06-29 ENCOUNTER — Telehealth: Payer: Self-pay | Admitting: Diagnostic Neuroimaging

## 2021-06-29 ENCOUNTER — Encounter: Payer: Self-pay | Admitting: Cardiology

## 2021-06-29 VITALS — BP 133/63 | HR 70 | Temp 98.0°F | Resp 16 | Ht 70.0 in | Wt 218.6 lb

## 2021-06-29 DIAGNOSIS — I4819 Other persistent atrial fibrillation: Secondary | ICD-10-CM

## 2021-06-29 DIAGNOSIS — I10 Essential (primary) hypertension: Secondary | ICD-10-CM

## 2021-06-29 DIAGNOSIS — I428 Other cardiomyopathies: Secondary | ICD-10-CM

## 2021-06-29 NOTE — Telephone Encounter (Signed)
Patient's wife states that he has been having some expressive aphasia issues and wanted to be evaluated for advancing dementia.  She was also wondering how she could get power of attorney for health so that she could talk to concern people if need arises.  She is also wondering if Mr. Horton can see you for evaluation as it has been a while.   Adrian Prows, MD, [facility] 06/29/2021, 1:40 PM Office: [phone] Fax: [phone] Pager: [phone]

## 2021-06-29 NOTE — Telephone Encounter (Signed)
Pt's wife, Jacque Garrels (on [place]) cognitive impairment worsening; he cannot vocalize a response to some questions. Have to help him with a response.   Schedule appt with Dr. Leta Baptist on 08/30/21 at 3:30 pm.

## 2021-06-29 NOTE — Telephone Encounter (Signed)
LVM for wife requesting call back.

## 2021-06-29 NOTE — Telephone Encounter (Signed)
See other phone note today re: memory.

## 2021-06-29 NOTE — Progress Notes (Signed)
° °Primary Physician:  Patient, No Pcp Per (Inactive) ° ° °Patient ID: Jonathan Hensley, male    DOB: 08/15/1940, 80 y.o.   MRN: [id number] ° °Subjective:  ° ° °Chief Complaint  °Patient presents with  ° Atrial Fibrillation  ° Cardiomyopathy  ° Hypertension  ° Hyperlipidemia  ° Follow-up  °  6 months  ° ° °HPI: Jonathan Hensley  is a 80 y.o. male  with history of diabetes mellitus uncontrolled, hypertension and hyperlipidemia, nonischemic cardiomyopathy with mildly reduced LVEF however EF is normalized by medical therapy as of echocardiogram in 2022, paroxysmal atrial fibrillation. He has double vision and prism colors due to h/o retinal detachment (chronic) but has also developed diabetic retinopathy left eye, chronic vertigo. He has sleep apnea and uses CPAP on a regular basis.  He has developed expressive aphasia and his wife is concerned about this. ° °Patient reports he is doing relatively well overall without specific complaints today.  Denies chest pain, palpitations, dyspnea, dizziness, syncope, near syncope.  Denies fatigue, orthopnea, leg swelling. ° °Past Medical History:  °Diagnosis Date  ° Arthritis   ° rheumatoid in his hands  ° Atrial fibrillation (HCC)   ° Bulging lumbar disc   ° Cancer (HCC)   ° DDD (degenerative disc disease), cervical   ° Diabetes mellitus   ° Diabetic retinopathy (HCC)   ° Double vision   ° Dysrhythmia   ° Atrial Fibrillation  ° History of prostate cancer   ° Hypertension   ° Low back pain   ° Nephrolithiasis   ° Sleep apnea   ° uses cpap  ° ° °Past Surgical History:  °Procedure Laterality Date  ° APPENDECTOMY    ° BACK SURGERY    ° Bilateral L4-5 lumbar decompression including bilateral  11/2008  ° brachytherapy    ° COLONOSCOPY    ° CYSTOSCOPY    ° Left L4-L5 lumbar laminotomy and microdiskectomy with  07/14/2008    ° LITHOTRIPSY    ° LUMBAR LAMINECTOMY/DECOMPRESSION MICRODISCECTOMY Left 08/18/2013  ° Procedure: Left Lumbar Five-Sacral One Microdiskectomy;  Surgeon: Robert W Nudelman,  MD;  Location: [facility];  Service: Neurosurgery;  Laterality: Left;  Left Lumbar Five-Sacral One Microdiskectomy  ° rotator cuff surgery    ° Scleral buckle, laser photocoagulation, and anterior chamber  01/04/2009  ° °Family History  °Problem Relation Age of Onset  ° Heart failure Father   ° Heart failure Mother   ° Heart failure Brother   °     Half brother.  ° Colon cancer Neg Hx   ° ° °Social History  ° °Tobacco Use  ° Smoking status: Former  °  Types: Cigarettes  °  Quit date: 02/12/1973  °  Years since quitting: 48.4  ° Smokeless tobacco: Never  ° Tobacco comments:  °  starting at age 16  °Substance Use Topics  ° Alcohol use: No  ° °Marital Status: Married  ° °ROS  ° °Review of Systems  °Cardiovascular:  Negative for chest pain, dyspnea on exertion and leg swelling.  ° °Objective:  °Blood pressure 133/63, pulse 70, temperature 98 °F (36.7 °C), temperature source Temporal, resp. rate 16, height 5' 10" (1.778 m), weight 218 lb 9.6 oz (99.2 kg), SpO2 99 %. Body mass index is 31.37 kg/m².  °Vitals with BMI 06/29/2021 06/09/2021 10/26/2020  °Height 5' 10" 5' 10" 5' 10"  °Weight 218 lbs 10 oz 217 lbs 10 oz 216 lbs  °BMI 31.37 31.22 30.99  °Systolic 133 128   993 716  Diastolic 63 74 76  Pulse 70 68 65    Physical Exam Neck:     Vascular: No carotid bruit or JVD.  Cardiovascular:     Rate and Rhythm: Normal rate. Rhythm irregularly irregular.     Pulses: Intact distal pulses.     Heart sounds: Normal heart sounds. No murmur heard.   No gallop.  Pulmonary:     Effort: Pulmonary effort is normal.     Breath sounds: Normal breath sounds.  Abdominal:     General: Bowel sounds are normal.     Palpations: Abdomen is soft.  Musculoskeletal:     Right lower leg: No edema.     Left lower leg: No edema.    CMP Latest Ref Rng & Units 06/02/2021 10/01/2020 03/25/2020  Glucose 70 - 99 mg/dL 73 176(H) 170(H)  BUN 8 - 27 mg/dL 29(H) 26 41(H)  Creatinine 0.76 - 1.27 mg/dL 1.28(H) 1.20 1.50(H)  Sodium 134 - 144  mmol/L 144 143 141  Potassium 3.5 - 5.2 mmol/L 4.5 4.5 4.5  Chloride 96 - 106 mmol/L 106 106 105  CO2 20 - 29 mmol/L _0 Calcium 8.6 - 10.2 mg/dL 9.4 9.5 9.4  Total Protein 6.0 - 8.5 g/dL 7.1 6.9 6.9  Total Bilirubin 0.0 - 1.2 mg/dL 0.3 0.2 0.3  Alkaline Phos 44 - 121 IU/L 86 92 -  AST 0 - 40 IU/L _1 ALT 0 - 44 IU/L _2 CBC Latest Ref Rng & Units 05/16/2019 06/18/2018 03/26/2018  WBC 4.0 - 10.5 K/uL 3.9(L) 6.4 7.5  Hemoglobin 13.0 - 17.0 g/dL 12.9(L) 12.0(L) 12.2(L)  Hematocrit 39.0 - 52.0 % 38.9(L) 36.2(L) 36.9(L)  Platelets 150 - 400 K/uL 208 219.0 249.0   Lipid Panel Recent Labs    06/02/21 0942  CHOL 144  TRIG 60  LDLCALC 76  HDL 56  CHOLHDL 2.6    HEMOGLOBIN A1C Lab Results  Component Value Date   HGBA1C 8.6 (A) 06/09/2021   MPG 206 01/14/2019   TSH Recent Labs    10/01/20 0804 06/02/21 0942  TSH 5.410* 3.870   Allergies   Allergies  Allergen Reactions   Beta Adrenergic Blockers Other (See Comments)    Severe dizziness   Other Other (See Comments)    Beta or alpha blockers: severe dizziness   Tramadol Other (See Comments)    Dizziness     Medications Prior to Visit:   Outpatient Medications Prior to Visit  Medication Sig Dispense Refill   atorvastatin (LIPITOR) 10 MG tablet TAKE 1 TABLET DAILY 90 tablet 3   B-D ULTRAFINE III SHORT PEN 31G X 8 MM MISC USE 1 PEN NEEDLE AS DIRECTED 100 each 3   Blood Glucose Monitoring Suppl (FREESTYLE FREEDOM LITE) w/Device KIT 1 each by Does not apply route 4 (four) times daily. USE BLOOD GLUCOSE MONITOR TO CHECK BLOOD SUGAR. 1 each 0   diltiazem (CARDIZEM CD) 240 MG 24 hr capsule TAKE 1 CAPSULE DAILY 90 capsule 3   ELIQUIS 5 MG TABS tablet TAKE 1 TABLET TWICE A DAY 180 tablet 3   ezetimibe (ZETIA) 10 MG tablet TAKE 1 TABLET EVERY EVENING AFTER DINNER 90 tablet 3   GLIPIZIDE XL 5 MG 24 hr tablet TAKE 1 TABLET DAILY WITH BREAKFAST 90 tablet 0   glucose blood (FREESTYLE LITE) test strip USE TO CHECK  BLOOD SUGAR THREE TIMES A DAY AS INSTRUCTED 300 each 1   insulin glargine (LANTUS  SOLOSTAR) 100 UNIT/ML Solostar Pen Inject 40 Units into the skin at bedtime. 30 mL 1   Lancets (FREESTYLE) lancets Use as instructed to check blood sugar 4 times per day dx code 250.00 400 each 3   metoprolol succinate (TOPROL-XL) 25 MG 24 hr tablet TAKE 1 TABLET DAILY WITH OR IMMEDIATELY FOLLOWING A MEAL 90 tablet 3   SYNTHROID 50 MCG tablet TAKE 1 TABLET DAILY BEFORE BREAKFAST 90 tablet 1   No facility-administered medications prior to visit.   Final Medications at End of Visit    Current Meds  Medication Sig   atorvastatin (LIPITOR) 10 MG tablet TAKE 1 TABLET DAILY   B-D ULTRAFINE III SHORT PEN 31G X 8 MM MISC USE 1 PEN NEEDLE AS DIRECTED   Blood Glucose Monitoring Suppl (FREESTYLE FREEDOM LITE) w/Device KIT 1 each by Does not apply route 4 (four) times daily. USE BLOOD GLUCOSE MONITOR TO CHECK BLOOD SUGAR.   diltiazem (CARDIZEM CD) 240 MG 24 hr capsule TAKE 1 CAPSULE DAILY   ELIQUIS 5 MG TABS tablet TAKE 1 TABLET TWICE A DAY   ezetimibe (ZETIA) 10 MG tablet TAKE 1 TABLET EVERY EVENING AFTER DINNER   GLIPIZIDE XL 5 MG 24 hr tablet TAKE 1 TABLET DAILY WITH BREAKFAST   glucose blood (FREESTYLE LITE) test strip USE TO CHECK BLOOD SUGAR THREE TIMES A DAY AS INSTRUCTED   insulin glargine (LANTUS SOLOSTAR) 100 UNIT/ML Solostar Pen Inject 40 Units into the skin at bedtime.   Lancets (FREESTYLE) lancets Use as instructed to check blood sugar 4 times per day dx code 250.00   metoprolol succinate (TOPROL-XL) 25 MG 24 hr tablet TAKE 1 TABLET DAILY WITH OR IMMEDIATELY FOLLOWING A MEAL   SYNTHROID 50 MCG tablet TAKE 1 TABLET DAILY BEFORE BREAKFAST      Radiology   No results found.  Cardiac Studies:   Holter Monitor 12/26/11: Brief A. fibrillation with RVR . NO symptoms reported. Paroxysmal epidoses. ED visit 06/14/2016: A. Fib with RVR.  Lexiscan Tetrofosmin Stress Test  02/09/2020: Nondiagnostic ECG stress.  Resting EKG demonstrated normal sinus rhythm, occasional premature atrial contractions. Peak EKG, no ST-T wave abnormalities. During infusion the peak ECG revealed brief atrial tachycardia and premature atrial contractions. There is a fixed mild defect in inferior region consistent with diaphragmatic attenuation. Superimposed on this, there is a partially reversible small sized mild defect in the inferior region.  Overall LV systolic function is normal without regional wall motion abnormalities. Stress LV EF: 58%.  Compared to 02/23/12, inferior defect is new. Low risk  Echocardiogram 10/18/2020:  Normal LV systolic function with visual EF 55-60%. Left ventricle cavity  is normal in size. Normal global wall motion. Normal diastolic filling  pattern, normal LAP.  Left atrial cavity is mildly dilated.  Mild (Grade I) mitral regurgitation.  Mild tricuspid regurgitation. No evidence of pulmonary hypertension. RVSP  measures 34 mmHg.  Compared to study dated 01/26/2020: LVEF improved from 45-50% to 55-60%,  LAE was severely dilated and now mild LAE.    EKG:   EKG 06/29/2021: Atrial fibrillation with controlled ventricular sponsor rate of 78 bpm, nonspecific T abnormality.  Compared to 10/26/2020, A-fib has replaced sinus rhythm.  Previously patient was in atrial fibrillation persistently on 02/16/2020 and 01/09/2020..  Assessment:     ICD-10-CM   1. Non-ischemic cardiomyopathy ([place])  I42.8 EKG 12-Lead    2. Persistent atrial fibrillation (HCC)  I48.19     3. Essential hypertension  I10      There are no  discontinued medications.   No orders of the defined types were placed in this encounter.    CHA2DS2-VASc Score is 4.  Yearly risk of stroke: 4.8% (A, HTN, DM).  Score of 1=0.6; 2=2.2; 3=3.2; 4=4.8; 5=7.2; 6=9.8; 7=>9.8) -(CHF; HTN; vasc disease DM,  Male = 1; Age <65 =0; 65-74 = 1,  >75 =2; stroke/embolism= 2).    Recommendations:   Jonathan Hensley  is a 81 y.o. with history of  diabetes mellitus uncontrolled, hypertension and hyperlipidemia, nonischemic cardiomyopathy with mildly reduced LVEF however EF is normalized by medical therapy as of echocardiogram in 2022, paroxysmal atrial fibrillation. He has double vision and prism colors due to h/o retinal detachment (chronic) but has also developed diabetic retinopathy left eye, chronic vertigo. He has sleep apnea and uses CPAP on a regular basis.  He has developed expressive aphasia and his wife is concerned about this.  Physical examination reveals that he is in persistent atrial fibrillation, 6 months ago he was transiently in sinus rhythm.  He is presently on anticoagulation, external labs reviewed, lipids abnormal, CBC and BMP stable.  Hence continue anticoagulation in view of high chads vascular score.  Also since adding metoprolol his heart rate is also well controlled.  I sent a telephone encounter visit to Dr. Leta Baptist who had seen him in the past regarding evaluation from neurologic standpoint again.  I will see him back on an annual basis.    Adrian Prows, PA-C 06/29/2021, 1:50 PM Office: [phone]

## 2021-06-29 NOTE — Telephone Encounter (Signed)
Other phone note started: Pt's wife, Tighe Gitto (on [place]) cognitive impairment worsening; he cannot vocalize a response to some questions. Have to help him with a response.   Schedule appt with Dr. Leta Baptist on 08/30/21 at 3:30 pm. I called wife and moved appointment sooner, she wants diagnosis  of memory updated so she can pursue POA. We rescheduled, she understands to arrive 30 minutes early, bring new insurance cards, verbalized understanding, appreciation.

## 2021-06-29 NOTE — Telephone Encounter (Signed)
Dr. Einar Gip thank you for the update.  We can reach out to patient and wife. He has primary progressive aphasia diagnosis, and his symptoms are expected for the condition. I will have my nurses reach out to provide information on community resources and elder care attorneys to setup power of attorney etc.   Penni Bombard, MD 1/91/4782, 9:56 PM Certified in Neurology, Neurophysiology and Neuroimaging  [facility] [address] [place], [place] [address] [phone]

## 2021-06-30 NOTE — Telephone Encounter (Signed)
Follow up was canceled, letter written and signed by Dr Leta Baptist and mailed to wife. [place] dementia resource packet and list of 4 elder care attorneys included in mailing.

## 2021-06-30 NOTE — Telephone Encounter (Signed)
Called wife to discuss further what she is needing, if it is POA.  Advised she can speak with elder care attorney. If he is not able to agree, then Dr Leta Baptist can write letter stating his diagnosis (primary progressive aphasia) and that he is not capable of making decisions or expressing himself. She stated that she would like a letter. I advised I'll include caregiver packet and list of elder care attorneys. She stated appointment can be cancelled,  verbalized understanding, appreciation.

## 2021-07-20 ENCOUNTER — Ambulatory Visit: Payer: Medicare Other | Admitting: Diagnostic Neuroimaging

## 2021-07-29 ENCOUNTER — Other Ambulatory Visit: Payer: Self-pay | Admitting: Cardiology

## 2021-07-29 DIAGNOSIS — I4819 Other persistent atrial fibrillation: Secondary | ICD-10-CM

## 2021-07-30 ENCOUNTER — Other Ambulatory Visit: Payer: Self-pay | Admitting: "Endocrinology

## 2021-08-30 ENCOUNTER — Ambulatory Visit: Payer: Medicare Other | Admitting: Diagnostic Neuroimaging

## 2021-09-08 ENCOUNTER — Encounter: Payer: Self-pay | Admitting: "Endocrinology

## 2021-09-08 ENCOUNTER — Ambulatory Visit (INDEPENDENT_AMBULATORY_CARE_PROVIDER_SITE_OTHER): Payer: Medicare Other | Admitting: "Endocrinology

## 2021-09-08 VITALS — BP 124/78 | HR 60 | Ht 70.0 in | Wt 221.0 lb

## 2021-09-08 DIAGNOSIS — E039 Hypothyroidism, unspecified: Secondary | ICD-10-CM | POA: Diagnosis not present

## 2021-09-08 DIAGNOSIS — E1165 Type 2 diabetes mellitus with hyperglycemia: Secondary | ICD-10-CM

## 2021-09-08 DIAGNOSIS — I10 Essential (primary) hypertension: Secondary | ICD-10-CM | POA: Diagnosis not present

## 2021-09-08 DIAGNOSIS — E782 Mixed hyperlipidemia: Secondary | ICD-10-CM

## 2021-09-08 LAB — POCT GLYCOSYLATED HEMOGLOBIN (HGB A1C): HbA1c, POC (controlled diabetic range): 8.7 % — AB (ref 0.0–7.0)

## 2021-09-08 MED ORDER — SYNTHROID 25 MCG PO TABS: 25.0000 ug | ORAL_TABLET | Freq: Every day | ORAL | 1 refills | Status: DC

## 2021-09-08 NOTE — Patient Instructions (Signed)

## 2021-09-08 NOTE — Progress Notes (Signed)
? ?                                                             ?     09/08/2021, 12:50 PM ? ? ? ?Endocrinology follow-up note ? ? ? ?Subjective:  ? ? Patient ID: Jonathan Hensley, male    DOB: December 20, 1940.  ?Jonathan Hensley is being seen in follow-up for the management of currently uncontrolled type 2 diabetes, hypothyroidism, hypertension, hyperlipidemia.   ?Patient was previously seen by [name] for endocrinology care. ? ?Past Medical History:  ?Diagnosis Date  ? Arthritis   ? rheumatoid in his hands  ? Atrial fibrillation ([place])   ? Bulging lumbar disc   ? Cancer [facility])   ? DDD (degenerative disc disease), cervical   ? Diabetes mellitus   ? Diabetic retinopathy ([place])   ? Double vision   ? Dysrhythmia   ? Atrial Fibrillation  ? History of prostate cancer   ? Hypertension   ? Low back pain   ? Nephrolithiasis   ? Sleep apnea   ? uses cpap  ? ? ?Past Surgical History:  ?Procedure Laterality Date  ? APPENDECTOMY    ? BACK SURGERY    ? Bilateral L4-5 lumbar decompression including bilateral  11/2008  ? brachytherapy    ? COLONOSCOPY    ? CYSTOSCOPY    ? Left L4-L5 lumbar laminotomy and microdiskectomy with  07/14/2008    ? LITHOTRIPSY    ? LUMBAR LAMINECTOMY/DECOMPRESSION MICRODISCECTOMY Left 08/18/2013  ? Procedure: Left Lumbar Five-Sacral One Microdiskectomy;  Surgeon: [name], MD;  Location: [place] [facility];  Service: Neurosurgery;  Laterality: Left;  Left Lumbar Five-Sacral One Microdiskectomy  ? rotator cuff surgery    ? Scleral buckle, laser photocoagulation, and anterior chamber  01/04/2009  ? ? ?Social History  ? ?Socioeconomic History  ? Marital status: Married  ?  Spouse name: [name]  ? Number of children: 2  ? Years of education: 77  ? Highest education level: Not on file  ?Occupational History  ? Not on file  ?Tobacco Use  ? Smoking status: Former  ?  Types: Cigarettes  ?  Quit date: 02/12/1973  ?  Years since quitting: 48.6  ? Smokeless tobacco: Never  ? Tobacco comments:  ?  starting at  age 22  ?Vaping Use  ? Vaping Use: Never used  ?Substance and Sexual Activity  ? Alcohol use: No  ? Drug use: No  ? Sexual activity: Yes  ?Other Topics Concern  ? Not on file  ?Social History Narrative  ? Lives at home with wife  ? Drinks 4 cups of coffee a day  ? ?Social Determinants of Health  ? ?Financial Resource Strain: Not on file  ?Food Insecurity: Not on file  ?Transportation Needs: Not on file  ?Physical Activity: Not on file  ?Stress: Not on file  ?Social Connections: Not on file  ? ? ?Family History  ?Problem Relation Age of Onset  ? Heart failure Father   ? Heart failure Mother   ? Heart failure Brother   ?     Half brother.  ? Colon cancer Neg Hx   ? ? ?Outpatient Encounter Medications as of 09/08/2021  ?Medication Sig  ? SYNTHROID 25 MCG tablet Take 1 tablet (  25 mcg total) by mouth daily before breakfast.  ? atorvastatin (LIPITOR) 10 MG tablet TAKE 1 TABLET DAILY  ? B-D ULTRAFINE III SHORT PEN 31G X 8 MM MISC USE 1 PEN NEEDLE AS DIRECTED  ? Blood Glucose Monitoring Suppl (FREESTYLE FREEDOM LITE) w/Device KIT 1 each by Does not apply route 4 (four) times daily. USE BLOOD GLUCOSE MONITOR TO CHECK BLOOD SUGAR.  ? diltiazem (CARDIZEM CD) 240 MG 24 hr capsule TAKE 1 CAPSULE DAILY  ? ELIQUIS 5 MG TABS tablet TAKE 1 TABLET TWICE A DAY  ? ezetimibe (ZETIA) 10 MG tablet TAKE 1 TABLET EVERY EVENING AFTER DINNER  ? GLIPIZIDE XL 5 MG 24 hr tablet TAKE 1 TABLET DAILY WITH BREAKFAST  ? glucose blood (FREESTYLE LITE) test strip USE TO CHECK BLOOD SUGAR THREE TIMES A DAY AS INSTRUCTED  ? insulin glargine (LANTUS SOLOSTAR) 100 UNIT/ML Solostar Pen Inject 40 Units into the skin at bedtime.  ? Lancets (FREESTYLE) lancets Use as instructed to check blood sugar 4 times per day dx code 250.00  ? metoprolol succinate (TOPROL-XL) 25 MG 24 hr tablet TAKE 1 TABLET DAILY WITH OR IMMEDIATELY FOLLOWING A MEAL  ? [DISCONTINUED] SYNTHROID 50 MCG tablet TAKE 1 TABLET DAILY BEFORE BREAKFAST (Patient not taking: Reported on 09/08/2021)   ? ?No facility-administered encounter medications on file as of 09/08/2021.  ? ? ?ALLERGIES: ?Allergies  ?Allergen Reactions  ? Beta Adrenergic Blockers Other (See Comments)  ?  Severe dizziness  ? Other Other (See Comments)  ?  Beta or alpha blockers: severe dizziness  ? Tramadol Other (See Comments)  ?  Dizziness   ? ? ?VACCINATION STATUS: ?Immunization History  ?Administered Date(s) Administered  ? Influenza Split 02/12/2013  ? Influenza, High Dose Seasonal PF 03/26/2018  ? Influenza,inj,Quad PF,6+ Mos 02/26/2015  ? Influenza-Unspecified 02/17/2014  ? Pneumococcal Conjugate-13 11/26/2014  ? Tdap 07/21/2012  ? ? ?Diabetes ?He presents for his follow-up diabetic visit. He has type 2 diabetes mellitus. Onset time: He was diagnosed at approximate age of 81 years. His disease course has been worsening. There are no hypoglycemic associated symptoms. Pertinent negatives for hypoglycemia include no confusion, headaches, pallor or seizures. Pertinent negatives for diabetes include no chest pain, no fatigue, no polydipsia, no polyphagia, no polyuria and no weakness. There are no hypoglycemic complications. Symptoms are worsening. Diabetic complications include nephropathy and retinopathy. Risk factors for coronary artery disease include dyslipidemia, family history, male sex, sedentary lifestyle and tobacco exposure. Current diabetic treatment includes insulin injections and oral agent (monotherapy) (He is currently on Lantus 36 units nightly, glipizide 5 mg daily. ). He is compliant with treatment most of the time. His weight is increasing steadily. He is following a generally unhealthy diet. When asked about meal planning, he reported none. He has not had a previous visit with a dietitian. He participates in exercise intermittently. His home blood glucose trend is increasing steadily. His bedtime blood glucose range is generally >200 mg/dl. His overall blood glucose range is >200 mg/dl. (Patient is experiencing cognitive  decline.  He is being cared for by his wife.  His point-of-care A1c today is 8.7%, unchanged from his last visit.  His A1c is progressively increasing from 8%.  He did not document hypoglycemia, however he does not monitor blood glucose in the morning since he starts eating at 4 AM.   ? ? ?) An ACE inhibitor/angiotensin II receptor blocker is being taken. Eye exam is current.  ?Hyperlipidemia ?This is a chronic problem. The current episode started  more than 1 year ago. The problem is controlled. Exacerbating diseases include diabetes and obesity. Pertinent negatives include no chest pain, myalgias or shortness of breath. Current antihyperlipidemic treatment includes statins. Risk factors for coronary artery disease include diabetes mellitus, dyslipidemia, hypertension, male sex and a sedentary lifestyle.  ?Hypertension ?This is a chronic problem. The current episode started more than 1 year ago. Pertinent negatives include no chest pain, headaches, neck pain, palpitations or shortness of breath. Risk factors for coronary artery disease include dyslipidemia, diabetes mellitus, male gender, obesity, sedentary lifestyle and smoking/tobacco exposure. Past treatments include ACE inhibitors. Hypertensive end-organ damage includes retinopathy.  ? ? ?Review of systems ? ?Constitutional: + Minimally fluctuating body weight,  current  Body mass index is 31.71 kg/m?. , no fatigue, no subjective hyperthermia, no subjective hypothermia ? ? ? ? ?Objective:  ?  ?BP 124/78   Pulse 60   Ht _0  (1.778 m)   Wt 221 lb (100.2 kg)   BMI 31.71 kg/m?   ?Wt Readings from Last 3 Encounters:  ?09/08/21 221 lb (100.2 kg)  ?06/29/21 218 lb 9.6 oz (99.2 kg)  ?06/09/21 217 lb 9.6 oz (98.7 kg)  ? ? ?Physical Exam- Limited ? ?Constitutional:  Body mass index is 31.71 kg/m?. , not in acute distress, normal state of mind ? ? ? ?CMP  ?   ?Component Value Date/Time  ? NA 144 06/02/2021 0942  ? K 4.5 06/02/2021 0942  ? CL 106 06/02/2021 0942  ?  CO2 23 06/02/2021 0942  ? GLUCOSE 73 06/02/2021 0942  ? GLUCOSE 170 (H) 03/25/2020 0752  ? BUN 29 (H) 06/02/2021 0942  ? CREATININE 1.28 (H) 06/02/2021 8184  ? CREATININE 1.50 (H) 03/25/2020 0752  ? CALCI

## 2021-10-17 ENCOUNTER — Other Ambulatory Visit: Payer: Self-pay | Admitting: "Endocrinology

## 2021-10-17 DIAGNOSIS — E1165 Type 2 diabetes mellitus with hyperglycemia: Secondary | ICD-10-CM

## 2021-11-10 ENCOUNTER — Other Ambulatory Visit: Payer: Self-pay | Admitting: "Endocrinology

## 2021-11-10 DIAGNOSIS — E1165 Type 2 diabetes mellitus with hyperglycemia: Secondary | ICD-10-CM

## 2021-12-15 ENCOUNTER — Other Ambulatory Visit: Payer: Self-pay | Admitting: "Endocrinology

## 2022-01-12 ENCOUNTER — Ambulatory Visit: Payer: Medicare Other | Admitting: "Endocrinology

## 2022-01-20 LAB — COMPREHENSIVE METABOLIC PANEL
ALT: 11 IU/L (ref 0–44)
AST: 16 IU/L (ref 0–40)
Albumin/Globulin Ratio: 1.5 (ref 1.2–2.2)
Albumin: 4 g/dL (ref 3.7–4.7)
Alkaline Phosphatase: 72 IU/L (ref 44–121)
BUN/Creatinine Ratio: 20 (ref 10–24)
BUN: 25 mg/dL (ref 8–27)
Bilirubin Total: 0.4 mg/dL (ref 0.0–1.2)
CO2: 23 mmol/L (ref 20–29)
Calcium: 9.3 mg/dL (ref 8.6–10.2)
Chloride: 103 mmol/L (ref 96–106)
Creatinine, Ser: 1.28 mg/dL — ABNORMAL HIGH (ref 0.76–1.27)
Globulin, Total: 2.7 g/dL (ref 1.5–4.5)
Glucose: 82 mg/dL (ref 70–99)
Potassium: 4.6 mmol/L (ref 3.5–5.2)
Sodium: 141 mmol/L (ref 134–144)
Total Protein: 6.7 g/dL (ref 6.0–8.5)
eGFR: 56 mL/min/{1.73_m2} — ABNORMAL LOW (ref >59)

## 2022-01-20 LAB — LIPID PANEL
Chol/HDL Ratio: 2.5 ratio (ref 0.0–5.0)
Cholesterol, Total: 130 mg/dL (ref 100–199)
HDL: 51 mg/dL (ref >39)
LDL Chol Calc (NIH): 66 mg/dL (ref 0–99)
Triglycerides: 64 mg/dL (ref 0–149)
VLDL Cholesterol Cal: 13 mg/dL (ref 5–40)

## 2022-01-20 LAB — T4, FREE: Free T4: 1.32 ng/dL (ref 0.82–1.77)

## 2022-01-20 LAB — TSH: TSH: 3.5 u[IU]/mL (ref 0.450–4.500)

## 2022-01-26 ENCOUNTER — Ambulatory Visit (INDEPENDENT_AMBULATORY_CARE_PROVIDER_SITE_OTHER): Payer: Medicare Other | Admitting: "Endocrinology

## 2022-01-26 ENCOUNTER — Encounter: Payer: Self-pay | Admitting: "Endocrinology

## 2022-01-26 VITALS — BP 118/70 | HR 68 | Ht 70.0 in | Wt 213.4 lb

## 2022-01-26 DIAGNOSIS — I10 Essential (primary) hypertension: Secondary | ICD-10-CM | POA: Diagnosis not present

## 2022-01-26 DIAGNOSIS — E039 Hypothyroidism, unspecified: Secondary | ICD-10-CM | POA: Diagnosis not present

## 2022-01-26 DIAGNOSIS — E1165 Type 2 diabetes mellitus with hyperglycemia: Secondary | ICD-10-CM

## 2022-01-26 DIAGNOSIS — E782 Mixed hyperlipidemia: Secondary | ICD-10-CM | POA: Diagnosis not present

## 2022-01-26 LAB — POCT GLYCOSYLATED HEMOGLOBIN (HGB A1C): HbA1c, POC (controlled diabetic range): 8.5 % — AB (ref 0.0–7.0)

## 2022-01-26 MED ORDER — LANTUS SOLOSTAR 100 UNIT/ML ~~LOC~~ SOPN: 44.0000 [IU] | PEN_INJECTOR | Freq: Every day | SUBCUTANEOUS | 2 refills | Status: DC

## 2022-01-26 NOTE — Progress Notes (Unsigned)
01/26/2022, 9:30 PM  Endocrinology follow-up note   Subjective:    Patient ID: Jonathan Hensley, male    DOB: [birth year].  Jonathan Hensley is being seen in follow-up for the management of currently uncontrolled type 2 diabetes, hypothyroidism, hypertension, hyperlipidemia.   Patient was previously seen by [name] for endocrinology care.  Past Medical History:  Diagnosis Date   Arthritis    rheumatoid in his hands   Atrial fibrillation (HCC)    Bulging lumbar disc    Cancer ([place])    DDD (degenerative disc disease), cervical    Diabetes mellitus    Diabetic retinopathy ([place])    Double vision    Dysrhythmia    Atrial Fibrillation   History of prostate cancer    Hypertension    Low back pain    Nephrolithiasis    Sleep apnea    uses cpap    Past Surgical History:  Procedure Laterality Date   APPENDECTOMY     BACK SURGERY     Bilateral L4-5 lumbar decompression including bilateral  11/2008   brachytherapy     COLONOSCOPY     CYSTOSCOPY     Left L4-L5 lumbar laminotomy and microdiskectomy with  07/14/2008     LITHOTRIPSY     LUMBAR LAMINECTOMY/DECOMPRESSION MICRODISCECTOMY Left 08/18/2013   Procedure: Left Lumbar Five-Sacral One Microdiskectomy;  Surgeon: [name], MD;  Location: [facility];  Service: Neurosurgery;  Laterality: Left;  Left Lumbar Five-Sacral One Microdiskectomy   rotator cuff surgery     Scleral buckle, laser photocoagulation, and anterior chamber  01/04/2009    Social History   Socioeconomic History   Marital status: Married    Spouse name: [name]   Number of children: 2   Years of education: 12   Highest education level: Not on file  Occupational History   Not on file  Tobacco Use   Smoking status: Former    Types: Cigarettes    Quit date: 02/12/1973    Years since quitting: 48.9   Smokeless tobacco: Never   Tobacco comments:    starting at age 67   Vaping Use   Vaping Use: Never used  Substance and Sexual Activity   Alcohol use: No   Drug use: No   Sexual activity: Yes  Other Topics Concern   Not on file  Social History Narrative   Lives at home with wife   Drinks 4 cups of coffee a day   Social Determinants of Health   Financial Resource Strain: Not on file  Food Insecurity: Not on file  Transportation Needs: Not on file  Physical Activity: Not on file  Stress: Not on file  Social Connections: Not on file    Family History  Problem Relation Age of Onset   Heart failure Father    Heart failure Mother    Heart failure Brother        Half brother.   Colon cancer Neg Hx     Outpatient Encounter Medications as of 01/26/2022  Medication Sig   atorvastatin (LIPITOR) 10 MG tablet TAKE 1 TABLET DAILY  B-D ULTRAFINE III SHORT PEN 31G X 8 MM MISC USE 1 PEN NEEDLE AS DIRECTED   Blood Glucose Monitoring Suppl (FREESTYLE FREEDOM LITE) w/Device KIT 1 each by Does not apply route 4 (four) times daily. USE BLOOD GLUCOSE MONITOR TO CHECK BLOOD SUGAR.   diltiazem (CARDIZEM CD) 240 MG 24 hr capsule TAKE 1 CAPSULE DAILY   ELIQUIS 5 MG TABS tablet TAKE 1 TABLET TWICE A DAY   ezetimibe (ZETIA) 10 MG tablet TAKE 1 TABLET EVERY EVENING AFTER DINNER   GLIPIZIDE XL 5 MG 24 hr tablet TAKE 1 TABLET DAILY WITH BREAKFAST   glucose blood (FREESTYLE LITE) test strip USE TO CHECK BLOOD SUGAR THREE TIMES A DAY AS INSTRUCTED   insulin glargine (LANTUS SOLOSTAR) 100 UNIT/ML Solostar Pen Inject 44 Units into the skin at bedtime.   Lancets (FREESTYLE) lancets Use as instructed to check blood sugar 4 times per day dx code 250.00   metoprolol succinate (TOPROL-XL) 25 MG 24 hr tablet TAKE 1 TABLET DAILY WITH OR IMMEDIATELY FOLLOWING A MEAL   SYNTHROID 25 MCG tablet Take 1 tablet (25 mcg total) by mouth daily before breakfast.   [DISCONTINUED] LANTUS SOLOSTAR 100 UNIT/ML Solostar Pen INJECT 40 UNITS UNDER THE SKIN AT BEDTIME   No facility-administered  encounter medications on file as of 01/26/2022.    ALLERGIES: Allergies  Allergen Reactions   Beta Adrenergic Blockers Other (See Comments)    Severe dizziness   Other Other (See Comments)    Beta or alpha blockers: severe dizziness   Tramadol Other (See Comments)    Dizziness     VACCINATION STATUS: Immunization History  Administered Date(s) Administered   Influenza Split 02/12/2013   Influenza, High Dose Seasonal PF 03/26/2018   Influenza,inj,Quad PF,6+ Mos 02/26/2015   Influenza-Unspecified 02/17/2014   Pneumococcal Conjugate-13 11/26/2014   Tdap 07/21/2012    Diabetes He presents for his follow-up diabetic visit. He has type 2 diabetes mellitus. Onset time: He was diagnosed at approximate age of 81 years. His disease course has been fluctuating. There are no hypoglycemic associated symptoms. Pertinent negatives for hypoglycemia include no confusion, headaches, pallor or seizures. Pertinent negatives for diabetes include no chest pain, no fatigue, no polydipsia, no polyphagia, no polyuria and no weakness. There are no hypoglycemic complications. Symptoms are worsening. Diabetic complications include nephropathy and retinopathy. Risk factors for coronary artery disease include dyslipidemia, family history, male sex, sedentary lifestyle and tobacco exposure. Current diabetic treatment includes insulin injections and oral agent (monotherapy) (He is currently on Lantus 36 units nightly, glipizide 5 mg daily. ). He is compliant with treatment most of the time. His weight is increasing steadily. He is following a generally unhealthy diet. When asked about meal planning, he reported none. He has not had a previous visit with a dietitian. He participates in exercise intermittently. His home blood glucose trend is fluctuating minimally. His bedtime blood glucose range is generally >200 mg/dl. His overall blood glucose range is >200 mg/dl. (Patient is experiencing worsening cognitive decline.  He  is being cared for by his wife.  His point-of-care A1c today is 8.5%, unchanged from his last visit.    He did not document hypoglycemia, however he does not monitor blood glucose in the morning since he starts eating at 4 AM.     ) An ACE inhibitor/angiotensin II receptor blocker is being taken. Eye exam is current.  Hyperlipidemia This is a chronic problem. The current episode started more than 1 year ago. The problem is controlled. Exacerbating diseases  include diabetes and obesity. Pertinent negatives include no chest pain, myalgias or shortness of breath. Current antihyperlipidemic treatment includes statins. Risk factors for coronary artery disease include diabetes mellitus, dyslipidemia, hypertension, male sex and a sedentary lifestyle.  Hypertension This is a chronic problem. The current episode started more than 1 year ago. Pertinent negatives include no chest pain, headaches, neck pain, palpitations or shortness of breath. Risk factors for coronary artery disease include dyslipidemia, diabetes mellitus, male gender, obesity, sedentary lifestyle and smoking/tobacco exposure. Past treatments include ACE inhibitors. Hypertensive end-organ damage includes retinopathy.     Review of systems  Constitutional: + Minimally fluctuating body weight,  current  Body mass index is 30.62 kg/m. , no fatigue, no subjective hyperthermia, no subjective hypothermia     Objective:    BP 118/70   Pulse 68   Ht _0  (1.778 m)   Wt 213 lb 6.4 oz (96.8 kg)   BMI 30.62 kg/m   Wt Readings from Last 3 Encounters:  01/26/22 213 lb 6.4 oz (96.8 kg)  09/08/21 221 lb (100.2 kg)  06/29/21 218 lb 9.6 oz (99.2 kg)    Physical Exam- Limited  Constitutional:  Body mass index is 30.62 kg/m. , not in acute distress, normal state of mind    CMP     Component Value Date/Time   NA 141 01/19/2022 0845   K 4.6 01/19/2022 0845   CL 103 01/19/2022 0845   CO2 23 01/19/2022 0845   GLUCOSE 82 01/19/2022  0845   GLUCOSE 170 (H) 03/25/2020 0752   BUN 25 01/19/2022 0845   CREATININE 1.28 (H) 01/19/2022 0845   CREATININE 1.50 (H) 03/25/2020 0752   CALCIUM 9.3 01/19/2022 0845   PROT 6.7 01/19/2022 0845   ALBUMIN 4.0 01/19/2022 0845   AST 16 01/19/2022 0845   ALT 11 01/19/2022 0845   ALKPHOS 72 01/19/2022 0845   BILITOT 0.4 01/19/2022 0845   GFRNONAA 44 (L) 03/25/2020 0752   GFRAA 51 (L) 03/25/2020 0752     Diabetic Labs (most recent): Lab Results  Component Value Date   HGBA1C 8.5 (A) 01/26/2022   HGBA1C 8.7 (A) 09/08/2021   HGBA1C 8.6 (A) 06/09/2021   MICROALBUR 0.4 09/16/2019   MICROALBUR 0.4 09/30/2018   MICROALBUR 0.8 09/20/2017     Lipid Panel ( most recent) Lipid Panel     Component Value Date/Time   CHOL 130 01/19/2022 0845   TRIG 64 01/19/2022 0845   HDL 51 01/19/2022 0845   CHOLHDL 2.5 01/19/2022 0845   CHOLHDL 2.8 02/11/2020 1000   VLDL 12 02/11/2020 1000   LDLCALC 66 01/19/2022 0845   LDLCALC 130 (H) 09/16/2019 0809      Lab Results  Component Value Date   TSH 3.500 01/19/2022   TSH 3.870 06/02/2021   TSH 5.410 (H) 10/01/2020   TSH 5.54 (H) 03/25/2020   TSH 3.70 09/16/2019   TSH 3.05 01/14/2019   TSH 4.80 (H) 09/30/2018   TSH 3.27 12/21/2017   TSH 2.60 06/11/2017   TSH 2.36 05/22/2016   FREET4 1.32 01/19/2022   FREET4 1.24 06/02/2021   FREET4 1.15 10/01/2020   FREET4 1.0 03/25/2020   FREET4 1.0 09/16/2019   FREET4 1.2 01/14/2019   FREET4 0.9 09/30/2018   FREET4 0.92 06/11/2017   FREET4 0.95 05/22/2016   FREET4 0.92 06/16/2015      Assessment & Plan:   1. Uncontrolled type 2 diabetes mellitus with hyperglycemia ([place])  - Jonathan Hensley has currently uncontrolled symptomatic type 2 DM  since  81 years of age.  Patient is experiencing worsening cognitive decline.  He is being cared for by his wife.  His point-of-care A1c today is 8.5%, unchanged from his last visit.    He did not document hypoglycemia, however he does not monitor blood  glucose in the morning since he starts eating at 4 AM.     -his diabetes is complicated by retinopathy and he remains at a high risk for more acute and chronic complications which include CAD, CVA, CKD, retinopathy, and neuropathy. These are all discussed in detail with him.  - I have counseled him on diet management and weight loss, by adopting a carbohydrate restricted/protein rich diet.  - he acknowledges that there is a room for improvement in his food and drink choices. - Suggestion is made for him to avoid simple carbohydrates  from his diet including Cakes, Sweet Desserts, Ice Cream, Soda (diet and regular), Sweet Tea, Candies, Chips, Cookies, Store Bought Juices, Alcohol , Artificial Sweeteners,  Coffee Creamer, and "Sugar-free" Products, Lemonade. This will help patient to have more stable blood glucose profile and potentially avoid unintended weight gain.  The following Lifestyle Medicine recommendations according to [place]  [facility]) were discussed and and offered to patient and he  agrees to start the journey:  A. Whole Foods, Plant-Based Nutrition comprising of fruits and vegetables, plant-based proteins, whole-grain carbohydrates was discussed in detail with the patient.   A list for source of those nutrients were also provided to the patient.  Patient will use only water or unsweetened tea for hydration. B.  The need to stay away from risky substances including alcohol, smoking; obtaining 7 to 9 hours of restorative sleep, at least 150 minutes of moderate intensity exercise weekly, the importance of healthy social connections,  and stress management techniques were discussed.  - he is advised to stick to a routine mealtimes to eat 3 meals  a day and avoid unnecessary snacks ( to snack only to correct hypoglycemia).   - I have approached him with the following individualized plan to manage diabetes and patient agrees:    -Priority in this patient is to  avoid hypoglycemia which the family or is about to get   He does not monitor blood glucose in the morning due to the fact that he starts eating at 4 AM.  His wife is not awake at that time to check for him.  He is advised to increase his Lantus slightly to 44 units nightly, advised to continue glipizide 5 mg daily with breakfast.  He is care is assisted in coordinated by his wife.  -I requested for the couple to monitor blood glucose at least twice a day as much as possible-daily before breakfast and at bedtime.  The low-dose glipizide may help control postprandial glycemic profile.  He is advised to continue glipizide 5 mg p.o. daily at breakfast    - Patient specific target  A1c;  LDL, HDL, Triglycerides,  were discussed in detail.  2) Blood Pressure /Hypertension: -His blood pressure is controlled to target.  he is advised to continue his current medications including benazepril 40 mg p.o. daily with breakfast.  3) Lipids/Hyperlipidemia: Review of his recent lipid panel show improved LDL at 66, improving from 130.  He is asked to continue simvastatin 40 mg p.o. daily at bedtime.   Also on Zetia 10 mg p.o. nightly.     Side effects and precautions discussed with him.  4)  Weight/Diet:  His BMI is 31.2.       He is a candidate for modest weight loss.  I discussed with him the fact that loss of 5 - 10% of his  current body weight will have the most impact on his diabetes management.  CDE Consult will be initiated . Exercise, and detailed carbohydrates information provided  -  detailed on discharge instructions.   5) Hypothyroidism:  His recent thyroid function tests were consistent with appropriate replacement.  He is advised to continue Synthroid 25 mcg p.o. daily before breakfast.   - We discussed about the correct intake of his thyroid hormone, on empty stomach at fasting, with water, separated by at least 30 minutes from breakfast and other medications,  and separated by more than 4  hours from calcium, iron, multivitamins, acid reflux medications (PPIs). -Patient is made aware of the fact that thyroid hormone replacement is needed for life, dose to be adjusted by periodic monitoring of thyroid function tests.    6) Chronic Care/Health Maintenance:  -he  is on ACEI/ARB and Statin medications and  is encouraged to initiate and continue to follow up with Ophthalmology, Dentist,  Podiatrist at least yearly or according to recommendations, and advised to   stay away from smoking. I have recommended yearly flu vaccine and pneumonia vaccine at least every 5 years; moderate intensity exercise for up to 150 minutes weekly; and  sleep for at least 7 hours a day.   - he is  advised to maintain close follow up with his PMD  for primary care needs, as well as his other providers for optimal and coordinated care.   I spent 41 minutes in the care of the patient today including review of labs from [facility], Lipids, Thyroid Function, Hematology (current and previous including abstractions from other facilities); face-to-face time discussing  his blood glucose readings/logs, discussing hypoglycemia and hyperglycemia episodes and symptoms, medications doses, his options of short and long term treatment based on the latest standards of care / guidelines;  discussion about incorporating lifestyle medicine;  and documenting the encounter. Risk reduction counseling performed per USPSTF guidelines to reduce  obesity and cardiovascular risk factors.     Please refer to Patient Instructions for Blood Glucose Monitoring and Insulin/Medications Dosing Guide"  in media tab for additional information. Please  also refer to " Patient Self Inventory" in the Media  tab for reviewed elements of pertinent patient history.  Jonathan Hensley and his wife Ms. Xia participated in the discussions, expressed understanding, and voiced agreement with the above plans.  All questions were answered to his satisfaction. he is  encouraged to contact clinic should he have any questions or concerns prior to his return visit.   Follow up plan: - Return in about 6 months (around 07/27/2022) for F/U with [facility] Only - no Labs.  Glade Lloyd, MD [facility] [address] [place], [place] [address] Phone: [phone]  Fax: [phone]    01/26/2022, 9:30 PM  This note was partially dictated with voice recognition software. Similar sounding words can be transcribed inadequately or may not  be corrected upon review.

## 2022-01-28 ENCOUNTER — Encounter (HOSPITAL_COMMUNITY): Payer: Self-pay | Admitting: Emergency Medicine

## 2022-01-28 ENCOUNTER — Emergency Department (HOSPITAL_COMMUNITY)
Admission: EM | Admit: 2022-01-28 | Discharge: 2022-01-29 | Disposition: A | Discharge status: Home / Self Care | Payer: Medicare Other | Attending: Emergency Medicine | Admitting: Emergency Medicine

## 2022-01-28 ENCOUNTER — Emergency Department (HOSPITAL_COMMUNITY): Payer: Medicare Other

## 2022-01-28 ENCOUNTER — Other Ambulatory Visit: Payer: Self-pay

## 2022-01-28 DIAGNOSIS — Z794 Long term (current) use of insulin: Secondary | ICD-10-CM | POA: Diagnosis not present

## 2022-01-28 DIAGNOSIS — E119 Type 2 diabetes mellitus without complications: Secondary | ICD-10-CM | POA: Insufficient documentation

## 2022-01-28 DIAGNOSIS — R413 Other amnesia: Secondary | ICD-10-CM | POA: Diagnosis present

## 2022-01-28 DIAGNOSIS — F0390 Unspecified dementia without behavioral disturbance: Secondary | ICD-10-CM | POA: Insufficient documentation

## 2022-01-28 DIAGNOSIS — Z8546 Personal history of malignant neoplasm of prostate: Secondary | ICD-10-CM | POA: Diagnosis not present

## 2022-01-28 DIAGNOSIS — Z7901 Long term (current) use of anticoagulants: Secondary | ICD-10-CM | POA: Insufficient documentation

## 2022-01-28 DIAGNOSIS — I10 Essential (primary) hypertension: Secondary | ICD-10-CM | POA: Diagnosis not present

## 2022-01-28 DIAGNOSIS — E538 Deficiency of other specified B group vitamins: Secondary | ICD-10-CM | POA: Insufficient documentation

## 2022-01-28 DIAGNOSIS — Z79899 Other long term (current) drug therapy: Secondary | ICD-10-CM | POA: Diagnosis not present

## 2022-01-28 LAB — CBC
HCT: 36 % — ABNORMAL LOW (ref 39.0–52.0)
Hemoglobin: 11.9 g/dL — ABNORMAL LOW (ref 13.0–17.0)
MCH: 31.4 pg (ref 26.0–34.0)
MCHC: 33.1 g/dL (ref 30.0–36.0)
MCV: 95 fL (ref 80.0–100.0)
Platelets: 256 10*3/uL (ref 150–400)
RBC: 3.79 MIL/uL — ABNORMAL LOW (ref 4.22–5.81)
RDW: 13.1 % (ref 11.5–15.5)
WBC: 8.4 10*3/uL (ref 4.0–10.5)
nRBC: 0 % (ref 0.0–0.2)

## 2022-01-28 LAB — COMPREHENSIVE METABOLIC PANEL
ALT: 16 U/L (ref 0–44)
AST: 19 U/L (ref 15–41)
Albumin: 3.6 g/dL (ref 3.5–5.0)
Alkaline Phosphatase: 68 U/L (ref 38–126)
Anion gap: 13 (ref 5–15)
BUN: 26 mg/dL — ABNORMAL HIGH (ref 8–23)
CO2: 21 mmol/L — ABNORMAL LOW (ref 22–32)
Calcium: 9.4 mg/dL (ref 8.9–10.3)
Chloride: 104 mmol/L (ref 98–111)
Creatinine, Ser: 1.31 mg/dL — ABNORMAL HIGH (ref 0.61–1.24)
GFR, Estimated: 55 mL/min — ABNORMAL LOW (ref >60)
Glucose, Bld: 326 mg/dL — ABNORMAL HIGH (ref 70–99)
Potassium: 4.2 mmol/L (ref 3.5–5.1)
Sodium: 138 mmol/L (ref 135–145)
Total Bilirubin: 0.7 mg/dL (ref 0.3–1.2)
Total Protein: 7 g/dL (ref 6.5–8.1)

## 2022-01-28 LAB — TSH: TSH: 2.698 u[IU]/mL (ref 0.350–4.500)

## 2022-01-28 LAB — SALICYLATE LEVEL: Salicylate Lvl: <7 mg/dL — ABNORMAL LOW (ref 7.0–30.0)

## 2022-01-28 LAB — VITAMIN B12: Vitamin B-12: 166 pg/mL — ABNORMAL LOW (ref 180–914)

## 2022-01-28 LAB — ACETAMINOPHEN LEVEL: Acetaminophen (Tylenol), Serum: <10 ug/mL — ABNORMAL LOW (ref 10–30)

## 2022-01-28 LAB — ETHANOL: Alcohol, Ethyl (B): <10 mg/dL (ref <10)

## 2022-01-28 NOTE — ED Provider Triage Note (Signed)
Emergency Medicine Provider Triage Evaluation Note  Jonathan Hensley , a 81 y.o. male  was evaluated in triage.  Pt complains of increasing memory issues and behaviors at home.  Family has been in contact with the [place] who reportedly told patient that he should be evaluated in the ED.  Most of the history was given by wife and daughter.  Review of Systems  Positive: As above Negative: Falls, losses of consciousness  Physical Exam  BP (!) 140/76 (BP Location: Right Arm)   Pulse 73   Temp 98.1 F (36.7 C) (Oral)   Resp 16   SpO2 99%  Gen:   Awake, no distress   Resp:  Normal effort  MSK:   Moves extremities without difficulty  Other:  Patient did become upset when discussing the need for labs and imaging  Medical Decision Making  Medically screening exam initiated at 5:40 PM.  Appropriate orders placed.  [name] was informed that the remainder of the evaluation will be completed by another provider, this initial triage assessment does not replace that evaluation, and the importance of remaining in the ED until their evaluation is complete.  We will obtain ED AMS work-up   Roylene Reason, Hershal Coria 01/28/22 1742

## 2022-01-28 NOTE — ED Triage Notes (Addendum)
Patient brought by spouse after going to [facility] for memory issues of increasing frequency. Spouse states they were told to go to [facility] for evaluation of dementia. Spouse states [place] told her that they need an evaluation from [facility] to pay for long term memory care services. Spouse states patient has intermittent episodes of confusion and agitation, states today the patient attempted to jump from the balcony of their home to the ground. Patient is calm and cooperative and states he does not recall this event and is unsure of why he is here.  If needed, contact at [facility] is Margaree Mackintosh and can be reached at [phone]

## 2022-01-28 NOTE — ED Notes (Signed)
Pt family stated it will be a while for him to pee

## 2022-01-29 ENCOUNTER — Other Ambulatory Visit: Payer: Self-pay

## 2022-01-29 ENCOUNTER — Encounter (HOSPITAL_COMMUNITY): Payer: Self-pay

## 2022-01-29 DIAGNOSIS — F0390 Unspecified dementia without behavioral disturbance: Secondary | ICD-10-CM | POA: Diagnosis not present

## 2022-01-29 MED ORDER — ZIPRASIDONE MESYLATE 20 MG IM SOLR
INTRAMUSCULAR | Status: AC
  Administered 2022-01-29: 10 mg via INTRAMUSCULAR
  Filled 2022-01-29: qty 20

## 2022-01-29 MED ORDER — CYANOCOBALAMIN 1000 MCG/ML IJ SOLN
2000.0000 ug | Freq: Once | INTRAMUSCULAR | Status: AC
  Administered 2022-01-29: 2000 ug via INTRAMUSCULAR
  Filled 2022-01-29: qty 2

## 2022-01-29 MED ORDER — MEMANTINE HCL 10 MG PO TABS: 10.0000 mg | ORAL_TABLET | Freq: Every day | ORAL | 0 refills | Status: DC

## 2022-01-29 MED ORDER — STERILE WATER FOR INJECTION IJ SOLN
INTRAMUSCULAR | Status: AC
  Administered 2022-01-29: 10 mL
  Filled 2022-01-29: qty 10

## 2022-01-29 MED ORDER — LORAZEPAM 1 MG PO TABS
0.5000 mg | ORAL_TABLET | Freq: Once | ORAL | Status: AC
  Administered 2022-01-29: 0.5 mg via ORAL
  Filled 2022-01-29: qty 1

## 2022-01-29 MED ORDER — VITAMIN B-12 1000 MCG PO TABS: 1000.0000 ug | ORAL_TABLET | Freq: Every day | ORAL | 0 refills | Status: DC

## 2022-01-29 MED ORDER — MEMANTINE HCL 10 MG PO TABS
10.0000 mg | ORAL_TABLET | Freq: Once | ORAL | Status: AC
  Administered 2022-01-29: 10 mg via ORAL
  Filled 2022-01-29: qty 1

## 2022-01-29 MED ORDER — ZIPRASIDONE MESYLATE 20 MG IM SOLR: 10.0000 mg | Freq: Once | INTRAMUSCULAR | Status: AC

## 2022-01-29 NOTE — ED Notes (Signed)
Pt walked out of room asking when he was going home, pt educated that we are waiting on his wife to be able to pick him up shortly. Pt redirected to the room and taken to the bathroom.

## 2022-01-29 NOTE — ED Provider Notes (Signed)
Signed out that [facility] was consulted but otherwise ready for d/c.   Toc indicates they are unable to facilitate any type of placement from ED - they recommend [facility] f[facility] and [facility] - and for those entities to work with patient and family if placement is desired.   Pt currently appears at baseline, and stable for d/c.      Lajean Saver, MD 01/29/22 [phone]

## 2022-01-29 NOTE — Evaluation (Signed)
Physical Therapy Evaluation and Discharge Patient Details Name: Jonathan Hensley MRN: [id number] DOB: 1940/09/03 Today's Date: 01/29/2022  History of Present Illness  This is an 81 year old male who presents with concerns for worsening dementia and agitation.  He presents primarily with his wife who provides history.  She is under the impression that he needs a 72-hour hold to qualify for long-term care at the [facility].  Clinical Impression   Patient evaluated by Physical Therapy with no further acute PT needs identified, as he seems to be at baseline level of function; All education has been completed and the patient has no further questions. Noted plan is for pt and wife to seek long-term care through the [facility];  See below for any follow-up Physical Therapy or equipment needs. PT is signing off. Thank you for this referral.        Recommendations for follow up therapy are one component of a multi-disciplinary discharge planning process, led by the attending physician.  Recommendations may be updated based on patient status, additional functional criteria and insurance authorization.  Follow Up Recommendations Long-term institutional care without follow-up therapy Can patient physically be transported by private vehicle: Yes    Assistance Recommended at Discharge Intermittent Supervision/Assistance  Patient can return home with the following  Assistance with cooking/housework;Assist for transportation    Equipment Recommendations None recommended by PT  Recommendations for Other Services       Functional Status Assessment Patient has not had a recent decline in their functional status     Precautions / Restrictions Precautions Precautions: Fall Precaution Comments: Fall risk is present, but minimal Restrictions Weight Bearing Restrictions: No      Mobility  Bed Mobility Overal bed mobility: Needs Assistance Bed Mobility: Supine to Sit     Supine to sit: Min assist     General bed  mobility comments: Min handheld assist to pull to sit; likely needing assist due to different nature of the stretcher he was on    Transfers Overall transfer level: Needs assistance Equipment used: 1 person hand held assist Transfers: Sit to/from Stand, Bed to chair/wheelchair/BSC Sit to Stand: Min assist           General transfer comment: Handheld assist to steady, and also to invite/encourage pt to stand up and take some steps; took a few small sidesteps up to head of stretcher (which can serve as a simulation of step pivot transfer)    Ambulation/Gait               General Gait Details: When this PT invited pt to walk, he smiled, hapilly declined, and kicked his shoes off; then sat down to the stretcher; Discussed pt's mobility status with RN, and she indicated he has walked to/from the bathroom without difficulty  Stairs            Wheelchair Mobility    Modified Rankin (Stroke Patients Only)       Balance Overall balance assessment: Mild deficits observed, not formally tested                                           Pertinent Vitals/Pain Pain Assessment Pain Assessment: Faces Pain Score: 0-No pain    Home Living Family/patient expects to be discharged to:: Private residence Living Arrangements: Spouse/significant other Available Help at Discharge: Family Type of Home: House  Additional Comments: Exact setup of home is unclear, although char review reveals single level home with a few steps to enter (this was as of 2013)    Prior Function Prior Level of Function : Patient poor historian/Family not available                     Hand Dominance   Dominant Hand: Right    Extremity/Trunk Assessment   Upper Extremity Assessment Upper Extremity Assessment: Overall WFL for tasks assessed (for simple tasks)    Lower Extremity Assessment Lower Extremity Assessment: Overall WFL for tasks assessed (for simple  tasks)    Cervical / Trunk Assessment Cervical / Trunk Assessment: Normal  Communication   Communication: Expressive difficulties (diagnosis of primary progressive aphasia)  Cognition Arousal/Alertness: Awake/alert (Woke pt from a nap, but once up he did not appear sleepy) Behavior During Therapy: WFL for tasks assessed/performed Overall Cognitive Status: No family/caregiver present to determine baseline cognitive functioning                                 General Comments: noting diagnosis of dementia        General Comments General comments (skin integrity, edema, etc.): Setup pt with lunch, which had been delivered to bedside; he began with his spoon, first dipping the spoon in the tea, then into dessert; no fork was available, and RN showed me where to get one    Exercises     Assessment/Plan    PT Assessment Patient does not need any further PT services  PT Problem List         PT Treatment Interventions      PT Goals (Current goals can be found in the Care Plan section)  Acute Rehab PT Goals Patient Stated Goal: Indicated he was hungry, and wanted to eat PT Goal Formulation: All assessment and education complete, DC therapy    Frequency       Co-evaluation               AM-PAC PT "6 Clicks" Mobility  Outcome Measure Help needed turning from your back to your side while in a flat bed without using bedrails?: None Help needed moving from lying on your back to sitting on the side of a flat bed without using bedrails?: A Little Help needed moving to and from a bed to a chair (including a wheelchair)?: A Little Help needed standing up from a chair using your arms (e.g., wheelchair or bedside chair)?: None Help needed to walk in hospital room?: None Help needed climbing 3-5 steps with a railing? : A Little 6 Click Score: 21    End of Session   Activity Tolerance: Patient tolerated treatment well Patient left: with call bell/phone within  reach;Other (comment) (sitting/dangling edge of stretcher, eating lunch) Nurse Communication: Mobility status PT Visit Diagnosis: Other (comment) (AMS)    Time: 9924-2683 PT Time Calculation (min) (ACUTE ONLY): 11 min   Charges:   PT Evaluation $PT Eval Low Complexity: [place], [place] [phone]   Colletta [place] 01/29/2022, 3:35 PM

## 2022-01-29 NOTE — Discharge Instructions (Addendum)
It was our pleasure to provide your ER care today - we hope that you feel better.  Take meds as prescribed. Drink plenty of fluids/stay well hydrated.   Follow up closely with primary care doctor in the next 1-2 days.  Our social work team indicates that should you desire extended care facility placement that you will need to reach out to your primary care doctor's office and [facility] to work with [facility] to facilitate that type of placement.   You may also discussed expanding home health services, or using care aide such as from [facility] or other similar agency to assist with care.   Return to ER if worse, new symptoms, fevers, chest pain, trouble breathing, or other concern.

## 2022-01-29 NOTE — Care Management (Signed)
Discussed with CSW, MD, nursing. Patient is for LTC, will have to follow up with PCP and the [facility] to facilitate this. Nursing said wife upset as [facility] this. Leadership made aware.

## 2022-01-29 NOTE — ED Notes (Signed)
PT resting at this time, RN attempt to call pt's wife with no response

## 2022-01-29 NOTE — ED Notes (Signed)
PT evaled pt, pt provided with lunch tray

## 2022-01-29 NOTE — ED Notes (Signed)
Family educated about social work Herbalist on speaking with [facility] leadership during buisiness hours for placement. Wife states she has the number to call. All discharge instrucitons reviewed and family expressed understanding

## 2022-01-29 NOTE — ED Provider Notes (Signed)
[facility] EMERGENCY DEPARTMENT Provider Note   CSN: [id number] Arrival date & time: 01/28/22  1707     History  Chief Complaint  Patient presents with  . Altered Mental Status    BRADEN CIMO is a 81 y.o. male.  HPI     This is an 81 year old male with a history of progressive primary aphasia who presents with his wife for concerns for increasing episodes of memory loss and paranoia.  Per the patient's wife she was told by his primary neurologist that he had a progressive form of dementia that "would not get any better."  She describes episodes where he becomes altered and does not recognize her.  During these episodes he appears paranoid and sometimes will say things like "why are you trying to kill me."  Reportedly he had an episode last night and was stating these things and stated "I am just going to jump off the balcony."  Patient does not recall saying these things and denies wanting to hurt himself but does have aphasia at baseline and does not answer all questions directed or appropriately.  Wife states that she spoke with the [facility] today.  They are trying to get him into long-term care" he needs to be on a 72-hour hold for them to get him into long-term care."  She has the contact for the social worker at the [facility].  She denies that he has ever been aggressive with her or try to harm her.  He has never tried to harm himself.  He is not on any alcohol or drugs.  Per the wife, he is not on any medications at this time for his dementia.  I reviewed his chart.  Outpatient neurology note indicates he should be on Namenda.  Wife states that he has never been on this medication.  It does state a diagnosis of presumed primary progressive aphasia.  It appears that a POA letter was sent by his primary neurologist in February 2023.  Home Medications Prior to Admission medications   Medication Sig Start Date End Date Taking? Authorizing Provider  atorvastatin (LIPITOR) 10 MG  tablet TAKE 1 TABLET DAILY Patient taking differently: Take 10 mg by mouth daily. 03/17/21  Yes Cantwell, Celeste C, PA-C  cyanocobalamin (VITAMIN B12) 1000 MCG tablet Take 1 tablet (1,000 mcg total) by mouth daily. 01/29/22  Yes Jeremia Groot, Barbette Hair, MD  diltiazem (CARDIZEM CD) 240 MG 24 hr capsule TAKE 1 CAPSULE DAILY Patient taking differently: Take 240 mg by mouth daily. 02/21/21  Yes Adrian Prows, MD  ELIQUIS 5 MG TABS tablet TAKE 1 TABLET TWICE A DAY Patient taking differently: Take 5 mg by mouth 2 (two) times daily. 05/10/21  Yes Adrian Prows, MD  ezetimibe (ZETIA) 10 MG tablet TAKE 1 TABLET EVERY EVENING AFTER DINNER Patient taking differently: Take 10 mg by mouth daily. 02/12/20  Yes Nida, Marella Chimes, MD  GLIPIZIDE XL 5 MG 24 hr tablet TAKE 1 TABLET DAILY WITH BREAKFAST Patient taking differently: Take 5 mg by mouth daily with breakfast. 12/15/21  Yes Nida, Marella Chimes, MD  insulin glargine (LANTUS SOLOSTAR) 100 UNIT/ML Solostar Pen Inject 44 Units into the skin at bedtime. 01/26/22  Yes Nida, Marella Chimes, MD  memantine (NAMENDA) 10 MG tablet Take 1 tablet (10 mg total) by mouth daily. 01/29/22  Yes Kalem Rockwell, Barbette Hair, MD  metoprolol succinate (TOPROL-XL) 25 MG 24 hr tablet TAKE 1 TABLET DAILY WITH OR IMMEDIATELY FOLLOWING A MEAL Patient taking differently: Take 25 mg  by mouth daily. 07/29/21  Yes Adrian Prows, MD  SYNTHROID 25 MCG tablet Take 1 tablet (25 mcg total) by mouth daily before breakfast. 09/08/21  Yes Nida, Marella Chimes, MD  B-D ULTRAFINE III SHORT PEN 31G X 8 MM MISC USE 1 PEN NEEDLE AS DIRECTED 11/11/21   Nida, Marella Chimes, MD  Blood Glucose Monitoring Suppl (FREESTYLE FREEDOM LITE) w/Device KIT 1 each by Does not apply route 4 (four) times daily. USE BLOOD GLUCOSE MONITOR TO CHECK BLOOD SUGAR. 06/18/18   [name], MD  glucose blood (FREESTYLE LITE) test strip USE TO CHECK BLOOD SUGAR THREE TIMES A DAY AS INSTRUCTED 06/13/21   Cassandria Anger, MD  Lancets (FREESTYLE)  lancets Use as instructed to check blood sugar 4 times per day dx code 250.00 12/10/13   [name], MD      Allergies    Beta adrenergic blockers, Other, and Tramadol    Review of Systems   Review of Systems  Unable to perform ROS: Dementia    Physical Exam Updated Vital Signs BP (!) 152/100 (BP Location: Right Arm)   Pulse 90   Temp (!) 97.3 F (36.3 C) (Oral)   Resp 18   SpO2 98%  Physical Exam Vitals and nursing note reviewed.  Constitutional:      Appearance: He is well-developed. He is not ill-appearing.  HENT:     Head: Normocephalic and atraumatic.     Mouth/Throat:     Mouth: Mucous membranes are moist.  Eyes:     Pupils: Pupils are equal, round, and reactive to light.  Cardiovascular:     Rate and Rhythm: Normal rate and regular rhythm.     Heart sounds: Normal heart sounds. No murmur heard. Pulmonary:     Effort: Pulmonary effort is normal. No respiratory distress.     Breath sounds: Normal breath sounds. No wheezing.  Abdominal:     Palpations: Abdomen is soft.     Tenderness: There is no abdominal tenderness.  Musculoskeletal:     Cervical back: Neck supple.  Lymphadenopathy:     Cervical: No cervical adenopathy.  Skin:    General: Skin is warm and dry.  Neurological:     Mental Status: He is alert.     Comments: Speaks out of turn and at times inappropriately.  Does not answer questions directed at him.  Will follow simple commands and appears to moves all 4 extremities  Psychiatric:     Comments: Labile     ED Results / Procedures / Treatments   Labs (all labs ordered are listed, but only abnormal results are displayed) Labs Reviewed  COMPREHENSIVE METABOLIC PANEL - Abnormal; Notable for the following components:      Result Value   CO2 21 (*)    Glucose, Bld 326 (*)    BUN 26 (*)    Creatinine, Ser 1.31 (*)    GFR, Estimated 55 (*)    All other components within normal limits  SALICYLATE LEVEL - Abnormal; Notable for the following  components:   Salicylate Lvl <9.9 (*)    All other components within normal limits  ACETAMINOPHEN LEVEL - Abnormal; Notable for the following components:   Acetaminophen (Tylenol), Serum <10 (*)    All other components within normal limits  CBC - Abnormal; Notable for the following components:   RBC 3.79 (*)    Hemoglobin 11.9 (*)    HCT 36.0 (*)    All other components within normal limits  VITAMIN B12 -  Abnormal; Notable for the following components:   Vitamin B-12 166 (*)    All other components within normal limits  ETHANOL  TSH  RAPID URINE DRUG SCREEN, HOSP PERFORMED    EKG None  Radiology CT Head Wo Contrast  Result Date: 01/28/2022 CLINICAL DATA:  Altered mental status. EXAM: CT HEAD WITHOUT CONTRAST TECHNIQUE: Contiguous axial images were obtained from the base of the skull through the vertex without intravenous contrast. RADIATION DOSE REDUCTION: This exam was performed according to the departmental dose-optimization program which includes automated exposure control, adjustment of the mA and/or kV according to patient size and/or use of iterative reconstruction technique. COMPARISON:  MR brain dated 07/17/2016 and CT head dated 12/19/2011. FINDINGS: Brain: No evidence of acute infarction, hemorrhage, hydrocephalus, extra-axial collection or mass lesion/mass effect. There is mild cerebral volume loss with associated ex vacuo dilatation. Periventricular white matter hypoattenuation likely represents chronic small vessel ischemic disease. Chronic lacunar infarcts in the basal ganglia are redemonstrated. Vascular: There are vascular calcifications in the carotid siphons. Skull: Normal. Negative for fracture or focal lesion. Sinuses/Orbits: There is left maxillary and left sphenoid sinus disease. Other: None. IMPRESSION: No acute intracranial process. Electronically Signed   By: Zerita Boers M.D.   On: 01/28/2022 18:32    Procedures Procedures    Medications Ordered in  ED Medications  cyanocobalamin (VITAMIN B12) injection 2,000 mcg (2,000 mcg Intramuscular Given 01/29/22 0355)  memantine (NAMENDA) tablet 10 mg (10 mg Oral Given 01/29/22 0354)    ED Course/ Medical Decision Making/ A&P Clinical Course as of 01/29/22 3295  Sun Jan 29, 2022  0307 Spoke with neurologist here.  No additional inpatient testing required given outpatient diagnosis of primary progressive aphasia.  Would best be served by obtaining further evaluation by primary neurologist. [CH]  0345 Attempted to contact [facility].  Left voicemail. [CH]  [phone] Patient became more agitated after his wife left.  He did require medication for sedation to keep him safe. [CH]    Clinical Course User Index [CH] Kaithlyn Teagle, Barbette Hair, MD                           Medical Decision Making Amount and/or Complexity of Data Reviewed Labs: ordered.  Risk OTC drugs. Prescription drug management.   This patient presents to the ED for concern of progressive dementia, paranoia, this involves an extensive number of treatment options, and is a complaint that carries with it a high risk of complications and morbidity.  I considered the following differential and admission for this acute, potentially life threatening condition.  The differential diagnosis includes dementia, acute illness such as infection or UTI worsening underlying dementia  MDM:    This is an 81 year old male who presents with concerns for worsening dementia and agitation.  He presents primarily with his wife who provides history.  She is under the impression that he needs a 72-hour hold to qualify for long-term care at the [facility].  He has not threatened her but has had increasing episodes of agitation and has indicated paranoia that makes her fearful.  I reviewed his labs from triage.  B12 is low but otherwise his labs are largely unremarkable.  CT scan is reassuring.  Discussed with neurologist.  He states that it sounds like his diagnosis of progressive  aphasia is likely accurate and he is following the natural course of the disease.  He should follow-up with his neurologist urgently as an outpatient to get necessary documentation  for the [facility].  Neurology does recommend replacing B12.  We will also start Namenda as previously indicated by primary neurologist.  Wife is very distressed and fearful to take him home.  I discussed with her that he does not meet criteria for psychiatric hold although I do agree that he would benefit from long-term care.  We will have social work try to navigate and touch base with the social workers at the [facility] to get a better plan in place for establishing his needs.  I did tell the wife that he would likely be discharged after social work evaluation in the morning.  She stated understanding.  I sent prescriptions for B12 and Namenda to the pharmacy.  (Labs, imaging, consults)  Labs: I Ordered, and personally interpreted labs.  The pertinent results include: CBC, BMP, UA  Imaging Studies ordered: I ordered imaging studies including CT head I independently visualized and interpreted imaging. I agree with the radiologist interpretation  Additional history obtained from wife at bedside.  External records from outside source obtained and reviewed including patient neurology notes  Cardiac Monitoring: .The patient was maintained on a cardiac monitor.  I personally viewed and interpreted the cardiac monitored which showed an underlying rhythm of: Sinus rhythm  Reevaluation: After the interventions noted above, I reevaluated the patient and found that they have :stayed the same  Social Determinants of Health: .Progressive dementia  Disposition: Discharge after social work evaluation  Co morbidities that complicate the patient evaluation . Past Medical History:  Diagnosis Date  . Arthritis    rheumatoid in his hands  . Atrial fibrillation ([place])   . Bulging lumbar disc   . Cancer ([place])   . DDD (degenerative disc  disease), cervical   . Diabetes mellitus   . Diabetic retinopathy ([place])   . Double vision   . Dysrhythmia    Atrial Fibrillation  . History of prostate cancer   . Hypertension   . Low back pain   . Nephrolithiasis   . Sleep apnea    uses cpap     Medicines Meds ordered this encounter  Medications  . cyanocobalamin (VITAMIN B12) injection 2,000 mcg  . memantine (NAMENDA) tablet 10 mg  . memantine (NAMENDA) 10 MG tablet    Sig: Take 1 tablet (10 mg total) by mouth daily.    Dispense:  30 tablet    Refill:  0  . cyanocobalamin (VITAMIN B12) 1000 MCG tablet    Sig: Take 1 tablet (1,000 mcg total) by mouth daily.    Dispense:  30 tablet    Refill:  0    I have reviewed the patients home medicines and have made adjustments as needed  Problem List / ED Course: Problem List Items Addressed This Visit   None Visit Diagnoses     Progressive dementia with uncertain etiology ([place])    -  Primary   Relevant Medications   memantine (NAMENDA) tablet 10 mg (Completed)   memantine (NAMENDA) 10 MG tablet   B12 deficiency                       Final Clinical Impression(s) / ED Diagnoses Final diagnoses:  Progressive dementia with uncertain etiology ([place])  B12 deficiency    Rx / DC Orders ED Discharge Orders          Ordered    memantine (NAMENDA) 10 MG tablet  Daily        01/29/22 0411    cyanocobalamin (VITAMIN  B12) 1000 MCG tablet  Daily        01/29/22 0411              Merryl Hacker, MD 01/29/22 [phone]

## 2022-01-29 NOTE — Progress Notes (Signed)
CSW spoke with patient's wife Jonathan Hensley who states the patient has episodes of confusion and agitation. Jonathan Hensley states the patient sporadically states he is going to harm himself but does not have an actual plan. Jonathan Hensley requested CSW speak with [name] to initiate a transfer to the [facility] in [place] for LTC placement.  CSW spoke with [name], [facility] ED CSW who states that the patient's wife must follow up with the LTC supervisor during normal business hours for assistance with placement. [name] states patient can be placed from the home and that it does not have to occur in the ED setting.  CSW returned call to patient's wife to inform her of information. Jonathan Hensley will come and retrieve the patient between 12:30pm and 1:30pm today.  [name], MSW, LCSW Transitions of Care  Clinical Social Worker II [phone]

## 2022-01-29 NOTE — ED Notes (Signed)
Pt ambulated to the bathroom and back without assistance

## 2022-02-01 ENCOUNTER — Other Ambulatory Visit: Payer: Self-pay

## 2022-02-01 ENCOUNTER — Other Ambulatory Visit: Payer: Self-pay | Admitting: Cardiology

## 2022-02-01 DIAGNOSIS — I4819 Other persistent atrial fibrillation: Secondary | ICD-10-CM

## 2022-02-01 MED ORDER — ATORVASTATIN CALCIUM 10 MG PO TABS: 10.0000 mg | ORAL_TABLET | Freq: Every day | ORAL | 1 refills | Status: DC

## 2022-02-06 ENCOUNTER — Telehealth: Payer: Self-pay | Admitting: "Endocrinology

## 2022-02-06 NOTE — Telephone Encounter (Signed)
New message   Wife calling the patient does not have PCP.   The emergency room gave the patient scripts memantine (NAMENDA) 10 MG tablet.   Will Dr. Dorris Fetch write an ongoing script to [facility] for him.

## 2022-02-06 NOTE — Telephone Encounter (Signed)
Spoke with pt's wife and advised her that Dr.Nida does not prescribe namenda and pt would need to get established with a PCP because they would be who would refill the namenda. Understanding voiced per pt's wife.

## 2022-02-13 ENCOUNTER — Other Ambulatory Visit: Payer: Self-pay | Admitting: "Endocrinology

## 2022-02-27 ENCOUNTER — Other Ambulatory Visit: Payer: Self-pay | Admitting: "Endocrinology

## 2022-03-13 ENCOUNTER — Other Ambulatory Visit: Payer: Self-pay

## 2022-03-13 ENCOUNTER — Emergency Department (HOSPITAL_COMMUNITY): Payer: Medicare Other

## 2022-03-13 ENCOUNTER — Encounter (HOSPITAL_COMMUNITY): Payer: Self-pay

## 2022-03-13 ENCOUNTER — Emergency Department (HOSPITAL_COMMUNITY)
Admission: EM | Admit: 2022-03-13 | Discharge: 2022-03-13 | Disposition: A | Discharge status: Home / Self Care | Payer: Medicare Other | Attending: Emergency Medicine | Admitting: Emergency Medicine

## 2022-03-13 DIAGNOSIS — D72829 Elevated white blood cell count, unspecified: Secondary | ICD-10-CM | POA: Diagnosis not present

## 2022-03-13 DIAGNOSIS — R109 Unspecified abdominal pain: Secondary | ICD-10-CM | POA: Diagnosis present

## 2022-03-13 DIAGNOSIS — Z7901 Long term (current) use of anticoagulants: Secondary | ICD-10-CM | POA: Diagnosis not present

## 2022-03-13 DIAGNOSIS — K5900 Constipation, unspecified: Secondary | ICD-10-CM | POA: Diagnosis not present

## 2022-03-13 DIAGNOSIS — N201 Calculus of ureter: Secondary | ICD-10-CM | POA: Diagnosis not present

## 2022-03-13 DIAGNOSIS — Z794 Long term (current) use of insulin: Secondary | ICD-10-CM | POA: Insufficient documentation

## 2022-03-13 LAB — URINALYSIS, ROUTINE W REFLEX MICROSCOPIC
Bilirubin Urine: NEGATIVE
Glucose, UA: 50 mg/dL — AB
Ketones, ur: 5 mg/dL — AB
Leukocytes,Ua: NEGATIVE
Nitrite: NEGATIVE
Protein, ur: 100 mg/dL — AB
Specific Gravity, Urine: 1.023 (ref 1.005–1.030)
pH: 5 (ref 5.0–8.0)

## 2022-03-13 LAB — CBC WITH DIFFERENTIAL/PLATELET
Abs Immature Granulocytes: 0.02 10*3/uL (ref 0.00–0.07)
Basophils Absolute: 0 10*3/uL (ref 0.0–0.1)
Basophils Relative: 0 %
Eosinophils Absolute: 0 10*3/uL (ref 0.0–0.5)
Eosinophils Relative: 0 %
HCT: 39.6 % (ref 39.0–52.0)
Hemoglobin: 13.4 g/dL (ref 13.0–17.0)
Immature Granulocytes: 0 %
Lymphocytes Relative: 17 %
Lymphs Abs: 2.1 10*3/uL (ref 0.7–4.0)
MCH: 31.6 pg (ref 26.0–34.0)
MCHC: 33.8 g/dL (ref 30.0–36.0)
MCV: 93.4 fL (ref 80.0–100.0)
Monocytes Absolute: 1.1 10*3/uL — ABNORMAL HIGH (ref 0.1–1.0)
Monocytes Relative: 9 %
Neutro Abs: 9.2 10*3/uL — ABNORMAL HIGH (ref 1.7–7.7)
Neutrophils Relative %: 74 %
Platelets: 269 10*3/uL (ref 150–400)
RBC: 4.24 MIL/uL (ref 4.22–5.81)
RDW: 12.9 % (ref 11.5–15.5)
WBC: 12.5 10*3/uL — ABNORMAL HIGH (ref 4.0–10.5)
nRBC: 0 % (ref 0.0–0.2)

## 2022-03-13 LAB — COMPREHENSIVE METABOLIC PANEL
ALT: 16 U/L (ref 0–44)
AST: 16 U/L (ref 15–41)
Albumin: 4.1 g/dL (ref 3.5–5.0)
Alkaline Phosphatase: 65 U/L (ref 38–126)
Anion gap: 10 (ref 5–15)
BUN: 30 mg/dL — ABNORMAL HIGH (ref 8–23)
CO2: 25 mmol/L (ref 22–32)
Calcium: 9.6 mg/dL (ref 8.9–10.3)
Chloride: 101 mmol/L (ref 98–111)
Creatinine, Ser: 1.85 mg/dL — ABNORMAL HIGH (ref 0.61–1.24)
GFR, Estimated: 36 mL/min — ABNORMAL LOW (ref >60)
Glucose, Bld: 183 mg/dL — ABNORMAL HIGH (ref 70–99)
Potassium: 4.4 mmol/L (ref 3.5–5.1)
Sodium: 136 mmol/L (ref 135–145)
Total Bilirubin: 1 mg/dL (ref 0.3–1.2)
Total Protein: 8.2 g/dL — ABNORMAL HIGH (ref 6.5–8.1)

## 2022-03-13 LAB — CBG MONITORING, ED: Glucose-Capillary: 197 mg/dL — ABNORMAL HIGH (ref 70–99)

## 2022-03-13 LAB — LIPASE, BLOOD: Lipase: 24 U/L (ref 11–51)

## 2022-03-13 MED ORDER — ONDANSETRON HCL 4 MG PO TABS: 4.0000 mg | ORAL_TABLET | Freq: Four times a day (QID) | ORAL | 0 refills | Status: DC | PRN

## 2022-03-13 MED ORDER — SODIUM CHLORIDE 0.9 % IV BOLUS
500.0000 mL | Freq: Once | INTRAVENOUS | Status: AC
  Administered 2022-03-13: 500 mL via INTRAVENOUS

## 2022-03-13 MED ORDER — TAMSULOSIN HCL 0.4 MG PO CAPS: 0.4000 mg | ORAL_CAPSULE | Freq: Every day | ORAL | 0 refills | Status: DC

## 2022-03-13 MED ORDER — OXYCODONE-ACETAMINOPHEN 5-325 MG PO TABS: 1.0000 | ORAL_TABLET | Freq: Four times a day (QID) | ORAL | 0 refills | Status: DC | PRN

## 2022-03-13 NOTE — Discharge Instructions (Addendum)
It was a pleasure taking care of you today!   Your CT study showed concerns for stone today.  You will be sent a prescription for Zofran (nausea medication) to use as needed, you will be sent a prescription for Percocet to use for breakthrough pain.  Also be sent a prescription for Flomax.  Attached is information for on-call urologist, call and set up a follow-up appointment for this week.  Return to the emergency department for experiencing increasing/worsening symptoms.

## 2022-03-13 NOTE — ED Provider Triage Note (Signed)
Emergency Medicine Provider Triage Evaluation Note  Jonathan Hensley , a 81 y.o. male  was evaluated in triage.  Pt complains of constipation onset 3 days. Pt was seen at [facility] today and told to come into the ED. Patient denies urinary symptoms, however, it was noted blood and protein on his urine dipstick. No meds tried PTA. Has bilateral flank pain, L>R. No pain currently.  Review of Systems  Positive:  Negative:   Physical Exam  BP 110/60 (BP Location: Right Arm)   Pulse 98   Temp 97.7 F (36.5 C) (Oral)   Resp 20   Ht '5\' 10"'$  (1.778 m)   Wt 95.3 kg   SpO2 95%   BMI 30.13 kg/m  Gen:   Awake, no distress   Resp:  Normal effort  MSK:   Moves extremities without difficulty  Other:  No CVA TTP bilaterally. No abdominal TTP.  Medical Decision Making  Medically screening exam initiated at 1:21 PM.  Appropriate orders placed.  [name] was informed that the remainder of the evaluation will be completed by another provider, this initial triage assessment does not replace that evaluation, and the importance of remaining in the ED until their evaluation is complete.  Work-up initiated.    Cadi Rhinehart A, PA-C 03/13/22 1322

## 2022-03-13 NOTE — ED Triage Notes (Signed)
Pt presents to ED for left sided abdominal pain radiating around to back and constipation. Constipation since Saturday evening, took Senokot without relief. UA showed Glucose 500, +Protein and +blood. Was seen at [facility] and sent here.

## 2022-03-14 LAB — LIPID PANEL: HDL: 55 (ref 35–70)

## 2022-03-14 NOTE — ED Provider Notes (Signed)
[facility] EMERGENCY DEPARTMENT Provider Note   CSN: [id number] Arrival date & time: 03/13/22  1200     History  Chief Complaint  Patient presents with   Flank Pain    Jonathan Hensley is a 81 y.o. male who presents emergency department with concerns for constipation onset 3 days.  Patient was also evaluated urgent care and told to come to the emergency department due to concerns for kidney stone.  No meds tried prior to arrival.  Patient has associated bilateral flank pain left greater than right.  No pain currently.  Patient denies dysuria, hematuria, nausea, vomiting, abdominal pain.  The history is provided by the patient and the spouse. No language interpreter was used.       Home Medications Prior to Admission medications   Medication Sig Start Date End Date Taking? Authorizing Provider  ondansetron (ZOFRAN) 4 MG tablet Take 1 tablet (4 mg total) by mouth every 6 (six) hours as needed for nausea or vomiting. 03/13/22  Yes Murphy Bundick A, PA-C  oxyCODONE-acetaminophen (PERCOCET/ROXICET) 5-325 MG tablet Take 1 tablet by mouth every 6 (six) hours as needed for severe pain. 03/13/22  Yes Kyriakos Babler A, PA-C  tamsulosin (FLOMAX) 0.4 MG CAPS capsule Take 1 capsule (0.4 mg total) by mouth daily. 03/13/22  Yes Keria Widrig A, PA-C  atorvastatin (LIPITOR) 10 MG tablet Take 1 tablet (10 mg total) by mouth daily. 02/01/22   Adrian Prows, MD  B-D ULTRAFINE III SHORT PEN 31G X 8 MM MISC USE 1 PEN NEEDLE AS DIRECTED 11/11/21   Nida, Marella Chimes, MD  Blood Glucose Monitoring Suppl (FREESTYLE FREEDOM LITE) w/Device KIT 1 each by Does not apply route 4 (four) times daily. USE BLOOD GLUCOSE MONITOR TO CHECK BLOOD SUGAR. 06/18/18   [name], MD  cyanocobalamin (VITAMIN B12) 1000 MCG tablet Take 1 tablet (1,000 mcg total) by mouth daily. 01/29/22   Horton, Barbette Hair, MD  diltiazem (CARDIZEM CD) 240 MG 24 hr capsule TAKE 1 CAPSULE DAILY 02/01/22   Adrian Prows, MD  ELIQUIS 5 MG TABS tablet TAKE 1  TABLET TWICE A DAY Patient taking differently: Take 5 mg by mouth 2 (two) times daily. 05/10/21   Adrian Prows, MD  ezetimibe (ZETIA) 10 MG tablet TAKE 1 TABLET EVERY EVENING AFTER DINNER Patient taking differently: Take 10 mg by mouth daily. 02/12/20   Cassandria Anger, MD  GLIPIZIDE XL 5 MG 24 hr tablet TAKE 1 TABLET DAILY WITH BREAKFAST 02/27/22   Nida, Marella Chimes, MD  glucose blood (FREESTYLE LITE) test strip USE TO CHECK BLOOD SUGAR THREE TIMES A DAY AS INSTRUCTED 06/13/21   Cassandria Anger, MD  insulin glargine (LANTUS SOLOSTAR) 100 UNIT/ML Solostar Pen Inject 44 Units into the skin at bedtime. 01/26/22   Cassandria Anger, MD  Lancets (FREESTYLE) lancets Use as instructed to check blood sugar 4 times per day dx code 250.00 12/10/13   [name], MD  memantine (NAMENDA) 10 MG tablet Take 1 tablet (10 mg total) by mouth daily. 01/29/22   Horton, Barbette Hair, MD  metoprolol succinate (TOPROL-XL) 25 MG 24 hr tablet TAKE 1 TABLET DAILY WITH OR IMMEDIATELY FOLLOWING A MEAL Patient taking differently: Take 25 mg by mouth daily. 07/29/21   Adrian Prows, MD  SYNTHROID 25 MCG tablet TAKE 1 TABLET DAILY BEFORE BREAKFAST 02/14/22   Cassandria Anger, MD      Allergies    Beta adrenergic blockers, Other, and Tramadol    Review of Systems  Review of Systems  Genitourinary:  Positive for flank pain.  All other systems reviewed and are negative.   Physical Exam Updated Vital Signs BP (!) 146/82   Pulse 84   Temp 97.7 F (36.5 C) (Oral)   Resp 20   Ht _0  (1.778 m)   Wt 95.3 kg   SpO2 98%   BMI 30.13 kg/m  Physical Exam Vitals and nursing note reviewed.  Constitutional:      General: He is not in acute distress.    Appearance: He is not diaphoretic.  HENT:     Head: Normocephalic and atraumatic.     Mouth/Throat:     Pharynx: No oropharyngeal exudate.  Eyes:     General: No scleral icterus.    Conjunctiva/sclera: Conjunctivae normal.  Cardiovascular:     Rate  and Rhythm: Normal rate and regular rhythm.     Pulses: Normal pulses.     Heart sounds: Normal heart sounds.  Pulmonary:     Effort: Pulmonary effort is normal. No respiratory distress.     Breath sounds: Normal breath sounds. No wheezing.  Abdominal:     General: Bowel sounds are normal.     Palpations: Abdomen is soft. There is no mass.     Tenderness: There is no abdominal tenderness. There is no right CVA tenderness, left CVA tenderness, guarding or rebound.  Musculoskeletal:        General: Normal range of motion.     Cervical back: Normal range of motion and neck supple.  Skin:    General: Skin is warm and dry.  Neurological:     Mental Status: He is alert.  Psychiatric:        Behavior: Behavior normal.     ED Results / Procedures / Treatments   Labs (all labs ordered are listed, but only abnormal results are displayed) Labs Reviewed  URINALYSIS, ROUTINE W REFLEX MICROSCOPIC - Abnormal; Notable for the following components:      Result Value   Glucose, UA 50 (*)    Hgb urine dipstick SMALL (*)    Ketones, ur 5 (*)    Protein, ur 100 (*)    Bacteria, UA RARE (*)    All other components within normal limits  CBC WITH DIFFERENTIAL/PLATELET - Abnormal; Notable for the following components:   WBC 12.5 (*)    Neutro Abs 9.2 (*)    Monocytes Absolute 1.1 (*)    All other components within normal limits  COMPREHENSIVE METABOLIC PANEL - Abnormal; Notable for the following components:   Glucose, Bld 183 (*)    BUN 30 (*)    Creatinine, Ser 1.85 (*)    Total Protein 8.2 (*)    GFR, Estimated 36 (*)    All other components within normal limits  CBG MONITORING, ED - Abnormal; Notable for the following components:   Glucose-Capillary 197 (*)    All other components within normal limits  LIPASE, BLOOD    EKG None  Radiology CT ABDOMEN PELVIS WO CONTRAST  Result Date: 03/13/2022 CLINICAL DATA:  Left lower quadrant abdominal pain EXAM: CT ABDOMEN AND PELVIS WITHOUT  CONTRAST TECHNIQUE: Multidetector CT imaging of the abdomen and pelvis was performed following the standard protocol without IV contrast. RADIATION DOSE REDUCTION: This exam was performed according to the departmental dose-optimization program which includes automated exposure control, adjustment of the mA and/or kV according to patient size and/or use of iterative reconstruction technique. COMPARISON:  CT abdomen and pelvis 05/16/2019 FINDINGS: Lower chest:  No acute abnormality. Hepatobiliary: Stable rounded hypodensity in the inferior right lobe of the liver which is too small to characterize, likely a small cyst or hemangioma. The gallbladder and bile ducts are within normal limits. Pancreas: Unremarkable. No pancreatic ductal dilatation or surrounding inflammatory changes. Spleen: Normal in size without focal abnormality. Adrenals/Urinary Tract: There is a 5 mm calculus in the distal left ureter at the level of the ureterovesicular junction. There is mild/moderate left-sided hydronephrosis. There is an additional punctate nonobstructing left renal calculus. The right kidney, adrenal glands, and bladder are within normal limits. Stomach/Bowel: Stomach is within normal limits. Appendix is not seen. No evidence of bowel wall thickening, distention, or inflammatory changes. Vascular/Lymphatic: Aortic atherosclerosis. No enlarged abdominal or pelvic lymph nodes. Reproductive: Prostate radiotherapy seeds are present. Other: No abdominal wall hernia or abnormality. No abdominopelvic ascites. Musculoskeletal: L4-L5 posterior fusion hardware present. Mild compression deformity of the superior endplate of L3 is new from 2021, but appears chronic. IMPRESSION: 1. 5 mm calculus in the distal left ureter at the level of the ureterovesicular junction with mild/moderate left-sided hydronephrosis. 2. Additional punctate nonobstructing left renal calculus. 3. Mild compression deformity of the superior endplate of L3 is new from  2021, but appears chronic. Correlate clinically. Aortic Atherosclerosis (ICD10-I70.0). Electronically Signed   By: Ronney Asters M.D.   On: 03/13/2022 15:41    Procedures Procedures    Medications Ordered in ED Medications  sodium chloride 0.9 % bolus 500 mL (0 mLs Intravenous Stopped 03/13/22 1609)    ED Course/ Medical Decision Making/ A&P Clinical Course as of 03/14/22 1356  Mon Mar 13, 2022  1549 Consult with Urologist, Dr. Alyson Ingles who recommends follow up in the outpatient setting this week with prescriptions for flomax, antiemetic, and pain meds.  [SB]    Clinical Course User Index [SB] Chisom Muntean A, PA-C                           Medical Decision Making Amount and/or Complexity of Data Reviewed Labs: ordered. Radiology: ordered.  Risk Prescription drug management.   Pt presents with concerns for constipation onset 3 days.  Also concern for bilateral flank pain.  No meds tried prior to arrival.  Patient afebrile.  On exam patient without CVA tenderness to palpation noted bilaterally.  No cardiovascular, respiratory, abdominal exam acute findings. Differential diagnosis includes pyelonephritis, nephrolithiasis, acute cystitis, constipation.  Additional history obtained:  Additional history obtained from Spouse/Significant Other  Labs:  I ordered, and personally interpreted labs.  The pertinent results include:   Lipase unremarkable CBC with mild leukocytosis at 12.5 otherwise unremarkable CMP with AKI, creatinine elevated 1.85, BUN elevated at 30, GFR decreased at 36 otherwise unremarkable CBG slightly elevated 197 Urinalysis with a small amount of hemoglobin otherwise nitrate and leukocyte negative  Imaging: I ordered imaging studies including CT abdomen pelvis without I independently visualized and interpreted imaging which showed:  1. 5 mm calculus in the distal left ureter at the level of the  ureterovesicular junction with mild/moderate left-sided   hydronephrosis.  2. Additional punctate nonobstructing left renal calculus.  3. Mild compression deformity of the superior endplate of L3 is new  from 2021, but appears chronic. Correlate clinically.    Aortic Atherosclerosis (ICD10-I70.0).   I agree with the radiologist interpretation  Medications:  I ordered medication including IVF due to AKI I have reviewed the patients home medicines and have made adjustments as needed   Consultations: I requested  consultation with the Urologist, Dr. Alyson Ingles and discussed lab and imaging findings as well as pertinent plan - they recommend: Flomax, short course pain meds, nausea medication and follow-up in the office this week.   Disposition: Presentation suspicious for ureterolithiasis.  Doubt pyelonephritis, nephrolithiasis, acute cystitis at this time.  Doubt constipation, no indications for moderate amount of stool burden on CT scan today.  Discussed with patient and wife that we could perform a digital rectal exam to disimpact any stool, patient and wife declined at this time.  Offered to send patient home with a prescription for MiraLAX, patient and wife declined at this time. After consideration of the diagnostic results and the patients response to treatment, I feel that the patient would benefit from Discharge home.  Patient discharged home with a prescription for Flomax and Zofran.  PDMP reviewed, short course of Percocet sent.  Instructed with patient and his wife to have patient follow-up with urologist this week regarding today's ED visit.  Supportive care measures and strict return precautions discussed with patient at bedside. Pt acknowledges and verbalizes understanding. Pt appears safe for discharge. Follow up as indicated in discharge paperwork.    This chart was dictated using voice recognition software, Dragon. Despite the best efforts of this provider to proofread and correct errors, errors may still occur which can change  documentation meaning.    Final Clinical Impression(s) / ED Diagnoses Final diagnoses:  Ureterolithiasis    Rx / DC Orders ED Discharge Orders          Ordered    oxyCODONE-acetaminophen (PERCOCET/ROXICET) 5-325 MG tablet  Every 6 hours PRN        03/13/22 1644    tamsulosin (FLOMAX) 0.4 MG CAPS capsule  Daily        03/13/22 1644    ondansetron (ZOFRAN) 4 MG tablet  Every 6 hours PRN        03/13/22 1644              Jami Bogdanski A, PA-C 03/14/22 1401    Fransico Meadow, MD 03/16/22 1119

## 2022-03-15 LAB — BASIC METABOLIC PANEL
BUN: 30 — AB (ref 4–21)
CO2: 25 — AB (ref 13–22)
Chloride: 101 (ref 99–108)
Creatinine: 2.1 — AB (ref 0.6–1.3)
Glucose: 253
Potassium: 4.9 mEq/L (ref 3.5–5.1)
Sodium: 136 — AB (ref 137–147)

## 2022-03-15 LAB — TSH: TSH: 6.43 — AB (ref 0.41–5.90)

## 2022-03-15 LAB — HEPATIC FUNCTION PANEL
ALT: 10 U/L (ref 10–40)
AST: 11 — AB (ref 14–40)
Alkaline Phosphatase: 62 (ref 25–125)
Bilirubin, Total: 0.9

## 2022-03-15 LAB — COMPREHENSIVE METABOLIC PANEL
Albumin: 3.9 (ref 3.5–5.0)
Calcium: 9.3 (ref 8.7–10.7)
Globulin: 3.2

## 2022-03-15 LAB — LIPID PANEL
Cholesterol: 121 (ref 0–200)
LDL Cholesterol: 51
Triglycerides: 66 (ref 40–160)

## 2022-04-03 ENCOUNTER — Other Ambulatory Visit: Payer: Self-pay

## 2022-04-03 MED ORDER — EZETIMIBE 10 MG PO TABS: 10.0000 mg | ORAL_TABLET | Freq: Every day | ORAL | 0 refills | Status: DC

## 2022-04-04 ENCOUNTER — Ambulatory Visit (INDEPENDENT_AMBULATORY_CARE_PROVIDER_SITE_OTHER): Payer: Medicare Other | Admitting: Physician Assistant

## 2022-04-04 VITALS — BP 118/78 | HR 76

## 2022-04-04 DIAGNOSIS — N132 Hydronephrosis with renal and ureteral calculous obstruction: Secondary | ICD-10-CM

## 2022-04-04 DIAGNOSIS — Z8546 Personal history of malignant neoplasm of prostate: Secondary | ICD-10-CM

## 2022-04-04 DIAGNOSIS — N200 Calculus of kidney: Secondary | ICD-10-CM

## 2022-04-04 DIAGNOSIS — N1330 Unspecified hydronephrosis: Secondary | ICD-10-CM

## 2022-04-04 NOTE — Progress Notes (Unsigned)
04/04/2022 11:11 AM   Jonathan Hensley 1940-05-19 [id number]   Assessment:  There are no diagnoses linked to this encounter.   Plan: ***  No orders of the defined types were placed in this encounter.     Chief Complaint: ***  Referring provider: Coolidge Breeze, FNP [address] [place],  [place] [address]   History of Present Illness:  Jonathan Hensley is a 81 y.o. year old male with history of progressive primary aphasia, retention, hypothyroidism, diabetes mellitus who is seen in consultation from [facility]  for evaluation of left-sided ureteral stone with flank pain and hydronephrosis diagnosed on 03/13/2022 during ED evaluation for complaint of constipation and flank pain. CT without contrast on 03/13/2022 indicates 5 mm calculus at the UVJ on the left with small nonobstructing left renal calculus.  Moderate left-sided hydronephrosis appreciated.  Number of pads/day= Soda intake= UA= PVR=  Medical records including notes, lab results, and imaging studies reviewed during pt OV.  Past Medical History:  Past Medical History:  Diagnosis Date   Arthritis    rheumatoid in his hands   Atrial fibrillation (HCC)    Bulging lumbar disc    Cancer ([place])    DDD (degenerative disc disease), cervical    Diabetes mellitus    Diabetic retinopathy ([place])    Double vision    Dysrhythmia    Atrial Fibrillation   History of prostate cancer    Hypertension    Low back pain    Nephrolithiasis    Sleep apnea    uses cpap    Past Surgical History:  Past Surgical History:  Procedure Laterality Date   APPENDECTOMY     BACK SURGERY     Bilateral L4-5 lumbar decompression including bilateral  11/2008   brachytherapy     COLONOSCOPY     CYSTOSCOPY     Left L4-L5 lumbar laminotomy and microdiskectomy with  07/14/2008     LITHOTRIPSY     LUMBAR LAMINECTOMY/DECOMPRESSION MICRODISCECTOMY Left 08/18/2013   Procedure: Left Lumbar Five-Sacral One  Microdiskectomy;  Surgeon: [name], MD;  Location: [facility];  Service: Neurosurgery;  Laterality: Left;  Left Lumbar Five-Sacral One Microdiskectomy   rotator cuff surgery     Scleral buckle, laser photocoagulation, and anterior chamber  01/04/2009    Allergies:  Allergies  Allergen Reactions   Beta Adrenergic Blockers Other (See Comments)    Severe dizziness   Other Other (See Comments)    Beta or alpha blockers: severe dizziness   Tramadol Other (See Comments)    Dizziness     Family History:  Family History  Problem Relation Age of Onset   Heart failure Father    Heart failure Mother    Heart failure Brother        41 brother.   Colon cancer Neg Hx     Social History:  Social History   Tobacco Use   Smoking status: Former    Types: Cigarettes    Quit date: 02/12/1973    Years since quitting: 49.1   Smokeless tobacco: Never   Tobacco comments:    starting at age 89  Vaping Use   Vaping Use: Never used  Substance Use Topics   Alcohol use: No   Drug use: No    Review of symptoms:  Constitutional:  Negative for unexplained weight loss, night sweats, fever, chills ENT:  Negative for nose bleeds, sinus pain, painful swallowing CV:  Negative for  chest pain Resp:  Negative for cough, wheezing, shortness of breath GI:  Negative for nausea, vomiting, diarrhea, bloody stools GU:  Positives noted in HPI Neuro:  Negative for seizures Psych:  Negative for lack of energy, depression, anxiety Endocrine:  Negative for polydipsia, polyuria, symptoms of hypoglycemia (dizziness, hunger, sweating) Hematologic:  Negative for anemia, purpura, petechia, prolonged or excessive bleeding, use of anticoagulants   Physical Exam: There were no vitals taken for this visit.  Constitutional:  Alert and oriented, No acute distress. HEENT: NCAT, moist mucus membranes.  Trachea midline, no masses. Cardiovascular: Regular rate and rhythm without murmur, rub, or  gallops Respiratory: Normal respiratory effort, clear to auscultation bilaterally GI: Abdomen is soft, nontender, nondistended, no abdominal masses BACK:  Non-tender to palpation.  No CVAT Lymph: No cervical or inguinal lymphadenopathy. Skin: No obvious rashes, warm, dry, intact Neurologic: Alert and cooperative Psychiatric: Appropriate. Normal mood and affect.  Laboratory Data: No results found for this or any previous visit (from the past 24 hour(s)).  Lab Results  Component Value Date   WBC 12.5 (H) 03/13/2022   HGB 13.4 03/13/2022   HCT 39.6 03/13/2022   MCV 93.4 03/13/2022   PLT 269 03/13/2022    Lab Results  Component Value Date   CREATININE 2.1 (A) 03/15/2022    Lab Results  Component Value Date   PSA 0.00 Repeated and verified X2. (L) 09/20/2017    No results found for: "TESTOSTERONE"  Lab Results  Component Value Date   HGBA1C 8.5 (A) 01/26/2022    Urinalysis    Component Value Date/Time   COLORURINE YELLOW 03/13/2022 [place] 03/13/2022 1219   LABSPEC 1.023 03/13/2022 1219   PHURINE 5.0 03/13/2022 1219   GLUCOSEU 50 (A) 03/13/2022 1219   GLUCOSEU NEGATIVE 10/19/2015 1109   HGBUR SMALL (A) 03/13/2022 1219   BILIRUBINUR NEGATIVE 03/13/2022 1219   KETONESUR 5 (A) 03/13/2022 1219   PROTEINUR 100 (A) 03/13/2022 1219   UROBILINOGEN 0.2 10/19/2015 1109   NITRITE NEGATIVE 03/13/2022 1219   LEUKOCYTESUR NEGATIVE 03/13/2022 1219    Lab Results  Component Value Date   BACTERIA RARE (A) 03/13/2022    Pertinent Imaging: Results for orders placed during the hospital encounter of 08/03/05  DG Abd 1 View  Narrative Clinical data: Left renal calculus. ABDOMEN - 1 VIEW: Findings: Two views of the abdomen are made without previous films for comparison and show a 7 x 9 mm calcification in the region of the lower pole calyx of the left kidney. No ureteral calculi are seen. The bones of the lumbar spine and pelvis appear to be within the  normal limit. There are some degenerative hypertrophic spurs of the lower thoracic spine.  Impression 7 x 9 mm calcification lower pole calyx left kidney.  Provider: Briscoe Burns  No results found for this or any previous visit.     [place], Berneice Heinrich, PA-C [facility] [place]

## 2022-04-05 ENCOUNTER — Encounter: Payer: Self-pay | Admitting: Physician Assistant

## 2022-04-05 LAB — URINALYSIS, ROUTINE W REFLEX MICROSCOPIC
Bilirubin, UA: NEGATIVE
Glucose, UA: NEGATIVE
Ketones, UA: NEGATIVE
Leukocytes,UA: NEGATIVE
Nitrite, UA: NEGATIVE
Protein,UA: NEGATIVE
Specific Gravity, UA: 1.02 (ref 1.005–1.030)
Urobilinogen, Ur: 0.2 mg/dL (ref 0.2–1.0)
pH, UA: 5 (ref 5.0–7.5)

## 2022-04-05 LAB — MICROSCOPIC EXAMINATION: Bacteria, UA: NONE SEEN

## 2022-05-08 ENCOUNTER — Other Ambulatory Visit: Payer: Self-pay | Admitting: "Endocrinology

## 2022-05-08 DIAGNOSIS — E1165 Type 2 diabetes mellitus with hyperglycemia: Secondary | ICD-10-CM

## 2022-05-15 ENCOUNTER — Other Ambulatory Visit: Payer: Self-pay | Admitting: "Endocrinology

## 2022-05-18 ENCOUNTER — Ambulatory Visit (HOSPITAL_COMMUNITY)
Admission: RE | Admit: 2022-05-18 | Discharge: 2022-05-18 | Disposition: A | Discharge status: Home / Self Care | Payer: Medicare Other | Source: Ambulatory Visit | Attending: Physician Assistant | Admitting: Physician Assistant

## 2022-05-18 DIAGNOSIS — N200 Calculus of kidney: Secondary | ICD-10-CM | POA: Insufficient documentation

## 2022-05-23 ENCOUNTER — Ambulatory Visit: Payer: Medicare Other | Admitting: Urology

## 2022-05-29 ENCOUNTER — Other Ambulatory Visit: Payer: Self-pay | Admitting: "Endocrinology

## 2022-05-29 ENCOUNTER — Other Ambulatory Visit: Payer: Self-pay | Admitting: *Deleted

## 2022-05-29 DIAGNOSIS — E1165 Type 2 diabetes mellitus with hyperglycemia: Secondary | ICD-10-CM

## 2022-05-29 MED ORDER — LANTUS SOLOSTAR 100 UNIT/ML ~~LOC~~ SOPN: 44.0000 [IU] | PEN_INJECTOR | Freq: Every day | SUBCUTANEOUS | 0 refills | Status: DC

## 2022-06-12 ENCOUNTER — Other Ambulatory Visit: Payer: Self-pay | Admitting: Cardiology

## 2022-06-12 NOTE — Progress Notes (Signed)
History of Present Illness: First visit for this man to see me in years.  He has a history of stone disease with him passing a left ureteral stone in October of last year.  He comes in today for follow-up with a recent renal ultrasound showing no hydronephrosis with a small residual left lower pole stone.  His history with me dates back years ago.  Diagnosed with high risk prostate cancer.  He completed combination radiotherapy as well as ADT.  He has not been checked with regards to his prostate cancer for at least 4-1/2 years, but his PSA in May 2019 was undetectable.  He is having no flank pain.  He denies gross hematuria. Past Medical History:  Diagnosis Date   Arthritis    rheumatoid in his hands   Atrial fibrillation (HCC)    Bulging lumbar disc    Cancer ([place])    DDD (degenerative disc disease), cervical    Diabetes mellitus    Diabetic retinopathy ([place])    Double vision    Dysrhythmia    Atrial Fibrillation   History of prostate cancer    Hypertension    Low back pain    Nephrolithiasis    Sleep apnea    uses cpap    Past Surgical History:  Procedure Laterality Date   APPENDECTOMY     BACK SURGERY     Bilateral L4-5 lumbar decompression including bilateral  11/2008   brachytherapy     COLONOSCOPY     CYSTOSCOPY     Left L4-L5 lumbar laminotomy and microdiskectomy with  07/14/2008     LITHOTRIPSY     LUMBAR LAMINECTOMY/DECOMPRESSION MICRODISCECTOMY Left 08/18/2013   Procedure: Left Lumbar Five-Sacral One Microdiskectomy;  Surgeon: [name], MD;  Location: [facility];  Service: Neurosurgery;  Laterality: Left;  Left Lumbar Five-Sacral One Microdiskectomy   rotator cuff surgery     Scleral buckle, laser photocoagulation, and anterior chamber  01/04/2009    Home Medications:  Allergies as of 06/13/2022       Reactions   Beta Adrenergic Blockers Other (See Comments)   Severe dizziness   Other Other (See Comments)   Beta or alpha blockers: severe dizziness    Tramadol Other (See Comments)   Dizziness         Medication List        Accurate as of June 12, 2022  7:30 PM. If you have any questions, ask your nurse or doctor.          atorvastatin 10 MG tablet Commonly known as: LIPITOR Take 1 tablet (10 mg total) by mouth daily.   B-D ULTRAFINE III SHORT PEN 31G X 8 MM Misc Generic drug: Insulin Pen Needle USE 1 PEN NEEDLE AS DIRECTED   cyanocobalamin 1000 MCG tablet Commonly known as: VITAMIN B12 Take 1 tablet (1,000 mcg total) by mouth daily.   diltiazem 240 MG 24 hr capsule Commonly known as: CARDIZEM CD TAKE 1 CAPSULE DAILY   Eliquis 5 MG Tabs tablet Generic drug: apixaban TAKE 1 TABLET TWICE A DAY   ezetimibe 10 MG tablet Commonly known as: ZETIA Take 1 tablet (10 mg total) by mouth daily.   FreeStyle Freedom Lite w/Device Kit 1 each by Does not apply route 4 (four) times daily. USE BLOOD GLUCOSE MONITOR TO CHECK BLOOD SUGAR.   freestyle lancets Use as instructed to check blood sugar 4 times per day dx code 250.00   FREESTYLE LITE test strip Generic drug: glucose blood USE TO  CHECK BLOOD SUGAR THREE TIMES A DAY AS INSTRUCTED   glipiZIDE XL 5 MG 24 hr tablet Generic drug: glipiZIDE TAKE 1 TABLET DAILY WITH BREAKFAST   Lantus SoloStar 100 UNIT/ML Solostar Pen Generic drug: insulin glargine Inject 44 Units into the skin at bedtime.   memantine 10 MG tablet Commonly known as: Namenda Take 1 tablet (10 mg total) by mouth daily.   metoprolol succinate 25 MG 24 hr tablet Commonly known as: TOPROL-XL TAKE 1 TABLET DAILY WITH OR IMMEDIATELY FOLLOWING A MEAL What changed: See the new instructions.   ondansetron 4 MG tablet Commonly known as: ZOFRAN Take 1 tablet (4 mg total) by mouth every 6 (six) hours as needed for nausea or vomiting.   oxyCODONE-acetaminophen 5-325 MG tablet Commonly known as: PERCOCET/ROXICET Take 1 tablet by mouth every 6 (six) hours as needed for severe pain.   Synthroid 25 MCG  tablet Generic drug: levothyroxine TAKE 1 TABLET DAILY BEFORE BREAKFAST   tamsulosin 0.4 MG Caps capsule Commonly known as: FLOMAX Take 1 capsule (0.4 mg total) by mouth daily.        Allergies:  Allergies  Allergen Reactions   Beta Adrenergic Blockers Other (See Comments)    Severe dizziness   Other Other (See Comments)    Beta or alpha blockers: severe dizziness   Tramadol Other (See Comments)    Dizziness     Family History  Problem Relation Age of Onset   Heart failure Father    Heart failure Mother    Heart failure Brother        34 brother.   Colon cancer Neg Hx     Social History:  reports that he quit smoking about 49 years ago. His smoking use included cigarettes. He has never used smokeless tobacco. He reports that he does not drink alcohol and does not use drugs.  ROS: A complete review of systems was performed.  All systems are negative except for pertinent findings as noted.  Physical Exam:  Vital signs in last 24 hours: There were no vitals taken for this visit. Constitutional:  Alert and oriented, No acute distress Cardiovascular: Regular rate  Respiratory: Normal respiratory effort Neurologic: Grossly intact, no focal deficits.  He does have expressive aphasia Psychiatric: Normal mood and affect  I have reviewed prior pt notes   I have reviewed urinalysis results     Impression/Assessment:  1.  History of left ureteral stone, passed, no residual hydronephrosis.  Small remaining left lower pole stone, does not need treatment  2.  History of high risk prostate cancer, status post combination radiotherapy and ADT years ago without adequate PSA follow-up  Plan:  1.  Reassurance regarding stone-no further management needed at that residual stone  2.  We will have his PSA checked today  3.  I strongly recommend annual PSA checks in this man.  That is fine to be done by his PCP.

## 2022-06-13 ENCOUNTER — Encounter: Payer: Self-pay | Admitting: Urology

## 2022-06-13 ENCOUNTER — Ambulatory Visit (INDEPENDENT_AMBULATORY_CARE_PROVIDER_SITE_OTHER): Payer: Medicare Other | Admitting: Urology

## 2022-06-13 VITALS — BP 152/85 | HR 89

## 2022-06-13 DIAGNOSIS — Z8546 Personal history of malignant neoplasm of prostate: Secondary | ICD-10-CM

## 2022-06-13 DIAGNOSIS — N200 Calculus of kidney: Secondary | ICD-10-CM | POA: Diagnosis not present

## 2022-06-13 DIAGNOSIS — N132 Hydronephrosis with renal and ureteral calculous obstruction: Secondary | ICD-10-CM

## 2022-06-14 LAB — URINALYSIS, ROUTINE W REFLEX MICROSCOPIC
Bilirubin, UA: NEGATIVE
Glucose, UA: NEGATIVE
Ketones, UA: NEGATIVE
Leukocytes,UA: NEGATIVE
Nitrite, UA: NEGATIVE
Specific Gravity, UA: 1.025 (ref 1.005–1.030)
Urobilinogen, Ur: 0.2 mg/dL (ref 0.2–1.0)
pH, UA: 5.5 (ref 5.0–7.5)

## 2022-06-14 LAB — PSA: Prostate Specific Ag, Serum: <0.1 ng/mL (ref 0.0–4.0)

## 2022-06-14 LAB — MICROSCOPIC EXAMINATION
Bacteria, UA: NONE SEEN
WBC, UA: NONE SEEN /hpf (ref 0–5)

## 2022-06-16 ENCOUNTER — Telehealth: Payer: Self-pay

## 2022-06-16 NOTE — Telephone Encounter (Signed)
-----   Message from Franchot Gallo, MD sent at 06/15/2022  5:52 PM EST ----- Notify pt--PSA still 0 ----- Message ----- From: Sherrilyn Rist, CMA Sent: 06/14/2022  10:26 AM EST To: Franchot Gallo, MD  Please review

## 2022-06-16 NOTE — Telephone Encounter (Signed)
Patient/wife is aware that PSA still 0. Wife/Patient voiced understanding

## 2022-06-29 ENCOUNTER — Ambulatory Visit: Payer: TRICARE For Life (TFL) | Admitting: Cardiology

## 2022-06-29 ENCOUNTER — Encounter: Payer: Self-pay | Admitting: Cardiology

## 2022-06-29 VITALS — BP 138/80 | HR 71 | Resp 17 | Ht 70.0 in | Wt 216.4 lb

## 2022-06-29 DIAGNOSIS — I4821 Permanent atrial fibrillation: Secondary | ICD-10-CM

## 2022-06-29 DIAGNOSIS — I428 Other cardiomyopathies: Secondary | ICD-10-CM

## 2022-06-29 DIAGNOSIS — I10 Essential (primary) hypertension: Secondary | ICD-10-CM

## 2022-06-29 NOTE — Progress Notes (Signed)
Primary Physician:  Coolidge Breeze, FNP   Patient ID: Jonathan Hensley, male    DOB: [birth year], 82 y.o.   MRN: [id number]  Subjective:    Chief Complaint  Patient presents with   Non-ischemic cardiomyopathy   Atrial Fibrillation   Follow-up    1 year    HPI: Jonathan Hensley  is a 82 y.o. male  with history of diabetes mellitus uncontrolled, hypertension and hyperlipidemia, nonischemic cardiomyopathy with mildly reduced LVEF however EF is normalized by medical therapy as of echocardiogram in 2022, permanent atrial fibrillation. He has double vision and prism colors due to h/o retinal detachment (chronic) but has also developed diabetic retinopathy left eye, chronic vertigo. He has sleep apnea and uses CPAP on a regular basis.  He has developed expressive aphasia and also mild cognitive impairment, but these things have settled down and is being followed closely by neurology.  Patient reports he is doing relatively well overall without specific complaints today.  Denies chest pain, palpitations, dyspnea, dizziness, syncope, near syncope.  Denies fatigue, orthopnea, leg swelling.  Past Medical History:  Diagnosis Date   Arthritis    rheumatoid in his hands   Atrial fibrillation (HCC)    Bulging lumbar disc    Cancer ([place])    DDD (degenerative disc disease), cervical    Diabetes mellitus    Diabetic retinopathy ([place])    Double vision    Dysrhythmia    Atrial Fibrillation   History of prostate cancer    Hypertension    Low back pain    Nephrolithiasis    Sleep apnea    uses cpap    Past Surgical History:  Procedure Laterality Date   APPENDECTOMY     BACK SURGERY     Bilateral L4-5 lumbar decompression including bilateral  11/2008   brachytherapy     COLONOSCOPY     CYSTOSCOPY     Left L4-L5 lumbar laminotomy and microdiskectomy with  07/14/2008     LITHOTRIPSY     LUMBAR LAMINECTOMY/DECOMPRESSION MICRODISCECTOMY Left 08/18/2013   Procedure: Left Lumbar Five-Sacral One  Microdiskectomy;  Surgeon: [name], MD;  Location: [facility];  Service: Neurosurgery;  Laterality: Left;  Left Lumbar Five-Sacral One Microdiskectomy   rotator cuff surgery     Scleral buckle, laser photocoagulation, and anterior chamber  01/04/2009   Family History  Problem Relation Age of Onset   Heart failure Father    Heart failure Mother    Heart failure Brother        20 brother.   Colon cancer Neg Hx     Social History   Tobacco Use   Smoking status: Former    Types: Cigarettes    Quit date: 02/12/1973    Years since quitting: 49.4   Smokeless tobacco: Never   Tobacco comments:    starting at age 60  Substance Use Topics   Alcohol use: No   Marital Status: Married   ROS   Review of Systems  Cardiovascular:  Negative for chest pain, dyspnea on exertion and leg swelling.   Objective:  Blood pressure 138/80, pulse 71, resp. rate 17, height 5' 10"$  (1.778 m), weight 216 lb 6.4 oz (98.2 kg), SpO2 97 %. Body mass index is 31.05 kg/m.     06/29/2022    1:03 PM 06/13/2022    1:20 PM 04/04/2022   11:50 AM  Vitals with BMI  Height 5' 10"$     Weight 216 lbs 6 oz  BMI [id number]    Systolic 0000000 [id number]  Diastolic 80 85 78  Pulse 71 89 76    Physical Exam Neck:     Vascular: No carotid bruit or JVD.  Cardiovascular:     Rate and Rhythm: Normal rate. Rhythm irregularly irregular.     Pulses: Intact distal pulses.     Heart sounds: Normal heart sounds. No murmur heard.    No gallop.  Pulmonary:     Effort: Pulmonary effort is normal.     Breath sounds: Normal breath sounds.  Abdominal:     General: Bowel sounds are normal.     Palpations: Abdomen is soft.  Musculoskeletal:     Right lower leg: No edema.     Left lower leg: No edema.    Lab Results  Component Value Date   NA 136 (A) 03/15/2022   K 4.9 03/15/2022   CO2 25 (A) 03/15/2022   GLUCOSE 183 (H) 03/13/2022   BUN 30 (A) 03/15/2022   CREATININE 2.1 (A) 03/15/2022   CALCIUM 9.3 03/15/2022    EGFR 56 (L) 01/19/2022   GFRNONAA 36 (L) 03/13/2022        Latest Ref Rng & Units 03/15/2022   12:00 AM 03/13/2022    1:36 PM 01/28/2022    5:31 PM  CMP  Glucose 70 - 99 mg/dL  183  326   BUN 4 - 21 30     30  26   $ Creatinine 0.6 - 1.3 2.1     1.85  1.31   Sodium 137 - 147 136     136  138   Potassium 3.5 - 5.1 mEq/L 4.9     4.4  4.2   Chloride 99 - 108 101     101  104   CO2 13 - 22 25     25  21   $ Calcium 8.7 - 10.7 9.3     9.6  9.4   Total Protein 6.5 - 8.1 g/dL  8.2  7.0   Total Bilirubin 0.3 - 1.2 mg/dL  1.0  0.7   Alkaline Phos 25 - 125 62     65  68   AST 14 - 40 11     16  19   $ ALT 10 - 40 U/L 10     16  16      $ This result is from an external source.      Latest Ref Rng & Units 03/13/2022    1:36 PM 01/28/2022    5:31 PM 05/16/2019    8:19 AM  CBC  WBC 4.0 - 10.5 K/uL 12.5  8.4  3.9   Hemoglobin 13.0 - 17.0 g/dL 13.4  11.9  12.9   Hematocrit 39.0 - 52.0 % 39.6  36.0  38.9   Platelets 150 - 400 K/uL 269  256  208    Lipid Panel Recent Labs    01/19/22 0845 03/14/22 0000 03/15/22 0000  CHOL 130  --  121  TRIG 64  --  66  LDLCALC 66  --  51  HDL 51 55  --   CHOLHDL 2.5  --   --     HEMOGLOBIN A1C Lab Results  Component Value Date   HGBA1C 8.5 (A) 01/26/2022   MPG 206 01/14/2019   TSH Recent Labs    01/19/22 0845 01/28/22 1735 03/15/22 0000  TSH 3.500 2.698 6.43*   Allergies   Allergies  Allergen Reactions   Beta Adrenergic Blockers  Other (See Comments)    Severe dizziness   Other Other (See Comments)    Beta or alpha blockers: severe dizziness   Tramadol Other (See Comments)    Dizziness     Final Medications at End of Visit    Current Outpatient Medications:    atorvastatin (LIPITOR) 10 MG tablet, Take 1 tablet (10 mg total) by mouth daily., Disp: 90 tablet, Rfl: 1   B-D ULTRAFINE III SHORT PEN 31G X 8 MM MISC, USE 1 PEN NEEDLE AS DIRECTED, Disp: 100 each, Rfl: 3   Blood Glucose Monitoring Suppl (FREESTYLE FREEDOM LITE) w/Device KIT,  1 each by Does not apply route 4 (four) times daily. USE BLOOD GLUCOSE MONITOR TO CHECK BLOOD SUGAR., Disp: 1 each, Rfl: 0   cyanocobalamin (VITAMIN B12) 1000 MCG tablet, Take 1 tablet (1,000 mcg total) by mouth daily., Disp: 30 tablet, Rfl: 0   diltiazem (CARDIZEM CD) 240 MG 24 hr capsule, TAKE 1 CAPSULE DAILY, Disp: 90 capsule, Rfl: 3   ELIQUIS 5 MG TABS tablet, TAKE 1 TABLET TWICE A DAY, Disp: 180 tablet, Rfl: 3   ezetimibe (ZETIA) 10 MG tablet, Take 1 tablet (10 mg total) by mouth daily., Disp: 90 tablet, Rfl: 0   GLIPIZIDE XL 5 MG 24 hr tablet, TAKE 1 TABLET DAILY WITH BREAKFAST, Disp: 90 tablet, Rfl: 3   glucose blood (FREESTYLE LITE) test strip, USE TO CHECK BLOOD SUGAR THREE TIMES A DAY AS INSTRUCTED, Disp: 300 each, Rfl: 1   insulin glargine (LANTUS SOLOSTAR) 100 UNIT/ML Solostar Pen, Inject 44 Units into the skin at bedtime., Disp: 45 mL, Rfl: 0   Lancets (FREESTYLE) lancets, Use as instructed to check blood sugar 4 times per day dx code 250.00, Disp: 400 each, Rfl: 3   memantine (NAMENDA) 10 MG tablet, Take 1 tablet (10 mg total) by mouth daily., Disp: 30 tablet, Rfl: 0   metoprolol succinate (TOPROL-XL) 25 MG 24 hr tablet, TAKE 1 TABLET DAILY WITH OR IMMEDIATELY FOLLOWING A MEAL (Patient taking differently: Take 25 mg by mouth daily.), Disp: 90 tablet, Rfl: 3   ondansetron (ZOFRAN) 4 MG tablet, Take 1 tablet (4 mg total) by mouth every 6 (six) hours as needed for nausea or vomiting., Disp: 12 tablet, Rfl: 0   oxyCODONE-acetaminophen (PERCOCET/ROXICET) 5-325 MG tablet, Take 1 tablet by mouth every 6 (six) hours as needed for severe pain., Disp: 9 tablet, Rfl: 0   SYNTHROID 25 MCG tablet, TAKE 1 TABLET DAILY BEFORE BREAKFAST, Disp: 90 tablet, Rfl: 3  Radiology   No results found.  Cardiac Studies:   Holter Monitor 12/26/11: Brief A. fibrillation with RVR . NO symptoms reported. Paroxysmal epidoses. ED visit 06/14/2016: A. Fib with RVR.  Lexiscan Tetrofosmin Stress Test   02/09/2020: Nondiagnostic ECG stress. Resting EKG demonstrated normal sinus rhythm, occasional premature atrial contractions. Peak EKG, no ST-T wave abnormalities. During infusion the peak ECG revealed brief atrial tachycardia and premature atrial contractions. There is a fixed mild defect in inferior region consistent with diaphragmatic attenuation. Superimposed on this, there is a partially reversible small sized mild defect in the inferior region.  Overall LV systolic function is normal without regional wall motion abnormalities. Stress LV EF: 58%.  Compared to 02/23/12, inferior defect is new. Low risk  Echocardiogram 10/18/2020:  Normal LV systolic function with visual EF 55-60%. Left ventricle cavity  is normal in size. Normal global wall motion. Normal diastolic filling  pattern, normal LAP.  Left atrial cavity is mildly dilated.  Mild (Grade I) mitral  regurgitation.  Mild tricuspid regurgitation. No evidence of pulmonary hypertension. RVSP  measures 34 mmHg.  Compared to study dated 01/26/2020: LVEF improved from 45-50% to 55-60%,  LAE was severely dilated and now mild LAE.    EKG:   EKG 06/29/2022: Atrial fibrillation with controlled ventricular response at the rate of 80 bpm, normal axis.  Incomplete right bundle branch block.  No evidence of ischemia.  Single PVC.  Compared to 06/29/2021, no significant change.  Assessment:     ICD-10-CM   1. Permanent atrial fibrillation (HCC)  I48.21 EKG 12-Lead    2. Non-ischemic cardiomyopathy ([place])  I42.8     3. Essential hypertension  I10      Medications Discontinued During This Encounter  Medication Reason   tamsulosin (FLOMAX) 0.4 MG CAPS capsule No longer needed (for PRN medications)     No orders of the defined types were placed in this encounter.    CHA2DS2-VASc Score is 4.  Yearly risk of stroke: 4.8% (A, HTN, DM).  Score of 1=0.6; 2=2.2; 3=3.2; 4=4.8; 5=7.2; 6=9.8; 7=>9.8) -(CHF; HTN; vasc disease DM,  Male = 1; Age <65  =0; 65-74 = 1,  >75 =2; stroke/embolism= 2).    Recommendations:   Jonathan Hensley  is a 82 y.o. with history of diabetes mellitus uncontrolled, hypertension and hyperlipidemia, nonischemic cardiomyopathy with mildly reduced LVEF however EF is normalized by medical therapy as of echocardiogram in 2022, permanent atrial fibrillation. He has double vision and prism colors due to h/o retinal detachment (chronic) but has also developed diabetic retinopathy left eye, chronic vertigo. He has sleep apnea and uses CPAP on a regular basis.  He has developed expressive aphasia and also mild cognitive impairment, but these things have settled down and is being followed closely by neurology.   1. Permanent atrial fibrillation ([place]) Patient is not rate controlled atrial fibrillation, is also tolerating anticoagulation without bleeding diathesis, labs including renal function and CBC are stable.  In view of high chads vascular score, continue the same.  2. Non-ischemic cardiomyopathy ([place]) He has nonischemic cardiomyopathy, suspect this was probably related to A-fib with RVR, LVEF has improved and normalized now by echocardiogram in 2022.  Has remained asymptomatic and no clinical evidence of heart failure, no recent hospitalization, I do not think he needs repeat echocardiogram.  3. Essential hypertension Blood pressure excellent control.  I did not make any changes to his medication.  I reviewed his external labs.  Reconciled his medications.  Patient plans to move to [place] to be closer to his family, hence may not see me again but I have set him up for a 1 year visit.   Adrian Prows, MD, [facility] 06/29/2022, 1:33 PM Office: [phone] Fax: [phone] Pager: [phone]

## 2022-07-03 ENCOUNTER — Other Ambulatory Visit: Payer: Self-pay | Admitting: "Endocrinology

## 2022-07-18 ENCOUNTER — Other Ambulatory Visit: Payer: Self-pay

## 2022-07-18 MED ORDER — LANCETS THIN MISC: 1.0000 | Freq: Three times a day (TID) | 1 refills | Status: DC

## 2022-07-18 MED ORDER — FREESTYLE LITE TEST VI STRP: ORAL_STRIP | 1 refills | Status: DC

## 2022-07-21 LAB — HEMOGLOBIN A1C: Hemoglobin A1C: 9.1

## 2022-07-24 ENCOUNTER — Other Ambulatory Visit: Payer: Self-pay

## 2022-07-24 ENCOUNTER — Other Ambulatory Visit: Payer: Self-pay | Admitting: Cardiology

## 2022-07-24 DIAGNOSIS — E1165 Type 2 diabetes mellitus with hyperglycemia: Secondary | ICD-10-CM

## 2022-07-24 DIAGNOSIS — I4819 Other persistent atrial fibrillation: Secondary | ICD-10-CM

## 2022-07-24 MED ORDER — ACCU-CHEK FASTCLIX LANCETS MISC: 2 refills | Status: DC

## 2022-07-27 ENCOUNTER — Encounter: Payer: Self-pay | Admitting: "Endocrinology

## 2022-07-27 ENCOUNTER — Ambulatory Visit (INDEPENDENT_AMBULATORY_CARE_PROVIDER_SITE_OTHER): Payer: Medicare Other | Admitting: "Endocrinology

## 2022-07-27 VITALS — BP 128/70 | HR 68 | Ht 70.0 in | Wt 213.0 lb

## 2022-07-27 DIAGNOSIS — E039 Hypothyroidism, unspecified: Secondary | ICD-10-CM | POA: Diagnosis not present

## 2022-07-27 DIAGNOSIS — I10 Essential (primary) hypertension: Secondary | ICD-10-CM

## 2022-07-27 DIAGNOSIS — E782 Mixed hyperlipidemia: Secondary | ICD-10-CM

## 2022-07-27 DIAGNOSIS — E1165 Type 2 diabetes mellitus with hyperglycemia: Secondary | ICD-10-CM | POA: Diagnosis not present

## 2022-07-27 MED ORDER — LANTUS SOLOSTAR 100 UNIT/ML ~~LOC~~ SOPN: 50.0000 [IU] | PEN_INJECTOR | Freq: Every day | SUBCUTANEOUS | 1 refills | Status: DC

## 2022-07-27 NOTE — Patient Instructions (Signed)
                                     Advice for Weight Management  -For most of us the best way to lose weight is by diet management. Generally speaking, diet management means consuming less calories intentionally which over time brings about progressive weight loss.  This can be achieved more effectively by avoiding ultra processed carbohydrates, processed meats, unhealthy fats.    It is critically important to know your numbers: how much calorie you are consuming and how much calorie you need. More importantly, our carbohydrates sources should be unprocessed naturally occurring  complex starch food items.  It is always important to balance nutrition also by  appropriate intake of proteins (mainly plant-based), healthy fats/oils, plenty of fruits and vegetables.   -The American College of Lifestyle Medicine (ACL M) recommends nutrition derived mostly from Whole Food, Plant Predominant Sources example an apple instead of applesauce or apple pie. Eat Plenty of vegetables, Mushrooms, fruits, Legumes, Whole Grains, Nuts, seeds in lieu of processed meats, processed snacks/pastries red meat, poultry, eggs.  Use only water or unsweetened tea for hydration.  The College also recommends the need to stay away from risky substances including alcohol, smoking; obtaining 7-9 hours of restorative sleep, at least 150 minutes of moderate intensity exercise weekly, importance of healthy social connections, and being mindful of stress and seek help when it is overwhelming.    -Sticking to a routine mealtime to eat 3 meals a day and avoiding unnecessary snacks is shown to have a big role in weight control. Under normal circumstances, the only time we burn stored energy is when we are hungry, so allow  some hunger to take place- hunger means no food between appropriate meal times, only water.  It is not advisable to starve.   -It is better to avoid simple carbohydrates including:  Cakes, Sweet Desserts, Ice Cream, Soda (diet and regular), Sweet Tea, Candies, Chips, Cookies, Store Bought Juices, Alcohol in Excess of  1-2 drinks a day, Lemonade,  Artificial Sweeteners, Doughnuts, Coffee Creamers, "Sugar-free" Products, etc, etc.  This is not a complete list.....    -Consulting with certified diabetes educators is proven to provide you with the most accurate and current information on diet.  Also, you may be  interested in discussing diet options/exchanges , we can schedule a visit with Jonathan Hensley, RDN, CDE for individualized nutrition education.  -Exercise: If you are able: 30 -60 minutes a day ,4 days a week, or 150 minutes of moderate intensity exercise weekly.    The longer the better if tolerated.  Combine stretch, strength, and aerobic activities.  If you were told in the past that you have high risk for cardiovascular diseases, or if you are currently symptomatic, you may seek evaluation by your heart doctor prior to initiating moderate to intense exercise programs.                                  Additional Care Considerations for Diabetes/Prediabetes   -Diabetes  is a chronic disease.  The most important care consideration is regular follow-up with your diabetes care provider with the goal being avoiding or delaying its complications and to take advantage of advances in medications and technology.  If appropriate actions are taken early enough, type 2 diabetes can even be   reversed.  Seek information from the right source.  - Whole Food, Plant Predominant Nutrition is highly recommended: Eat Plenty of vegetables, Mushrooms, fruits, Legumes, Whole Grains, Nuts, seeds in lieu of processed meats, processed snacks/pastries red meat, poultry, eggs as recommended by American College of  Lifestyle Medicine (ACLM).  -Type 2 diabetes is known to coexist with other important comorbidities such as high blood pressure and high cholesterol.  It is critical to control not only the  diabetes but also the high blood pressure and high cholesterol to minimize and delay the risk of complications including coronary artery disease, stroke, amputations, blindness, etc.  The good news is that this diet recommendation for type 2 diabetes is also very helpful for managing high cholesterol and high blood blood pressure.  - Studies showed that people with diabetes will benefit from a class of medications known as ACE inhibitors and statins.  Unless there are specific reasons not to be on these medications, the standard of care is to consider getting one from these groups of medications at an optimal doses.  These medications are generally considered safe and proven to help protect the heart and the kidneys.    - People with diabetes are encouraged to initiate and maintain regular follow-up with eye doctors, foot doctors, dentists , and if necessary heart and kidney doctors.     - It is highly recommended that people with diabetes quit smoking or stay away from smoking, and get yearly  flu vaccine and pneumonia vaccine at least every 5 years.  See above for additional recommendations on exercise, sleep, stress management , and healthy social connections.      

## 2022-07-27 NOTE — Progress Notes (Signed)
07/27/2022, 1:20 PM  Endocrinology follow-up note   Subjective:    Patient ID: Jonathan Hensley, male    DOB: 1940/09/07.  Jonathan Hensley is being seen in follow-up for the management of currently uncontrolled type 2 diabetes, hypothyroidism, hypertension, hyperlipidemia.   Patient was previously seen by [name] for endocrinology care.  Past Medical History:  Diagnosis Date   Arthritis    rheumatoid in his hands   Atrial fibrillation (HCC)    Bulging lumbar disc    Cancer ([place])    DDD (degenerative disc disease), cervical    Diabetes mellitus    Diabetic retinopathy ([place])    Double vision    Dysrhythmia    Atrial Fibrillation   History of prostate cancer    Hypertension    Low back pain    Nephrolithiasis    Sleep apnea    uses cpap    Past Surgical History:  Procedure Laterality Date   APPENDECTOMY     BACK SURGERY     Bilateral L4-5 lumbar decompression including bilateral  11/2008   brachytherapy     COLONOSCOPY     CYSTOSCOPY     Left L4-L5 lumbar laminotomy and microdiskectomy with  07/14/2008     LITHOTRIPSY     LUMBAR LAMINECTOMY/DECOMPRESSION MICRODISCECTOMY Left 08/18/2013   Procedure: Left Lumbar Five-Sacral One Microdiskectomy;  Surgeon: [name], MD;  Location: [facility];  Service: Neurosurgery;  Laterality: Left;  Left Lumbar Five-Sacral One Microdiskectomy   rotator cuff surgery     Scleral buckle, laser photocoagulation, and anterior chamber  01/04/2009    Social History   Socioeconomic History   Marital status: Married    Spouse name: [name]   Number of children: 2   Years of education: 12   Highest education level: Not on file  Occupational History   Not on file  Tobacco Use   Smoking status: Former    Types: Cigarettes    Quit date: 02/12/1973    Years since quitting: 49.4   Smokeless tobacco: Never   Tobacco comments:    starting at age 38   Vaping Use   Vaping Use: Never used  Substance and Sexual Activity   Alcohol use: No   Drug use: No   Sexual activity: Yes  Other Topics Concern   Not on file  Social History Narrative   Lives at home with wife   Drinks 4 cups of coffee a day   Social Determinants of Health   Financial Resource Strain: Not on file  Food Insecurity: Not on file  Transportation Needs: Not on file  Physical Activity: Not on file  Stress: Not on file  Social Connections: Not on file    Family History  Problem Relation Age of Onset   Heart failure Father    Heart failure Mother    Heart failure Brother        Half brother.   Colon cancer Neg Hx     Outpatient Encounter Medications as of 07/27/2022  Medication Sig   Accu-Chek FastClix Lancets MISC Use to check blood glucose twice  daily   atorvastatin (LIPITOR) 10 MG tablet Take 1 tablet (10 mg total) by mouth daily.   B-D ULTRAFINE III SHORT PEN 31G X 8 MM MISC USE 1 PEN NEEDLE AS DIRECTED   Blood Glucose Monitoring Suppl (FREESTYLE FREEDOM LITE) w/Device KIT 1 each by Does not apply route 4 (four) times daily. USE BLOOD GLUCOSE MONITOR TO CHECK BLOOD SUGAR.   cyanocobalamin (VITAMIN B12) 1000 MCG tablet Take 1 tablet (1,000 mcg total) by mouth daily.   diltiazem (CARDIZEM CD) 240 MG 24 hr capsule TAKE 1 CAPSULE DAILY   ELIQUIS 5 MG TABS tablet TAKE 1 TABLET TWICE A DAY   ezetimibe (ZETIA) 10 MG tablet TAKE 1 TABLET DAILY   GLIPIZIDE XL 5 MG 24 hr tablet TAKE 1 TABLET DAILY WITH BREAKFAST   glucose blood (FREESTYLE LITE) test strip USE TO CHECK BLOOD SUGAR THREE TIMES A DAY AS INSTRUCTED   insulin glargine (LANTUS SOLOSTAR) 100 UNIT/ML Solostar Pen Inject 50 Units into the skin at bedtime.   Lancets (FREESTYLE) lancets Use as instructed to check blood sugar 4 times per day dx code 250.00   Lancets Thin MISC 1 each by Does not apply route 3 (three) times daily.   memantine (NAMENDA) 10 MG tablet Take 1 tablet (10 mg total) by mouth daily.    metoprolol succinate (TOPROL-XL) 25 MG 24 hr tablet TAKE 1 TABLET DAILY WITH OR IMMEDIATELY FOLLOWING A MEAL   ondansetron (ZOFRAN) 4 MG tablet Take 1 tablet (4 mg total) by mouth every 6 (six) hours as needed for nausea or vomiting.   oxyCODONE-acetaminophen (PERCOCET/ROXICET) 5-325 MG tablet Take 1 tablet by mouth every 6 (six) hours as needed for severe pain.   SYNTHROID 25 MCG tablet TAKE 1 TABLET DAILY BEFORE BREAKFAST   [DISCONTINUED] insulin glargine (LANTUS SOLOSTAR) 100 UNIT/ML Solostar Pen Inject 44 Units into the skin at bedtime.   No facility-administered encounter medications on file as of 07/27/2022.    ALLERGIES: Allergies  Allergen Reactions   Beta Adrenergic Blockers Other (See Comments)    Severe dizziness   Other Other (See Comments)    Beta or alpha blockers: severe dizziness   Tramadol Other (See Comments)    Dizziness     VACCINATION STATUS: Immunization History  Administered Date(s) Administered   Influenza Split 02/12/2013   Influenza, High Dose Seasonal PF 03/26/2018   Influenza,inj,Quad PF,6+ Mos 02/26/2015   Influenza-Unspecified 02/17/2014   Pneumococcal Conjugate-13 11/26/2014   Tdap 07/21/2012    Diabetes He presents for his follow-up diabetic visit. He has type 2 diabetes mellitus. Onset time: He was diagnosed at approximate age of 33 years. His disease course has been worsening. There are no hypoglycemic associated symptoms. Pertinent negatives for hypoglycemia include no confusion, headaches, pallor or seizures. Pertinent negatives for diabetes include no chest pain, no fatigue, no polydipsia, no polyphagia, no polyuria and no weakness. There are no hypoglycemic complications. Symptoms are worsening. Diabetic complications include nephropathy and retinopathy. Risk factors for coronary artery disease include dyslipidemia, family history, male sex, sedentary lifestyle and tobacco exposure. Current diabetic treatment includes insulin injections and oral  agent (monotherapy) (He is currently on Lantus 44 units nightly, glipizide 5 mg daily.). He is compliant with treatment most of the time. His weight is increasing steadily. He is following a generally unhealthy diet. When asked about meal planning, he reported none. He has not had a previous visit with a dietitian. He participates in exercise intermittently. His home blood glucose trend is increasing steadily. His bedtime  blood glucose range is generally >200 mg/dl. His overall blood glucose range is >200 mg/dl. (Patient is experiencing worsening cognitive decline.  He is being cared for by his  elderly overwhelmed wife.  His point-of-care A1c today is increasig from 8.5% to 9.1%. He did not bring any meter nor logs, denies hypoglycemia. The family reports his average glucose readings are above 200 mg/dl.    He did not document hypoglycemia, however he does not monitor blood glucose in the morning since he starts eating at 4 AM.     ) An ACE inhibitor/angiotensin II receptor blocker is being taken. Eye exam is current.  Hyperlipidemia This is a chronic problem. The current episode started more than 1 year ago. The problem is controlled. Exacerbating diseases include diabetes and obesity. Pertinent negatives include no chest pain, myalgias or shortness of breath. Current antihyperlipidemic treatment includes statins. Risk factors for coronary artery disease include diabetes mellitus, dyslipidemia, hypertension, male sex and a sedentary lifestyle.  Hypertension This is a chronic problem. The current episode started more than 1 year ago. Pertinent negatives include no chest pain, headaches, neck pain, palpitations or shortness of breath. Risk factors for coronary artery disease include dyslipidemia, diabetes mellitus, male gender, obesity, sedentary lifestyle and smoking/tobacco exposure. Past treatments include ACE inhibitors. Hypertensive end-organ damage includes retinopathy.     Review of  systems  Constitutional: + Minimally fluctuating body weight,  current  Body mass index is 30.56 kg/m. , no fatigue, no subjective hyperthermia, no subjective hypothermia     Objective:    BP 128/70   Pulse 68   Ht '5\' 10"'$  (1.778 m)   Wt 213 lb (96.6 kg)   BMI 30.56 kg/m   Wt Readings from Last 3 Encounters:  07/27/22 213 lb (96.6 kg)  06/29/22 216 lb 6.4 oz (98.2 kg)  03/13/22 210 lb (95.3 kg)    Physical Exam- Limited  Constitutional:  Body mass index is 30.56 kg/m. , not in acute distress, normal state of mind    CMP     Component Value Date/Time   NA 136 (A) 03/15/2022 0000   K 4.9 03/15/2022 0000   CL 101 03/15/2022 0000   CO2 25 (A) 03/15/2022 0000   GLUCOSE 183 (H) 03/13/2022 1336   BUN 30 (A) 03/15/2022 0000   CREATININE 2.1 (A) 03/15/2022 0000   CREATININE 1.85 (H) 03/13/2022 1336   CREATININE 1.50 (H) 03/25/2020 0752   CALCIUM 9.3 03/15/2022 0000   PROT 8.2 (H) 03/13/2022 1336   PROT 6.7 01/19/2022 0845   ALBUMIN 3.9 03/15/2022 0000   ALBUMIN 4.0 01/19/2022 0845   AST 11 (A) 03/15/2022 0000   ALT 10 03/15/2022 0000   ALKPHOS 62 03/15/2022 0000   BILITOT 1.0 03/13/2022 1336   BILITOT 0.4 01/19/2022 0845   GFRNONAA 36 (L) 03/13/2022 1336   GFRNONAA 44 (L) 03/25/2020 0752   GFRAA 51 (L) 03/25/2020 0752     Diabetic Labs (most recent): Lab Results  Component Value Date   HGBA1C 9.1 07/21/2022   HGBA1C 8.5 (A) 01/26/2022   HGBA1C 8.7 (A) 09/08/2021   MICROALBUR 0.4 09/16/2019   MICROALBUR 0.4 09/30/2018   MICROALBUR 0.8 09/20/2017     Lipid Panel ( most recent) Lipid Panel     Component Value Date/Time   CHOL 121 03/15/2022 0000   CHOL 130 01/19/2022 0845   TRIG 66 03/15/2022 0000   HDL 55 03/14/2022 0000   HDL 51 01/19/2022 0845   CHOLHDL 2.5 01/19/2022 0845  CHOLHDL 2.8 02/11/2020 1000   VLDL 12 02/11/2020 1000   LDLCALC 51 03/15/2022 0000   LDLCALC 66 01/19/2022 0845   LDLCALC 130 (H) 09/16/2019 0809      Lab Results   Component Value Date   TSH 6.43 (A) 03/15/2022   TSH 2.698 01/28/2022   TSH 3.500 01/19/2022   TSH 3.870 06/02/2021   TSH 5.410 (H) 10/01/2020   TSH 5.54 (H) 03/25/2020   TSH 3.70 09/16/2019   TSH 3.05 01/14/2019   TSH 4.80 (H) 09/30/2018   TSH 3.27 12/21/2017   FREET4 1.32 01/19/2022   FREET4 1.24 06/02/2021   FREET4 1.15 10/01/2020   FREET4 1.0 03/25/2020   FREET4 1.0 09/16/2019   FREET4 1.2 01/14/2019   FREET4 0.9 09/30/2018   FREET4 0.92 06/11/2017   FREET4 0.95 05/22/2016   FREET4 0.92 06/16/2015      Assessment & Plan:   1. Uncontrolled type 2 diabetes mellitus with hyperglycemia ([place])  - Jonathan Hensley has currently uncontrolled symptomatic type 2 DM since  82 years of age.  Patient is experiencing worsening cognitive decline.  He is being cared for by his  elderly overwhelmed wife.  His point-of-care A1c today is increasig from 8.5% to 9.1%. He did not bring any meter nor logs, denies hypoglycemia. The family reports his average glucose readings are above 200 mg/dl.    He did not document hypoglycemia, however he does not monitor blood glucose in the morning since he starts eating at 4 AM.    -his diabetes is complicated by retinopathy and he remains at a high risk for more acute and chronic complications which include CAD, CVA, CKD, retinopathy, and neuropathy. These are all discussed in detail with him.  - I have counseled him on diet management and weight loss, by adopting a carbohydrate restricted/protein rich diet.  - he acknowledges that there is a room for improvement in his food and drink choices. - Suggestion is made for him to avoid simple carbohydrates  from his diet including Cakes, Sweet Desserts, Ice Cream, Soda (diet and regular), Sweet Tea, Candies, Chips, Cookies, Store Bought Juices, Alcohol , Artificial Sweeteners,  Coffee Creamer, and "Sugar-free" Products, Lemonade. This will help patient to have more stable blood glucose profile and potentially  avoid unintended weight gain.  The following Lifestyle Medicine recommendations according to [place]  [facility]) were discussed and and offered to patient and he  agrees to start the journey:  A. Whole Foods, Plant-Based Nutrition comprising of fruits and vegetables, plant-based proteins, whole-grain carbohydrates was discussed in detail with the patient.   A list for source of those nutrients were also provided to the patient.  Patient will use only water or unsweetened tea for hydration. B.  The need to stay away from risky substances including alcohol, smoking; obtaining 7 to 9 hours of restorative sleep, at least 150 minutes of moderate intensity exercise weekly, the importance of healthy social connections,  and stress management techniques were discussed.  - he is advised to stick to a routine mealtimes to eat 3 meals  a day and avoid unnecessary snacks ( to snack only to correct hypoglycemia).   - I have approached him with the following individualized plan to manage diabetes and patient agrees:    -Priority in this patient is to avoid hypoglycemia which the family or is about to get   He does not monitor blood glucose in the morning due to the fact that he starts eating  at 4 AM.  His wife is not awake at that time to check for him.  He is hesitant but advised to increase his Lantus slightly to 50 units nightly, advised to continue glipizide 5 mg daily with breakfast.  His care is assisted and  coordinated by his wife.  -I requested for the couple to monitor blood glucose at least twice a day as much as possible-daily before breakfast and at bedtime.  The low-dose glipizide may help control postprandial glycemic profile.   - it will not be practical to provide multiple daily injections of insulin regimen to this patient without adequate social support at home. - Patient specific target  A1c;  LDL, HDL, Triglycerides,  were discussed in detail.  2) Blood  Pressure /Hypertension: -His blood pressure is controlled to target.  he is advised to continue his current medications including benazepril 40 mg p.o. daily with breakfast.  3) Lipids/Hyperlipidemia: Review of his recent lipid panel show improved LDL at 66, improving from 130.  He is advised to continue simvastatin 40 mg p.o. daily at bedtime.     Also on Zetia 10 mg p.o. nightly.     Side effects and precautions discussed with him.  4)  Weight/Diet: His BMI is 30.56.   Exercise, and detailed carbohydrates information provided  -  detailed on discharge instructions.   5) Hypothyroidism:  His recent thyroid function tests were consistent with appropriate replacement.  He is advised to continue Synthroid 25 mcg p.o. daily before breakfast.   - We discussed about the correct intake of his thyroid hormone, on empty stomach at fasting, with water, separated by at least 30 minutes from breakfast and other medications,  and separated by more than 4 hours from calcium, iron, multivitamins, acid reflux medications (PPIs). -Patient is made aware of the fact that thyroid hormone replacement is needed for life, dose to be adjusted by periodic monitoring of thyroid function tests.   6) Chronic Care/Health Maintenance:  -he  is on ACEI/ARB and Statin medications and  is encouraged to initiate and continue to follow up with Ophthalmology, Dentist,  Podiatrist at least yearly or according to recommendations, and advised to   stay away from smoking. I have recommended yearly flu vaccine and pneumonia vaccine at least every 5 years; moderate intensity exercise for up to 150 minutes weekly; and  sleep for at least 7 hours a day.   - he is  advised to maintain close follow up with his PMD  for primary care needs, as well as his other providers for optimal and coordinated care.   I spent  26  minutes in the care of the patient today including review of labs from [place], Lipids, Thyroid Function, Hematology  (current and previous including abstractions from other facilities); face-to-face time discussing  his blood glucose readings/logs, discussing hypoglycemia and hyperglycemia episodes and symptoms, medications doses, his options of short and long term treatment based on the latest standards of care / guidelines;  discussion about incorporating lifestyle medicine;  and documenting the encounter. Risk reduction counseling performed per USPSTF guidelines to reduce  obesity and cardiovascular risk factors.     Please refer to Patient Instructions for Blood Glucose Monitoring and Insulin/Medications Dosing Guide"  in media tab for additional information. Please  also refer to " Patient Self Inventory" in the Media  tab for reviewed elements of pertinent patient history.  Jonathan Hensley participated in the discussions, expressed understanding, and voiced agreement with the above plans.  All  questions were answered to his satisfaction. he is encouraged to contact clinic should he have any questions or concerns prior to his return visit.    Follow up plan: - Return in about 3 months (around 10/27/2022) for F/U with Pre-visit Labs, Meter/CGM/Logs, A1c here.  Glade Lloyd, MD [facility] [address] [place], [place] [address] Phone: [phone]  Fax: [phone]    07/27/2022, 1:20 PM  This note was partially dictated with voice recognition software. Similar sounding words can be transcribed inadequately or may not  be corrected upon review.

## 2022-07-28 ENCOUNTER — Telehealth: Payer: Self-pay | Admitting: "Endocrinology

## 2022-07-28 NOTE — Telephone Encounter (Signed)
Pt's wife wants confirmation for how low pt's BG can be and still give him his insulin.

## 2022-07-28 NOTE — Telephone Encounter (Signed)
Pt's wife left a VM stating that last night after he ate his sugar was 136 and she did not give him insulin. She wants to know if she should have give insulin, she said this morning his sugar was 117. Please call Mrs. Jonathan Hensley

## 2022-07-28 NOTE — Telephone Encounter (Signed)
Tried to call pt's wife, left a detailed message that if pt's glucose is less than 100 at bedtime for pt to eat a snack and still give him his insulin and to please contact the office if his morning readings are less than 70mg /dl.

## 2022-08-04 ENCOUNTER — Telehealth: Payer: Self-pay

## 2022-08-04 NOTE — Telephone Encounter (Signed)
Pt's wife called regarding BG.  BG readings:   Date Before breakfast Before lunch Before supper Bedtime  07/03/22 85   287  07/04/22 135   291  07/05/22 71   211 pt did eat a bowl of cereal before bed  07/06/22 34 pt was given a banana and grape juice and is now eating breakfast.  Wife stated she would check his BG as soon as he finishes breakfast       Pt taking: lantus 50 units qhs & glipizide xl 5mg  daily

## 2022-08-04 NOTE — Telephone Encounter (Signed)
Spoke with pt's wife, advised her to lower pt's lantus to 36 units qhs and continue checking his BG at least twice daily before breakfast and before bedtime, to contact the office if his BG <70 per Dr.Nida's orders. Pt's wife voiced understanding.

## 2022-08-10 ENCOUNTER — Emergency Department (HOSPITAL_COMMUNITY)
Admission: EM | Admit: 2022-08-10 | Discharge: 2022-08-10 | Disposition: A | Discharge status: Home / Self Care | Payer: Medicare Other | Attending: Student | Admitting: Student

## 2022-08-10 ENCOUNTER — Encounter (HOSPITAL_COMMUNITY): Payer: Self-pay | Admitting: *Deleted

## 2022-08-10 ENCOUNTER — Other Ambulatory Visit: Payer: Self-pay

## 2022-08-10 DIAGNOSIS — Z7901 Long term (current) use of anticoagulants: Secondary | ICD-10-CM | POA: Insufficient documentation

## 2022-08-10 DIAGNOSIS — F0390 Unspecified dementia without behavioral disturbance: Secondary | ICD-10-CM | POA: Diagnosis not present

## 2022-08-10 DIAGNOSIS — Z8546 Personal history of malignant neoplasm of prostate: Secondary | ICD-10-CM | POA: Diagnosis not present

## 2022-08-10 DIAGNOSIS — Z794 Long term (current) use of insulin: Secondary | ICD-10-CM | POA: Insufficient documentation

## 2022-08-10 DIAGNOSIS — M542 Cervicalgia: Secondary | ICD-10-CM | POA: Insufficient documentation

## 2022-08-10 DIAGNOSIS — I10 Essential (primary) hypertension: Secondary | ICD-10-CM | POA: Insufficient documentation

## 2022-08-10 DIAGNOSIS — Z7984 Long term (current) use of oral hypoglycemic drugs: Secondary | ICD-10-CM | POA: Diagnosis not present

## 2022-08-10 DIAGNOSIS — S161XXA Strain of muscle, fascia and tendon at neck level, initial encounter: Secondary | ICD-10-CM

## 2022-08-10 DIAGNOSIS — E119 Type 2 diabetes mellitus without complications: Secondary | ICD-10-CM | POA: Insufficient documentation

## 2022-08-10 DIAGNOSIS — Z79899 Other long term (current) drug therapy: Secondary | ICD-10-CM | POA: Insufficient documentation

## 2022-08-10 DIAGNOSIS — Z87891 Personal history of nicotine dependence: Secondary | ICD-10-CM | POA: Insufficient documentation

## 2022-08-10 HISTORY — DX: Unspecified dementia, unspecified severity, without behavioral disturbance, psychotic disturbance, mood disturbance, and anxiety: F03.90

## 2022-08-10 LAB — CBG MONITORING, ED: Glucose-Capillary: 147 mg/dL — ABNORMAL HIGH (ref 70–99)

## 2022-08-10 MED ORDER — LIDOCAINE 5 % EX PTCH
1.0000 | MEDICATED_PATCH | CUTANEOUS | Status: DC
  Administered 2022-08-10: 1 via TRANSDERMAL
  Filled 2022-08-10: qty 1

## 2022-08-10 MED ORDER — KETOROLAC TROMETHAMINE 15 MG/ML IJ SOLN
15.0000 mg | Freq: Once | INTRAMUSCULAR | Status: AC
  Administered 2022-08-10: 15 mg via INTRAMUSCULAR
  Filled 2022-08-10: qty 1

## 2022-08-10 MED ORDER — LIDOCAINE 5 % EX PTCH: 1.0000 | MEDICATED_PATCH | CUTANEOUS | 0 refills | Status: DC

## 2022-08-10 MED ORDER — ACETAMINOPHEN 500 MG PO TABS
1000.0000 mg | ORAL_TABLET | Freq: Once | ORAL | Status: AC
  Administered 2022-08-10: 1000 mg via ORAL
  Filled 2022-08-10: qty 2

## 2022-08-10 NOTE — ED Triage Notes (Signed)
Pt's wife reports pt has been c/o pain under his left eye that radiates to his left ear and then to the back of his head/neck that started sometime during the night. Wife reports pt has dementia so she isn't sure exactly what time the pain started. Not aware of any new visual disturbances.

## 2022-08-10 NOTE — ED Provider Notes (Signed)
[facility] Provider Note  CSN: [id number] Arrival date & time: 08/10/22 [id number]  Chief Complaint(s) No chief complaint on file.  HPI Jonathan Hensley is a 82 y.o. male with PMH A-fib on Eliquis, prostate cancer, T2DM DM, chronic dysphagia and underlying dementia who presents emergency department for evaluation of left-sided neck pain.  History obtained primarily from patient's wife who states that the patient traditionally sleeps a few hours in his bed and then transitions to a chair in the living room early in the morning.  She found the patient in the chair this morning complaining of facial and left-sided neck pain with inability to turn his head.  Patient currently is having difficulty and wincing when engaging the SCM on the left but denies numbness, tingling, weakness or other neurologic complaints.  Denies chest pain, shortness of breath, abdominal pain, nausea, vomiting or other systemic complaints.   Past Medical History Past Medical History:  Diagnosis Date   Arthritis    rheumatoid in his hands   Atrial fibrillation (HCC)    Bulging lumbar disc    Cancer ([place])    prostate   DDD (degenerative disc disease), cervical    Dementia ([place])    Diabetes mellitus    Diabetic retinopathy ([place])    Double vision    Dysrhythmia    Atrial Fibrillation   History of prostate cancer    Hypertension    Low back pain    Nephrolithiasis    Sleep apnea    uses cpap   Patient Active Problem List   Diagnosis Date Noted   Hypothyroidism 10/11/2018   Uncontrolled type 2 diabetes mellitus with hyperglycemia ([place]) 09/30/2018   Essential hypertension, benign 09/30/2018   Word finding difficulty 12/01/2016   Obstructive sleep apnea on CPAP 11/02/2016   Atrial fibrillation with rapid ventricular response ([place]) 06/13/2016   Prostatitis 06/13/2016   Proctitis    Rectal bleeding    OSA (obstructive sleep apnea) 09/02/2014   HNP (herniated nucleus  pulposus), lumbar 08/18/2013   Uncontrolled type 2 diabetes mellitus with stable proliferative retinopathy of left eye, with long-term current use of insulin 12/03/2012   Anemia 12/03/2012   Occipital scalp laceration 12/19/2011   DM type 2 (diabetes mellitus, type 2) ([place]) 12/19/2011   Essential hypertension 12/19/2011   Diabetic retinopathy associated with type 2 diabetes mellitus ([place]) 12/19/2011   Mixed hyperlipidemia 12/19/2011   History of prostate cancer    Prostate cancer ([place]) 03/31/2011   Home Medication(s) Prior to Admission medications   Medication Sig Start Date End Date Taking? Authorizing Provider  Accu-Chek FastClix Lancets MISC Use to check blood glucose twice daily 07/24/22   Cassandria Anger, MD  atorvastatin (LIPITOR) 10 MG tablet Take 1 tablet (10 mg total) by mouth daily. 02/01/22   Adrian Prows, MD  B-D ULTRAFINE III SHORT PEN 31G X 8 MM MISC USE 1 PEN NEEDLE AS DIRECTED 05/10/22   Cassandria Anger, MD  Blood Glucose Monitoring Suppl (FREESTYLE FREEDOM LITE) w/Device KIT 1 each by Does not apply route 4 (four) times daily. USE BLOOD GLUCOSE MONITOR TO CHECK BLOOD SUGAR. 06/18/18   [name], MD  cyanocobalamin (VITAMIN B12) 1000 MCG tablet Take 1 tablet (1,000 mcg total) by mouth daily. 01/29/22   Horton, Barbette Hair, MD  diltiazem (CARDIZEM CD) 240 MG 24 hr capsule TAKE 1 CAPSULE DAILY 02/01/22   Adrian Prows, MD  ELIQUIS 5 MG TABS tablet TAKE 1 TABLET TWICE A DAY 06/12/22  Adrian Prows, MD  ezetimibe (ZETIA) 10 MG tablet TAKE 1 TABLET DAILY 07/04/22   Cassandria Anger, MD  GLIPIZIDE XL 5 MG 24 hr tablet TAKE 1 TABLET DAILY WITH BREAKFAST 02/27/22   Nida, Marella Chimes, MD  glucose blood (FREESTYLE LITE) test strip USE TO CHECK BLOOD SUGAR THREE TIMES A DAY AS INSTRUCTED 07/18/22   Cassandria Anger, MD  insulin glargine (LANTUS SOLOSTAR) 100 UNIT/ML Solostar Pen Inject 50 Units into the skin at bedtime. 07/27/22   Cassandria Anger, MD  Lancets (FREESTYLE)  lancets Use as instructed to check blood sugar 4 times per day dx code 250.00 12/10/13   [name], MD  Lancets Thin MISC 1 each by Does not apply route 3 (three) times daily. 07/18/22   Cassandria Anger, MD  memantine (NAMENDA) 10 MG tablet Take 1 tablet (10 mg total) by mouth daily. 01/29/22   Horton, Barbette Hair, MD  metoprolol succinate (TOPROL-XL) 25 MG 24 hr tablet TAKE 1 TABLET DAILY WITH OR IMMEDIATELY FOLLOWING A MEAL 07/24/22   Adrian Prows, MD  ondansetron (ZOFRAN) 4 MG tablet Take 1 tablet (4 mg total) by mouth every 6 (six) hours as needed for nausea or vomiting. 03/13/22   Blue, Soijett A, PA-C  oxyCODONE-acetaminophen (PERCOCET/ROXICET) 5-325 MG tablet Take 1 tablet by mouth every 6 (six) hours as needed for severe pain. 03/13/22   Blue, Soijett A, PA-C  SYNTHROID 25 MCG tablet TAKE 1 TABLET DAILY BEFORE BREAKFAST 05/16/22   Cassandria Anger, MD                                                                                                                                    Past Surgical History Past Surgical History:  Procedure Laterality Date   APPENDECTOMY     BACK SURGERY     Bilateral L4-5 lumbar decompression including bilateral  11/2008   brachytherapy     COLONOSCOPY     CYSTOSCOPY     Left L4-L5 lumbar laminotomy and microdiskectomy with  07/14/2008     LITHOTRIPSY     LUMBAR LAMINECTOMY/DECOMPRESSION MICRODISCECTOMY Left 08/18/2013   Procedure: Left Lumbar Five-Sacral One Microdiskectomy;  Surgeon: [name], MD;  Location: [facility];  Service: Neurosurgery;  Laterality: Left;  Left Lumbar Five-Sacral One Microdiskectomy   rotator cuff surgery     Scleral buckle, laser photocoagulation, and anterior chamber  01/04/2009   Family History Family History  Problem Relation Age of Onset   Heart failure Father    Heart failure Mother    Heart failure Brother        32 brother.   Colon cancer Neg Hx     Social History Social History   Tobacco Use    Smoking status: Former    Types: Cigarettes    Quit date: 02/12/1973    Years since quitting: 49.5   Smokeless tobacco: Never   Tobacco comments:  starting at age 26  Vaping Use   Vaping Use: Never used  Substance Use Topics   Alcohol use: No   Drug use: No   Allergies Beta adrenergic blockers, Other, and Tramadol  Review of Systems Review of Systems  Musculoskeletal:  Positive for neck pain.    Physical Exam Vital Signs  I have reviewed the triage vital signs BP (!) 142/77 (BP Location: Left Arm)   Pulse 78   Temp 97.6 F (36.4 C) (Oral)   Resp 16   Ht 5\' 10"  (1.778 m)   Wt 96.6 kg   SpO2 97%   BMI 30.56 kg/m   Physical Exam Constitutional:      General: He is not in acute distress.    Appearance: Normal appearance.  HENT:     Head: Normocephalic and atraumatic.     Nose: No congestion or rhinorrhea.  Eyes:     General:        Right eye: No discharge.        Left eye: No discharge.     Extraocular Movements: Extraocular movements intact.     Pupils: Pupils are equal, round, and reactive to light.  Cardiovascular:     Rate and Rhythm: Normal rate and regular rhythm.     Heart sounds: No murmur heard. Pulmonary:     Effort: No respiratory distress.     Breath sounds: No wheezing or rales.  Abdominal:     General: There is no distension.     Tenderness: There is no abdominal tenderness.  Musculoskeletal:        General: Tenderness present. Normal range of motion.     Cervical back: Normal range of motion.  Skin:    General: Skin is warm and dry.  Neurological:     General: No focal deficit present.     Mental Status: He is alert.     ED Results and Treatments Labs (all labs ordered are listed, but only abnormal results are displayed) Labs Reviewed  CBG MONITORING, ED - Abnormal; Notable for the following components:      Result Value   Glucose-Capillary 147 (*)    All other components within normal limits                                                                                                                           Radiology No results found.  Pertinent labs & imaging results that were available during my care of the patient were reviewed by me and considered in my medical decision making (see MDM for details).  Medications Ordered in ED Medications  lidocaine (LIDODERM) 5 % 1 patch (has no administration in time range)  acetaminophen (TYLENOL) tablet 1,000 mg (has no administration in time range)  Procedures Procedures  (including critical care time)  Medical Decision Making / ED Course   This patient presents to the ED for concern of neck pain, this involves an extensive number of treatment options, and is a complaint that carries with it a high risk of complications and morbidity.  The differential diagnosis includes cervical strain, torticollis, fracture, mass  MDM: Patient seen emergency room for evaluation of neck pain.  Physical exam with tenderness along the SCM on the left with minimal range of motion of the neck consistent with acute torticollis.  Patient has no midline C-spine tenderness and neurologic exam is unremarkable with no focal motor or sensory deficits.  Given benign exam and reproducible tenderness along the specific muscle group, advanced imaging deferred.  Patient received Lidoderm patch and Tylenol and symptoms did start to improve.  He then received single dose Toradol leading to significant improvement of his symptoms and increased range of motion of his neck.  We did have a discussion about the use of NSAIDs while on his Eliquis and that single dose in the emergency department likely as not to cause significant platelet dysfunction but he still needs to listen to his physicians and not take consistent NSAIDs at home on a regular basis.  Patient then discharged with  outpatient follow-up and a prescription for Lidoderm patches with instructions to use a heat pack at home   Additional history obtained: -Additional history obtained from wife -External records from outside source obtained and reviewed including: Chart review including previous notes, labs, imaging, consultation notes   Lab Tests: -I ordered, reviewed, and interpreted labs.   The pertinent results include:   Labs Reviewed  CBG MONITORING, ED - Abnormal; Notable for the following components:      Result Value   Glucose-Capillary 147 (*)    All other components within normal limits     Medicines ordered and prescription drug management: Meds ordered this encounter  Medications   lidocaine (LIDODERM) 5 % 1 patch   acetaminophen (TYLENOL) tablet 1,000 mg    -I have reviewed the patients home medicines and have made adjustments as needed  Critical interventions none   Cardiac Monitoring: The patient was maintained on a cardiac monitor.  I personally viewed and interpreted the cardiac monitored which showed an underlying rhythm of: NSR  Social Determinants of Health:  Factors impacting patients care include: none   Reevaluation: After the interventions noted above, I reevaluated the patient and found that they have :improved  Co morbidities that complicate the patient evaluation  Past Medical History:  Diagnosis Date   Arthritis    rheumatoid in his hands   Atrial fibrillation (HCC)    Bulging lumbar disc    Cancer ([place])    prostate   DDD (degenerative disc disease), cervical    Dementia (HCC)    Diabetes mellitus    Diabetic retinopathy ([place])    Double vision    Dysrhythmia    Atrial Fibrillation   History of prostate cancer    Hypertension    Low back pain    Nephrolithiasis    Sleep apnea    uses cpap      Dispostion: I considered admission for this patient, but at this time he does not meet inpatient criteria for admission he is safe for discharge with  outpatient follow-up     Final Clinical Impression(s) / ED Diagnoses Final diagnoses:  None     @PCDICTATION @    Kyleena Scheirer, Debe Coder, MD 08/10/22 [phone]

## 2022-08-10 NOTE — Discharge Instructions (Addendum)
For pain:  - Acetaminophen 1000 mg three times daily (every 8 hours) - lidoderm patches twice daily - heat pack

## 2022-08-15 ENCOUNTER — Telehealth: Payer: Self-pay

## 2022-08-15 NOTE — Telephone Encounter (Signed)
Pt's wife called regarding BG.   BG readings.   Date Before breakfast Before lunch Before supper Bedtime  08/13/22 22 pt's wife states she "overcompensated" for the low   434 took 36 units of lantus  08/14/22 326   256 took 36 units of lantus  08/15/22 47             Pt taking: lantus 36 units qhs. States pt eats dinner between 5-7p.m. and she is not always able to get him to eat a snack before bed.

## 2022-08-15 NOTE — Telephone Encounter (Signed)
Spoke with pt's wife, advised her to lower pt's lantus to 30 units qhs and to call the office if his FBG is >200 3 days of the week or if FBG is below 70 per Dr.Nida's orders. Understanding voiced.

## 2022-08-18 ENCOUNTER — Telehealth: Payer: Self-pay

## 2022-08-18 NOTE — Telephone Encounter (Signed)
Left a message requesting pt's wife to return call to the office. 

## 2022-08-28 ENCOUNTER — Ambulatory Visit (INDEPENDENT_AMBULATORY_CARE_PROVIDER_SITE_OTHER): Payer: Medicare Other | Admitting: Urology

## 2022-08-28 ENCOUNTER — Encounter: Payer: Self-pay | Admitting: Urology

## 2022-08-28 VITALS — BP 113/75 | HR 85

## 2022-08-28 DIAGNOSIS — N3942 Incontinence without sensory awareness: Secondary | ICD-10-CM

## 2022-08-28 DIAGNOSIS — Z8546 Personal history of malignant neoplasm of prostate: Secondary | ICD-10-CM

## 2022-08-28 LAB — MICROSCOPIC EXAMINATION: Bacteria, UA: NONE SEEN

## 2022-08-28 LAB — URINALYSIS, ROUTINE W REFLEX MICROSCOPIC
Bilirubin, UA: NEGATIVE
Leukocytes,UA: NEGATIVE
Nitrite, UA: NEGATIVE
Specific Gravity, UA: 1.02 (ref 1.005–1.030)
Urobilinogen, Ur: 0.2 mg/dL (ref 0.2–1.0)
pH, UA: 5.5 (ref 5.0–7.5)

## 2022-08-28 LAB — BLADDER SCAN AMB NON-IMAGING: Scan Result: 0

## 2022-08-28 NOTE — Progress Notes (Signed)
08/28/2022 1:31 PM   Jonathan Hensley 10-07-1940 [id number]  Referring provider: Wilmon Pali, FNP [address] [place],  [place] [address]  No chief complaint on file.   HPI:  Patient of Dr. Retta Diones - "[name]" - added on for incontinence. He had a sudden loss of urine and wet his pants. No urge. He seemed unaware. NG risk includes dementia. This has resolved. No dysuria or gross hematuria.   UA today 0-5 white, 0-2 red, no bacteria.  Bladder scan today PVR 0.  GU history:  1) kidney stones-patient passed 5 mm left UVJ stone in 2023 with a punctate stone remaining in the left kidney on CT.  Renal ultrasound January 2024 noted there to be no hydronephrosis, possible 7 mm left renal stone.  Prior remote history of stones.  2) history of prostate cancer-treated with ADT and external beam.  A 2019 PSA was undetectable.  A January 2024 PSA was undetectable.  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    PMH: Past Medical History:  Diagnosis Date   Arthritis    rheumatoid in his hands   Atrial fibrillation (HCC)    Bulging lumbar disc    Cancer (HCC)    prostate   DDD (degenerative disc disease), cervical    Dementia (HCC)    Diabetes mellitus    Diabetic retinopathy (HCC)    Double vision    Dysrhythmia    Atrial Fibrillation   History of prostate cancer    Hypertension    Low back pain    Nephrolithiasis    Sleep apnea    uses cpap    Surgical History: Past Surgical History:  Procedure Laterality Date   APPENDECTOMY     BACK SURGERY     Bilateral L4-5 lumbar decompression including bilateral  11/2008   brachytherapy     COLONOSCOPY     CYSTOSCOPY     Left L4-L5 lumbar laminotomy and microdiskectomy with  07/14/2008     LITHOTRIPSY     LUMBAR LAMINECTOMY/DECOMPRESSION MICRODISCECTOMY Left 08/18/2013   Procedure: Left Lumbar Five-Sacral One Microdiskectomy;  Surgeon: Hewitt Shorts, MD;  Location: [facility];  Service: Neurosurgery;  Laterality: Left;   Left Lumbar Five-Sacral One Microdiskectomy   rotator cuff surgery     Scleral buckle, laser photocoagulation, and anterior chamber  01/04/2009    Home Medications:  Allergies as of 08/28/2022       Reactions   Beta Adrenergic Blockers Other (See Comments)   Severe dizziness   Other Other (See Comments)   Beta or alpha blockers: severe dizziness   Tramadol Other (See Comments)   Dizziness         Medication List        Accurate as of August 28, 2022  1:31 PM. If you have any questions, ask your nurse or doctor.          atorvastatin 10 MG tablet Commonly known as: LIPITOR Take 1 tablet (10 mg total) by mouth daily.   B-D ULTRAFINE III SHORT PEN 31G X 8 MM Misc Generic drug: Insulin Pen Needle USE 1 PEN NEEDLE AS DIRECTED   cyanocobalamin 1000 MCG tablet Commonly known as: VITAMIN B12 Take 1 tablet (1,000 mcg total) by mouth daily.   diltiazem 240 MG 24 hr capsule Commonly known as: CARDIZEM CD TAKE 1 CAPSULE DAILY   Eliquis 5 MG Tabs tablet Generic drug: apixaban TAKE 1 TABLET TWICE A DAY   ezetimibe 10  MG tablet Commonly known as: ZETIA TAKE 1 TABLET DAILY   FreeStyle Freedom Lite w/Device Kit 1 each by Does not apply route 4 (four) times daily. USE BLOOD GLUCOSE MONITOR TO CHECK BLOOD SUGAR.   freestyle lancets Use as instructed to check blood sugar 4 times per day dx code 250.00   Lancets Thin Misc 1 each by Does not apply route 3 (three) times daily.   Accu-Chek FastClix Lancets Misc Use to check blood glucose twice daily   FREESTYLE LITE test strip Generic drug: glucose blood USE TO CHECK BLOOD SUGAR THREE TIMES A DAY AS INSTRUCTED   glipiZIDE XL 5 MG 24 hr tablet Generic drug: glipiZIDE TAKE 1 TABLET DAILY WITH BREAKFAST   Lantus SoloStar 100 UNIT/ML Solostar Pen Generic drug: insulin glargine Inject 50 Units into the skin at bedtime.   lidocaine 5 % Commonly known as: Lidoderm Place 1 patch onto the skin daily. Remove & Discard patch  within 12 hours or as directed by MD   memantine 10 MG tablet Commonly known as: Namenda Take 1 tablet (10 mg total) by mouth daily.   metoprolol succinate 25 MG 24 hr tablet Commonly known as: TOPROL-XL TAKE 1 TABLET DAILY WITH OR IMMEDIATELY FOLLOWING A MEAL   ondansetron 4 MG tablet Commonly known as: ZOFRAN Take 1 tablet (4 mg total) by mouth every 6 (six) hours as needed for nausea or vomiting.   oxyCODONE-acetaminophen 5-325 MG tablet Commonly known as: PERCOCET/ROXICET Take 1 tablet by mouth every 6 (six) hours as needed for severe pain.   Synthroid 25 MCG tablet Generic drug: levothyroxine TAKE 1 TABLET DAILY BEFORE BREAKFAST        Allergies:  Allergies  Allergen Reactions   Beta Adrenergic Blockers Other (See Comments)    Severe dizziness   Other Other (See Comments)    Beta or alpha blockers: severe dizziness   Tramadol Other (See Comments)    Dizziness     Family History: Family History  Problem Relation Age of Onset   Heart failure Father    Heart failure Mother    Heart failure Brother        Half brother.   Colon cancer Neg Hx     Social History:  reports that he quit smoking about 49 years ago. His smoking use included cigarettes. He has never used smokeless tobacco. He reports that he does not drink alcohol and does not use drugs.   Physical Exam: BP 113/75   Pulse 85   Constitutional:  Alert and oriented, No acute distress. HEENT: [place] AT, moist mucus membranes.  Trachea midline, no masses. Cardiovascular: No clubbing, cyanosis, or edema. Respiratory: Normal respiratory effort, no increased work of breathing. GI: Abdomen is soft, nontender, nondistended, no abdominal masses GU: No CVA tenderness Skin: No rashes, bruises or suspicious lesions. Neurologic: Grossly intact, no focal deficits, moving all 4 extremities. Psychiatric: Normal mood and affect.  Laboratory Data: Lab Results  Component Value Date   WBC 12.5 (H) 03/13/2022   HGB  13.4 03/13/2022   HCT 39.6 03/13/2022   MCV 93.4 03/13/2022   PLT 269 03/13/2022    Lab Results  Component Value Date   CREATININE 2.1 (A) 03/15/2022    Lab Results  Component Value Date   PSA 0.00 Repeated and verified X2. (L) 09/20/2017    No results found for: "TESTOSTERONE"  Lab Results  Component Value Date   HGBA1C 9.1 07/21/2022    Urinalysis    Component Value Date/Time  COLORURINE YELLOW 03/13/2022 1219   APPEARANCEUR Clear 06/13/2022 1326   LABSPEC 1.023 03/13/2022 1219   PHURINE 5.0 03/13/2022 1219   GLUCOSEU Negative 06/13/2022 1326   GLUCOSEU NEGATIVE 10/19/2015 1109   HGBUR SMALL (A) 03/13/2022 1219   BILIRUBINUR Negative 06/13/2022 1326   KETONESUR 5 (A) 03/13/2022 1219   PROTEINUR 1+ (A) 06/13/2022 1326   PROTEINUR 100 (A) 03/13/2022 1219   UROBILINOGEN 0.2 10/19/2015 1109   NITRITE Negative 06/13/2022 1326   NITRITE NEGATIVE 03/13/2022 1219   LEUKOCYTESUR Negative 06/13/2022 1326   LEUKOCYTESUR NEGATIVE 03/13/2022 1219    Lab Results  Component Value Date   LABMICR See below: 06/13/2022   WBCUA None seen 06/13/2022   LABEPIT 0-10 06/13/2022   MUCUS Present (A) 06/13/2022   BACTERIA None seen 06/13/2022    Pertinent Imaging:   Results for orders placed during the hospital encounter of 05/18/22  Ultrasound renal complete  Narrative CLINICAL DATA:  Follow-up renal stones  EXAM: RENAL / URINARY TRACT ULTRASOUND COMPLETE  COMPARISON:  CT abdomen pelvis 03/13/2022  FINDINGS: Right Kidney:  Renal measurements: 10.6 x 5.4 x 6.2 cm = volume: 186.4 mL. Echogenicity within normal limits. No mass or hydronephrosis visualized.  Left Kidney:  Renal measurements: 11.0 x 5.5 x 5.9 cm = volume: 186.5 mL. Echogenicity within normal limits. No mass or hydronephrosis visualized. Note is made of a 7 mm stone inferior pole left kidney.  Bladder:  Appears normal for degree of bladder distention.  Other:  None.  IMPRESSION: 1. No  hydronephrosis. 2. Nonobstructing stone inferior pole left kidney.   Electronically Signed By: Annia Belt M.D. On: 05/18/2022 11:57  No valid procedures specified. No results found for this or any previous visit.  Results for orders placed during the hospital encounter of 05/16/19  CT Renal Stone Study  Narrative CLINICAL DATA:  Left flank pain for 2 days. Nausea. History of diabetes. Prostate cancer. Hypertension. Prior lithotripsy. Appendectomy.  EXAM: CT ABDOMEN AND PELVIS WITHOUT CONTRAST  TECHNIQUE: Multidetector CT imaging of the abdomen and pelvis was performed following the standard protocol without IV contrast.  COMPARISON:  06/13/2016  FINDINGS: Lower chest: Clear lung bases. Normal heart size without pericardial or pleural effusion. Left circumflex coronary artery atherosclerosis.  Hepatobiliary: Well-circumscribed low-density liver lesions are likely cysts. Normal gallbladder, without biliary ductal dilatation.  Pancreas: Fatty replacement involving the pancreatic head and uncinate process. No duct dilatation.  Spleen: Normal in size, without focal abnormality.  Adrenals/Urinary Tract: Normal right adrenal gland. Mild left adrenal thickening and nodularity are similar. Mild left renal cortical thinning. Left renal vascular calcification. No renal calculi or hydronephrosis. No hydroureter or ureteric calculi. No bladder calculi.  Stomach/Bowel: Normal stomach, without wall thickening. Colonic stool burden suggests constipation. Normal terminal ileum. Normal small bowel.  Vascular/Lymphatic: Aortic and branch vessel atherosclerosis. No abdominopelvic adenopathy.  Reproductive: Radiation seeds in the prostate.  Other: No significant free fluid.  Musculoskeletal: No acute osseous abnormality. L4-5 trans pedicle screw fixation  IMPRESSION: 1. Left renal vascular calcifications, without convincing evidence of renal calculi. No obstructive  uropathy or distal stone. 2.  Possible constipation. 3. Coronary artery atherosclerosis. Aortic Atherosclerosis (ICD10-I70.0). 4. Radiation seeds in the prostate; no evidence of metastatic disease.   Electronically Signed By: Jeronimo Greaves M.D. On: 05/16/2019 10:00   Assessment & Plan:    1. Urinary incontinence  - his UA is clear. No UTI. PVR 0. No retention. Continue to monitor, discussed oab meds but many side effects.  2. H/o  PCa - PDA udt.  - Urinalysis, Routine w reflex microscopic - BLADDER SCAN AMB NON-IMAGING  They are moving to [place], [place].   No follow-ups on file.  Jerilee Field, MD  [facility]  [address] [place], [place] [address] [phone]

## 2022-08-28 NOTE — Progress Notes (Signed)
Pt here today for bladder scan. Bladder was scanned and 0 was visualized.    Performed by Gustie Bobb, CMA  

## 2022-09-04 ENCOUNTER — Telehealth: Payer: Self-pay | Admitting: Cardiology

## 2022-09-18 ENCOUNTER — Other Ambulatory Visit: Payer: Self-pay | Admitting: Cardiology

## 2022-10-18 ENCOUNTER — Other Ambulatory Visit: Payer: Self-pay

## 2022-10-18 DIAGNOSIS — E1165 Type 2 diabetes mellitus with hyperglycemia: Secondary | ICD-10-CM

## 2022-10-18 MED ORDER — LANTUS SOLOSTAR 100 UNIT/ML ~~LOC~~ SOPN: 30.0000 [IU] | PEN_INJECTOR | Freq: Every day | SUBCUTANEOUS | 1 refills | Status: DC

## 2022-11-03 ENCOUNTER — Ambulatory Visit: Payer: Medicare Other | Admitting: "Endocrinology

## 2022-11-06 ENCOUNTER — Ambulatory Visit (INDEPENDENT_AMBULATORY_CARE_PROVIDER_SITE_OTHER): Payer: Medicare Other | Admitting: "Endocrinology

## 2022-11-06 ENCOUNTER — Encounter: Payer: Self-pay | Admitting: "Endocrinology

## 2022-11-06 VITALS — BP 123/84 | HR 76 | Ht 70.0 in | Wt 210.4 lb

## 2022-11-06 DIAGNOSIS — I10 Essential (primary) hypertension: Secondary | ICD-10-CM | POA: Diagnosis not present

## 2022-11-06 DIAGNOSIS — Z794 Long term (current) use of insulin: Secondary | ICD-10-CM

## 2022-11-06 DIAGNOSIS — E1165 Type 2 diabetes mellitus with hyperglycemia: Secondary | ICD-10-CM

## 2022-11-06 DIAGNOSIS — E782 Mixed hyperlipidemia: Secondary | ICD-10-CM

## 2022-11-06 DIAGNOSIS — E039 Hypothyroidism, unspecified: Secondary | ICD-10-CM | POA: Diagnosis not present

## 2022-11-06 LAB — POCT GLYCOSYLATED HEMOGLOBIN (HGB A1C)

## 2022-11-06 MED ORDER — EMPAGLIFLOZIN 10 MG PO TABS: 10.0000 mg | ORAL_TABLET | Freq: Every day | ORAL | 1 refills | Status: DC

## 2022-11-06 NOTE — Progress Notes (Signed)
11/06/2022, 1:27 PM  Endocrinology follow-up note   Subjective:    Patient ID: Jonathan Hensley, male    DOB: 1941-02-24.  Jonathan Hensley is being seen in follow-up for the management of currently uncontrolled type 2 diabetes, hypothyroidism, hypertension, hyperlipidemia.   Patient was previously seen by Dr. Lucianne Muss for endocrinology care.  Past Medical History:  Diagnosis Date   Arthritis    rheumatoid in his hands   Atrial fibrillation (HCC)    Bulging lumbar disc    Cancer (HCC)    prostate   DDD (degenerative disc disease), cervical    Dementia (HCC)    Diabetes mellitus    Diabetic retinopathy (HCC)    Double vision    Dysrhythmia    Atrial Fibrillation   History of prostate cancer    Hypertension    Low back pain    Nephrolithiasis    Sleep apnea    uses cpap    Past Surgical History:  Procedure Laterality Date   APPENDECTOMY     BACK SURGERY     Bilateral L4-5 lumbar decompression including bilateral  11/2008   brachytherapy     COLONOSCOPY     CYSTOSCOPY     Left L4-L5 lumbar laminotomy and microdiskectomy with  07/14/2008     LITHOTRIPSY     LUMBAR LAMINECTOMY/DECOMPRESSION MICRODISCECTOMY Left 08/18/2013   Procedure: Left Lumbar Five-Sacral One Microdiskectomy;  Surgeon: Hewitt Shorts, MD;  Location: [facility];  Service: Neurosurgery;  Laterality: Left;  Left Lumbar Five-Sacral One Microdiskectomy   rotator cuff surgery     Scleral buckle, laser photocoagulation, and anterior chamber  01/04/2009    Social History   Socioeconomic History   Marital status: Married    Spouse name: Britta Mccreedy   Number of children: 2   Years of education: 12   Highest education level: Not on file  Occupational History   Not on file  Tobacco Use   Smoking status: Former    Types: Cigarettes    Quit date: 02/12/1973    Years since quitting: 49.7   Smokeless tobacco: Never   Tobacco  comments:    starting at age 8  Vaping Use   Vaping Use: Never used  Substance and Sexual Activity   Alcohol use: No   Drug use: No   Sexual activity: Yes  Other Topics Concern   Not on file  Social History Narrative   Lives at home with wife   Drinks 4 cups of coffee a day   Social Determinants of Health   Financial Resource Strain: Not on file  Food Insecurity: Not on file  Transportation Needs: Not on file  Physical Activity: Not on file  Stress: Not on file  Social Connections: Not on file    Family History  Problem Relation Age of Onset   Heart failure Father    Heart failure Mother    Heart failure Brother        Half brother.   Colon cancer Neg Hx     Outpatient Encounter Medications as of 11/06/2022  Medication Sig   empagliflozin (JARDIANCE)  10 MG TABS tablet Take 1 tablet (10 mg total) by mouth daily before breakfast.   Accu-Chek FastClix Lancets MISC Use to check blood glucose twice daily   atorvastatin (LIPITOR) 10 MG tablet TAKE 1 TABLET DAILY   B-D ULTRAFINE III SHORT PEN 31G X 8 MM MISC USE 1 PEN NEEDLE AS DIRECTED   Blood Glucose Monitoring Suppl (FREESTYLE FREEDOM LITE) w/Device KIT 1 each by Does not apply route 4 (four) times daily. USE BLOOD GLUCOSE MONITOR TO CHECK BLOOD SUGAR.   cyanocobalamin (VITAMIN B12) 1000 MCG tablet Take 1 tablet (1,000 mcg total) by mouth daily.   diltiazem (CARDIZEM CD) 240 MG 24 hr capsule TAKE 1 CAPSULE DAILY   ELIQUIS 5 MG TABS tablet TAKE 1 TABLET TWICE A DAY   ezetimibe (ZETIA) 10 MG tablet TAKE 1 TABLET DAILY   GLIPIZIDE XL 5 MG 24 hr tablet TAKE 1 TABLET DAILY WITH BREAKFAST   glucose blood (FREESTYLE LITE) test strip USE TO CHECK BLOOD SUGAR THREE TIMES A DAY AS INSTRUCTED   insulin glargine (LANTUS SOLOSTAR) 100 UNIT/ML Solostar Pen Inject 30 Units into the skin at bedtime.   Lancets (FREESTYLE) lancets Use as instructed to check blood sugar 4 times per day dx code 250.00   Lancets Thin MISC 1 each by Does not  apply route 3 (three) times daily.   lidocaine (LIDODERM) 5 % Place 1 patch onto the skin daily. Remove & Discard patch within 12 hours or as directed by MD   memantine (NAMENDA) 10 MG tablet Take 1 tablet (10 mg total) by mouth daily.   metoprolol succinate (TOPROL-XL) 25 MG 24 hr tablet TAKE 1 TABLET DAILY WITH OR IMMEDIATELY FOLLOWING A MEAL   ondansetron (ZOFRAN) 4 MG tablet Take 1 tablet (4 mg total) by mouth every 6 (six) hours as needed for nausea or vomiting. (Patient not taking: Reported on 08/28/2022)   oxyCODONE-acetaminophen (PERCOCET/ROXICET) 5-325 MG tablet Take 1 tablet by mouth every 6 (six) hours as needed for severe pain.   SYNTHROID 25 MCG tablet TAKE 1 TABLET DAILY BEFORE BREAKFAST   No facility-administered encounter medications on file as of 11/06/2022.    ALLERGIES: Allergies  Allergen Reactions   Beta Adrenergic Blockers Other (See Comments)    Severe dizziness   Other Other (See Comments)    Beta or alpha blockers: severe dizziness   Tramadol Other (See Comments)    Dizziness     VACCINATION STATUS: Immunization History  Administered Date(s) Administered   Influenza Split 02/12/2013   Influenza, High Dose Seasonal PF 03/26/2018   Influenza,inj,Quad PF,6+ Mos 02/26/2015   Influenza-Unspecified 02/17/2014   Pneumococcal Conjugate-13 11/26/2014   Tdap 07/21/2012    Diabetes He presents for his follow-up diabetic visit. He has type 2 diabetes mellitus. Onset time: He was diagnosed at approximate age of 48 years. His disease course has been worsening. There are no hypoglycemic associated symptoms. Pertinent negatives for hypoglycemia include no confusion, headaches, pallor or seizures. Pertinent negatives for diabetes include no chest pain, no fatigue, no polydipsia, no polyphagia, no polyuria and no weakness. There are no hypoglycemic complications. Symptoms are improving. Diabetic complications include nephropathy and retinopathy. Risk factors for coronary artery  disease include dyslipidemia, family history, male sex, sedentary lifestyle and tobacco exposure. Current diabetic treatment includes insulin injections and oral agent (monotherapy) (He is currently on Lantus 44 units nightly, glipizide 5 mg daily.). He is compliant with treatment most of the time. His weight is increasing steadily. He is following a generally unhealthy  diet. When asked about meal planning, he reported none. He has not had a previous visit with a dietitian. He participates in exercise intermittently. His home blood glucose trend is fluctuating minimally. His bedtime blood glucose range is generally >200 mg/dl. His overall blood glucose range is >200 mg/dl. (Patient is experiencing worsening cognitive decline.  He is being cared for by his  elderly overwhelmed wife.  His point-of-care A1c today is improving to 8.5% from 9.1%.  He did not tolerate higher dose of Lantus, was lowered to 30 units in the interim.  -He presents with near target fasting glycemic profile, however significantly above target postprandial glycemic profile.  The family reports his average glucose readings are above 200 mg/dl. -  however he does not always  monitor blood glucose in the morning since he starts eating at 4 AM.     ) An ACE inhibitor/angiotensin II receptor blocker is being taken. Eye exam is current.  Hyperlipidemia This is a chronic problem. The current episode started more than 1 year ago. The problem is controlled. Exacerbating diseases include diabetes and obesity. Pertinent negatives include no chest pain, myalgias or shortness of breath. Current antihyperlipidemic treatment includes statins. Risk factors for coronary artery disease include diabetes mellitus, dyslipidemia, hypertension, male sex and a sedentary lifestyle.  Hypertension This is a chronic problem. The current episode started more than 1 year ago. Pertinent negatives include no chest pain, headaches, neck pain, palpitations or shortness  of breath. Risk factors for coronary artery disease include dyslipidemia, diabetes mellitus, male gender, obesity, sedentary lifestyle and smoking/tobacco exposure. Past treatments include ACE inhibitors. Hypertensive end-organ damage includes retinopathy.     Review of systems  Constitutional: + Minimally fluctuating body weight,  current  Body mass index is 30.19 kg/m. , no fatigue, no subjective hyperthermia, no subjective hypothermia     Objective:    BP 123/84   Pulse 76   Ht 5\' 10"  (1.778 m)   Wt 210 lb 6.4 oz (95.4 kg)   BMI 30.19 kg/m   Wt Readings from Last 3 Encounters:  11/06/22 210 lb 6.4 oz (95.4 kg)  08/10/22 213 lb (96.6 kg)  07/27/22 213 lb (96.6 kg)    Physical Exam- Limited  Constitutional:  Body mass index is 30.19 kg/m. , not in acute distress, normal state of mind    CMP     Component Value Date/Time   NA 136 (A) 03/15/2022 0000   K 4.9 03/15/2022 0000   CL 101 03/15/2022 0000   CO2 25 (A) 03/15/2022 0000   GLUCOSE 183 (H) 03/13/2022 1336   BUN 30 (A) 03/15/2022 0000   CREATININE 2.1 (A) 03/15/2022 0000   CREATININE 1.85 (H) 03/13/2022 1336   CREATININE 1.50 (H) 03/25/2020 0752   CALCIUM 9.3 03/15/2022 0000   PROT 8.2 (H) 03/13/2022 1336   PROT 6.7 01/19/2022 0845   ALBUMIN 3.9 03/15/2022 0000   ALBUMIN 4.0 01/19/2022 0845   AST 11 (A) 03/15/2022 0000   ALT 10 03/15/2022 0000   ALKPHOS 62 03/15/2022 0000   BILITOT 1.0 03/13/2022 1336   BILITOT 0.4 01/19/2022 0845   GFRNONAA 36 (L) 03/13/2022 1336   GFRNONAA 44 (L) 03/25/2020 0752   GFRAA 51 (L) 03/25/2020 0752     Diabetic Labs (most recent): Lab Results  Component Value Date   HGBA1C 9.1 07/21/2022   HGBA1C 8.5 (A) 01/26/2022   HGBA1C 8.7 (A) 09/08/2021   MICROALBUR 0.4 09/16/2019   MICROALBUR 0.4 09/30/2018   MICROALBUR 0.8 09/20/2017  Lipid Panel ( most recent) Lipid Panel     Component Value Date/Time   CHOL 121 03/15/2022 0000   CHOL 130 01/19/2022 0845   TRIG  66 03/15/2022 0000   HDL 55 03/14/2022 0000   HDL 51 01/19/2022 0845   CHOLHDL 2.5 01/19/2022 0845   CHOLHDL 2.8 02/11/2020 1000   VLDL 12 02/11/2020 1000   LDLCALC 51 03/15/2022 0000   LDLCALC 66 01/19/2022 0845   LDLCALC 130 (H) 09/16/2019 0809      Lab Results  Component Value Date   TSH 6.43 (A) 03/15/2022   TSH 2.698 01/28/2022   TSH 3.500 01/19/2022   TSH 3.870 06/02/2021   TSH 5.410 (H) 10/01/2020   TSH 5.54 (H) 03/25/2020   TSH 3.70 09/16/2019   TSH 3.05 01/14/2019   TSH 4.80 (H) 09/30/2018   TSH 3.27 12/21/2017   FREET4 1.32 01/19/2022   FREET4 1.24 06/02/2021   FREET4 1.15 10/01/2020   FREET4 1.0 03/25/2020   FREET4 1.0 09/16/2019   FREET4 1.2 01/14/2019   FREET4 0.9 09/30/2018   FREET4 0.92 06/11/2017   FREET4 0.95 05/22/2016   FREET4 0.92 06/16/2015      Assessment & Plan:   1. Uncontrolled type 2 diabetes mellitus with hyperglycemia (HCC)  - Jonathan Hensley has currently uncontrolled symptomatic type 2 DM since  82 years of age.  Patient is experiencing worsening cognitive decline.  He is being cared for by his  elderly overwhelmed wife.  His point-of-care A1c today is improving to 8.5% from 9.1%.  He did not tolerate higher dose of Lantus, was lowered to 30 units in the interim.  -He presents with near target fasting glycemic profile, however significantly above target postprandial glycemic profile.  The family reports his average glucose readings are above 200 mg/dl. -  however he does not always  monitor blood glucose in the morning since he starts eating at 4 AM.    -his diabetes is complicated by retinopathy and he remains at a high risk for more acute and chronic complications which include CAD, CVA, CKD, retinopathy, and neuropathy. These are all discussed in detail with him.  - I have counseled him on diet management and weight loss, by adopting a carbohydrate restricted/protein rich diet.   - he acknowledges that there is a room for improvement  in his food and drink choices. - Suggestion is made for him to avoid simple carbohydrates  from his diet including Cakes, Sweet Desserts, Ice Cream, Soda (diet and regular), Sweet Tea, Candies, Chips, Cookies, Store Bought Juices, Alcohol , Artificial Sweeteners,  Coffee Creamer, and "Sugar-free" Products, Lemonade. This will help patient to have more stable blood glucose profile and potentially avoid unintended weight gain.  The following Lifestyle Medicine recommendations according to American College of Lifestyle Medicine  [facility]) were discussed and and offered to patient and he  agrees to start the journey:  A. Whole Foods, Plant-Based Nutrition comprising of fruits and vegetables, plant-based proteins, whole-grain carbohydrates was discussed in detail with the patient.   A list for source of those nutrients were also provided to the patient.  Patient will use only water or unsweetened tea for hydration. B.  The need to stay away from risky substances including alcohol, smoking; obtaining 7 to 9 hours of restorative sleep, at least 150 minutes of moderate intensity exercise weekly, the importance of healthy social connections,  and stress management techniques were discussed. - he is advised to stick to a routine mealtimes to eat  3 meals  a day and avoid unnecessary snacks ( to snack only to correct hypoglycemia).   - I have approached him with the following individualized plan to manage diabetes and patient agrees:    -Priority in this patient is to avoid hypoglycemia which the family or is about to get   He does not monitor blood glucose in the morning due to the fact that he starts eating at 4 AM.  His wife is not awake at that time to check for him.  He is hesitant but advised to continue Lantus 30 units nightly, advised to monitor blood glucose at least twice a day-daily before breakfast and at bedtime.    Advised to continue glipizide 5 mg daily with breakfast.  His care is assisted and   coordinated by his wife. -In hopes of avoiding multiple daily injections of insulin, he is considered for low-dose Jardiance.  I discussed and initiated Jardiance 10 mg p.o. daily at breakfast.  Side effects and precautions discussed with - it will not be practical to provide multiple daily injections of insulin regimen to this patient without adequate social support at home. He would have benefited from a CGM, however he has been hesitant to get one. - Patient specific target  A1c;  LDL, HDL, Triglycerides,  were discussed in detail.  2) Blood Pressure /Hypertension: -His blood pressure is controlled to target.  he is advised to continue his current medications including benazepril 40 mg p.o. daily with breakfast.  3) Lipids/Hyperlipidemia: Review of his recent lipid panel show improved LDL at 66, improving from 161.  He is advised to continue simvastatin 40 mg p.o. daily at bedtime.     Also on Zetia 10 mg p.o. nightly.     Side effects and precautions discussed with him.  4)  Weight/Diet: His BMI is 30.19.   Exercise, and detailed carbohydrates information provided  -  detailed on discharge instructions.   5) Hypothyroidism:  His recent thyroid function tests were consistent with appropriate replacement.  He is advised to continue Synthroid 25 mcg p.o. daily before breakfast.   - We discussed about the correct intake of his thyroid hormone, on empty stomach at fasting, with water, separated by at least 30 minutes from breakfast and other medications,  and separated by more than 4 hours from calcium, iron, multivitamins, acid reflux medications (PPIs). -Patient is made aware of the fact that thyroid hormone replacement is needed for life, dose to be adjusted by periodic monitoring of thyroid function tests.    6) Chronic Care/Health Maintenance:  -he  is on ACEI/ARB and Statin medications and  is encouraged to initiate and continue to follow up with Ophthalmology, Dentist,  Podiatrist  at least yearly or according to recommendations, and advised to   stay away from smoking. I have recommended yearly flu vaccine and pneumonia vaccine at least every 5 years; moderate intensity exercise for up to 150 minutes weekly; and  sleep for at least 7 hours a day.  Family reports that they are moving to [place] in hopes of getting help from other family members.  He has documents can be electronically  transferred once he establishes local endocrinology care.   - he is  advised to maintain close follow up with his PMD  for primary care needs, as well as his other providers for optimal and coordinated care.    I spent  26  minutes in the care of the patient today including review of labs from CMP, Lipids,  Thyroid Function, Hematology (current and previous including abstractions from other facilities); face-to-face time discussing  his blood glucose readings/logs, discussing hypoglycemia and hyperglycemia episodes and symptoms, medications doses, his options of short and long term treatment based on the latest standards of care / guidelines;  discussion about incorporating lifestyle medicine;  and documenting the encounter. Risk reduction counseling performed per USPSTF guidelines to reduce  obesity and cardiovascular risk factors.     Please refer to Patient Instructions for Blood Glucose Monitoring and Insulin/Medications Dosing Guide"  in media tab for additional information. Please  also refer to " Patient Self Inventory" in the Media  tab for reviewed elements of pertinent patient history.  Jonathan Hensley participated in the discussions, expressed understanding, and voiced agreement with the above plans.  All questions were answered to his satisfaction. he is encouraged to contact clinic should he have any questions or concerns prior to his return visit.     Follow up plan: - Return if symptoms worsen or fail to improve.  Marquis Lunch, MD [facility] [address] [place], [place] [address] Phone: [phone]  Fax: [phone]    11/06/2022, 1:27 PM  This note was partially dictated with voice recognition software. Similar sounding words can be transcribed inadequately or may not  be corrected upon review.

## 2022-12-27 NOTE — Telephone Encounter (Signed)
Return in about 6 months (around 06/29/2023) for CAD, Hyperlipidemia, Syuncope.

## 2023-01-02 ENCOUNTER — Telehealth: Payer: Self-pay | Admitting: "Endocrinology

## 2023-01-02 NOTE — Telephone Encounter (Signed)
Received medical records request from [place]. I have faxed this to [facility] to release his records

## 2023-03-16 DEATH — deceased

## 2023-06-28 ENCOUNTER — Ambulatory Visit: Payer: Medicare Other | Admitting: Cardiology
